# Patient Record
Sex: Male | Born: 1985 | Race: Black or African American | Hispanic: No | Marital: Single | Smoking: Former smoker
Health system: Southern US, Community
[De-identification: ages and names within clinical notes are randomized; demographics above are authoritative.]

## PROBLEM LIST (undated history)

## (undated) DIAGNOSIS — IMO0001 Reserved for inherently not codable concepts without codable children: Secondary | ICD-10-CM

## (undated) DIAGNOSIS — Z789 Other specified health status: Secondary | ICD-10-CM

## (undated) DIAGNOSIS — M5430 Sciatica, unspecified side: Secondary | ICD-10-CM

## (undated) DIAGNOSIS — F17200 Nicotine dependence, unspecified, uncomplicated: Secondary | ICD-10-CM

## (undated) DIAGNOSIS — F1090 Alcohol use, unspecified, uncomplicated: Secondary | ICD-10-CM

## (undated) HISTORY — PX: INGUINAL HERNIA REPAIR: SUR1180

## (undated) MED FILL — Dexamethasone Sodium Phosphate Inj 100 MG/10ML: INTRAMUSCULAR | Qty: 2 | Status: AC

---

## 2011-10-12 ENCOUNTER — Ambulatory Visit: Payer: Self-pay | Admitting: Radiology

## 2011-10-12 LAB — BASIC METABOLIC PANEL
Anion Gap: 8 (ref 7–16)
BUN: 6 mg/dL — ABNORMAL LOW (ref 7–18)
Co2: 28 mmol/L (ref 21–32)
Creatinine: 0.86 mg/dL (ref 0.60–1.30)
EGFR (African American): >60
EGFR (Non-African Amer.): >60
Glucose: 92 mg/dL (ref 65–99)
Osmolality: 277 (ref 275–301)
Potassium: 3.1 mmol/L — ABNORMAL LOW (ref 3.5–5.1)
Sodium: 140 mmol/L (ref 136–145)

## 2011-10-12 LAB — CBC WITH DIFFERENTIAL/PLATELET
Basophil %: 0.7 %
Eosinophil #: 0.2 10*3/uL (ref 0.0–0.7)
Eosinophil %: 3.4 %
HCT: 42.3 % (ref 40.0–52.0)
HGB: 14 g/dL (ref 13.0–18.0)
Lymphocyte %: 38.4 %
MCHC: 33.2 g/dL (ref 32.0–36.0)
MCV: 76 fL — ABNORMAL LOW (ref 80–100)
Monocyte %: 10.8 %
Neutrophil #: 2.9 10*3/uL (ref 1.4–6.5)
Neutrophil %: 46.7 %
WBC: 6.1 10*3/uL (ref 3.8–10.6)

## 2011-10-13 ENCOUNTER — Ambulatory Visit: Payer: Self-pay | Admitting: Surgery

## 2014-04-19 ENCOUNTER — Ambulatory Visit
Admit: 2014-04-19 | Disposition: A | Discharge status: Home / Self Care | Payer: Self-pay | Attending: Family Medicine | Admitting: Family Medicine

## 2014-04-24 NOTE — Op Note (Signed)
PATIENT NAME:  Tanner [name], Tanner [name] MR#:  [id number] DATE OF BIRTH:  April 07, 1985  DATE OF PROCEDURE:  10/13/2011  PREOPERATIVE DIAGNOSIS: Symptomatic right inguinal hernia.   POSTOPERATIVE DIAGNOSIS: Symptomatic right inguinal hernia.   PROCEDURE: Laparoscopic preperitoneal repair of a right inguinal hernia.   SURGEON: Phoebe Perch, MD  ASSISTANT: Marlyce Huge, MD   ANESTHESIA: General with endotracheal tube.   INDICATIONS: This is a patient with a large right inguinal hernia. Preoperatively we discussed rationale for surgery, the options of observation, risks of bleeding, infection, recurrence, ischemic orchitis, chronic pain syndrome, neuroma, and seroma, as well as hematoma. This was all reviewed for he and his mother in the preop holding area. They understood and agreed to proceed.   FINDINGS: Large long-standing indirect sac extending down into the scrotum, strong floor.   DESCRIPTION OF PROCEDURE: The patient was induced to general anesthesia, given IV antibiotics. He was prepped and draped in a sterile fashion. Marcaine was infiltrated in skin and subcutaneous tissues around the periumbilical area. An incision was made and dissection down to the right rectus fascia was performed. It was incised and the muscle retracted laterally. The [place] surgical dissecting balloon was placed followed by the [place] surgical structural balloon and then the preperitoneal space was insufflated under direct vision. Two midline 5 mm ports were placed. Shamar Engelmann's ligament was delineated. The lateral extent of dissection was determined. The cord was skeletonized of a very large and long-standing indirect sac, which was retracted cephalad and then into the preperitoneal space was placed a laterally scissored Atrium mesh held in place with the [place] surgical tacker avoiding the area of the nerve. The preperitoneal space was then desufflated under direct vision. All ports were removed. Fascial edges were approximated  with 0 Vicryl and 4-0 subcuticular Monocryl was used at all skin edges. Steri-Strips, Mastisol, and sterile dressings were placed.   The patient tolerated the procedure well. There were no complications. He was taken to the recovery room in stable condition to be discharged in the care of his family with followup in 10 days. ____________________________ Jerrol Banana Burt Knack, MD rec:slb D: 10/13/2011 12:51:57 ET T: 10/13/2011 12:57:26 ET JOB#: [id number]  cc: Jerrol Banana. Burt Knack, MD, <Dictator> Florene Glen MD ELECTRONICALLY SIGNED 10/13/2011 16:12

## 2014-04-24 NOTE — H&P (Signed)
PATIENT NAME:  Tanner [name], Tanner [name] MR#:  [id number] DATE OF BIRTH:  1985/03/30  DATE OF ADMISSION:  10/13/2011  CHIEF COMPLAINT: Right inguinal hernia.   HISTORY OF PRESENT ILLNESS: This is a patient with a history of bulge and some pain on the right side. No left-sided symptoms. No nausea, vomiting, fevers, or chills to suggest a history of incarceration. His pain and bulge has been present for approximately one year. He is here for elective laparoscopic preperitoneal repair of a right inguinal hernia.   PAST MEDICAL HISTORY: None.   PAST SURGICAL HISTORY: None.   MEDICATIONS: None.   ALLERGIES: None.   SOCIAL HISTORY: Patient drinks alcohol periodically, smokes less than a pack of cigarettes per day and is currently unemployed.   REVIEW OF SYSTEMS: Has been performed and documented in the chart; negative with the exception of that mentioned in the history of present illness.   PHYSICAL EXAMINATION:  GENERAL: Healthy muscular male patient.   HEENT: No scleral icterus.   NECK: No palpable neck nodes.   CHEST: Clear to auscultation.   CARDIAC: Regular rate and rhythm.   ABDOMEN: Soft, nontender.   EXTREMITIES: Without edema.   NEUROLOGIC: Grossly intact.   INTEGUMENT: No jaundice.   GENITOURINARY: Right inguinal hernia which is reducible and nontender.   LABORATORY, DIAGNOSTIC, AND RADIOLOGICAL DATA: Potassium 3.1 which has been replaced by oral 30 mEq. Hemoglobin and hematocrit is 14 and 42.   ASSESSMENT AND PLAN: This is a patient with a symptomatic right inguinal hernia requiring repair. Preoperatively we discussed rationale for surgery, the options of observation, the risks of bleeding, infection, recurrence, ischemic orchitis, chronic pain, syndrome and neuroma. This was all been reviewed for him and will be reviewed again in the preop holding area. He understands and agrees to proceed [place].  ____________________________ Jerrol Banana. Burt Knack,  MD rec:cms D: 10/12/2011 00:37:04 ET T: 10/13/2011 07:06:24 ET JOB#: [id number]  cc: Jerrol Banana. Burt Knack, MD, <Dictator>  Florene Glen MD ELECTRONICALLY SIGNED 10/13/2011 16:13

## 2016-01-07 ENCOUNTER — Emergency Department
Admission: EM | Admit: 2016-01-07 | Discharge: 2016-01-07 | Disposition: A | Discharge status: Home / Self Care | Payer: Self-pay | Attending: Emergency Medicine | Admitting: Emergency Medicine

## 2016-01-07 ENCOUNTER — Encounter: Payer: Self-pay | Admitting: *Deleted

## 2016-01-07 DIAGNOSIS — J069 Acute upper respiratory infection, unspecified: Secondary | ICD-10-CM | POA: Insufficient documentation

## 2016-01-07 DIAGNOSIS — B9789 Other viral agents as the cause of diseases classified elsewhere: Secondary | ICD-10-CM

## 2016-01-07 LAB — POCT RAPID STREP A: Streptococcus, Group A Screen (Direct): NEGATIVE

## 2016-01-07 MED ORDER — PSEUDOEPH-BROMPHEN-DM 30-2-10 MG/5ML PO SYRP: 10.0000 mL | ORAL_SOLUTION | Freq: Four times a day (QID) | ORAL | 0 refills | Status: DC | PRN

## 2016-01-07 MED ORDER — FLUTICASONE PROPIONATE 50 MCG/ACT NA SUSP: 1.0000 | Freq: Two times a day (BID) | NASAL | 0 refills | Status: DC

## 2016-01-07 MED ORDER — CETIRIZINE HCL 10 MG PO TABS: 10.0000 mg | ORAL_TABLET | Freq: Every day | ORAL | 0 refills | Status: DC

## 2016-01-07 NOTE — ED Provider Notes (Signed)
[facility] Emergency Department Provider Note  ____________________________________________  Time seen: Approximately 7:15 PM  I have reviewed the triage vital signs and the nursing notes.   HISTORY  Chief Complaint Cough and Sore Throat    HPI Tanner [name] is a 31 y.o. male who presents emergency department complaining of nasal congestion, sore throat, cough, diarrhea 7 days. Patient reports that symptoms began insidiously with some nasal congestion and diarrhea. Patient reports after 2 days diarrhea completely resolved. Patient states that the nasal congestion has also had sore throat and coughing. Patient denies any frank fevers. He denies any emesis or nausea. Patient denies any headache, visual changes, neck stiffness, chest pain, shortness of breath. Patient is tried 2-3 doses of over-the-counter cold and cough medication with moderate relief. No other symptoms or complaints. No other medications prior to arrival.   History reviewed. No pertinent past medical history.  There are no active problems to display for this patient.   History reviewed. No pertinent surgical history.  Prior to Admission medications   Medication Sig Start Date End Date Taking? Authorizing Provider  brompheniramine-pseudoephedrine-DM 30-2-10 MG/5ML syrup Take 10 mLs by mouth 4 (four) times daily as needed. 01/07/16   Charline Bills Cuthriell, PA-C  cetirizine (ZYRTEC) 10 MG tablet Take 1 tablet (10 mg total) by mouth daily. 01/07/16   Charline Bills Cuthriell, PA-C  fluticasone (FLONASE) 50 MCG/ACT nasal spray Place 1 spray into both nostrils 2 (two) times daily. 01/07/16   Charline Bills Cuthriell, PA-C    Allergies Patient has no known allergies.  No family history on file.  Social History Social History  Substance Use Topics  . Smoking status: Never Smoker  . Smokeless tobacco: Never Used  . Alcohol use No     Review of Systems  Constitutional: No  fever/chills Eyes: No visual changes. No discharge ENT: Positive for nasal congestion and sore throat  Cardiovascular: no chest pain. Respiratory: Positive cough. No SOB. Gastrointestinal: No abdominal pain.  No nausea, no vomiting.  Positive for diarrhea that has resolved.  No constipation. Musculoskeletal: Negative for musculoskeletal pain. Skin: Negative for rash, abrasions, lacerations, ecchymosis. Neurological: Negative for headaches, focal weakness or numbness. 10-point ROS otherwise negative.  ____________________________________________   PHYSICAL EXAM:  VITAL SIGNS: ED Triage Vitals  Enc Vitals Group     BP 01/07/16 1832 (!) 142/82     Pulse Rate 01/07/16 1832 (!) 101     Resp --      Temp 01/07/16 1832 98.6 F (37 C)     Temp Source 01/07/16 1832 Oral     SpO2 01/07/16 1832 100 %     Weight 01/07/16 1834 270 lb (122.5 kg)     Height 01/07/16 1834 6' (1.829 m)     Head Circumference --      Peak Flow --      Pain Score 01/07/16 1834 5     Pain Loc --      Pain Edu? --      Excl. in [place]? --      Constitutional: Alert and oriented. Well appearing and in no acute distress. Eyes: Conjunctivae are normal. PERRL. EOMI. Head: Atraumatic. ENT:      Ears: EACs and TMs unremarkable bilaterally      Nose: Moderate clear congestion/rhinnorhea.      Mouth/Throat: Mucous membranes are moist. Oropharynx is mildly erythematous but nonedematous. Tonsils are mildly edematous but nonerythematous and no exudates present. Uvula is midline Neck: No stridor. Neck is supple  with full range of motion Hematological/Lymphatic/Immunilogical: No cervical lymphadenopathy. Cardiovascular: Normal rate, regular rhythm. Normal S1 and S2.  Good peripheral circulation. Respiratory: Normal respiratory effort without tachypnea or retractions. Lungs CTAB. Good air entry to the bases with no decreased or absent breath sounds. Gastrointestinal: Bowel sounds 4 quadrants. Soft and nontender to  palpation. No guarding or rigidity. No palpable masses. No distention.  Musculoskeletal: Full range of motion to all extremities. No gross deformities appreciated. Neurologic:  Normal speech and language. No gross focal neurologic deficits are appreciated.  Skin:  Skin is warm, dry and intact. No rash noted. Psychiatric: Mood and affect are normal. Speech and behavior are normal. Patient exhibits appropriate insight and judgement.   ____________________________________________   LABS (all labs ordered are listed, but only abnormal results are displayed)  Labs Reviewed  POCT RAPID STREP A   ____________________________________________  EKG   ____________________________________________  RADIOLOGY   No results found.  ____________________________________________    PROCEDURES  Procedure(s) performed:    Procedures    Medications - No data to display   ____________________________________________   INITIAL IMPRESSION / ASSESSMENT AND PLAN / ED COURSE  Pertinent labs & imaging results that were available during my care of the patient were reviewed by me and considered in my medical decision making (see chart for details).  Review of the [place] CSRS was performed in accordance of the [place] prior to dispensing any controlled drugs.  Clinical Course     Patient's diagnosis is consistent with Viral upper respiratory infection with cough. This time, patient's symptoms are consistent with viral infection and no imaging or labs or edema necessary.. Patient will be discharged home with prescriptions for symptom control medications to include Flonase, Zyrtec, cough medication. Patient is to follow up with primary care as needed or otherwise directed. Patient is given ED precautions to return to the ED for any worsening or new symptoms.     ____________________________________________  FINAL CLINICAL IMPRESSION(S) / ED DIAGNOSES  Final diagnoses:  Viral URI with cough       NEW MEDICATIONS STARTED DURING THIS VISIT:  Discharge Medication List as of 01/07/2016  7:15 PM    START taking these medications   Details  brompheniramine-pseudoephedrine-DM 30-2-10 MG/5ML syrup Take 10 mLs by mouth 4 (four) times daily as needed., Starting Tue 01/07/2016, Print    cetirizine (ZYRTEC) 10 MG tablet Take 1 tablet (10 mg total) by mouth daily., Starting Tue 01/07/2016, Print    fluticasone (FLONASE) 50 MCG/ACT nasal spray Place 1 spray into both nostrils 2 (two) times daily., Starting Tue 01/07/2016, Print            This chart was dictated using voice recognition software/Dragon. Despite best efforts to proofread, errors can occur which can change the meaning. Any change was purely unintentional.    Darletta Moll, PA-C 01/07/16 2038    Nena Polio, MD 01/07/16 [phone]

## 2016-01-07 NOTE — ED Notes (Signed)
Presents with sinus pressure and sore throat  States he developed some diarrhea with body aches about 5 days ago  Denies any diarrhea at present  But now having increased sinus pressure with sore throat  Afebrile on arrival

## 2016-01-07 NOTE — ED Notes (Signed)
POCT Rapid Strep A sent for analysis.

## 2016-01-07 NOTE — ED Triage Notes (Signed)
Pt has a cough, sore throat and sinus pressure.  Pt alert.

## 2016-03-30 ENCOUNTER — Encounter: Payer: Self-pay | Admitting: *Deleted

## 2016-03-30 ENCOUNTER — Emergency Department
Admission: EM | Admit: 2016-03-30 | Discharge: 2016-03-30 | Disposition: A | Discharge status: Home / Self Care | Payer: Self-pay | Attending: Emergency Medicine | Admitting: Emergency Medicine

## 2016-03-30 DIAGNOSIS — J069 Acute upper respiratory infection, unspecified: Secondary | ICD-10-CM | POA: Insufficient documentation

## 2016-03-30 DIAGNOSIS — B9789 Other viral agents as the cause of diseases classified elsewhere: Secondary | ICD-10-CM

## 2016-03-30 MED ORDER — PSEUDOEPH-BROMPHEN-DM 30-2-10 MG/5ML PO SYRP: 5.0000 mL | ORAL_SOLUTION | Freq: Four times a day (QID) | ORAL | 0 refills | Status: DC | PRN

## 2016-03-30 MED ORDER — IBUPROFEN 600 MG PO TABS: 600.0000 mg | ORAL_TABLET | Freq: Three times a day (TID) | ORAL | 0 refills | Status: DC | PRN

## 2016-03-30 NOTE — ED Notes (Signed)
See triage note  States he developed a headache with slight cough last pm  Describes headaches as throbbing headache  Did have relief with Aleve last pm  Low grade fever on arrival

## 2016-03-30 NOTE — ED Provider Notes (Signed)
[facility] Emergency Department Provider Note   ____________________________________________   First MD Initiated Contact with Patient 03/30/16 [phone]     (approximate)  I have reviewed the triage vital signs and the nursing notes.   HISTORY  Chief Complaint Headache and Cough    HPI Tanner [name] is a 31 y.o. male patient complain of a frontal headache, body aches, nasal congestion, and cough which started yesterday. Patient stated to over-the-counter Aleve with some mild relief of the body aches. Patient states cough is nonproductive.Patient rates his pain as 8/10. Patient described his pain as "achy". No other palliative measures for his complaint.   History reviewed. No pertinent past medical history.  There are no active problems to display for this patient.   History reviewed. No pertinent surgical history.  Prior to Admission medications   Medication Sig Start Date End Date Taking? Authorizing Provider  brompheniramine-pseudoephedrine-DM 30-2-10 MG/5ML syrup Take 10 mLs by mouth 4 (four) times daily as needed. 01/07/16   Charline Bills Cuthriell, PA-C  brompheniramine-pseudoephedrine-DM 30-2-10 MG/5ML syrup Take 5 mLs by mouth 4 (four) times daily as needed. 03/30/16   Sable Feil, PA-C  cetirizine (ZYRTEC) 10 MG tablet Take 1 tablet (10 mg total) by mouth daily. 01/07/16   Charline Bills Cuthriell, PA-C  fluticasone (FLONASE) 50 MCG/ACT nasal spray Place 1 spray into both nostrils 2 (two) times daily. 01/07/16   Charline Bills Cuthriell, PA-C  ibuprofen (ADVIL,MOTRIN) 600 MG tablet Take 1 tablet (600 mg total) by mouth every 8 (eight) hours as needed. 03/30/16   Sable Feil, PA-C    Allergies Patient has no known allergies.  History reviewed. No pertinent family history.  Social History Social History  Substance Use Topics  . Smoking status: Never Smoker  . Smokeless tobacco: Never Used  . Alcohol use No    Review of  Systems Constitutional: Fever and body aches Eyes: No visual changes. ENT: No sore throat. Nasal congestion Cardiovascular: Denies chest pain. Respiratory: Denies shortness of breath. Nonproductive cough Gastrointestinal: No abdominal pain.  No nausea, no vomiting.  No diarrhea.  No constipation. Genitourinary: Negative for dysuria. Musculoskeletal: Negative for back pain. Skin: Negative for rash. Neurological: Negative for headaches, focal weakness or numbness.    ____________________________________________   PHYSICAL EXAM:  VITAL SIGNS: ED Triage Vitals  Enc Vitals Group     BP 03/30/16 0857 138/74     Pulse Rate 03/30/16 0857 (!) 104     Resp 03/30/16 0857 18     Temp 03/30/16 0857 (!) 100.5 F (38.1 C)     Temp Source 03/30/16 0857 Oral     SpO2 03/30/16 0857 95 %     Weight 03/30/16 0853 270 lb (122.5 kg)     Height 03/30/16 0853 6' (1.829 m)     Head Circumference --      Peak Flow --      Pain Score 03/30/16 0853 8     Pain Loc --      Pain Edu? --      Excl. in [place]? --     Constitutional: Alert and oriented. Well appearing and in no acute distress. Temperature 100.5 Eyes: Conjunctivae are normal. PERRL. EOMI. Head: Atraumatic. Nose:Edematous nasal turbinates clear rhinorrhea.  Mouth/Throat: Mucous membranes are moist.  Oropharynx non-erythematous. Postnasal drainage Neck: No stridor.  No cervical spine tenderness to palpation. Hematological/Lymphatic/Immunilogical: No cervical lymphadenopathy. Cardiovascular: Normal rate, regular rhythm. Grossly normal heart sounds.  Good peripheral circulation. Respiratory: Normal respiratory  effort.  No retractions. Lungs CTAB. Nonproductive cough Gastrointestinal: Soft and nontender. No distention. No abdominal bruits. No CVA tenderness. Musculoskeletal: No lower extremity tenderness nor edema.  No joint effusions. Neurologic:  Normal speech and language. No gross focal neurologic deficits are appreciated. No gait  instability. Skin:  Skin is warm, dry and intact. No rash noted. Psychiatric: Mood and affect are normal. Speech and behavior are normal.  ____________________________________________   LABS (all labs ordered are listed, but only abnormal results are displayed)  Labs Reviewed - No data to display ____________________________________________  EKG   ____________________________________________  RADIOLOGY   ____________________________________________   PROCEDURES  Procedure(s) performed: None  Procedures  Critical Care performed: No  ____________________________________________   INITIAL IMPRESSION / ASSESSMENT AND PLAN / ED COURSE  Pertinent labs & imaging results that were available during my care of the patient were reviewed by me and considered in my medical decision making (see chart for details).  Viral illness. Patient given discharge care instructions. Patient given prescriptions for but felt DM and ibuprofen. Patient given a work no. Patient [facility].      ____________________________________________   FINAL CLINICAL IMPRESSION(S) / ED DIAGNOSES  Final diagnoses:  Viral URI with cough      NEW MEDICATIONS STARTED DURING THIS VISIT:  New Prescriptions   BROMPHENIRAMINE-PSEUDOEPHEDRINE-DM 30-2-10 MG/5ML SYRUP    Take 5 mLs by mouth 4 (four) times daily as needed.   IBUPROFEN (ADVIL,MOTRIN) 600 MG TABLET    Take 1 tablet (600 mg total) by mouth every 8 (eight) hours as needed.     Note:  This document was prepared using Dragon voice recognition software and may include unintentional dictation errors.    Sable Feil, PA-C 03/30/16 0920    Orbie Pyo, MD 03/30/16 [phone]

## 2016-03-30 NOTE — ED Triage Notes (Signed)
States last night he developed flu like symptoms, headache and cough, states he took aleve OTC, awake and alert

## 2016-10-05 ENCOUNTER — Emergency Department: Payer: Self-pay

## 2016-10-05 ENCOUNTER — Emergency Department
Admission: EM | Admit: 2016-10-05 | Discharge: 2016-10-05 | Disposition: A | Discharge status: Home / Self Care | Payer: Self-pay | Attending: Emergency Medicine | Admitting: Emergency Medicine

## 2016-10-05 ENCOUNTER — Encounter: Payer: Self-pay | Admitting: Emergency Medicine

## 2016-10-05 DIAGNOSIS — Z79899 Other long term (current) drug therapy: Secondary | ICD-10-CM | POA: Insufficient documentation

## 2016-10-05 DIAGNOSIS — W01198A Fall on same level from slipping, tripping and stumbling with subsequent striking against other object, initial encounter: Secondary | ICD-10-CM | POA: Insufficient documentation

## 2016-10-05 DIAGNOSIS — Y929 Unspecified place or not applicable: Secondary | ICD-10-CM | POA: Insufficient documentation

## 2016-10-05 DIAGNOSIS — S63501A Unspecified sprain of right wrist, initial encounter: Secondary | ICD-10-CM | POA: Insufficient documentation

## 2016-10-05 DIAGNOSIS — Y999 Unspecified external cause status: Secondary | ICD-10-CM | POA: Insufficient documentation

## 2016-10-05 DIAGNOSIS — Y9389 Activity, other specified: Secondary | ICD-10-CM | POA: Insufficient documentation

## 2016-10-05 MED ORDER — IBUPROFEN 800 MG PO TABS
800.0000 mg | ORAL_TABLET | Freq: Once | ORAL | Status: AC
  Administered 2016-10-05: 800 mg via ORAL
  Filled 2016-10-05: qty 1

## 2016-10-05 MED ORDER — IBUPROFEN 800 MG PO TABS: 800.0000 mg | ORAL_TABLET | Freq: Three times a day (TID) | ORAL | 0 refills | Status: DC | PRN

## 2016-10-05 NOTE — Discharge Instructions (Signed)
Please take ibuprofen as prescribed. Wear Velcro wrist brace. Rest ice and elevate the wrist. Follow-up with orthopedics if no improvement in one week.

## 2016-10-05 NOTE — ED Triage Notes (Signed)
Pt reports fell today and braced self with right arm. Pt reports right arm pain. Pt presents with swelling to right hand, fingertips cool to touch, cap refill brisk, radial pulse palpated, sensation intact. Pt ambulatory to triage. No apparent distress.

## 2016-10-05 NOTE — ED Provider Notes (Signed)
[facility] Provider Note   CSN: [id number] Arrival date & time: 10/05/16  [place]     History   Chief Complaint Chief Complaint  Patient presents with  . Wrist Injury    HPI Tanner [name] is a 31 y.o. male presents to the emergency department for evaluation of right wrist injury. Just prior to arrival he fell on his outstretched right hand. Patient has wrist pain along the distal radial ulnar joint and radiocarpal joint. Pain is moderate. His 90 medications for pain. No numbness or tingling. Has some pain along the forearm, midshaft. Denies any other injury to his body. Follows mechanical.  HPI  History reviewed. No pertinent past medical history.  There are no active problems to display for this patient.   No past surgical history on file.     Home Medications    Prior to Admission medications   Medication Sig Start Date End Date Taking? Authorizing Provider  brompheniramine-pseudoephedrine-DM 30-2-10 MG/5ML syrup Take 10 mLs by mouth 4 (four) times daily as needed. 01/07/16   Cuthriell, Charline Bills, PA-C  brompheniramine-pseudoephedrine-DM 30-2-10 MG/5ML syrup Take 5 mLs by mouth 4 (four) times daily as needed. 03/30/16   Sable Feil, PA-C  cetirizine (ZYRTEC) 10 MG tablet Take 1 tablet (10 mg total) by mouth daily. 01/07/16   Cuthriell, Charline Bills, PA-C  fluticasone (FLONASE) 50 MCG/ACT nasal spray Place 1 spray into both nostrils 2 (two) times daily. 01/07/16   Cuthriell, Charline Bills, PA-C  ibuprofen (ADVIL,MOTRIN) 800 MG tablet Take 1 tablet (800 mg total) by mouth every 8 (eight) hours as needed. 10/05/16   Duanne Guess, PA-C    Family History No family history on file.  Social History Social History  Substance Use Topics  . Smoking status: Never Smoker  . Smokeless tobacco: Never Used  . Alcohol use No     Allergies   Patient has no known allergies.   Review of Systems Review of Systems  Constitutional: Negative for fever.    Respiratory: Negative for shortness of breath.   Cardiovascular: Negative for chest pain.  Gastrointestinal: Negative for abdominal pain.  Genitourinary: Negative for difficulty urinating, dysuria and urgency.  Musculoskeletal: Positive for arthralgias. Negative for back pain and myalgias.  Skin: Negative for rash.  Neurological: Negative for dizziness and headaches.     Physical Exam Updated Vital Signs BP (!) 143/99 (BP Location: Left Arm)   Pulse 91   Temp 98.9 F (37.2 C) (Oral)   Resp 18   Ht 6' (1.829 m)   Wt 92.5 kg (204 lb)   SpO2 100%   BMI 27.67 kg/m   Physical Exam  Constitutional: He is oriented to person, place, and time. He appears well-developed and well-nourished.  HENT:  Head: Normocephalic and atraumatic.  Eyes: Conjunctivae are normal.  Neck: Normal range of motion.  Cardiovascular: Normal rate.   Pulmonary/Chest: Effort normal. No respiratory distress.  Musculoskeletal: Normal range of motion.  Examination of the right upper shoulder shows mild swelling along the distal radial ulnar joint and metacarpophalangeal joint. He is tender to palpation along the distal radius, distal as well as the midshaft of the forearm. No skin break down noted. Normal sensation throughout the digits.  Neurological: He is alert and oriented to person, place, and time.  Skin: Skin is warm. No rash noted.  Psychiatric: He has a normal mood and affect. His behavior is normal. Thought content normal.     ED Treatments / Results  Labs (all labs  ordered are listed, but only abnormal results are displayed) Labs Reviewed - No data to display  EKG  EKG Interpretation None       Radiology Dg Forearm Right  Result Date: 10/05/2016 CLINICAL DATA:  Status post fall EXAM: RIGHT FOREARM - 2 VIEW COMPARISON:  None. FINDINGS: There is no evidence of fracture or other focal bone lesions. Soft tissues are unremarkable. IMPRESSION: No acute osseous injury of the right forearm.  Electronically Signed   By: Kathreen Devoid   On: 10/05/2016 19:49   Dg Wrist Complete Right  Result Date: 10/05/2016 CLINICAL DATA:  Fall onto outstretched arm today. Right wrist pain. Initial encounter. EXAM: RIGHT WRIST - COMPLETE 3+ VIEW COMPARISON:  None. FINDINGS: There is no evidence of fracture or dislocation. There is no evidence of arthropathy or other focal bone abnormality. Soft tissues are unremarkable. IMPRESSION: Negative. Electronically Signed   By: Earle Gell M.D.   On: 10/05/2016 19:40    Procedures Procedures (including critical care time) SPLINT APPLICATION Date/Time: 7:58 PM Authorized by: Feliberto Gottron Consent: Verbal consent obtained. Risks and benefits: risks, benefits and alternatives were discussed Consent given by: patient Splint applied by: Physician assistant Location details: Right wrist  Splint type: Velcro wrist  Supplies used: Prefabricated Velcro wrist  Post-procedure: The splinted body part was neurovascularly unchanged following the procedure. Patient tolerance: Patient tolerated the procedure well with no immediate complications.     Medications Ordered in ED Medications  ibuprofen (ADVIL,MOTRIN) tablet 800 mg (800 mg Oral Given 10/05/16 1922)     Initial Impression / Assessment and Plan / ED Course  I have reviewed the triage vital signs and the nursing notes.  Pertinent labs & imaging results that were available during my care of the patient were reviewed by me and considered in my medical decision making (see chart for details).     31 year old male with right wrist sprain. Is placed into a Velcro wrist brace. X-ray show no evidence of acute bony abnormality. He is given prescription for ibuprofen.    Final Clinical Impressions(s) / ED Diagnoses   Final diagnoses:  Wrist sprain, right, initial encounter    New Prescriptions New Prescriptions   IBUPROFEN (ADVIL,MOTRIN) 800 MG TABLET    Take 1 tablet (800 mg total) by  mouth every 8 (eight) hours as needed.     Duanne Guess, PA-C 10/05/16 1959    Schuyler Amor, MD 10/05/16 2209

## 2018-12-04 ENCOUNTER — Other Ambulatory Visit: Payer: Self-pay

## 2018-12-04 ENCOUNTER — Encounter: Payer: Self-pay | Admitting: Intensive Care

## 2018-12-04 ENCOUNTER — Emergency Department
Admission: EM | Admit: 2018-12-04 | Discharge: 2018-12-04 | Disposition: A | Discharge status: Home / Self Care | Payer: Self-pay | Attending: Emergency Medicine | Admitting: Emergency Medicine

## 2018-12-04 DIAGNOSIS — R6883 Chills (without fever): Secondary | ICD-10-CM

## 2018-12-04 DIAGNOSIS — Z79899 Other long term (current) drug therapy: Secondary | ICD-10-CM | POA: Insufficient documentation

## 2018-12-04 DIAGNOSIS — B349 Viral infection, unspecified: Secondary | ICD-10-CM | POA: Insufficient documentation

## 2018-12-04 DIAGNOSIS — F17290 Nicotine dependence, other tobacco product, uncomplicated: Secondary | ICD-10-CM | POA: Insufficient documentation

## 2018-12-04 LAB — URINALYSIS, COMPLETE (UACMP) WITH MICROSCOPIC
Bacteria, UA: NONE SEEN
Bilirubin Urine: NEGATIVE
Glucose, UA: NEGATIVE mg/dL
Hgb urine dipstick: NEGATIVE
Ketones, ur: NEGATIVE mg/dL
Leukocytes,Ua: NEGATIVE
Nitrite: NEGATIVE
Protein, ur: NEGATIVE mg/dL
Specific Gravity, Urine: 1.002 — ABNORMAL LOW (ref 1.005–1.030)
Squamous Epithelial / LPF: NONE SEEN (ref 0–5)
pH: 6 (ref 5.0–8.0)

## 2018-12-04 LAB — COMPREHENSIVE METABOLIC PANEL
ALT: 21 U/L (ref 0–44)
AST: 24 U/L (ref 15–41)
Albumin: 4.1 g/dL (ref 3.5–5.0)
Alkaline Phosphatase: 66 U/L (ref 38–126)
Anion gap: 13 (ref 5–15)
BUN: 5 mg/dL — ABNORMAL LOW (ref 6–20)
CO2: 24 mmol/L (ref 22–32)
Calcium: 9.4 mg/dL (ref 8.9–10.3)
Chloride: 99 mmol/L (ref 98–111)
Creatinine, Ser: 0.9 mg/dL (ref 0.61–1.24)
GFR calc Af Amer: >60 mL/min (ref >60)
GFR calc non Af Amer: >60 mL/min (ref >60)
Glucose, Bld: 114 mg/dL — ABNORMAL HIGH (ref 70–99)
Potassium: 3.2 mmol/L — ABNORMAL LOW (ref 3.5–5.1)
Sodium: 136 mmol/L (ref 135–145)
Total Bilirubin: 1.4 mg/dL — ABNORMAL HIGH (ref 0.3–1.2)
Total Protein: 8.4 g/dL — ABNORMAL HIGH (ref 6.5–8.1)

## 2018-12-04 LAB — CBC
HCT: 43.8 % (ref 39.0–52.0)
Hemoglobin: 14.8 g/dL (ref 13.0–17.0)
MCH: 26.3 pg (ref 26.0–34.0)
MCHC: 33.8 g/dL (ref 30.0–36.0)
MCV: 77.8 fL — ABNORMAL LOW (ref 80.0–100.0)
Platelets: 323 10*3/uL (ref 150–400)
RBC: 5.63 MIL/uL (ref 4.22–5.81)
RDW: 14.8 % (ref 11.5–15.5)
WBC: 8.5 10*3/uL (ref 4.0–10.5)
nRBC: 0 % (ref 0.0–0.2)

## 2018-12-04 LAB — GLUCOSE, CAPILLARY: Glucose-Capillary: 83 mg/dL (ref 70–99)

## 2018-12-04 LAB — LIPASE, BLOOD: Lipase: 30 U/L (ref 11–51)

## 2018-12-04 NOTE — ED Provider Notes (Signed)
[facility] Emergency Department Provider Note   ____________________________________________    I have reviewed the triage vital signs and the nursing notes.   HISTORY  Chief Complaint Polydipsia, Numbness, Chills, and Diarrhea     HPI Tanner [name] is a 33 y.o. male who reports that when he woke up this morning felt chills and a tingling across the bridge of his nose.  He also had one episode of loose stool and has felt thirsty more than usual today.  He does not know that if he has had a fever.  No known exposure to novel coronavirus.  Has not take anything for this.  Now he is feeling significantly better.  No nausea or vomiting.  No sick contacts, no recent travel  History reviewed. No pertinent past medical history.  There are no active problems to display for this patient.   History reviewed. No pertinent surgical history.  Prior to Admission medications   Medication Sig Start Date End Date Taking? Authorizing Provider  brompheniramine-pseudoephedrine-DM 30-2-10 MG/5ML syrup Take 10 mLs by mouth 4 (four) times daily as needed. 01/07/16   Cuthriell, Charline Bills, PA-C  brompheniramine-pseudoephedrine-DM 30-2-10 MG/5ML syrup Take 5 mLs by mouth 4 (four) times daily as needed. 03/30/16   Sable Feil, PA-C  cetirizine (ZYRTEC) 10 MG tablet Take 1 tablet (10 mg total) by mouth daily. 01/07/16   Cuthriell, Charline Bills, PA-C  fluticasone (FLONASE) 50 MCG/ACT nasal spray Place 1 spray into both nostrils 2 (two) times daily. 01/07/16   Cuthriell, Charline Bills, PA-C  ibuprofen (ADVIL,MOTRIN) 800 MG tablet Take 1 tablet (800 mg total) by mouth every 8 (eight) hours as needed. 10/05/16   Duanne Guess, PA-C     Allergies Patient has no known allergies.  History reviewed. No pertinent family history.  Social History Social History   Tobacco Use  . Smoking status: Current Every Day Smoker    Types: E-cigarettes  . Smokeless tobacco: Never  Used  Substance Use Topics  . Alcohol use: Yes    Alcohol/week: 24.0 standard drinks    Types: 24 Cans of beer per week  . Drug use: Never    Review of Systems  Constitutional: Positive chills Eyes: No visual changes.  ENT: Mild sore throat Cardiovascular: Denies chest pain. Respiratory: Denies shortness of breath. Gastrointestinal: No abdominal pain. Genitourinary: Negative for dysuria. Musculoskeletal: No myalgias Skin: Negative for rash. Neurological: Negative for headaches or weakness   ____________________________________________   PHYSICAL EXAM:  VITAL SIGNS: ED Triage Vitals  Enc Vitals Group     BP 12/04/18 1721 (!) 164/105     Pulse Rate 12/04/18 1721 77     Resp 12/04/18 1721 16     Temp 12/04/18 1721 97.8 F (36.6 C)     Temp Source 12/04/18 1721 Oral     SpO2 12/04/18 1721 100 %     Weight 12/04/18 1722 108.9 kg (240 lb)     Height 12/04/18 1722 1.829 m (6')     Head Circumference --      Peak Flow --      Pain Score 12/04/18 1722 1     Pain Loc --      Pain Edu? --      Excl. in [place]? --     Constitutional: Alert and oriented Eyes: Conjunctivae are normal.  Head: Atraumatic. Nose: No congestion/rhinnorhea.  Cardiovascular: Normal rate, regular rhythm. Grossly normal heart sounds.  Good peripheral circulation. Respiratory: Normal respiratory effort.  No retractions. Lungs CTAB. Gastrointestinal: Soft and nontender. No distention.    Musculoskeletal:   Warm and well perfused Neurologic:  Normal speech and language. No gross focal neurologic deficits are appreciated.  Skin:  Skin is warm, dry and intact. No rash noted. Psychiatric: Mood and affect are normal. Speech and behavior are normal.  ____________________________________________   LABS (all labs ordered are listed, but only abnormal results are displayed)  Labs Reviewed  COMPREHENSIVE METABOLIC PANEL - Abnormal; Notable for the following components:      Result Value   Potassium 3.2  (*)    Glucose, Bld 114 (*)    BUN 5 (*)    Total Protein 8.4 (*)    Total Bilirubin 1.4 (*)    All other components within normal limits  CBC - Abnormal; Notable for the following components:   MCV 77.8 (*)    All other components within normal limits  URINALYSIS, COMPLETE (UACMP) WITH MICROSCOPIC - Abnormal; Notable for the following components:   Color, Urine STRAW (*)    APPearance CLEAR (*)    Specific Gravity, Urine 1.002 (*)    All other components within normal limits  GLUCOSE, CAPILLARY  LIPASE, BLOOD   ____________________________________________  EKG  None ____________________________________________  RADIOLOGY  None ____________________________________________   PROCEDURES  Procedure(s) performed: No  Procedures   Critical Care performed: No ____________________________________________   INITIAL IMPRESSION / ASSESSMENT AND PLAN / ED COURSE  Pertinent labs & imaging results that were available during my care of the patient were reviewed by me and considered in my medical decision making (see chart for details).  Patient presents with varied symptoms, suspicious for possible viral illness.  No loss of taste or smell, no cough or shortness of breath.  Lab work is overall reassuring, exam is normal.  Recommend supportive care at this time, outpatient follow-up as needed    ____________________________________________   FINAL CLINICAL IMPRESSION(S) / ED DIAGNOSES  Final diagnoses:  Chills  Viral illness        Note:  This document was prepared using Dragon voice recognition software and may include unintentional dictation errors.   Lavonia Drafts, MD 12/04/18 [phone]

## 2018-12-04 NOTE — ED Triage Notes (Addendum)
Patient reports thirst, abd tenderness, two episodes of diarrhea, facial numbness, cold toes and fingers, and chills that started today. Blood sugar normal in triage. NAD noted. No weakness.

## 2020-01-11 ENCOUNTER — Encounter: Payer: Self-pay | Admitting: *Deleted

## 2020-01-11 ENCOUNTER — Other Ambulatory Visit: Payer: Self-pay

## 2020-01-11 ENCOUNTER — Emergency Department
Admission: EM | Admit: 2020-01-11 | Discharge: 2020-01-11 | Disposition: A | Discharge status: Home / Self Care | Payer: Commercial Managed Care - PPO | Attending: Emergency Medicine | Admitting: Emergency Medicine

## 2020-01-11 ENCOUNTER — Emergency Department: Payer: Commercial Managed Care - PPO

## 2020-01-11 DIAGNOSIS — F17290 Nicotine dependence, other tobacco product, uncomplicated: Secondary | ICD-10-CM | POA: Insufficient documentation

## 2020-01-11 DIAGNOSIS — R0789 Other chest pain: Secondary | ICD-10-CM | POA: Insufficient documentation

## 2020-01-11 LAB — CBC
HCT: 42.5 % (ref 39.0–52.0)
Hemoglobin: 13.9 g/dL (ref 13.0–17.0)
MCH: 26.3 pg (ref 26.0–34.0)
MCHC: 32.7 g/dL (ref 30.0–36.0)
MCV: 80.3 fL (ref 80.0–100.0)
Platelets: 294 10*3/uL (ref 150–400)
RBC: 5.29 MIL/uL (ref 4.22–5.81)
RDW: 13.9 % (ref 11.5–15.5)
WBC: 7.1 10*3/uL (ref 4.0–10.5)
nRBC: 0 % (ref 0.0–0.2)

## 2020-01-11 LAB — BASIC METABOLIC PANEL
Anion gap: 11 (ref 5–15)
BUN: 13 mg/dL (ref 6–20)
CO2: 25 mmol/L (ref 22–32)
Calcium: 9.5 mg/dL (ref 8.9–10.3)
Chloride: 99 mmol/L (ref 98–111)
Creatinine, Ser: 0.91 mg/dL (ref 0.61–1.24)
GFR, Estimated: >60 mL/min (ref >60)
Glucose, Bld: 109 mg/dL — ABNORMAL HIGH (ref 70–99)
Potassium: 3.7 mmol/L (ref 3.5–5.1)
Sodium: 135 mmol/L (ref 135–145)

## 2020-01-11 LAB — TROPONIN I (HIGH SENSITIVITY): Troponin I (High Sensitivity): 4 ng/L (ref <18)

## 2020-01-11 NOTE — ED Provider Notes (Signed)
[facility] Emergency Department Provider Note   ____________________________________________   Event Date/Time   First MD Initiated Contact with Patient 01/11/20 0206     (approximate)  I have reviewed the triage vital signs and the nursing notes.   HISTORY  Chief Complaint Dizziness and Hypertension    HPI Tanner [name] is a 35 y.o. male with history of tobacco use who presents to the emergency department with an episode of lightheadedness and central chest pain that he describes as a soreness that lasted for about 30 to 60 minutes while at work today.  No radiation of pain.  No aggravating or alleviating factors.  Symptoms have now resolved.  States this is happened to him at work before.  They checked his blood pressure and it was in the 170/100s.  He did have a similar episode 6 days ago and his girlfriend told him it was a panic attack.  That time he had left-sided chest discomfort.  States he could feel his heart beating.  He felt short of breath.  Symptoms resolved on their own.  He did not see a doctor at that time.   No family h/o CAD.  Denies history of hypertension, diabetes, hyperlipidemia.  Does not have a primary care doctor for follow-up.  No history of PE, DVT, exogenous estrogen use, recent fractures, surgery, trauma, hospitalization, prolonged travel or other immobilization. No lower extremity swelling or pain. No calf tenderness.  No fever, body aches.  Did have cough and sore throat 2 days ago.  Unvaccinated for COVID and flu.    History reviewed. No pertinent past medical history.  There are no problems to display for this patient.   History reviewed. No pertinent surgical history.  Prior to Admission medications   Medication Sig Start Date End Date Taking? Authorizing Provider  brompheniramine-pseudoephedrine-DM 30-2-10 MG/5ML syrup Take 10 mLs by mouth 4 (four) times daily as needed. 01/07/16   Cuthriell, Delorise Royals,  PA-C  brompheniramine-pseudoephedrine-DM 30-2-10 MG/5ML syrup Take 5 mLs by mouth 4 (four) times daily as needed. 03/30/16   Joni Reining, PA-C  cetirizine (ZYRTEC) 10 MG tablet Take 1 tablet (10 mg total) by mouth daily. 01/07/16   Cuthriell, Delorise Royals, PA-C  fluticasone (FLONASE) 50 MCG/ACT nasal spray Place 1 spray into both nostrils 2 (two) times daily. 01/07/16   Cuthriell, Delorise Royals, PA-C  ibuprofen (ADVIL,MOTRIN) 800 MG tablet Take 1 tablet (800 mg total) by mouth every 8 (eight) hours as needed. 10/05/16   Evon Slack, PA-C    Allergies Patient has no known allergies.  History reviewed. No pertinent family history.  Social History Social History   Tobacco Use  . Smoking status: Current Every Day Smoker    Types: E-cigarettes  . Smokeless tobacco: Never Used  Vaping Use  . Vaping Use: Every day  Substance Use Topics  . Alcohol use: Yes    Alcohol/week: 24.0 standard drinks    Types: 24 Cans of beer per week  . Drug use: Never    Review of Systems  Constitutional: No fever/chills Eyes: No visual changes. ENT: No sore throat. Cardiovascular: + chest pain. Respiratory: Denies shortness of breath. Gastrointestinal: No abdominal pain.  No nausea, no vomiting.  No diarrhea.  No constipation. Genitourinary: Negative for dysuria. Musculoskeletal: Negative for back pain. Skin: Negative for rash. Neurological: Negative for headaches, focal weakness or numbness.   ____________________________________________   PHYSICAL EXAM:  VITAL SIGNS: ED Triage Vitals  Enc Vitals Group  BP 01/11/20 0114 (!) 136/95     Pulse Rate 01/11/20 0114 (!) 101     Resp 01/11/20 0114 16     Temp 01/11/20 0114 98.3 F (36.8 C)     Temp Source 01/11/20 0114 Oral     SpO2 01/11/20 0114 99 %     Weight 01/11/20 0115 240 lb 1.3 oz (108.9 kg)     Height 01/11/20 0115 6' (1.829 m)     Head Circumference --      Peak Flow --      Pain Score 01/11/20 0114 4     Pain Loc --      Pain  Edu? --      Excl. in [place]? --     Constitutional: Alert and oriented. Well appearing and in no acute distress. Eyes: Conjunctivae are normal. PERRL. EOMI. Head: Atraumatic. Nose: No congestion/rhinnorhea. Mouth/Throat: Mucous membranes are moist.  Oropharynx non-erythematous. Neck: No stridor.   Cardiovascular: Normal rate, regular rhythm. Grossly normal heart sounds.  Good peripheral circulation. Respiratory: Normal respiratory effort.  No retractions. Lungs CTAB. Gastrointestinal: Soft and nontender. No distention. No abdominal bruits. No CVA tenderness. Musculoskeletal: No lower extremity tenderness nor edema.  No joint effusions.  No calf tenderness or calf swelling. Neurologic:  Normal speech and language. No gross focal neurologic deficits are appreciated. No gait instability. Skin:  Skin is warm, dry and intact. No rash noted. Psychiatric: Mood and affect are normal. Speech and behavior are normal.  ____________________________________________   LABS (all labs ordered are listed, but only abnormal results are displayed)  Labs Reviewed  BASIC METABOLIC PANEL - Abnormal; Notable for the following components:      Result Value   Glucose, Bld 109 (*)    All other components within normal limits  CBC  TROPONIN I (HIGH SENSITIVITY)   ____________________________________________  EKG   EKG Interpretation  Date/Time:  Thursday January 11 2020 01:13:52 EST Ventricular Rate:  98 PR Interval:  154 QRS Duration: 92 QT Interval:  326 QTC Calculation: 416 R Axis:   38 Text Interpretation: Normal sinus rhythm Normal ECG No old tracing to compare Confirmed by Rochele Raring [phone]) on 01/11/2020 2:14:55 AM       ____________________________________________  RADIOLOGY  ED MD interpretation: Chest x-ray shows no acute abnormality.  Official radiology report(s): DG Chest 2 View  Result Date: 01/11/2020 CLINICAL DATA:  Dizziness, hypertension, central chest pain for 2 days,  tobacco abuse EXAM: CHEST - 2 VIEW COMPARISON:  None. FINDINGS: Frontal and lateral views of the chest demonstrate an unremarkable cardiac silhouette. No acute airspace disease, effusion, or pneumothorax. Diffuse interstitial prominence consistent with history of tobacco abuse. No acute bony abnormalities. IMPRESSION: 1. No acute intrathoracic process. Electronically Signed   By: Sharlet Salina M.D.   On: 01/11/2020 01:43    ____________________________________________   PROCEDURES  Procedure(s) performed: None  Procedures  Critical Care performed: No  ____________________________________________   INITIAL IMPRESSION / ASSESSMENT AND PLAN / ED COURSE   Patient here with very atypical chest pain.  Was hypertensive at work [employer].  I suspect he may have had a panic attack.  He has had 2 normal high-sensitivity troponins and no risk factors for ACS other than tobacco use.  His EKG shows no ischemic change, arrhythmia or interval abnormality.  He has no risk factors for PE.  Does report sore throat and cough 2 days ago but this has resolved and is afebrile.  Low suspicion at this time that he has  influenza or COVID-19.  He reports feeling back to normal.  Doubt dissection.  I feel patient is safe to be discharged home.  Recommended follow-up with PCP as an outpatient if symptoms continue.  At this time, I do not feel there is any life-threatening condition present. I have reviewed, interpreted and discussed all results (EKG, imaging, lab, urine as appropriate) and exam findings with patient/family. I have reviewed nursing notes and appropriate previous records.  I feel the patient is safe to be discharged home without further emergent workup and can continue workup as an outpatient as needed. Discussed usual and customary return precautions. Patient/family verbalize understanding and are comfortable with this plan.  Outpatient follow-up has been provided as needed. All questions have been  answered.      ____________________________________________   FINAL CLINICAL IMPRESSION(S) / ED DIAGNOSES  Final diagnoses:  Atypical chest pain     ED Discharge Orders    None       Note:  This document was prepared using Dragon voice recognition software and may include unintentional dictation errors.    Jovin Fester, Layla Maw, DO 01/11/20 [phone]

## 2020-01-11 NOTE — ED Triage Notes (Addendum)
Pt to ED from work. Pt reportedly felt dizziness at work, that has subsided but his BP was 176/105 right after moving and then 146/98 after resting for a short period. No known hx of HTN. SOB also reported at this time.  Pt also reporting centralized chest pain for the past 2 days with a panic attach on Friday.

## 2020-01-11 NOTE — Discharge Instructions (Addendum)
To find a Primary Care Provider (PCP):  Call [phone] or [phone] to access "[facility] Find a Doctor Service."  2.  You may also go on the [facility] website at [email or web address]  3.  [facility] [phone]

## 2020-01-25 ENCOUNTER — Other Ambulatory Visit: Payer: Self-pay

## 2020-01-25 ENCOUNTER — Other Ambulatory Visit: Payer: Commercial Managed Care - PPO

## 2020-01-25 DIAGNOSIS — Z20822 Contact with and (suspected) exposure to covid-19: Secondary | ICD-10-CM

## 2020-01-27 LAB — NOVEL CORONAVIRUS, NAA: SARS-CoV-2, NAA: NOT DETECTED

## 2020-01-27 LAB — SARS-COV-2, NAA 2 DAY TAT

## 2021-03-26 ENCOUNTER — Other Ambulatory Visit: Payer: Self-pay

## 2021-03-26 ENCOUNTER — Encounter: Payer: Self-pay | Admitting: Emergency Medicine

## 2021-03-26 ENCOUNTER — Emergency Department
Admission: EM | Admit: 2021-03-26 | Discharge: 2021-03-26 | Disposition: A | Discharge status: Home / Self Care | Payer: Commercial Managed Care - PPO | Attending: Emergency Medicine | Admitting: Emergency Medicine

## 2021-03-26 ENCOUNTER — Emergency Department: Payer: Commercial Managed Care - PPO

## 2021-03-26 DIAGNOSIS — M545 Low back pain, unspecified: Secondary | ICD-10-CM | POA: Diagnosis present

## 2021-03-26 DIAGNOSIS — M5442 Lumbago with sciatica, left side: Secondary | ICD-10-CM | POA: Diagnosis not present

## 2021-03-26 DIAGNOSIS — M5126 Other intervertebral disc displacement, lumbar region: Secondary | ICD-10-CM | POA: Diagnosis not present

## 2021-03-26 MED ORDER — KETOROLAC TROMETHAMINE 60 MG/2ML IM SOLN
30.0000 mg | Freq: Once | INTRAMUSCULAR | Status: AC
  Administered 2021-03-26: 30 mg via INTRAMUSCULAR
  Filled 2021-03-26: qty 2

## 2021-03-26 MED ORDER — DIAZEPAM 2 MG PO TABS
2.0000 mg | ORAL_TABLET | Freq: Once | ORAL | Status: AC
  Administered 2021-03-26: 2 mg via ORAL
  Filled 2021-03-26: qty 1

## 2021-03-26 MED ORDER — PREDNISONE 20 MG PO TABS
30.0000 mg | ORAL_TABLET | Freq: Once | ORAL | Status: AC
  Administered 2021-03-26: 30 mg via ORAL
  Filled 2021-03-26: qty 1

## 2021-03-26 MED ORDER — HYDROCODONE-ACETAMINOPHEN 5-325 MG PO TABS
1.0000 | ORAL_TABLET | Freq: Once | ORAL | Status: AC
  Administered 2021-03-26: 1 via ORAL
  Filled 2021-03-26: qty 1

## 2021-03-26 MED ORDER — DIAZEPAM 2 MG PO TABS: 2.0000 mg | ORAL_TABLET | Freq: Three times a day (TID) | ORAL | 0 refills | Status: DC | PRN

## 2021-03-26 MED ORDER — METHYLPREDNISOLONE 4 MG PO TBPK: ORAL_TABLET | ORAL | 0 refills | Status: DC

## 2021-03-26 MED ORDER — MELOXICAM 15 MG PO TABS: 15.0000 mg | ORAL_TABLET | Freq: Every day | ORAL | 0 refills | Status: DC

## 2021-03-26 MED ORDER — HYDROCODONE-ACETAMINOPHEN 5-325 MG PO TABS: 1.0000 | ORAL_TABLET | Freq: Four times a day (QID) | ORAL | 0 refills | Status: DC | PRN

## 2021-03-26 NOTE — ED Provider Notes (Signed)
? ?[facility] ?Provider Note ? ? ? Event Date/Time  ? First MD Initiated Contact with Patient 03/26/21 0150   ?  (approximate) ? ? ?History  ? ?Back Pain ? ? ?HPI ? ?Tanner [name] is a 36 y.o. male who presents to the ED from work at the [employer] with a chief complaint of lower back pain.  Patient reports a 1 week history of left lower back pain radiating into his left leg.  Denies associated numbness or tingling.  For over 6 months he has felt like his left lower leg has been weaker than his right.  Denies leaning, falling, or foot drag.  Denies slurred speech, altered mentation, facial droop, upper extremity weakness/numbness/tingling.  Denies fall/trauma/injury.  Stands on his feet for 12 hours at work with heavy lifting and repetitive motions of bending.  Denies fever, chills, neck pain, chest pain, shortness of breath, abdominal pain, nausea, vomiting or dysuria.  Denies bowel or bladder incontinence. ?  ? ? ?Past Medical History  ?History reviewed. No pertinent past medical history. ? ? ?Active Problem List  ?There are no problems to display for this patient. ? ? ? ?Past Surgical History  ?History reviewed. No pertinent surgical history. ? ? ?Home Medications  ? ?Prior to Admission medications   ?Medication Sig Start Date End Date Taking? Authorizing Provider  ?diazepam (VALIUM) 2 MG tablet Take 1 tablet (2 mg total) by mouth every 8 (eight) hours as needed for muscle spasms. 03/26/21  Yes Paulette Blanch, MD  ?HYDROcodone-acetaminophen (NORCO) 5-325 MG tablet Take 1 tablet by mouth every 6 (six) hours as needed for moderate pain. 03/26/21  Yes Paulette Blanch, MD  ?meloxicam (MOBIC) 15 MG tablet Take 1 tablet (15 mg total) by mouth daily. 03/26/21  Yes Paulette Blanch, MD  ?methylPREDNISolone (MEDROL DOSEPAK) 4 MG TBPK tablet Take as directed 03/26/21  Yes Paulette Blanch, MD  ?brompheniramine-pseudoephedrine-DM 30-2-10 MG/5ML syrup Take 10 mLs by mouth 4 (four) times daily as needed.  01/07/16   Cuthriell, Charline Bills, PA-C  ?brompheniramine-pseudoephedrine-DM 30-2-10 MG/5ML syrup Take 5 mLs by mouth 4 (four) times daily as needed. 03/30/16   Sable Feil, PA-C  ?cetirizine (ZYRTEC) 10 MG tablet Take 1 tablet (10 mg total) by mouth daily. 01/07/16   Cuthriell, Charline Bills, PA-C  ?fluticasone (FLONASE) 50 MCG/ACT nasal spray Place 1 spray into both nostrils 2 (two) times daily. 01/07/16   Cuthriell, Charline Bills, PA-C  ?ibuprofen (ADVIL,MOTRIN) 800 MG tablet Take 1 tablet (800 mg total) by mouth every 8 (eight) hours as needed. 10/05/16   Duanne Guess, PA-C  ? ? ? ?Allergies  ?Patient has no known allergies. ? ? ?Family History  ?No family history on file. ? ? ?Physical Exam  ?Triage Vital Signs: ?ED Triage Vitals  ?Enc Vitals Group  ?   BP 03/26/21 0126 128/84  ?   Pulse Rate 03/26/21 0126 93  ?   Resp 03/26/21 0126 18  ?   Temp 03/26/21 0126 98.7 ?F (37.1 ?C)  ?   Temp Source 03/26/21 0126 Oral  ?   SpO2 03/26/21 0126 100 %  ?   Weight 03/26/21 0125 240 lb (108.9 kg)  ?   Height 03/26/21 0125 '6\' 1"'$  (1.854 m)  ?   Head Circumference --   ?   Peak Flow --   ?   Pain Score 03/26/21 0125 7  ?   Pain Loc --   ?   Pain  Edu? --   ?   Excl. in [place]? --   ? ? ?Updated Vital Signs: ?BP 128/84   Pulse 93   Temp 98.7 ?F (37.1 ?C) (Oral)   Resp 18   Ht '6\' 1"'$  (1.854 m)   Wt 108.9 kg   SpO2 100%   BMI 31.66 kg/m?  ? ? ?General: Awake, no distress.  ?CV:  RRR good peripheral perfusion.  ?Resp:  Normal effort.  CTA B. ?Abd:  Nontender.  No distention.  ?Other:  No spinal tenderness to palpation.  Negative straight leg raise.  5/5 motor strength and sensation all extremities. ? ? ?ED Results / Procedures / Treatments  ?Labs ?(all labs ordered are listed, but only abnormal results are displayed) ?Labs Reviewed - No data to display ? ? ?EKG ? ?None ? ? ?RADIOLOGY ?I have independently visualized and reviewed patient's lumbar MRI as well as noted the radiology interpretation: ? ?MRI lumbar spine: Small disc  extrusion at L5-S1; cauda equina appears normal ? ?Official radiology report(s): ?MR LUMBAR SPINE WO CONTRAST ? ?Result Date: 03/26/2021 ?CLINICAL DATA:  Low back pain radiating to left leg EXAM: MRI LUMBAR SPINE WITHOUT CONTRAST TECHNIQUE: Multiplanar, multisequence MR imaging of the lumbar spine was performed. No intravenous contrast was administered. COMPARISON:  None. FINDINGS: Segmentation:  Standard. Alignment:  Physiologic. Vertebrae:  No fracture, evidence of discitis, or bone lesion. Conus medullaris and cauda equina: Conus extends to the L1 level. Conus and cauda equina appear normal. Paraspinal and other soft tissues: Negative Disc levels: At L5-S1, there is a small left subarticular disc extrusion with minimal inferior migration. This contacts the left S1 nerve root in the lateral recess. There is no central spinal canal stenosis. No neural foraminal stenosis. IMPRESSION: Small left subarticular disc extrusion at L5-S1 contacting the left S1 nerve root in the lateral recess. Electronically Signed   By: Ulyses Jarred M.D.   On: 03/26/2021 02:56   ? ? ?PROCEDURES: ? ?Critical Care performed: No ? ?Procedures ? ? ?MEDICATIONS ORDERED IN ED: ?Medications  ?ketorolac (TORADOL) injection 30 mg (30 mg Intramuscular Given 03/26/21 0304)  ?HYDROcodone-acetaminophen (NORCO/VICODIN) 5-325 MG per tablet 1 tablet (1 tablet Oral Given 03/26/21 0304)  ?diazepam (VALIUM) tablet 2 mg (2 mg Oral Given 03/26/21 0304)  ?predniSONE (DELTASONE) tablet 30 mg (30 mg Oral Given 03/26/21 0304)  ? ? ? ?IMPRESSION / MDM / ASSESSMENT AND PLAN / ED COURSE  ?I reviewed the triage vital signs and the nursing notes. ?             ?               ?36 year old male presenting with low back pain radiating to his left lower leg.  Symptoms suggestive of lumbar radiculopathy.  How ever, what is concerning is the several months history of subjectively feeling like his left lower leg is weaker than the right.  We discussed obtaining MRI lumbar spine  tonight.  Initially patient declined but subsequently change his mind.  Will obtain MRI, administer IM ketorolac, Norco, Valium and start Prednisone taper.  Will reassess. ? ? ?Clinical Course as of 03/26/21 0439  ?Wed Mar 26, 2021  ?0300 Updated patient on [id number]:[id number] left subarticular disc extrusion at L5-S1 contacting the left ?S1 nerve root in the lateral recess.  Will discharge home with Medrol Dosepak and as needed pain/muscle relaxer medications.  Patient will follow-up with neurosurgery as needed.  Strict return precautions given.  Patient verbalizes understanding and agrees with plan of  care. ? ? [JS]  ?  ?Clinical Course User Index ?[JS] Paulette Blanch, MD  ? ? ? ?FINAL CLINICAL IMPRESSION(S) / ED DIAGNOSES  ? ?Final diagnoses:  ?Acute left-sided low back pain with left-sided sciatica  ?Lumbar herniated disc  ? ? ? ?Rx / DC Orders  ? ?ED Discharge Orders   ? ?      Ordered  ?  meloxicam (MOBIC) 15 MG tablet  Daily       ? 03/26/21 0302  ?  HYDROcodone-acetaminophen (NORCO) 5-325 MG tablet  Every 6 hours PRN       ? 03/26/21 0302  ?  diazepam (VALIUM) 2 MG tablet  Every 8 hours PRN       ? 03/26/21 0302  ?  methylPREDNISolone (MEDROL DOSEPAK) 4 MG TBPK tablet       ? 03/26/21 0302  ? ?  ?  ? ?  ? ? ? ?Note:  This document was prepared using Dragon voice recognition software and may include unintentional dictation errors. ?  ?Paulette Blanch, MD ?03/26/21 [phone] ? ?

## 2021-03-26 NOTE — ED Triage Notes (Signed)
Patient ambulatory to triage with steady gait, without difficulty or distress noted; pt reports last wk having left lower back pain that radiates down back side of left leg; denies any known injury; pt reports pain increases with any activity ?

## 2021-03-26 NOTE — Discharge Instructions (Addendum)
1.  Take steroid taper as prescribed (Medrol Dosepak). ?2.  You may take medicines as needed for pain and muscle spasms (Mobic/Norco/Valium). ?3.  Apply moist heat to affected area several times daily. ?4.  Return to the ER for worsening symptoms, extremity weakness, loss of bowel or bladder control, or other concerns. ?

## 2021-08-11 ENCOUNTER — Other Ambulatory Visit: Payer: Self-pay

## 2021-08-11 ENCOUNTER — Emergency Department
Admission: EM | Admit: 2021-08-11 | Discharge: 2021-08-11 | Disposition: A | Discharge status: Home / Self Care | Payer: Commercial Managed Care - PPO | Source: Home / Self Care | Attending: Emergency Medicine | Admitting: Emergency Medicine

## 2021-08-11 DIAGNOSIS — Z20822 Contact with and (suspected) exposure to covid-19: Secondary | ICD-10-CM | POA: Insufficient documentation

## 2021-08-11 DIAGNOSIS — B349 Viral infection, unspecified: Secondary | ICD-10-CM | POA: Insufficient documentation

## 2021-08-11 DIAGNOSIS — C9000 Multiple myeloma not having achieved remission: Secondary | ICD-10-CM | POA: Diagnosis not present

## 2021-08-11 DIAGNOSIS — M5432 Sciatica, left side: Secondary | ICD-10-CM

## 2021-08-11 LAB — URINALYSIS, ROUTINE W REFLEX MICROSCOPIC
Bacteria, UA: NONE SEEN
Bilirubin Urine: NEGATIVE
Glucose, UA: NEGATIVE mg/dL
Ketones, ur: NEGATIVE mg/dL
Nitrite: NEGATIVE
Protein, ur: 100 mg/dL — AB
Specific Gravity, Urine: 1.023 (ref 1.005–1.030)
pH: 5 (ref 5.0–8.0)

## 2021-08-11 LAB — RESP PANEL BY RT-PCR (FLU A&B, COVID) ARPGX2
Influenza A by PCR: NEGATIVE
Influenza B by PCR: NEGATIVE
SARS Coronavirus 2 by RT PCR: NEGATIVE

## 2021-08-11 LAB — CBG MONITORING, ED: Glucose-Capillary: 78 mg/dL (ref 70–99)

## 2021-08-11 MED ORDER — PREDNISONE 50 MG PO TABS: 50.0000 mg | ORAL_TABLET | Freq: Every day | ORAL | 0 refills | Status: DC

## 2021-08-11 MED ORDER — PREDNISONE 20 MG PO TABS
40.0000 mg | ORAL_TABLET | Freq: Once | ORAL | Status: AC
  Administered 2021-08-11: 40 mg via ORAL
  Filled 2021-08-11: qty 2

## 2021-08-11 MED ORDER — IBUPROFEN 800 MG PO TABS
800.0000 mg | ORAL_TABLET | ORAL | Status: AC
  Administered 2021-08-11: 800 mg via ORAL
  Filled 2021-08-11: qty 1

## 2021-08-11 NOTE — ED Notes (Signed)
See triage note  Presents with pain to lower back which is moving into left leg  Denies any recent injury  ambulates with slight limp d/t pain  Also has been fatigued,runny nose and slight cough

## 2021-08-11 NOTE — ED Triage Notes (Signed)
Pt states that he has pain from his L buttock down his leg- pt states that he has a hx of sciatica issues and has been seen here for it before- pt states he tried taking a hydrocodone that he was prescribed last time- pt also has complains of cough, chills, and sore throat

## 2021-08-11 NOTE — ED Provider Triage Note (Signed)
Emergency Medicine Provider Triage Evaluation Note  Tanner [name] , a 36 y.o. male  was evaluated in triage.  Pt complains of left lower back pain that radiates into the left leg.  History of sciatica.  He is also had URI symptoms over the past few days.  No known fever.  He has taken hydrocodone that helped but continues to have symptoms..  Physical Exam  There were no vitals taken for this visit. Gen:   Awake, no distress   Resp:  Normal effort  MSK:   Moves extremities without difficulty  Other:    Medical Decision Making  Medically screening exam initiated at 2:32 PM.  Appropriate orders placed.  Keijuan Schellhase was informed that the remainder of the evaluation will be completed by another provider, this initial triage assessment does not replace that evaluation, and the importance of remaining in the ED until their evaluation is complete.    Victorino Dike, FNP 08/11/21 1533

## 2021-08-11 NOTE — ED Provider Notes (Signed)
[facility] Provider Note    Event Date/Time   First MD Initiated Contact with Patient 08/11/21 1504     (approximate)   History   Leg Pain   HPI  Tanner [name] is a 36 y.o. male with a previous history of left-sided sciatica  Friday began having lower buttock pain that shoots towards the back of his left calf.  No numbness or weakness in the leg.  No change in bowel or bladder habits.  Reports he had the same symptoms before has been treated for "sciatica".  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 him to do more heavy lifting it seems to be exacerbating this problem    Also since Friday though he has noticed that he seems to be having a little bit of a runny nose, feeling slightly more fatigued than typical, had a very slight cough but no shortness of breath, and also increased thirst and feels like he is urinating more frequently than typical but no pain or burning   Occasionally smokes, no IV drug use  Physical Exam   Triage Vital Signs: ED Triage Vitals  Enc Vitals Group     BP 08/11/21 1433 138/85     Pulse Rate 08/11/21 1433 (!) 102     Resp 08/11/21 1433 20     Temp 08/11/21 1433 99.1 F (37.3 C)     Temp Source 08/11/21 1433 Oral     SpO2 08/11/21 1433 98 %     Weight 08/11/21 1432 240 lb (108.9 kg)     Height 08/11/21 1432 6' (1.829 m)     Head Circumference --      Peak Flow --      Pain Score 08/11/21 1432 4     Pain Loc --      Pain Edu? --      Excl. in [place]? --     Most recent vital signs: Vitals:   08/11/21 1433  BP: 138/85  Pulse: (!) 102  Resp: 20  Temp: 99.1 F (37.3 C)  SpO2: 98%     General: Awake, no distress.  Very pleasant.  Well oriented.  Ambulates without difficulty.  Very slight coryza. CV:  Good peripheral perfusion.  Normal heart  tone. Resp:  Normal effort.  Clear bilaterally Abd:  No distention.  Other:  Advises some discomfort with standing, outlines the area from the left mid buttock runs along the back of his leg down to the level of about the back of his knee.  There is no loss sensation is strong pulses in the left lower extremity dorsalis pedis and posterior tibial.  Reflexes in the left lower extremity.  He ambulates with normal strength and gait.   ED Results / Procedures / Treatments   Labs (all labs ordered are listed, but only abnormal results are displayed) Labs Reviewed  URINALYSIS, ROUTINE W REFLEX MICROSCOPIC - Abnormal; Notable for the following components:      Result Value   Color, Urine AMBER (*)    APPearance CLOUDY (*)    Hgb urine dipstick MODERATE (*)    Protein, ur 100 (*)    Leukocytes,Ua TRACE (*)    All other components within normal limits  RESP PANEL BY RT-PCR (FLU A&B, COVID) [id number]  URINE  CULTURE  CBG MONITORING, ED     EKG     RADIOLOGY  No noted indication for acute imaging  No back pain red flags   PROCEDURES:  Critical Care performed: No  Procedures   MEDICATIONS ORDERED IN ED: Medications  ibuprofen (ADVIL) tablet 800 mg (800 mg Oral Given 08/11/21 1559)  predniSONE (DELTASONE) tablet 40 mg (40 mg Oral Given 08/11/21 1607)     IMPRESSION / MDM / ASSESSMENT AND PLAN / ED COURSE  I reviewed the triage vital signs and the nursing notes.                              Differential diagnosis includes, but is not limited to, likely sciatica versus radicular pain.  No neurologic deficits.  Clinical history and exam seem consistent with sciatica.  Discussed with patient reports last time he took medication he thinks he took a steroid and after taking it for a few days his sciatica went away completely.  Will prescribe prednisone.  However he also reports general fatigue slight increased urination and increased thirst but also slight runny nose and a mild cough.   Clear lung sounds normal oxygen saturation no clinical signs or symptoms of be suggestive of acute bacterial infection or pneumonia by clinical exam.  Since Friday that his symptoms started, at this point I do not think that with his limited risk factors we would initiate any specific antiviral therapies.  Patient's presentation is most consistent with acute illness / injury with system symptoms.  ----------------------------------------- 5:36 PM on 08/11/2021 ----------------------------------------- Patient resting comfortably no distress.  Discussed with him, and reviewed urinalysis which shows some evidence of inflammatory findings but no clear evidence of bacteria.  Will send for culture.  He reports just increased urinary frequency, and at this point I do not believe I would treat for urinary tract infection unless he had a positive culture on the urine which has been sent.  Patient aware of this.  Return precautions and treatment recommendations and follow-up discussed with the patient who is agreeable with the plan.    FINAL CLINICAL IMPRESSION(S) / ED DIAGNOSES   Final diagnoses:  Left sciatic nerve pain  Viral syndrome     Rx / DC Orders   ED Discharge Orders          Ordered    predniSONE (DELTASONE) 50 MG tablet  Daily with breakfast        08/11/21 1735             Note:  This document was prepared using Dragon voice recognition software and may include unintentional dictation errors.   Delman Kitten, MD 08/11/21 1736

## 2021-08-12 LAB — URINE CULTURE: Culture: <10000 — AB

## 2021-08-14 ENCOUNTER — Other Ambulatory Visit: Payer: Self-pay

## 2021-08-14 ENCOUNTER — Inpatient Hospital Stay: Payer: Commercial Managed Care - PPO

## 2021-08-14 ENCOUNTER — Emergency Department: Payer: Commercial Managed Care - PPO

## 2021-08-14 ENCOUNTER — Encounter: Payer: Self-pay | Admitting: Emergency Medicine

## 2021-08-14 ENCOUNTER — Inpatient Hospital Stay
Admission: EM | Admit: 2021-08-14 | Discharge: 2021-08-22 | DRG: 841 | Disposition: A | Discharge status: Home / Self Care | Payer: Commercial Managed Care - PPO | Attending: Internal Medicine | Admitting: Internal Medicine

## 2021-08-14 DIAGNOSIS — F17200 Nicotine dependence, unspecified, uncomplicated: Secondary | ICD-10-CM | POA: Diagnosis present

## 2021-08-14 DIAGNOSIS — E790 Hyperuricemia without signs of inflammatory arthritis and tophaceous disease: Secondary | ICD-10-CM | POA: Diagnosis present

## 2021-08-14 DIAGNOSIS — Z20822 Contact with and (suspected) exposure to covid-19: Secondary | ICD-10-CM | POA: Diagnosis present

## 2021-08-14 DIAGNOSIS — R899 Unspecified abnormal finding in specimens from other organs, systems and tissues: Secondary | ICD-10-CM

## 2021-08-14 DIAGNOSIS — Z79899 Other long term (current) drug therapy: Secondary | ICD-10-CM

## 2021-08-14 DIAGNOSIS — R7401 Elevation of levels of liver transaminase levels: Secondary | ICD-10-CM | POA: Diagnosis present

## 2021-08-14 DIAGNOSIS — M5432 Sciatica, left side: Secondary | ICD-10-CM | POA: Diagnosis present

## 2021-08-14 DIAGNOSIS — E861 Hypovolemia: Secondary | ICD-10-CM | POA: Diagnosis present

## 2021-08-14 DIAGNOSIS — R5383 Other fatigue: Secondary | ICD-10-CM | POA: Diagnosis not present

## 2021-08-14 DIAGNOSIS — F1010 Alcohol abuse, uncomplicated: Secondary | ICD-10-CM | POA: Diagnosis present

## 2021-08-14 DIAGNOSIS — M5430 Sciatica, unspecified side: Secondary | ICD-10-CM | POA: Diagnosis not present

## 2021-08-14 DIAGNOSIS — C9000 Multiple myeloma not having achieved remission: Secondary | ICD-10-CM | POA: Diagnosis present

## 2021-08-14 DIAGNOSIS — F419 Anxiety disorder, unspecified: Secondary | ICD-10-CM | POA: Diagnosis present

## 2021-08-14 DIAGNOSIS — Z6832 Body mass index (BMI) 32.0-32.9, adult: Secondary | ICD-10-CM

## 2021-08-14 DIAGNOSIS — M899 Disorder of bone, unspecified: Secondary | ICD-10-CM

## 2021-08-14 DIAGNOSIS — E878 Other disorders of electrolyte and fluid balance, not elsewhere classified: Secondary | ICD-10-CM | POA: Diagnosis not present

## 2021-08-14 DIAGNOSIS — Z5111 Encounter for antineoplastic chemotherapy: Secondary | ICD-10-CM

## 2021-08-14 DIAGNOSIS — Z789 Other specified health status: Secondary | ICD-10-CM | POA: Diagnosis present

## 2021-08-14 DIAGNOSIS — Z811 Family history of alcohol abuse and dependence: Secondary | ICD-10-CM

## 2021-08-14 DIAGNOSIS — F17290 Nicotine dependence, other tobacco product, uncomplicated: Secondary | ICD-10-CM | POA: Diagnosis present

## 2021-08-14 DIAGNOSIS — E876 Hypokalemia: Secondary | ICD-10-CM | POA: Diagnosis present

## 2021-08-14 DIAGNOSIS — N179 Acute kidney failure, unspecified: Secondary | ICD-10-CM | POA: Diagnosis present

## 2021-08-14 DIAGNOSIS — R7989 Other specified abnormal findings of blood chemistry: Secondary | ICD-10-CM | POA: Diagnosis present

## 2021-08-14 DIAGNOSIS — K5900 Constipation, unspecified: Secondary | ICD-10-CM | POA: Diagnosis present

## 2021-08-14 DIAGNOSIS — E669 Obesity, unspecified: Secondary | ICD-10-CM | POA: Diagnosis present

## 2021-08-14 DIAGNOSIS — E860 Dehydration: Secondary | ICD-10-CM | POA: Diagnosis present

## 2021-08-14 DIAGNOSIS — IMO0001 Reserved for inherently not codable concepts without codable children: Secondary | ICD-10-CM | POA: Diagnosis present

## 2021-08-14 DIAGNOSIS — D509 Iron deficiency anemia, unspecified: Secondary | ICD-10-CM | POA: Diagnosis present

## 2021-08-14 DIAGNOSIS — E871 Hypo-osmolality and hyponatremia: Secondary | ICD-10-CM | POA: Diagnosis present

## 2021-08-14 DIAGNOSIS — E538 Deficiency of other specified B group vitamins: Secondary | ICD-10-CM | POA: Diagnosis present

## 2021-08-14 DIAGNOSIS — C9001 Multiple myeloma in remission: Secondary | ICD-10-CM | POA: Diagnosis present

## 2021-08-14 DIAGNOSIS — F1090 Alcohol use, unspecified, uncomplicated: Secondary | ICD-10-CM | POA: Diagnosis present

## 2021-08-14 HISTORY — DX: Reserved for inherently not codable concepts without codable children: IMO0001

## 2021-08-14 HISTORY — DX: Sciatica, unspecified side: M54.30

## 2021-08-14 HISTORY — DX: Alcohol use, unspecified, uncomplicated: F10.90

## 2021-08-14 HISTORY — DX: Nicotine dependence, unspecified, uncomplicated: F17.200

## 2021-08-14 HISTORY — DX: Other specified health status: Z78.9

## 2021-08-14 LAB — URINALYSIS, ROUTINE W REFLEX MICROSCOPIC
Bilirubin Urine: NEGATIVE
Glucose, UA: NEGATIVE mg/dL
Ketones, ur: NEGATIVE mg/dL
Leukocytes,Ua: NEGATIVE
Nitrite: NEGATIVE
Protein, ur: NEGATIVE mg/dL
Specific Gravity, Urine: 1.006 (ref 1.005–1.030)
Squamous Epithelial / HPF: NONE SEEN (ref 0–5)
pH: 5 (ref 5.0–8.0)

## 2021-08-14 LAB — CBC WITH DIFFERENTIAL/PLATELET
Abs Immature Granulocytes: 0.13 10*3/uL — ABNORMAL HIGH (ref 0.00–0.07)
Basophils Absolute: 0 10*3/uL (ref 0.0–0.1)
Basophils Relative: 0 %
Eosinophils Absolute: 0.1 10*3/uL (ref 0.0–0.5)
Eosinophils Relative: 1 %
HCT: 33 % — ABNORMAL LOW (ref 39.0–52.0)
Hemoglobin: 11.2 g/dL — ABNORMAL LOW (ref 13.0–17.0)
Immature Granulocytes: 1 %
Lymphocytes Relative: 17 %
Lymphs Abs: 1.7 10*3/uL (ref 0.7–4.0)
MCH: 26.8 pg (ref 26.0–34.0)
MCHC: 33.9 g/dL (ref 30.0–36.0)
MCV: 78.9 fL — ABNORMAL LOW (ref 80.0–100.0)
Monocytes Absolute: 0.9 10*3/uL (ref 0.1–1.0)
Monocytes Relative: 9 %
Neutro Abs: 7.3 10*3/uL (ref 1.7–7.7)
Neutrophils Relative %: 72 %
Platelets: 206 10*3/uL (ref 150–400)
RBC: 4.18 MIL/uL — ABNORMAL LOW (ref 4.22–5.81)
RDW: 14.6 % (ref 11.5–15.5)
WBC: 10.2 10*3/uL (ref 4.0–10.5)
nRBC: 0 % (ref 0.0–0.2)

## 2021-08-14 LAB — BASIC METABOLIC PANEL
Anion gap: 7 (ref 5–15)
Anion gap: 3 — ABNORMAL LOW (ref 5–15)
BUN: 69 mg/dL — ABNORMAL HIGH (ref 6–20)
BUN: 67 mg/dL — ABNORMAL HIGH (ref 6–20)
CO2: 22 mmol/L (ref 22–32)
CO2: 24 mmol/L (ref 22–32)
Calcium: 13.3 mg/dL (ref 8.9–10.3)
Calcium: 11.8 mg/dL — ABNORMAL HIGH (ref 8.9–10.3)
Chloride: 97 mmol/L — ABNORMAL LOW (ref 98–111)
Chloride: 100 mmol/L (ref 98–111)
Creatinine, Ser: 6.41 mg/dL — ABNORMAL HIGH (ref 0.61–1.24)
Creatinine, Ser: 5.31 mg/dL — ABNORMAL HIGH (ref 0.61–1.24)
GFR, Estimated: 11 mL/min — ABNORMAL LOW (ref >60)
GFR, Estimated: 14 mL/min — ABNORMAL LOW (ref >60)
Glucose, Bld: 71 mg/dL (ref 70–99)
Glucose, Bld: 98 mg/dL (ref 70–99)
Potassium: 3.4 mmol/L — ABNORMAL LOW (ref 3.5–5.1)
Potassium: 3.3 mmol/L — ABNORMAL LOW (ref 3.5–5.1)
Sodium: 126 mmol/L — ABNORMAL LOW (ref 135–145)
Sodium: 127 mmol/L — ABNORMAL LOW (ref 135–145)

## 2021-08-14 LAB — COMPREHENSIVE METABOLIC PANEL
ALT: 57 U/L — ABNORMAL HIGH (ref 0–44)
AST: 45 U/L — ABNORMAL HIGH (ref 15–41)
Albumin: 2.8 g/dL — ABNORMAL LOW (ref 3.5–5.0)
Alkaline Phosphatase: 52 U/L (ref 38–126)
Anion gap: 9 (ref 5–15)
BUN: 73 mg/dL — ABNORMAL HIGH (ref 6–20)
CO2: 23 mmol/L (ref 22–32)
Calcium: 14.3 mg/dL (ref 8.9–10.3)
Chloride: 95 mmol/L — ABNORMAL LOW (ref 98–111)
Creatinine, Ser: 6.6 mg/dL — ABNORMAL HIGH (ref 0.61–1.24)
GFR, Estimated: 11 mL/min — ABNORMAL LOW (ref >60)
Glucose, Bld: 77 mg/dL (ref 70–99)
Potassium: 3.4 mmol/L — ABNORMAL LOW (ref 3.5–5.1)
Sodium: 127 mmol/L — ABNORMAL LOW (ref 135–145)
Total Bilirubin: 1.3 mg/dL — ABNORMAL HIGH (ref 0.3–1.2)
Total Protein: <3 g/dL — ABNORMAL LOW (ref 6.5–8.1)

## 2021-08-14 LAB — PHOSPHORUS: Phosphorus: >30 mg/dL — ABNORMAL HIGH (ref 2.5–4.6)

## 2021-08-14 LAB — URINE DRUG SCREEN, QUALITATIVE (ARMC ONLY)
Amphetamines, Ur Screen: NOT DETECTED
Barbiturates, Ur Screen: NOT DETECTED
Benzodiazepine, Ur Scrn: NOT DETECTED
Cannabinoid 50 Ng, Ur ~~LOC~~: NOT DETECTED
Cocaine Metabolite,Ur ~~LOC~~: NOT DETECTED
MDMA (Ecstasy)Ur Screen: NOT DETECTED
Methadone Scn, Ur: NOT DETECTED
Opiate, Ur Screen: NOT DETECTED
Phencyclidine (PCP) Ur S: NOT DETECTED
Tricyclic, Ur Screen: NOT DETECTED

## 2021-08-14 LAB — RETICULOCYTES
Immature Retic Fract: 9.5 % (ref 2.3–15.9)
RBC.: 4.16 MIL/uL — ABNORMAL LOW (ref 4.22–5.81)
Retic Count, Absolute: 38.3 10*3/uL (ref 19.0–186.0)
Retic Ct Pct: 0.9 % (ref 0.4–3.1)

## 2021-08-14 LAB — FERRITIN: Ferritin: 922 ng/mL — ABNORMAL HIGH (ref 24–336)

## 2021-08-14 LAB — PROTIME-INR
INR: 1.1 (ref 0.8–1.2)
Prothrombin Time: 14.3 seconds (ref 11.4–15.2)

## 2021-08-14 LAB — VITAMIN B12: Vitamin B-12: 146 pg/mL — ABNORMAL LOW (ref 180–914)

## 2021-08-14 LAB — SODIUM, URINE, RANDOM: Sodium, Ur: 77 mmol/L

## 2021-08-14 LAB — MAGNESIUM: Magnesium: 1.6 mg/dL — ABNORMAL LOW (ref 1.7–2.4)

## 2021-08-14 LAB — FOLATE: Folate: 9.1 ng/mL (ref >5.9)

## 2021-08-14 LAB — HEPATITIS PANEL, ACUTE
HCV Ab: NONREACTIVE
Hep A IgM: NONREACTIVE
Hep B C IgM: NONREACTIVE
Hepatitis B Surface Ag: NONREACTIVE

## 2021-08-14 LAB — IRON AND TIBC
Iron: 121 ug/dL (ref 45–182)
Saturation Ratios: 39 % (ref 17.9–39.5)
TIBC: 311 ug/dL (ref 250–450)
UIBC: 190 ug/dL

## 2021-08-14 LAB — HIV ANTIBODY (ROUTINE TESTING W REFLEX): HIV Screen 4th Generation wRfx: NONREACTIVE

## 2021-08-14 LAB — OSMOLALITY: Osmolality: 312 mOsm/kg — ABNORMAL HIGH (ref 275–295)

## 2021-08-14 LAB — OSMOLALITY, URINE: Osmolality, Ur: 280 mOsm/kg — ABNORMAL LOW (ref 300–900)

## 2021-08-14 LAB — AMMONIA: Ammonia: 34 umol/L (ref 9–35)

## 2021-08-14 LAB — CK: Total CK: 301 U/L (ref 49–397)

## 2021-08-14 MED ORDER — ADULT MULTIVITAMIN W/MINERALS CH
1.0000 | ORAL_TABLET | Freq: Every day | ORAL | Status: DC
  Administered 2021-08-14 – 2021-08-22 (×9): 1 via ORAL
  Filled 2021-08-14 (×9): qty 1

## 2021-08-14 MED ORDER — SODIUM CHLORIDE 0.9 % IV BOLUS
1000.0000 mL | Freq: Once | INTRAVENOUS | Status: AC
  Administered 2021-08-14: 1000 mL via INTRAVENOUS

## 2021-08-14 MED ORDER — POLYETHYLENE GLYCOL 3350 17 G PO PACK
17.0000 g | PACK | Freq: Every day | ORAL | Status: DC | PRN
Start: 2021-08-14 — End: 2021-08-22

## 2021-08-14 MED ORDER — HEPARIN SODIUM (PORCINE) 5000 UNIT/ML IJ SOLN
5000.0000 [IU] | Freq: Three times a day (TID) | INTRAMUSCULAR | Status: DC
  Administered 2021-08-14 – 2021-08-20 (×18): 5000 [IU] via SUBCUTANEOUS
  Filled 2021-08-14 (×18): qty 1

## 2021-08-14 MED ORDER — MAGNESIUM SULFATE 2 GM/50ML IV SOLN
2.0000 g | Freq: Once | INTRAVENOUS | Status: AC
  Administered 2021-08-14: 2 g via INTRAVENOUS
  Filled 2021-08-14: qty 50

## 2021-08-14 MED ORDER — LORAZEPAM 2 MG/ML IJ SOLN: 1.0000 mg | INTRAMUSCULAR | Status: AC | PRN

## 2021-08-14 MED ORDER — NICOTINE 21 MG/24HR TD PT24
21.0000 mg | MEDICATED_PATCH | Freq: Every day | TRANSDERMAL | Status: DC
  Filled 2021-08-14 (×4): qty 1

## 2021-08-14 MED ORDER — FOLIC ACID 1 MG PO TABS
1.0000 mg | ORAL_TABLET | Freq: Every day | ORAL | Status: DC
  Administered 2021-08-14 – 2021-08-22 (×9): 1 mg via ORAL
  Filled 2021-08-14 (×9): qty 1

## 2021-08-14 MED ORDER — OXYCODONE HCL 5 MG PO TABS: 5.0000 mg | ORAL_TABLET | Freq: Four times a day (QID) | ORAL | Status: DC | PRN

## 2021-08-14 MED ORDER — THIAMINE HCL 100 MG/ML IJ SOLN
100.0000 mg | Freq: Every day | INTRAMUSCULAR | Status: DC
  Filled 2021-08-14: qty 2

## 2021-08-14 MED ORDER — SODIUM CHLORIDE 0.9 % IV SOLN: INTRAVENOUS | Status: DC

## 2021-08-14 MED ORDER — SENNOSIDES-DOCUSATE SODIUM 8.6-50 MG PO TABS
1.0000 | ORAL_TABLET | Freq: Two times a day (BID) | ORAL | Status: DC
  Administered 2021-08-14 – 2021-08-18 (×7): 1 via ORAL
  Filled 2021-08-14 (×13): qty 1

## 2021-08-14 MED ORDER — IOHEXOL 9 MG/ML PO SOLN
500.0000 mL | ORAL | Status: AC
  Administered 2021-08-14 (×2): 500 mL via ORAL
  Filled 2021-08-14 (×2): qty 500

## 2021-08-14 MED ORDER — THIAMINE HCL 100 MG PO TABS
100.0000 mg | ORAL_TABLET | Freq: Every day | ORAL | Status: DC
  Administered 2021-08-14 – 2021-08-22 (×9): 100 mg via ORAL
  Filled 2021-08-14 (×9): qty 1

## 2021-08-14 MED ORDER — CALCITONIN (SALMON) 200 UNIT/ML IJ SOLN
4.0000 [IU]/kg | Freq: Once | INTRAMUSCULAR | Status: AC
  Administered 2021-08-14: 310 [IU] via INTRAMUSCULAR
  Filled 2021-08-14: qty 1.55

## 2021-08-14 MED ORDER — LORAZEPAM 2 MG/ML IJ SOLN: 0.0000 mg | Freq: Four times a day (QID) | INTRAMUSCULAR | Status: AC

## 2021-08-14 MED ORDER — LORAZEPAM 2 MG/ML IJ SOLN: 0.0000 mg | Freq: Two times a day (BID) | INTRAMUSCULAR | Status: AC

## 2021-08-14 MED ORDER — LORAZEPAM 1 MG PO TABS: 1.0000 mg | ORAL_TABLET | ORAL | Status: AC | PRN

## 2021-08-14 MED ORDER — ONDANSETRON HCL 4 MG/2ML IJ SOLN: 4.0000 mg | Freq: Three times a day (TID) | INTRAMUSCULAR | Status: DC | PRN

## 2021-08-14 NOTE — H&P (Addendum)
History and Physical    Tanner [name] XOV:[id number] DOB: September 02, 1985 DOA: 08/14/2021  Referring MD/NP/PA:   PCP: Pcp, No   Patient coming from:  The patient is coming from home.  At baseline, pt is independent for most of ADL.        Chief Complaint: lethargy and weakness  HPI: Tanner [name] is a 36 y.o. male with medical history significant of anxiety, smoking, alcohol use, sciatica, who presents with weakness and lethargy.   Pt states he has hx of left sciatica with pain in left buttock and left leg.  Patient was seen here on 8/7 and was started on prednisone 50 mg daily.  Patient states that in the past several days, he feels more sleepy, lethargic with generalized weakness.  When I saw patient in ED, he is alert, oriented x 3.  Patient is not confused currently.  Patient has dry cough, mild shortness breath, no chest pain, fever or chills.  Patient is constipated, denies nausea, vomiting, diarrhea or abdominal pain.  He reports increased urinary frequency, no dysuria or burning on urination.     Data reviewed independently and ED Course: pt was found to have AKI with creatinine 6.60, BUN 73, GFR 11 (baseline creatinine 0.910 01/11/2020), and multiple electrolytes disturbance with hypercalcemia with calcium 14.3, hyponatremia with sodium 127, hypokalemia with potassium 3.4, magnesium 1.6, phosphorus> 30, abnormal liver function (ALP 52, AST 45, ALT 57, total bilirubin 1.3).  Temperature normal, blood pressure 125/73, heart rate 86, RR 16, oxygen saturation 100% on room air.  Chest x-ray negative.  US-renal showed no hydronephrosis and possible mild increase in renal echogenicity could reflect medical renal disease. Patient is admitted to telemetry bed as inpatient.  Dr. Holley Raring of renal is consulted.  EKG: Not done in ED, will get one.     Review of Systems: Patient has severe electrolyte disturbance  General: no fevers, chills, no body weight gain, has  fatigue HEENT: no blurry vision, hearing changes or sore throat Respiratory: no dyspnea, coughing, wheezing CV: no chest pain, no palpitations GI: no nausea, vomiting, abdominal pain, diarrhea, constipation GU: no dysuria, burning on urination, has increased urinary frequency, no hematuria  Ext: no leg edema Neuro: no unilateral weakness, numbness, or tingling, no vision change or hearing loss. Has lethargy. Skin: no rash, no skin tear. MSK: No muscle spasm, no deformity, no limitation of range of movement in spin Heme: No easy bruising.  Travel history: No recent long distant travel.   Allergy: No Known Allergies  Past Medical History:  Diagnosis Date   Alcohol use    Sciatica    Smoking     Past Surgical History:  Procedure Laterality Date   INGUINAL HERNIA REPAIR      Social History:  reports that he has been smoking e-cigarettes. He has never used smokeless tobacco. He reports current alcohol use of about 24.0 standard drinks of alcohol per week. He reports that he does not use drugs.  Family History:  Family History  Problem Relation Age of Onset   Breast cancer Mother    Alcoholism Father      Prior to Admission medications   Medication Sig Start Date End Date Taking? Authorizing Provider  cetirizine (ZYRTEC) 10 MG tablet Take 1 tablet (10 mg total) by mouth daily. 01/07/16   Cuthriell, Charline Bills, PA-C  diazepam (VALIUM) 2 MG tablet Take 1 tablet (2 mg total) by mouth every 8 (eight) hours as needed for muscle spasms. 03/26/21  Paulette Blanch, MD  fluticasone [facility]) 50 MCG/ACT nasal spray Place 1 spray into both nostrils 2 (two) times daily. 01/07/16   Cuthriell, Charline Bills, PA-C  ibuprofen (ADVIL,MOTRIN) 800 MG tablet Take 1 tablet (800 mg total) by mouth every 8 (eight) hours as needed. 10/05/16   Duanne Guess, PA-C  meloxicam (MOBIC) 15 MG tablet Take 1 tablet (15 mg total) by mouth daily. 03/26/21   Paulette Blanch, MD  predniSONE (DELTASONE) 50 MG tablet Take 1  tablet (50 mg total) by mouth daily with breakfast. 08/11/21   Delman Kitten, MD    Physical Exam: Vitals:   08/14/21 0858 08/14/21 0900 08/14/21 1236 08/14/21 1600  BP:  125/73    Pulse:  86    Resp:  16    Temp:  97.9 F (36.6 C) 98.1 F (36.7 C) (P) 97.8 F (36.6 C)  TempSrc:  Oral Oral (P) Oral  SpO2:  100% 100% (P) 100%  Weight: 108 kg     Height: 6' (1.829 m)      General: Not in acute distress.  Dry mucous membranes HEENT:       Eyes: PERRL, EOMI, no scleral icterus.       ENT: No discharge from the ears and nose, no pharynx injection, no tonsillar enlargement.        Neck: No JVD, no bruit, no mass felt. Heme: No neck lymph node enlargement. Cardiac: S1/S2, RRR, No murmurs, No gallops or rubs. Respiratory: No rales, wheezing, rhonchi or rubs. GI: Soft, nondistended, nontender, no rebound pain, no organomegaly, BS present. GU: No hematuria Ext: No pitting leg edema bilaterally. 1+DP/PT pulse bilaterally. Musculoskeletal: No joint deformities, No joint redness or warmth, no limitation of ROM in spin. Skin: No rashes.  Neuro: Alert, oriented X3, cranial nerves II-XII grossly intact, moves all extremities normally.   Psych: Patient is not psychotic, no suicidal or hemocidal ideation.  Labs on Admission: I have personally reviewed following labs and imaging studies  CBC: Recent Labs  Lab 08/14/21 0941  WBC 10.2  NEUTROABS 7.3  HGB 11.2*  HCT 33.0*  MCV 78.9*  PLT 828   Basic Metabolic Panel: Recent Labs  Lab 08/14/21 0941 08/14/21 1247  NA 127* 126*  K 3.4* 3.4*  CL 95* 97*  CO2 23 22  GLUCOSE 77 71  BUN 73* 69*  CREATININE 6.60* 6.41*  CALCIUM 14.3* 13.3*  MG 1.6*  --   PHOS >30.0*  --    GFR: Estimated Creatinine Clearance: 20.6 mL/min (A) (by C-G formula based on SCr of 6.41 mg/dL (H)). Liver Function Tests: Recent Labs  Lab 08/14/21 0941  AST 45*  ALT 57*  ALKPHOS 52  BILITOT 1.3*  PROT <3.0*  ALBUMIN 2.8*   No results for input(s):  "LIPASE", "AMYLASE" in the last 168 hours. Recent Labs  Lab 08/14/21 1247  AMMONIA 34   Coagulation Profile: Recent Labs  Lab 08/14/21 1247  INR 1.1   Cardiac Enzymes: Recent Labs  Lab 08/14/21 0941  CKTOTAL 301   BNP (last 3 results) No results for input(s): "PROBNP" in the last 8760 hours. HbA1C: No results for input(s): "HGBA1C" in the last 72 hours. CBG: Recent Labs  Lab 08/11/21 1558  GLUCAP 78   Lipid Profile: No results for input(s): "CHOL", "HDL", "LDLCALC", "TRIG", "CHOLHDL", "LDLDIRECT" in the last 72 hours. Thyroid Function Tests: No results for input(s): "TSH", "T4TOTAL", "FREET4", "T3FREE", "THYROIDAB" in the last 72 hours. Anemia Panel: Recent Labs    08/14/21  0941 08/14/21 1247  VITAMINB12  --  146*  FOLATE  --  9.1  FERRITIN  --  922*  TIBC  --  311  IRON  --  121  RETICCTPCT 0.9  --    Urine analysis:    Component Value Date/Time   COLORURINE AMBER (A) 08/11/2021 1600   APPEARANCEUR CLOUDY (A) 08/11/2021 1600   LABSPEC 1.023 08/11/2021 1600   PHURINE 5.0 08/11/2021 1600   GLUCOSEU NEGATIVE 08/11/2021 1600   HGBUR MODERATE (A) 08/11/2021 1600   BILIRUBINUR NEGATIVE 08/11/2021 1600   KETONESUR NEGATIVE 08/11/2021 1600   PROTEINUR 100 (A) 08/11/2021 1600   NITRITE NEGATIVE 08/11/2021 1600   LEUKOCYTESUR TRACE (A) 08/11/2021 1600   Sepsis Labs: _0 (procalcitonin:4,lacticidven:4) ) Recent Results (from the past 240 hour(s))  Resp Panel by RT-PCR (Flu A&B, Covid) Anterior Nasal Swab     Status: None   Collection Time: 08/11/21  2:39 PM   Specimen: Anterior Nasal Swab  Result Value Ref Range Status   SARS Coronavirus 2 by RT PCR NEGATIVE NEGATIVE Final    Comment: (NOTE) SARS-CoV-2 target nucleic acids are NOT DETECTED.  The SARS-CoV-2 RNA is generally detectable in upper respiratory specimens during the acute phase of infection. The lowest concentration of SARS-CoV-2 viral copies this assay can detect is 138 copies/mL. A  negative result does not preclude SARS-Cov-2 infection and should not be used as the sole basis for treatment or other patient management decisions. A negative result may occur with  improper specimen collection/handling, submission of specimen other than nasopharyngeal swab, presence of viral mutation(s) within the areas targeted by this assay, and inadequate number of viral copies(<138 copies/mL). A negative result must be combined with clinical observations, patient history, and epidemiological information. The expected result is Negative.  Fact Sheet for Patients:  EntrepreneurPulse.com.au  Fact Sheet for Healthcare Providers:  IncredibleEmployment.be  This test is no t yet approved or cleared by the [place] FDA and  has been authorized for detection and/or diagnosis of SARS-CoV-2 by FDA under an Emergency Use Authorization (EUA). This EUA will remain  in effect (meaning this test can be used) for the duration of the COVID-19 declaration under Section 564(b)(1) of the Act, 21 U.S.C.section 360bbb-3(b)(1), unless the authorization is terminated  or revoked sooner.       Influenza A by PCR NEGATIVE NEGATIVE Final   Influenza B by PCR NEGATIVE NEGATIVE Final    Comment: (NOTE) The Xpert Xpress SARS-CoV-2/FLU/RSV plus assay is intended as an aid in the diagnosis of influenza from Nasopharyngeal swab specimens and should not be used as a sole basis for treatment. Nasal washings and aspirates are unacceptable for Xpert Xpress SARS-CoV-2/FLU/RSV testing.  Fact Sheet for Patients: EntrepreneurPulse.com.au  Fact Sheet for Healthcare Providers: IncredibleEmployment.be  This test is not yet approved or cleared by the [place] FDA and has been authorized for detection and/or diagnosis of SARS-CoV-2 by FDA under an Emergency Use Authorization (EUA). This EUA will remain in effect (meaning this test can  be used) for the duration of the COVID-19 declaration under Section 564(b)(1) of the Act, 21 U.S.C. section 360bbb-3(b)(1), unless the authorization is terminated or revoked.  Performed at [facility], [address] [place], [place] [address]   Urine Culture     Status: Abnormal   Collection Time: 08/11/21  4:17 PM   Specimen: Urine, Random  Result Value Ref Range Status   Specimen Description   Final    URINE, RANDOM Performed at [facility],  [place], [place] [address]    Special Requests   Final    NONE Performed at [facility], [address] [place], [place] [address]    Culture (A)  Final    <10,000 COLONIES/mL INSIGNIFICANT GROWTH Performed at [facility] [address] [place], [place] [address]    Report Status 08/12/2021 FINAL  Final     Radiological Exams on Admission: US RENAL  Result Date: 08/14/2021 CLINICAL DATA:  Acute kidney injury EXAM: RENAL / URINARY TRACT ULTRASOUND COMPLETE COMPARISON:  None Available. FINDINGS: Right Kidney: Renal measurements: 12.4 x 6.2 x 7.2 cm = volume: 292 mL. Echogenicity may be mildly increased. No mass or hydronephrosis visualized. Left Kidney: Renal measurements: 12.2 x 6.6 x 5.8 cm = volume: 244 mL. Echogenicity may be mildly increased. No mass or hydronephrosis visualized. Bladder: Appears normal for degree of bladder distention. Other: None. IMPRESSION: No hydronephrosis. Possible mild increase in renal echogenicity could reflect medical renal disease. Electronically Signed   By: Macy Mis M.D.   On: 08/14/2021 12:17   DG Chest 2 View  Result Date: 08/14/2021 CLINICAL DATA:  Short of breath EXAM: CHEST - 2 VIEW COMPARISON:  01/11/2020 FINDINGS: The heart size and mediastinal contours are within normal limits. Both lungs are clear. The visualized skeletal structures are unremarkable. IMPRESSION: No active cardiopulmonary disease. Electronically Signed   By: Franchot Gallo M.D.   On: 08/14/2021 10:38      Assessment/Plan Principal Problem:   AKI (acute kidney injury) ([place]) Active Problems:   Electrolyte disturbance   Hypercalcemia   Hypokalemia   Hyponatremia   Hypomagnesemia   Hyperphosphatemia   Microcytic anemia   Smoking   Alcohol use   Abnormal LFTs   Sciatica   Assessment and Plan: * AKI (acute kidney injury) ([place]) Likely due to dehydration and continuation of NSAID (Advil and Mobic).  Other differential diagnosis include multiple myeloma and occult malignancy.  Renal ultrasound is negative for hydronephrosis.  Consulted Dr. Holley Raring of nephrology  -Admitted to telemetry bed as inpatient -IV fluid: 2 L normal saline, followed by 75 cc/h -Hold Advil and Mobic -check SPEP and UPEP -f/u UA -will get CT-chest/abd/pelvis w/o contrast   Electrolyte disturbance Patient has multiple electrolytes disturbance.  Etiology is not clear. -See below  Hypercalcemia Calcium 14.3 -- check intact PTH, PTH related protein, 1,25-hydroxy vitamin D - Start the patient on aggressive IV hydration - give 310 units of calcitonin - BMP every 6 hours  Hypokalemia K 3.4 -will not replete potassium due to severe AKI  Hyponatremia Sodium 127.  Likely due to alcohol abuse - Will check urine sodium, urine osmolality, serum osmolality. - IVF: 2L NS in ED, will continue with IV normal saline at 75 mL/h - f/u by BMP q6h - avoid over correction too fast due to risk of central pontine myelinolysis   Hypomagnesemia Mg 1.6 -Give 2 g of magnesium sulfate  Hyperphosphatemia Phosphorus> 30.  Etiology is not clear -Follow-up Dr. Elwyn Lade recommendation  Microcytic anemia Hemoglobin 11.2 (13.9 01/11/2020) -Anemia panel  Smoking - Did counseling about importance of quitting smoking -Nicotine patch  Alcohol use -Did counseling about importance of quitting alcohol use -CIWA protocol  Abnormal LFTs Patient has mild abnormal liver function, likely due  to alcohol abuse -Check hepatitis panel and HIV antibody -Avoid using Tylenol  Sciatica - As needed oxycodone -Hold prednisone          DVT ppx: SQ Heparin  Code Status: Full code  Family Communication: not done, no family member is at bed side.      Disposition Plan:  Anticipate discharge back to previous environment  Consults called:  Dr. Holley Raring of renal is consulted.  Admission status and Level of care: Telemetry Medical:  as inpt    Severity of Illness:  The appropriate patient status for this patient is INPATIENT. Inpatient status is judged to be reasonable and necessary in order to provide the required intensity of service to ensure the patient's safety. The patient's presenting symptoms, physical exam findings, and initial radiographic and laboratory data in the context of their chronic comorbidities is felt to place them at high risk for further clinical deterioration. Furthermore, it is not anticipated that the patient will be medically stable for discharge from the hospital within 2 midnights of admission.   * I certify that at the point of admission it is my clinical judgment that the patient will require inpatient hospital care spanning beyond 2 midnights from the point of admission due to high intensity of service, high risk for further deterioration and high frequency of surveillance required.*       Date of Service 08/14/2021    Ivor Costa [facility]   If 7PM-7AM, please contact night-coverage [email or web address] 08/14/2021, 4:22 PM

## 2021-08-14 NOTE — Assessment & Plan Note (Addendum)
Patient has mild abnormal liver function, likely due to alcohol abuse.  After several days, normalized.  Hepatitis panel and HIV negative.

## 2021-08-14 NOTE — Assessment & Plan Note (Signed)
-  Did counseling about importance of quitting alcohol use -CIWA protocol

## 2021-08-14 NOTE — Assessment & Plan Note (Addendum)
Multifactorial including significant dehydration caused by hypercalcemia/use of NSAIDs as well as multiple myeloma in itself.  Renal ultrasound is negative for hydronephrosis.  Nephrology following. With aggressive fluid resuscitation, creatinine continued to improve and by day of discharge down to 1.74 with GFR of 52.  Patient will have repeat labs including basic metabolic panel and renal function checked by oncology on 8/21.  Nephrology plans to follow-up with patient in the next few weeks.

## 2021-08-14 NOTE — Assessment & Plan Note (Addendum)
Replaced as needed.  Currently stable.

## 2021-08-14 NOTE — Assessment & Plan Note (Signed)
-   As needed oxycodone -Hold prednisone

## 2021-08-14 NOTE — ED Provider Notes (Signed)
[facility] Provider Note    Event Date/Time   First MD Initiated Contact with Patient 08/14/21 [phone]     (approximate)   History   work note   HPI  Tanner [name] is a 36 y.o. male   to the ED for a work note.  Patient states that he was seen in the ED on 08/11/2021 at which time he was seen for left sciatic radiculopathy lower extremity and was placed on prednisone 50 mg daily for the next 4 days.  Patient states that since starting this medication he has been drowsy, short of breath, and so sleepy he has been unable to go to work.  He denies any fever, chills, nausea or vomiting.  He denies any known exposure to COVID.  No one else in the family is sick at this time.  Patient reports that he generally drinks 2 40 ounce beers every other day but discontinued drinking to see if this would help with his left sciatica.  He suspects he has some difficulties with "alcoholism".  Patient denies use of recreational drugs but does smoke cigarettes.      Physical Exam   Triage Vital Signs: ED Triage Vitals  Enc Vitals Group     BP 08/14/21 0900 125/73     Pulse Rate 08/14/21 0900 86     Resp 08/14/21 0900 16     Temp 08/14/21 0900 97.9 F (36.6 C)     Temp Source 08/14/21 0900 Oral     SpO2 08/14/21 0900 100 %     Weight 08/14/21 0858 238 lb 1.6 oz (108 kg)     Height 08/14/21 0858 6' (1.829 m)     Head Circumference --      Peak Flow --      Pain Score 08/14/21 0858 0     Pain Loc --      Pain Edu? --      Excl. in [place]? --     Most recent vital signs: Vitals:   08/14/21 0900  BP: 125/73  Pulse: 86  Resp: 16  Temp: 97.9 F (36.6 C)  SpO2: 100%     General: Awake, no distress.  CV:  Good peripheral perfusion.  Resp:  Normal effort.  Lungs are clear bilaterally. Abd:  No distention. Abdomen soft, nontender, bowel sounds normoactive at present. Other:     ED Results / Procedures / Treatments   Labs (all labs ordered are listed, but  only abnormal results are displayed) Labs Reviewed  CBC WITH DIFFERENTIAL/PLATELET - Abnormal; Notable for the following components:      Result Value   RBC 4.18 (*)    Hemoglobin 11.2 (*)    HCT 33.0 (*)    MCV 78.9 (*)    Abs Immature Granulocytes 0.13 (*)    All other components within normal limits  COMPREHENSIVE METABOLIC PANEL - Abnormal; Notable for the following components:   Sodium 127 (*)    Potassium 3.4 (*)    Chloride 95 (*)    BUN 73 (*)    Creatinine, Ser 6.60 (*)    Calcium 14.3 (*)    Total Protein <3.0 (*)    Albumin 2.8 (*)    AST 45 (*)    ALT 57 (*)    Total Bilirubin 1.3 (*)    GFR, Estimated 11 (*)    All other components within normal limits  RETICULOCYTES - Abnormal; Notable for the following components:   RBC. 4.16 (*)  All other components within normal limits  CK  URINALYSIS, ROUTINE W REFLEX MICROSCOPIC  AMMONIA  URINE DRUG SCREEN, QUALITATIVE ([facility] ONLY)  PHOSPHORUS  MAGNESIUM  OSMOLALITY  OSMOLALITY, URINE  SODIUM, URINE, RANDOM  HEPATITIS PANEL, ACUTE  PARATHYROID HORMONE, INTACT (NO CA)  CALCITRIOL (1,25 DI-OH VIT D)  VITAMIN B12  FOLATE  IRON AND TIBC  FERRITIN  HIV ANTIBODY (ROUTINE TESTING W REFLEX)  PROTIME-INR  BASIC METABOLIC PANEL  BASIC METABOLIC PANEL  PTH-RELATED PEPTIDE     EKG  Pending   RADIOLOGY  Chest x-ray images were reviewed and interpreted by myself with no infiltrate or acute changes.  Radiology report is negative for acute cardiopulmonary process.   PROCEDURES:  Critical Care performed:   Procedures   MEDICATIONS ORDERED IN ED: Medications  0.9 %  sodium chloride infusion (has no administration in time range)  ondansetron (ZOFRAN) injection 4 mg (has no administration in time range)  oxyCODONE (Oxy IR/ROXICODONE) immediate release tablet 5 mg (has no administration in time range)  LORazepam (ATIVAN) tablet 1-4 mg (has no administration in time range)    Or  LORazepam (ATIVAN) injection  1-4 mg (has no administration in time range)  thiamine (VITAMIN B1) tablet 100 mg (has no administration in time range)    Or  thiamine (VITAMIN B1) injection 100 mg (has no administration in time range)  folic acid (FOLVITE) tablet 1 mg (has no administration in time range)  multivitamin with minerals tablet 1 tablet (has no administration in time range)  LORazepam (ATIVAN) injection 0-4 mg (has no administration in time range)    Followed by  LORazepam (ATIVAN) injection 0-4 mg (has no administration in time range)  nicotine (NICODERM CQ - dosed in mg/24 hours) patch 21 mg (has no administration in time range)  heparin injection 5,000 Units (has no administration in time range)  sodium chloride 0.9 % bolus 1,000 mL (0 mLs Intravenous Stopped 08/14/21 1145)  calcitonin (MIACALCIN) injection 310 Units (310 Units Intramuscular Given 08/14/21 1144)     IMPRESSION / MDM / ASSESSMENT AND PLAN / ED COURSE  I reviewed the triage vital signs and the nursing notes.   Differential diagnosis includes, but is not limited to, hypercalcemia, abnormal liver function, elevated BUN and creatinine, acute kidney injury, acute renal failure, hyponatremia.  36 year old male presents to the ED for a extension of his work note but mentioned that he is extremely fatigued after starting on the prednisone and is unable to go to work.  He also reports that he has had some shortness of breath, fatigue, mild constipation, drowsiness and also states he felt like he might pass out at this morning.  Basic labs showed elevation of his liver functions, renal functions and hypercalcemia.  All records from this hospital does not show a trend for abnormal labs.  Patient does not have a PCP and comes to the ED for all his medical care.  He denies any medical issues other than use of alcohol.  Patient is being admitted for further evaluation to determine cause of his abnormal studies.     Patient's presentation is most consistent  with acute presentation with potential threat to life or bodily function.  FINAL CLINICAL IMPRESSION(S) / ED DIAGNOSES   Final diagnoses:  Hypercalcemia  Fatigue, unspecified type  Abnormal laboratory test result     Rx / DC Orders   ED Discharge Orders     None        Note:  This document was prepared  using Systems analyst and may include unintentional dictation errors.   Johnn Hai, PA-C 08/14/21 1157    Nena Polio, MD 08/14/21 [phone]

## 2021-08-14 NOTE — Assessment & Plan Note (Addendum)
Replacing for Mg 1.5 today. Monitor Mg, replace as needed. 

## 2021-08-14 NOTE — Assessment & Plan Note (Addendum)
Secondary to increased bone turnover from multiple myeloma.  Has received calcitonin and aggressive IV hydration.  By day of discharge, creatinine down to 1.74.

## 2021-08-14 NOTE — Assessment & Plan Note (Signed)
Hemoglobin 11.2 (13.9 01/11/2020) -Anemia panel

## 2021-08-14 NOTE — ED Notes (Signed)
See triage note  states he was seen and placed on steroids couple of days ago  Has been feeling tired and "foggy" Denies any other sxs'

## 2021-08-14 NOTE — ED Triage Notes (Signed)
States he was seen and placed on prednisone last week  states the meds are making him sleepy  not able to work

## 2021-08-14 NOTE — Assessment & Plan Note (Addendum)
Patient counseled.  Offered nicotine patch upon discharge and he declined saying that he has not smoked since he has been in the hospital and has no desire to going forward

## 2021-08-14 NOTE — Assessment & Plan Note (Addendum)
Hypovolemic hyponatremia.  Improved and by day of discharge up to 134

## 2021-08-14 NOTE — Assessment & Plan Note (Addendum)
Patient has multiple electrolytes disturbance.  Etiology is not clear. -See below

## 2021-08-14 NOTE — Assessment & Plan Note (Addendum)
Phosphorus> 30.

## 2021-08-15 ENCOUNTER — Inpatient Hospital Stay: Payer: Commercial Managed Care - PPO

## 2021-08-15 DIAGNOSIS — N179 Acute kidney failure, unspecified: Secondary | ICD-10-CM | POA: Diagnosis not present

## 2021-08-15 DIAGNOSIS — M899 Disorder of bone, unspecified: Secondary | ICD-10-CM | POA: Insufficient documentation

## 2021-08-15 DIAGNOSIS — R5383 Other fatigue: Secondary | ICD-10-CM

## 2021-08-15 DIAGNOSIS — D509 Iron deficiency anemia, unspecified: Secondary | ICD-10-CM | POA: Diagnosis not present

## 2021-08-15 DIAGNOSIS — E538 Deficiency of other specified B group vitamins: Secondary | ICD-10-CM | POA: Diagnosis not present

## 2021-08-15 LAB — CBC WITH DIFFERENTIAL/PLATELET
Abs Immature Granulocytes: 0.17 10*3/uL — ABNORMAL HIGH (ref 0.00–0.07)
Basophils Absolute: 0 10*3/uL (ref 0.0–0.1)
Basophils Relative: 0 %
Eosinophils Absolute: 0.4 10*3/uL (ref 0.0–0.5)
Eosinophils Relative: 5 %
HCT: 29.6 % — ABNORMAL LOW (ref 39.0–52.0)
Hemoglobin: 10.1 g/dL — ABNORMAL LOW (ref 13.0–17.0)
Immature Granulocytes: 2 %
Lymphocytes Relative: 30 %
Lymphs Abs: 2.3 10*3/uL (ref 0.7–4.0)
MCH: 26.7 pg (ref 26.0–34.0)
MCHC: 34.1 g/dL (ref 30.0–36.0)
MCV: 78.3 fL — ABNORMAL LOW (ref 80.0–100.0)
Monocytes Absolute: 0.6 10*3/uL (ref 0.1–1.0)
Monocytes Relative: 8 %
Neutro Abs: 4.2 10*3/uL (ref 1.7–7.7)
Neutrophils Relative %: 55 %
Platelets: 199 10*3/uL (ref 150–400)
RBC: 3.78 MIL/uL — ABNORMAL LOW (ref 4.22–5.81)
RDW: 14.4 % (ref 11.5–15.5)
WBC: 7.8 10*3/uL (ref 4.0–10.5)
nRBC: 0 % (ref 0.0–0.2)

## 2021-08-15 LAB — URIC ACID: Uric Acid, Serum: 11.9 mg/dL — ABNORMAL HIGH (ref 3.7–8.6)

## 2021-08-15 LAB — BASIC METABOLIC PANEL
Anion gap: 4 — ABNORMAL LOW (ref 5–15)
Anion gap: 3 — ABNORMAL LOW (ref 5–15)
Anion gap: 4 — ABNORMAL LOW (ref 5–15)
Anion gap: 2 — ABNORMAL LOW (ref 5–15)
BUN: 68 mg/dL — ABNORMAL HIGH (ref 6–20)
BUN: 61 mg/dL — ABNORMAL HIGH (ref 6–20)
BUN: 58 mg/dL — ABNORMAL HIGH (ref 6–20)
BUN: 56 mg/dL — ABNORMAL HIGH (ref 6–20)
CO2: 23 mmol/L (ref 22–32)
CO2: 22 mmol/L (ref 22–32)
CO2: 21 mmol/L — ABNORMAL LOW (ref 22–32)
CO2: 23 mmol/L (ref 22–32)
Calcium: 11.1 mg/dL — ABNORMAL HIGH (ref 8.9–10.3)
Calcium: 11 mg/dL — ABNORMAL HIGH (ref 8.9–10.3)
Calcium: 11.1 mg/dL — ABNORMAL HIGH (ref 8.9–10.3)
Calcium: 11.2 mg/dL — ABNORMAL HIGH (ref 8.9–10.3)
Chloride: 101 mmol/L (ref 98–111)
Chloride: 103 mmol/L (ref 98–111)
Chloride: 104 mmol/L (ref 98–111)
Chloride: 102 mmol/L (ref 98–111)
Creatinine, Ser: 5.12 mg/dL — ABNORMAL HIGH (ref 0.61–1.24)
Creatinine, Ser: 4.48 mg/dL — ABNORMAL HIGH (ref 0.61–1.24)
Creatinine, Ser: 4.21 mg/dL — ABNORMAL HIGH (ref 0.61–1.24)
Creatinine, Ser: 3.79 mg/dL — ABNORMAL HIGH (ref 0.61–1.24)
GFR, Estimated: 14 mL/min — ABNORMAL LOW (ref >60)
GFR, Estimated: 17 mL/min — ABNORMAL LOW (ref >60)
GFR, Estimated: 18 mL/min — ABNORMAL LOW (ref >60)
GFR, Estimated: 20 mL/min — ABNORMAL LOW (ref >60)
Glucose, Bld: 78 mg/dL (ref 70–99)
Glucose, Bld: 105 mg/dL — ABNORMAL HIGH (ref 70–99)
Glucose, Bld: 104 mg/dL — ABNORMAL HIGH (ref 70–99)
Glucose, Bld: 113 mg/dL — ABNORMAL HIGH (ref 70–99)
Potassium: 3.3 mmol/L — ABNORMAL LOW (ref 3.5–5.1)
Potassium: 3.4 mmol/L — ABNORMAL LOW (ref 3.5–5.1)
Potassium: 3.5 mmol/L (ref 3.5–5.1)
Potassium: 3.5 mmol/L (ref 3.5–5.1)
Sodium: 128 mmol/L — ABNORMAL LOW (ref 135–145)
Sodium: 128 mmol/L — ABNORMAL LOW (ref 135–145)
Sodium: 129 mmol/L — ABNORMAL LOW (ref 135–145)
Sodium: 127 mmol/L — ABNORMAL LOW (ref 135–145)

## 2021-08-15 LAB — TECHNOLOGIST SMEAR REVIEW: Plt Morphology: NORMAL

## 2021-08-15 LAB — PHOSPHORUS: Phosphorus: 3.9 mg/dL (ref 2.5–4.6)

## 2021-08-15 LAB — MAGNESIUM: Magnesium: 1.8 mg/dL (ref 1.7–2.4)

## 2021-08-15 LAB — CBC
HCT: 30.3 % — ABNORMAL LOW (ref 39.0–52.0)
Hemoglobin: 10.5 g/dL — ABNORMAL LOW (ref 13.0–17.0)
MCH: 27.1 pg (ref 26.0–34.0)
MCHC: 34.7 g/dL (ref 30.0–36.0)
MCV: 78.1 fL — ABNORMAL LOW (ref 80.0–100.0)
Platelets: 194 10*3/uL (ref 150–400)
RBC: 3.88 MIL/uL — ABNORMAL LOW (ref 4.22–5.81)
RDW: 14.4 % (ref 11.5–15.5)
WBC: 8.5 10*3/uL (ref 4.0–10.5)
nRBC: 0 % (ref 0.0–0.2)

## 2021-08-15 LAB — CALCITRIOL (1,25 DI-OH VIT D): Vit D, 1,25-Dihydroxy: <5 pg/mL — ABNORMAL LOW (ref 24.8–81.5)

## 2021-08-15 LAB — LACTATE DEHYDROGENASE: LDH: 85 U/L — ABNORMAL LOW (ref 98–192)

## 2021-08-15 MED ORDER — OXYCODONE-ACETAMINOPHEN 5-325 MG PO TABS: 1.0000 | ORAL_TABLET | Freq: Four times a day (QID) | ORAL | Status: DC | PRN

## 2021-08-15 MED ORDER — MIDAZOLAM HCL 2 MG/2ML IJ SOLN
INTRAMUSCULAR | Status: DC
Start: 2021-08-15 — End: 2021-08-15
  Filled 2021-08-15: qty 4

## 2021-08-15 MED ORDER — ACETAMINOPHEN 325 MG PO TABS
650.0000 mg | ORAL_TABLET | Freq: Four times a day (QID) | ORAL | Status: DC | PRN
  Administered 2021-08-15 – 2021-08-22 (×7): 650 mg via ORAL
  Filled 2021-08-15 (×7): qty 2

## 2021-08-15 MED ORDER — MIDAZOLAM HCL 2 MG/2ML IJ SOLN
INTRAMUSCULAR | Status: AC | PRN
  Administered 2021-08-15: 1 mg via INTRAVENOUS

## 2021-08-15 MED ORDER — HEPARIN SOD (PORK) LOCK FLUSH 100 UNIT/ML IV SOLN
INTRAVENOUS | Status: AC
  Filled 2021-08-15: qty 5

## 2021-08-15 MED ORDER — FENTANYL CITRATE (PF) 100 MCG/2ML IJ SOLN
INTRAMUSCULAR | Status: AC
  Filled 2021-08-15: qty 2

## 2021-08-15 MED ORDER — VITAMIN B-12 1000 MCG PO TABS
1000.0000 ug | ORAL_TABLET | Freq: Every day | ORAL | Status: DC
  Administered 2021-08-15 – 2021-08-22 (×8): 1000 ug via ORAL
  Filled 2021-08-15 (×8): qty 1

## 2021-08-15 MED ORDER — FENTANYL CITRATE (PF) 100 MCG/2ML IJ SOLN
INTRAMUSCULAR | Status: AC | PRN
  Administered 2021-08-15 (×2): 50 ug via INTRAVENOUS

## 2021-08-15 MED ORDER — MORPHINE SULFATE (PF) 2 MG/ML IV SOLN: 1.0000 mg | INTRAVENOUS | Status: DC | PRN

## 2021-08-15 NOTE — Consult Note (Signed)
[facility]  CONSULT NOTE    Date: 08/15/2021                  Tanner [name] Name:  Tanner [name]  MRN: [id number]  DOB: [birth year]  Age / Sex: 36 y.o., male         PCP: Pcp, No                 Service Requesting Consult: [place]                 Reason for Consult: Acute kidney injury and severe hypercalcemia             History of Present Illness: Tanner [name] is a 36 y.o.  male with past medical history of alcohol and tobacco use, anxiety, and sciatica, who was admitted to [facility] on 08/14/2021 for Hypercalcemia [E83.52] Electrolyte disturbance [E87.8] Abnormal laboratory test result [R89.9] AKI (acute kidney injury) ([place]) [N17.9] Fatigue, unspecified type [R53.83]  Tanner [name] is seen and evaluated at bedside.  Mother and father at bedside, present during the visit.  Tanner [name] states he presented to the emergency department with complaints of leg pain described as shooting down his left leg.  Tanner [name]'s father states that he failed while walking to the car and maintained an unsteady gait.  Tanner [name] states he was recently in the emergency department with with these complaints and received steroids and NSAIDs for management.  Reports only filling the steroid prescription.  Tanner [name] states while taking steroids he became very drowsy and sweaty.  Currently works at [employer] and was sent home due to the symptoms.  States he has maintained an appropriate appetite.  Denies nausea and vomiting.  Denies shortness of breath, remains on room air.  No lower extremity edema.  Denies recent use of antacids.  Labs on ED arrival include sodium 127, potassium 3.4, BUN 73, creatinine 6.60 with GFR 11, calcium 14.3, phosphorus greater than 30, magnesium 1.6, and albumin 2.8.  Hemoglobin currently 11.2.  Chest x-ray negative.  Renal ultrasound negative for obstruction.  CT abdomen pelvis shows numerous abnormalities and bony structures suspicious for metastasis or  myeloma.   Medications: Outpatient medications: Medications Prior to Admission  Medication Sig Dispense Refill Last Dose   predniSONE (DELTASONE) 50 MG tablet Take 1 tablet (50 mg total) by mouth daily with breakfast. 4 tablet 0 08/13/2021   cetirizine (ZYRTEC) 10 MG tablet Take 1 tablet (10 mg total) by mouth daily. (Tanner [name] not taking: Reported on 08/14/2021) 30 tablet 0 Not Taking   diazepam (VALIUM) 2 MG tablet Take 1 tablet (2 mg total) by mouth every 8 (eight) hours as needed for muscle spasms. (Tanner [name] not taking: Reported on 08/14/2021) 15 tablet 0 Not Taking   fluticasone (FLONASE) 50 MCG/ACT nasal spray Place 1 spray into both nostrils 2 (two) times daily. (Tanner [name] not taking: Reported on 08/14/2021) 16 g 0 Not Taking   ibuprofen (ADVIL,MOTRIN) 800 MG tablet Take 1 tablet (800 mg total) by mouth every 8 (eight) hours as needed. (Tanner [name] not taking: Reported on 08/14/2021) 30 tablet 0 Not Taking   meloxicam (MOBIC) 15 MG tablet Take 1 tablet (15 mg total) by mouth daily. (Tanner [name] not taking: Reported on 08/14/2021) 7 tablet 0 Not Taking    Current medications: Current Facility-Administered Medications  Medication Dose Route Frequency Provider Last Rate Last Admin   0.9 %  sodium chloride infusion   Intravenous Continuous Ivor Costa, MD 75 mL/hr at 08/15/21 0520 New Bag at 08/15/21  5993   acetaminophen (TYLENOL) tablet 650 mg  650 mg Oral Q6H PRN Wyvonnia Dusky, MD   650 mg at 57/01/77 9390   folic acid (FOLVITE) tablet 1 mg  1 mg Oral Daily Ivor Costa, MD   1 mg at 08/15/21 3009   heparin injection 5,000 Units  5,000 Units Subcutaneous Q8H Ivor Costa, MD   5,000 Units at 08/15/21 1309   LORazepam (ATIVAN) injection 0-4 mg  0-4 mg Intravenous Q6H Ivor Costa, MD       Followed by   Derrill Memo ON 08/16/2021] LORazepam (ATIVAN) injection 0-4 mg  0-4 mg Intravenous Q12H Ivor Costa, MD       LORazepam (ATIVAN) tablet 1-4 mg  1-4 mg Oral Q1H PRN Ivor Costa, MD       Or   LORazepam (ATIVAN)  injection 1-4 mg  1-4 mg Intravenous Q1H PRN Ivor Costa, MD       morphine (PF) 2 MG/ML injection 1 mg  1 mg Intravenous Q4H PRN Wyvonnia Dusky, MD       multivitamin with minerals tablet 1 tablet  1 tablet Oral Daily Ivor Costa, MD   1 tablet at 08/15/21 2330   nicotine (NICODERM CQ - dosed in mg/24 hours) patch 21 mg  21 mg Transdermal Daily Ivor Costa, MD       ondansetron [facility]) injection 4 mg  4 mg Intravenous Q8H PRN Ivor Costa, MD       oxyCODONE (Oxy IR/ROXICODONE) immediate release tablet 5 mg  5 mg Oral Q6H PRN Ivor Costa, MD       oxyCODONE-acetaminophen (PERCOCET/ROXICET) 5-325 MG per tablet 1 tablet  1 tablet Oral Q6H PRN Wyvonnia Dusky, MD       polyethylene glycol (MIRALAX / GLYCOLAX) packet 17 g  17 g Oral Daily PRN Ivor Costa, MD       senna-docusate (Senokot-S) tablet 1 tablet  1 tablet Oral BID Ivor Costa, MD   1 tablet at 08/14/21 2144   thiamine (VITAMIN B1) tablet 100 mg  100 mg Oral Daily Ivor Costa, MD   100 mg at 08/15/21 0762   Or   thiamine (VITAMIN B1) injection 100 mg  100 mg Intravenous Daily Ivor Costa, MD          Allergies: No Known Allergies    Past Medical History: Past Medical History:  Diagnosis Date   Alcohol use    Sciatica    Smoking      Past Surgical History: Past Surgical History:  Procedure Laterality Date   INGUINAL HERNIA REPAIR       Family History: Family History  Problem Relation Age of Onset   Breast cancer Mother    Alcoholism Father      Social History: Social History   Socioeconomic History   Marital status: Single    Spouse name: Not on file   Number of children: Not on file   Years of education: Not on file   Highest education level: Not on file  Occupational History   Not on file  Tobacco Use   Smoking status: Every Day    Types: E-cigarettes   Smokeless tobacco: Never  Vaping Use   Vaping Use: Every day  Substance and Sexual Activity   Alcohol use: Yes    Alcohol/week: 24.0 standard  drinks of alcohol    Types: 24 Cans of beer per week   Drug use: Never   Sexual activity: Not on file  Other Topics Concern  Not on file  Social History Narrative   Not on file   Social Determinants of Health   Financial Resource Strain: Not on file  Food Insecurity: Not on file  Transportation Needs: Not on file  Physical Activity: Not on file  Stress: Not on file  Social Connections: Not on file  Intimate Partner Violence: Not on file     Review of Systems: Review of Systems  Constitutional:  Positive for malaise/fatigue. Negative for chills and fever.  HENT:  Negative for congestion, sore throat and tinnitus.   Eyes:  Negative for blurred vision and redness.  Respiratory:  Negative for cough, shortness of breath and wheezing.   Cardiovascular:  Negative for chest pain, palpitations, claudication and leg swelling.  Gastrointestinal:  Negative for abdominal pain, blood in stool, diarrhea, nausea and vomiting.  Genitourinary:  Negative for flank pain, frequency and hematuria.  Musculoskeletal:  Negative for back pain, falls and myalgias.  Skin:  Negative for rash.  Neurological:  Negative for dizziness, weakness and headaches.  Endo/Heme/Allergies:  Does not bruise/bleed easily.  Psychiatric/Behavioral:  Negative for depression. The Tanner [name] is not nervous/anxious and does not have insomnia.     Vital Signs: Blood pressure 122/76, pulse 83, temperature 97.9 F (36.6 C), resp. rate 18, height 6' (1.829 m), weight 108 kg, SpO2 98 %.  Weight trends: Filed Weights   08/14/21 0858  Weight: 108 kg    Physical Exam: General: NAD, sitting up in bed  Head: Normocephalic, atraumatic. Moist oral mucosal membranes  Eyes: Anicteric  Neck: Supple  Lungs:  Clear to auscultation, normal effort, room air  Heart: Regular rate and rhythm  Abdomen:  Soft, nontender, nondistended  Extremities: No peripheral edema.  Neurologic: Nonfocal, moving all four extremities  Skin: No  lesions  Access: None     Lab results: Basic Metabolic Panel: Recent Labs  Lab 08/14/21 0941 08/14/21 1247 08/15/21 0219 08/15/21 0847 08/15/21 1404  NA 127*   < > 128* 128* 129*  K 3.4*   < > 3.3* 3.4* 3.5  CL 95*   < > 101 103 104  CO2 23   < > 23 22 21*  GLUCOSE 77   < > 78 105* 104*  BUN 73*   < > 68* 61* 58*  CREATININE 6.60*   < > 5.12* 4.48* 4.21*  CALCIUM 14.3*   < > 11.1* 11.0* 11.1*  MG 1.6*  --  1.8  --   --   PHOS >30.0*  --  3.9  --   --    < > = values in this interval not displayed.    Liver Function Tests: Recent Labs  Lab 08/14/21 0941  AST 45*  ALT 57*  ALKPHOS 52  BILITOT 1.3*  PROT <3.0*  ALBUMIN 2.8*   No results for input(s): "LIPASE", "AMYLASE" in the last 168 hours. Recent Labs  Lab 08/14/21 1247  AMMONIA 34    CBC: Recent Labs  Lab 08/14/21 0941 08/15/21 0219  WBC 10.2 8.5  NEUTROABS 7.3  --   HGB 11.2* 10.5*  HCT 33.0* 30.3*  MCV 78.9* 78.1*  PLT 206 194    Cardiac Enzymes: Recent Labs  Lab 08/14/21 0941  CKTOTAL 301    BNP: Invalid input(s): "POCBNP"  CBG: Recent Labs  Lab 08/11/21 1558  [place] 78    Microbiology: Results for orders placed or performed during the hospital encounter of 08/11/21  Resp Panel by RT-PCR (Flu A&B, Covid) Anterior Nasal Swab  Status: None   Collection Time: 08/11/21  2:39 PM   Specimen: Anterior Nasal Swab  Result Value Ref Range Status   SARS Coronavirus 2 by RT PCR NEGATIVE NEGATIVE Final    Comment: (NOTE) SARS-CoV-2 target nucleic acids are NOT DETECTED.  The SARS-CoV-2 RNA is generally detectable in upper respiratory specimens during the acute phase of infection. The lowest concentration of SARS-CoV-2 viral copies this assay can detect is 138 copies/mL. A negative result does not preclude SARS-Cov-2 infection and should not be used as the sole basis for treatment or other Tanner [name] management decisions. A negative result may occur with  improper specimen  collection/handling, submission of specimen other than nasopharyngeal swab, presence of viral mutation(s) within the areas targeted by this assay, and inadequate number of viral copies(<138 copies/mL). A negative result must be combined with clinical observations, Tanner [name] history, and epidemiological information. The expected result is Negative.  Fact Sheet for Patients:  EntrepreneurPulse.com.au  Fact Sheet for Healthcare Providers:  IncredibleEmployment.be  This test is no t yet approved or cleared by the [place] FDA and  has been authorized for detection and/or diagnosis of SARS-CoV-2 by FDA under an Emergency Use Authorization (EUA). This EUA will remain  in effect (meaning this test can be used) for the duration of the COVID-19 declaration under Section 564(b)(1) of the Act, 21 U.S.C.section 360bbb-3(b)(1), unless the authorization is terminated  or revoked sooner.       Influenza A by PCR NEGATIVE NEGATIVE Final   Influenza B by PCR NEGATIVE NEGATIVE Final    Comment: (NOTE) The Xpert Xpress SARS-CoV-2/FLU/RSV plus assay is intended as an aid in the diagnosis of influenza from Nasopharyngeal swab specimens and should not be used as a sole basis for treatment. Nasal washings and aspirates are unacceptable for Xpert Xpress SARS-CoV-2/FLU/RSV testing.  Fact Sheet for Patients: EntrepreneurPulse.com.au  Fact Sheet for Healthcare Providers: IncredibleEmployment.be  This test is not yet approved or cleared by the [place] FDA and has been authorized for detection and/or diagnosis of SARS-CoV-2 by FDA under an Emergency Use Authorization (EUA). This EUA will remain in effect (meaning this test can be used) for the duration of the COVID-19 declaration under Section 564(b)(1) of the Act, 21 U.S.C. section 360bbb-3(b)(1), unless the authorization is terminated or revoked.  Performed at [facility], [address], [place], [place] [address]   Urine Culture     Status: Abnormal   Collection Time: 08/11/21  4:17 PM   Specimen: Urine, Random  Result Value Ref Range Status   Specimen Description   Final    URINE, RANDOM Performed at [facility], [address] [place], [place] [address]    Special Requests   Final    NONE Performed at [facility], [address] [place], [place] [address]    Culture (A)  Final    <10,000 COLONIES/mL INSIGNIFICANT GROWTH Performed at [facility], [place] [address], [place],  [address]    Report Status 08/12/2021 FINAL  Final    Coagulation Studies: Recent Labs    08/14/21 1247  LABPROT 14.3  INR 1.1    Urinalysis: Recent Labs    08/14/21 1620  COLORURINE STRAW*  LABSPEC 1.006  PHURINE 5.0  GLUCOSEU NEGATIVE  HGBUR MODERATE*  BILIRUBINUR NEGATIVE  KETONESUR NEGATIVE  PROTEINUR NEGATIVE  NITRITE NEGATIVE  LEUKOCYTESUR NEGATIVE      Imaging: CT BONE MARROW BIOPSY & ASPIRATION  Result Date: 08/15/2021 INDICATION: Concern for multiple myeloma EXAM:  CT BONE MARROW BIOPSY AND ASPIRATION MEDICATIONS: None. ANESTHESIA/SEDATION: Moderate (conscious) sedation was employed during this procedure. A total of Versed 1 mg and Fentanyl 100 mcg was administered intravenously. Moderate Sedation Time: 15 minutes. The Tanner [name]'s level of consciousness and vital signs were monitored continuously by radiology nursing throughout the procedure under my direct supervision. FLUOROSCOPY TIME:  N/a COMPLICATIONS: None immediate. PROCEDURE: Informed written consent was obtained from the Tanner [name] after a thorough discussion of the procedural risks, benefits and alternatives. All questions were addressed. Maximal Sterile Barrier Technique was utilized including caps, mask, sterile gowns, sterile gloves, sterile drape, hand hygiene and skin antiseptic. A timeout was performed prior to the initiation of the procedure.  The Tanner [name] was placed prone on the CT exam table. Limited CT of the pelvis was performed for planning purposes. Skin entry site was marked, and the overlying skin was prepped and draped in the standard sterile fashion. Local analgesia was obtained with 1% lidocaine. Using CT guidance, an 11 gauge needle was advanced just deep to the cortex of the right posterior ilium. Subsequently, bone marrow aspiration and core biopsy were performed. Specimens were submitted to lab/pathology for handling. Hemostasis was achieved with manual pressure, and a clean dressing was placed. The Tanner [name] tolerated the procedure well without immediate complication. IMPRESSION: Successful CT-guided bone marrow aspiration core biopsy of the right posterior ilium. Electronically Signed   By: Albin Felling M.D.   On: 08/15/2021 13:36   CT CHEST ABDOMEN PELVIS WO CONTRAST  Result Date: 08/14/2021 CLINICAL DATA:  Weakness, lethargy.  Evaluate for occult malignancy. EXAM: CT CHEST, ABDOMEN AND PELVIS WITHOUT CONTRAST TECHNIQUE: Multidetector CT imaging of the chest, abdomen and pelvis was performed following the standard protocol without IV contrast. RADIATION DOSE REDUCTION: This exam was performed according to the departmental dose-optimization program which includes automated exposure control, adjustment of the mA and/or kV according to Tanner [name] size and/or use of iterative reconstruction technique. COMPARISON:  None Available. FINDINGS: CT CHEST FINDINGS Cardiovascular: Heart is normal size. Aorta is normal caliber. Mediastinum/Nodes: No mediastinal, hilar, or axillary adenopathy. Trachea and esophagus are unremarkable. Thyroid unremarkable. Lungs/Pleura: Lungs are clear. No focal airspace opacities or suspicious nodules. No effusions. Musculoskeletal: Chest wall soft tissues are unremarkable. Mild appearance with areas of lucency throughout the thoracic spine, sternum and ribs. This can be seen with metastases or myeloma. CT ABDOMEN  PELVIS FINDINGS Hepatobiliary: No focal hepatic abnormality. Gallbladder unremarkable. Pancreas: No focal abnormality or ductal dilatation. Spleen: No focal abnormality.  Normal size. Adrenals/Urinary Tract: No adrenal abnormality. No focal renal abnormality. No stones or hydronephrosis. Urinary bladder is unremarkable. Stomach/Bowel: Sigmoid diverticulosis. No active diverticulitis. Stomach and small bowel decompressed, unremarkable. Normal appendix. Vascular/Lymphatic: No evidence of aneurysm or adenopathy. Reproductive: No visible focal abnormality. Other: No free fluid or free air. Musculoskeletal: Lucencies noted throughout the lumbar spine and bony pelvis, best seen in the left iliac bone. There is cortical destruction seen in the left iliac bone. This could be seen with metastasis or myeloma. IMPRESSION: Numerous lucencies throughout the bony structures, most pronounced throughout the spine and in the left iliac bone. This could reflect metastases or myeloma. Otherwise no acute findings in the chest, abdomen or pelvis. Sigmoid diverticulosis. Electronically Signed   By: Rolm Baptise M.D.   On: 08/14/2021 18:24   US RENAL  Result Date: 08/14/2021 CLINICAL DATA:  Acute kidney injury EXAM: RENAL / URINARY TRACT ULTRASOUND COMPLETE COMPARISON:  None Available. FINDINGS: Right Kidney: Renal measurements: 12.4 x 6.2 x 7.2 cm =  volume: 292 mL. Echogenicity may be mildly increased. No mass or hydronephrosis visualized. Left Kidney: Renal measurements: 12.2 x 6.6 x 5.8 cm = volume: 244 mL. Echogenicity may be mildly increased. No mass or hydronephrosis visualized. Bladder: Appears normal for degree of bladder distention. Other: None. IMPRESSION: No hydronephrosis. Possible mild increase in renal echogenicity could reflect medical renal disease. Electronically Signed   By: Macy Mis M.D.   On: 08/14/2021 12:17   DG Chest 2 View  Result Date: 08/14/2021 CLINICAL DATA:  Short of breath EXAM: CHEST - 2 VIEW  COMPARISON:  01/11/2020 FINDINGS: The heart size and mediastinal contours are within normal limits. Both lungs are clear. The visualized skeletal structures are unremarkable. IMPRESSION: No active cardiopulmonary disease. Electronically Signed   By: Franchot Gallo M.D.   On: 08/14/2021 10:38     Assessment & Plan: Mr. Alioune Hodgkin is a 36 y.o.  male with past medical history of alcohol and tobacco use, anxiety, and sciatica, who was admitted to [facility] on 08/14/2021 for Hypercalcemia [E83.52] Electrolyte disturbance [E87.8] Abnormal laboratory test result [R89.9] AKI (acute kidney injury) ([place]) [N17.9] Fatigue, unspecified type [R53.83]   Acute kidney injury likely due to severe illness, suspected multiple myeloma.  CT abdomen pelvis suspicious for metastasis or multiple myeloma.  Renal ultrasound negative for hydronephrosis.  No IV contrast exposure.  Denies recent NSAID use.  We will consult medical oncology for further evaluation.  We will order ANA, ANCA, and SPEP UPEP.  Creatinine 6.6 on admission.  Creatinine improving with IV fluids.  Tanner [name] encouraged to maintain oral intake.  Continue to avoid nephrotoxic agents and therapies if possible.  No acute need for dialysis at this time.  2.  Hypercalcemia believed due to abnormal CT scan suspicious for multiple myeloma or metastasis.  Other differential diagnosis includes sarcoidosis and overactive parathyroid.  Calcium on admission 14.3.  Improving with calcitonin and IV fluids .  PTH ordered.  Consulted medical oncology.  Requested bone marrow biopsy, completed today.  3.  Hyponatremia/hypokalemia.Sodium 127 and potassium 3.4 on admission.  Sodium slowly correcting with IV fluids.  Potassium corrected with supplementation.  LOS: 1   8/11/20232:52 PM

## 2021-08-15 NOTE — Consult Note (Addendum)
Hematology/Oncology Consult note [phone] [phone]      Patient Care Team: Pcp, No as PCP - General   Name of the patient: Tanner [name]  [id number]  02-Apr-1985   Date of visit: 08/15/21 REASON FOR COSULTATION:  AKI, hypercalcemia, bone lesions History of presenting illness-  36 y.o. male with PMH listed at below who presents to ER for evaluation of drowsiness, shortness of breath. He presented to emergency room on 08/11/2021 for left lower buttock pain shooting down to his lower extremity.  Patient was prescribed prednisone 50 mg.  He returned to ER on 08/14/2021 feeling that after taking steroids, he feels even worse, not able to work He has had chronic lower back pain.  He presented emergency room the March 2023 for lower back pain evaluation.  MRI lumbar spine without contrast showed small left subarticular disc extrusion at L5-S1 contacting the left S1 nerve root in the lateral recess.  Blood work done at the emergency room showed acute kidney failure, creatinine 6, BUN 73, electrolyte disturbance.  Hypercalcemia of 14.3 phosphorus of 30.,  Hypokalemia 3.4.  Hyponatremia 127.  Patient reports decreased appetite, occasionally nausea.  No vomiting, diarrhea, fever or chills. He does not know if he has any unintentional weight loss or not.  No Known Allergies  Patient Active Problem List   Diagnosis Date Noted   Electrolyte disturbance 08/14/2021   Hypercalcemia 08/14/2021   Hypokalemia 08/14/2021   Hyponatremia 08/14/2021   Microcytic anemia 08/14/2021   Smoking 08/14/2021   Alcohol use 08/14/2021   Sciatica 08/14/2021   Abnormal LFTs 08/14/2021   AKI (acute kidney injury) ([place]) 08/14/2021   Hypomagnesemia 08/14/2021   Hyperphosphatemia 08/14/2021     Past Medical History:  Diagnosis Date   Alcohol use    Sciatica    Smoking      Past Surgical History:  Procedure Laterality Date   INGUINAL HERNIA REPAIR      Social History    Socioeconomic History   Marital status: Single    Spouse name: Not on file   Number of children: Not on file   Years of education: Not on file   Highest education level: Not on file  Occupational History   Not on file  Tobacco Use   Smoking status: Every Day    Types: E-cigarettes   Smokeless tobacco: Never  Vaping Use   Vaping Use: Every day  Substance and Sexual Activity   Alcohol use: Yes    Alcohol/week: 24.0 standard drinks of alcohol    Types: 24 Cans of beer per week   Drug use: Never   Sexual activity: Not on file  Other Topics Concern   Not on file  Social History Narrative   Not on file   Social Determinants of Health   Financial Resource Strain: Not on file  Food Insecurity: Not on file  Transportation Needs: Not on file  Physical Activity: Not on file  Stress: Not on file  Social Connections: Not on file  Intimate Partner Violence: Not on file     Family History  Problem Relation Age of Onset   Breast cancer Mother    Alcoholism Father      Current Facility-Administered Medications:    0.9 %  sodium chloride infusion, , Intravenous, Continuous, Ivor Costa, MD, Last Rate: 75 mL/hr at 08/15/21 1501, Infusion Verify at 08/15/21 1501   acetaminophen (TYLENOL) tablet 650 mg, 650 mg, Oral, Q6H PRN, Wyvonnia Dusky, MD, 650 mg at 08/15/21 1320  folic acid (FOLVITE) tablet 1 mg, 1 mg, Oral, Daily, Ivor Costa, MD, 1 mg at 08/15/21 4665   heparin injection 5,000 Units, 5,000 Units, Subcutaneous, Q8H, Ivor Costa, MD, 5,000 Units at 08/15/21 1309   LORazepam (ATIVAN) injection 0-4 mg, 0-4 mg, Intravenous, Q6H **FOLLOWED BY** [START ON 08/16/2021] LORazepam (ATIVAN) injection 0-4 mg, 0-4 mg, Intravenous, Q12H, Ivor Costa, MD   LORazepam (ATIVAN) tablet 1-4 mg, 1-4 mg, Oral, Q1H PRN **OR** LORazepam (ATIVAN) injection 1-4 mg, 1-4 mg, Intravenous, Q1H PRN, Ivor Costa, MD   morphine (PF) 2 MG/ML injection 1 mg, 1 mg, Intravenous, Q4H PRN, Wyvonnia Dusky,  MD   multivitamin with minerals tablet 1 tablet, 1 tablet, Oral, Daily, Ivor Costa, MD, 1 tablet at 08/15/21 9935   nicotine (NICODERM CQ - dosed in mg/24 hours) patch 21 mg, 21 mg, Transdermal, Daily, Ivor Costa, MD   ondansetron (ZOFRAN) injection 4 mg, 4 mg, Intravenous, Q8H PRN, Ivor Costa, MD   oxyCODONE (Oxy IR/ROXICODONE) immediate release tablet 5 mg, 5 mg, Oral, Q6H PRN, Ivor Costa, MD   oxyCODONE-acetaminophen (PERCOCET/ROXICET) 5-325 MG per tablet 1 tablet, 1 tablet, Oral, Q6H PRN, Wyvonnia Dusky, MD   polyethylene glycol (MIRALAX / GLYCOLAX) packet 17 g, 17 g, Oral, Daily PRN, Ivor Costa, MD   senna-docusate (Senokot-S) tablet 1 tablet, 1 tablet, Oral, BID, Ivor Costa, MD, 1 tablet at 08/14/21 2144   thiamine (VITAMIN B1) tablet 100 mg, 100 mg, Oral, Daily, 100 mg at 08/15/21 0811 **OR** thiamine (VITAMIN B1) injection 100 mg, 100 mg, Intravenous, Daily, Ivor Costa, MD  Review of Systems  Constitutional:  Positive for appetite change and fatigue. Negative for chills, fever and unexpected weight change.  HENT:   Negative for hearing loss and voice change.   Eyes:  Negative for eye problems and icterus.  Respiratory:  Negative for chest tightness, cough and shortness of breath.   Cardiovascular:  Negative for chest pain and leg swelling.  Gastrointestinal:  Positive for nausea. Negative for abdominal distention and abdominal pain.  Endocrine: Negative for hot flashes.  Genitourinary:  Negative for difficulty urinating, dysuria and frequency.   Musculoskeletal:  Positive for back pain. Negative for arthralgias.  Skin:  Negative for itching and rash.  Neurological:  Negative for light-headedness and numbness.  Hematological:  Negative for adenopathy. Does not bruise/bleed easily.  Psychiatric/Behavioral:  Negative for confusion.     Physical exam:  Vitals:   08/15/21 1100 08/15/21 1105 08/15/21 1115 08/15/21 1139  BP: 137/74 138/81 120/73 122/76  Pulse: 92 85 81 83  Resp:  18 20  18   Temp:    97.9 F (36.6 C)  TempSrc:      SpO2: 100% 100% 94% 98%  Weight:      Height:       Physical Exam Constitutional:      General: He is not in acute distress.    Appearance: He is not diaphoretic.  HENT:     Head: Normocephalic and atraumatic.     Nose: Nose normal.     Mouth/Throat:     Pharynx: No oropharyngeal exudate.  Eyes:     General: No scleral icterus.    Pupils: Pupils are equal, round, and reactive to light.  Cardiovascular:     Rate and Rhythm: Normal rate and regular rhythm.     Heart sounds: No murmur heard. Pulmonary:     Effort: Pulmonary effort is normal. No respiratory distress.     Breath sounds: No rales.  Chest:  Chest wall: No tenderness.  Abdominal:     General: There is no distension.     Palpations: Abdomen is soft.     Tenderness: There is no abdominal tenderness.  Musculoskeletal:        General: Normal range of motion.     Cervical back: Normal range of motion and neck supple.  Skin:    General: Skin is warm and dry.     Findings: No erythema.  Neurological:     Mental Status: He is alert and oriented to person, place, and time.     Cranial Nerves: No cranial nerve deficit.     Motor: No abnormal muscle tone.     Coordination: Coordination normal.  Psychiatric:        Mood and Affect: Affect normal.      Labs    Latest Ref Rng & Units 08/15/2021    2:19 AM 08/14/2021    9:41 AM 01/11/2020    1:26 AM  CBC  WBC 4.0 - 10.5 K/uL 8.5  10.2  7.1   Hemoglobin 13.0 - 17.0 g/dL 10.5  11.2  13.9   Hematocrit 39.0 - 52.0 % 30.3  33.0  42.5   Platelets 150 - 400 K/uL 194  206  294       Latest Ref Rng & Units 08/15/2021    2:04 PM 08/15/2021    8:47 AM 08/15/2021    2:19 AM  CMP  Glucose 70 - 99 mg/dL 104  105  78   BUN 6 - 20 mg/dL 58  61  68   Creatinine 0.61 - 1.24 mg/dL 4.21  4.48  5.12   Sodium 135 - 145 mmol/L 129  128  128   Potassium 3.5 - 5.1 mmol/L 3.5  3.4  3.3   Chloride 98 - 111 mmol/L 104  103  101    CO2 22 - 32 mmol/L 21  22  23    Calcium 8.9 - 10.3 mg/dL 11.1  11.0  11.1       RADIOGRAPHIC STUDIES: I have personally reviewed the radiological images as listed and agreed with the findings in the report. CT BONE MARROW BIOPSY & ASPIRATION  Result Date: 08/15/2021 INDICATION: Concern for multiple myeloma EXAM: CT BONE MARROW BIOPSY AND ASPIRATION MEDICATIONS: None. ANESTHESIA/SEDATION: Moderate (conscious) sedation was employed during this procedure. A total of Versed 1 mg and Fentanyl 100 mcg was administered intravenously. Moderate Sedation Time: 15 minutes. The patient's level of consciousness and vital signs were monitored continuously by radiology nursing throughout the procedure under my direct supervision. FLUOROSCOPY TIME:  N/a COMPLICATIONS: None immediate. PROCEDURE: Informed written consent was obtained from the patient after a thorough discussion of the procedural risks, benefits and alternatives. All questions were addressed. Maximal Sterile Barrier Technique was utilized including caps, mask, sterile gowns, sterile gloves, sterile drape, hand hygiene and skin antiseptic. A timeout was performed prior to the initiation of the procedure. The patient was placed prone on the CT exam table. Limited CT of the pelvis was performed for planning purposes. Skin entry site was marked, and the overlying skin was prepped and draped in the standard sterile fashion. Local analgesia was obtained with 1% lidocaine. Using CT guidance, an 11 gauge needle was advanced just deep to the cortex of the right posterior ilium. Subsequently, bone marrow aspiration and core biopsy were performed. Specimens were submitted to lab/pathology for handling. Hemostasis was achieved with manual pressure, and a clean dressing was placed. The patient tolerated the procedure  well without immediate complication. IMPRESSION: Successful CT-guided bone marrow aspiration core biopsy of the right posterior ilium. Electronically  Signed   By: Albin Felling M.D.   On: 08/15/2021 13:36   CT CHEST ABDOMEN PELVIS WO CONTRAST  Result Date: 08/14/2021 CLINICAL DATA:  Weakness, lethargy.  Evaluate for occult malignancy. EXAM: CT CHEST, ABDOMEN AND PELVIS WITHOUT CONTRAST TECHNIQUE: Multidetector CT imaging of the chest, abdomen and pelvis was performed following the standard protocol without IV contrast. RADIATION DOSE REDUCTION: This exam was performed according to the departmental dose-optimization program which includes automated exposure control, adjustment of the mA and/or kV according to patient size and/or use of iterative reconstruction technique. COMPARISON:  None Available. FINDINGS: CT CHEST FINDINGS Cardiovascular: Heart is normal size. Aorta is normal caliber. Mediastinum/Nodes: No mediastinal, hilar, or axillary adenopathy. Trachea and esophagus are unremarkable. Thyroid unremarkable. Lungs/Pleura: Lungs are clear. No focal airspace opacities or suspicious nodules. No effusions. Musculoskeletal: Chest wall soft tissues are unremarkable. Mild appearance with areas of lucency throughout the thoracic spine, sternum and ribs. This can be seen with metastases or myeloma. CT ABDOMEN PELVIS FINDINGS Hepatobiliary: No focal hepatic abnormality. Gallbladder unremarkable. Pancreas: No focal abnormality or ductal dilatation. Spleen: No focal abnormality.  Normal size. Adrenals/Urinary Tract: No adrenal abnormality. No focal renal abnormality. No stones or hydronephrosis. Urinary bladder is unremarkable. Stomach/Bowel: Sigmoid diverticulosis. No active diverticulitis. Stomach and small bowel decompressed, unremarkable. Normal appendix. Vascular/Lymphatic: No evidence of aneurysm or adenopathy. Reproductive: No visible focal abnormality. Other: No free fluid or free air. Musculoskeletal: Lucencies noted throughout the lumbar spine and bony pelvis, best seen in the left iliac bone. There is cortical destruction seen in the left iliac bone.  This could be seen with metastasis or myeloma. IMPRESSION: Numerous lucencies throughout the bony structures, most pronounced throughout the spine and in the left iliac bone. This could reflect metastases or myeloma. Otherwise no acute findings in the chest, abdomen or pelvis. Sigmoid diverticulosis. Electronically Signed   By: Rolm Baptise M.D.   On: 08/14/2021 18:24   US RENAL  Result Date: 08/14/2021 CLINICAL DATA:  Acute kidney injury EXAM: RENAL / URINARY TRACT ULTRASOUND COMPLETE COMPARISON:  None Available. FINDINGS: Right Kidney: Renal measurements: 12.4 x 6.2 x 7.2 cm = volume: 292 mL. Echogenicity may be mildly increased. No mass or hydronephrosis visualized. Left Kidney: Renal measurements: 12.2 x 6.6 x 5.8 cm = volume: 244 mL. Echogenicity may be mildly increased. No mass or hydronephrosis visualized. Bladder: Appears normal for degree of bladder distention. Other: None. IMPRESSION: No hydronephrosis. Possible mild increase in renal echogenicity could reflect medical renal disease. Electronically Signed   By: Macy Mis M.D.   On: 08/14/2021 12:17   DG Chest 2 View  Result Date: 08/14/2021 CLINICAL DATA:  Short of breath EXAM: CHEST - 2 VIEW COMPARISON:  01/11/2020 FINDINGS: The heart size and mediastinal contours are within normal limits. Both lungs are clear. The visualized skeletal structures are unremarkable. IMPRESSION: No active cardiopulmonary disease. Electronically Signed   By: Franchot Gallo M.D.   On: 08/14/2021 10:38     Assessment and plan-   #Anemia, acute kidney failure, hypercalcemia and multiple bone lesions on CT scan. Concerning for underlying bone marrow disorders.  Check multiple myeloma panel, LDH, light chain ratio, UPEP Pathology smear, flow cytometry, uric acid.  I will also check PSA.  Status post bone marrow biopsy.  Awaiting pathology results. No suspicious lymphadenopathy on current CT. Nephrology has also sent PTH, PTHrP ANA, ANCA profile, ACE.  IV fluid  hydration, avoid nephrotoxins, monitor creatinine level. Spoke with pathologist Dr. Ronnald Ramp.  CBC obtained this morning was sent to [facility].  We will repeat CBC to add on smear for further evaluation. 03/26/2021, MRI lumbar spine without contrast images reviewed.Marland Kitchen  Spoke with radiology, current lucent lesions were noted detected on previous MRI.    Hypercalcemia, patient received calcitonin, calcium level has improved, corrected calcium 12.1. PTH, PTHrP pending.  Recommendn Pamidronate if calcium progressively increases.  B12 deficiency, recommend patient to start on B12 supplement 1000 mcg daily.  Thank you for allowing me to participate in the care of this patient.   [name], MD, PhD Hematology Oncology 08/15/2021

## 2021-08-15 NOTE — Progress Notes (Signed)
PROGRESS NOTE    Tanner [name]  DHW:[id number] DOB: 03/31/1985 DOA: 08/14/2021 PCP: Pcp, No  Assessment & Plan:   Principal Problem:   AKI (acute kidney injury) ([place]) Active Problems:   Electrolyte disturbance   Hypercalcemia   Hypokalemia   Hyponatremia   Hypomagnesemia   Hyperphosphatemia   Microcytic anemia   Smoking   Alcohol use   Abnormal LFTs   Sciatica  Assessment and Plan: AKI: possibly secondary to MM. Renal US showed possible mild increase in renal echogenicity could reflect medical renal disease. Continue on IVFs. No NSAIDs. CT abd/pelvis shows numerous lucencies throughout bony structures could reflect metastasis or myeloma. UPEP ordered. S/p bone marrow biopsy 08/15/21. Onco consulted, Dr. Tasia Catchings.    Hypercalcemia: trending down slightly. S/p calcitonin x1. Continue on IVFs. Nephro consulted    Hypokalemia: will hold off repleating as Cr is elevated.    Hyponatremia: trending up w/ IVFs. Will continue to monitor. Nephro consulted    Hypomagnesemia: WNL today    Hyperphosphatemia: resolved   Microcytic anemia: etiology unclear, possibly secondary to MM. H&H are trending down. Iron is WNL    Smoking: received smoking cessation counseling. Nicotine patch to prevent w/drawl  Alcohol use: received alcohol cessation counseling. Continue on CIWA protocol    Transaminitis: likely due to alcohol abuse. Hepatitis panel is neg. HIV is neg    Sciatica: oxycodone prn. Holding prednisone       DVT prophylaxis: SCDs Code Status: full  Family Communication: discussed pt's care w/ pt's parents at bedside and answered their questions Disposition Plan:  likely d/c back home  Level of care: Telemetry Medical  Status is: Inpatient Remains inpatient appropriate because: severity of illness   Consultants:  Nephro Heme/onco   Procedures:  s/p bone marrow biopsy   Antimicrobials:   Subjective: Pt c/o back pain   Objective: Vitals:   08/14/21  1600 08/14/21 1641 08/14/21 2125 08/15/21 0544  BP:  (!) 148/83 125/78 121/79  Pulse:  88 83 75  Resp:  _0 Temp: (P) 97.8 F (36.6 C) 97.8 F (36.6 C) 97.6 F (36.4 C) 98.1 F (36.7 C)  TempSrc: (P) Oral Oral    SpO2: (P) 100% 100% 100% 99%  Weight:      Height:        Intake/Output Summary (Last 24 hours) at 08/15/2021 0807 Last data filed at 08/15/2021 0600 Gross per 24 hour  Intake 2102.16 ml  Output --  Net 2102.16 ml   Filed Weights   08/14/21 0858  Weight: 108 kg    Examination:  General exam: Appears calm and comfortable  Respiratory system: Clear to auscultation. Respiratory effort normal. Cardiovascular system: S1 & S2+. No rubs, gallops or clicks.  Gastrointestinal system: Abdomen is nondistended, soft and nontender. Normal bowel sounds heard. Central nervous system: Alert and oriented. Moves all extremities Extremities: Symmetric 5 x 5 power Psychiatry: Judgement and insight appear normal. Anxious mood and affect    Data Reviewed: I have personally reviewed following labs and imaging studies  CBC: Recent Labs  Lab 08/14/21 0941 08/15/21 0219  WBC 10.2 8.5  NEUTROABS 7.3  --   HGB 11.2* 10.5*  HCT 33.0* 30.3*  MCV 78.9* 78.1*  PLT 206 021   Basic Metabolic Panel: Recent Labs  Lab 08/14/21 0941 08/14/21 1247 08/14/21 2046 08/15/21 0219  NA 127* 126* 127* 128*  K 3.4* 3.4* 3.3* 3.3*  CL 95* 97* 100 101  CO2 _1 GLUCOSE 77  71 98 78  BUN 73* 69* 67* 68*  CREATININE 6.60* 6.41* 5.31* 5.12*  CALCIUM 14.3* 13.3* 11.8* 11.1*  MG 1.6*  --   --  1.8  PHOS >30.0*  --   --  3.9   GFR: Estimated Creatinine Clearance: 25.8 mL/min (A) (by C-G formula based on SCr of 5.12 mg/dL (H)). Liver Function Tests: Recent Labs  Lab 08/14/21 0941  AST 45*  ALT 57*  ALKPHOS 52  BILITOT 1.3*  PROT <3.0*  ALBUMIN 2.8*   No results for input(s): "LIPASE", "AMYLASE" in the last 168 hours. Recent Labs  Lab 08/14/21 1247  AMMONIA 34    Coagulation Profile: Recent Labs  Lab 08/14/21 1247  INR 1.1   Cardiac Enzymes: Recent Labs  Lab 08/14/21 0941  CKTOTAL 301   BNP (last 3 results) No results for input(s): "PROBNP" in the last 8760 hours. HbA1C: No results for input(s): "HGBA1C" in the last 72 hours. CBG: Recent Labs  Lab 08/11/21 1558  GLUCAP 78   Lipid Profile: No results for input(s): "CHOL", "HDL", "LDLCALC", "TRIG", "CHOLHDL", "LDLDIRECT" in the last 72 hours. Thyroid Function Tests: No results for input(s): "TSH", "T4TOTAL", "FREET4", "T3FREE", "THYROIDAB" in the last 72 hours. Anemia Panel: Recent Labs    08/14/21 0941 08/14/21 1247  VITAMINB12  --  146*  FOLATE  --  9.1  FERRITIN  --  922*  TIBC  --  311  IRON  --  121  RETICCTPCT 0.9  --    Sepsis Labs: No results for input(s): "PROCALCITON", "LATICACIDVEN" in the last 168 hours.  Recent Results (from the past 240 hour(s))  Resp Panel by RT-PCR (Flu A&B, Covid) Anterior Nasal Swab     Status: None   Collection Time: 08/11/21  2:39 PM   Specimen: Anterior Nasal Swab  Result Value Ref Range Status   SARS Coronavirus 2 by RT PCR NEGATIVE NEGATIVE Final    Comment: (NOTE) SARS-CoV-2 target nucleic acids are NOT DETECTED.  The SARS-CoV-2 RNA is generally detectable in upper respiratory specimens during the acute phase of infection. The lowest concentration of SARS-CoV-2 viral copies this assay can detect is 138 copies/mL. A negative result does not preclude SARS-Cov-2 infection and should not be used as the sole basis for treatment or other patient management decisions. A negative result may occur with  improper specimen collection/handling, submission of specimen other than nasopharyngeal swab, presence of viral mutation(s) within the areas targeted by this assay, and inadequate number of viral copies(<138 copies/mL). A negative result must be combined with clinical observations, patient history, and epidemiological information.  The expected result is Negative.  Fact Sheet for Patients:  EntrepreneurPulse.com.au  Fact Sheet for Healthcare Providers:  IncredibleEmployment.be  This test is no t yet approved or cleared by the [place] FDA and  has been authorized for detection and/or diagnosis of SARS-CoV-2 by FDA under an Emergency Use Authorization (EUA). This EUA will remain  in effect (meaning this test can be used) for the duration of the COVID-19 declaration under Section 564(b)(1) of the Act, 21 U.S.C.section 360bbb-3(b)(1), unless the authorization is terminated  or revoked sooner.       Influenza A by PCR NEGATIVE NEGATIVE Final   Influenza B by PCR NEGATIVE NEGATIVE Final    Comment: (NOTE) The Xpert Xpress SARS-CoV-2/FLU/RSV plus assay is intended as an aid in the diagnosis of influenza from Nasopharyngeal swab specimens and should not be used as a sole basis for treatment. Nasal washings and aspirates are unacceptable  for Xpert Xpress SARS-CoV-2/FLU/RSV testing.  Fact Sheet for Patients: EntrepreneurPulse.com.au  Fact Sheet for Healthcare Providers: IncredibleEmployment.be  This test is not yet approved or cleared by the [place] FDA and has been authorized for detection and/or diagnosis of SARS-CoV-2 by FDA under an Emergency Use Authorization (EUA). This EUA will remain in effect (meaning this test can be used) for the duration of the COVID-19 declaration under Section 564(b)(1) of the Act, 21 U.S.C. section 360bbb-3(b)(1), unless the authorization is terminated or revoked.  Performed at [facility], [address] [place], [place] [address]   Urine Culture     Status: Abnormal   Collection Time: 08/11/21  4:17 PM   Specimen: Urine, Random  Result Value Ref Range Status   Specimen Description   Final    URINE, RANDOM Performed at [facility], [address], [place], [place]  [address]    Special Requests   Final    NONE Performed at [facility], [address] [place], [place] [address]    Culture (A)  Final    <10,000 COLONIES/mL INSIGNIFICANT GROWTH Performed at [name], [place] [address] [place], [place] [address]    Report Status 08/12/2021 FINAL  Final         Radiology Studies: CT CHEST ABDOMEN PELVIS WO CONTRAST  Result Date: 08/14/2021 CLINICAL DATA:  Weakness, lethargy.  Evaluate for occult malignancy. EXAM: CT CHEST, ABDOMEN AND PELVIS WITHOUT CONTRAST TECHNIQUE: Multidetector CT imaging of the chest, abdomen and pelvis was performed following the standard protocol without IV contrast. RADIATION DOSE REDUCTION: This exam was performed according to the departmental dose-optimization program which includes automated exposure control, adjustment of the mA and/or kV according to patient size and/or use of iterative reconstruction technique. COMPARISON:  None Available. FINDINGS: CT CHEST FINDINGS Cardiovascular: Heart is normal size. Aorta is normal caliber. Mediastinum/Nodes: No mediastinal, hilar, or axillary adenopathy. Trachea and esophagus are unremarkable. Thyroid unremarkable. Lungs/Pleura: Lungs are clear. No focal airspace opacities or suspicious nodules. No effusions. Musculoskeletal: Chest wall soft tissues are unremarkable. Mild appearance with areas of lucency throughout the thoracic spine, sternum and ribs. This can be seen with metastases or myeloma. CT ABDOMEN PELVIS FINDINGS Hepatobiliary: No focal hepatic abnormality. Gallbladder unremarkable. Pancreas: No focal abnormality or ductal dilatation. Spleen: No focal abnormality.  Normal size. Adrenals/Urinary Tract: No adrenal abnormality. No focal renal abnormality. No stones or hydronephrosis. Urinary bladder is unremarkable. Stomach/Bowel: Sigmoid diverticulosis. No active diverticulitis. Stomach and small bowel decompressed, unremarkable. Normal appendix. Vascular/Lymphatic:  No evidence of aneurysm or adenopathy. Reproductive: No visible focal abnormality. Other: No free fluid or free air. Musculoskeletal: Lucencies noted throughout the lumbar spine and bony pelvis, best seen in the left iliac bone. There is cortical destruction seen in the left iliac bone. This could be seen with metastasis or myeloma. IMPRESSION: Numerous lucencies throughout the bony structures, most pronounced throughout the spine and in the left iliac bone. This could reflect metastases or myeloma. Otherwise no acute findings in the chest, abdomen or pelvis. Sigmoid diverticulosis. Electronically Signed   By: Rolm Baptise M.D.   On: 08/14/2021 18:24   US RENAL  Result Date: 08/14/2021 CLINICAL DATA:  Acute kidney injury EXAM: RENAL / URINARY TRACT ULTRASOUND COMPLETE COMPARISON:  None Available. FINDINGS: Right Kidney: Renal measurements: 12.4 x 6.2 x 7.2 cm = volume: 292 mL. Echogenicity may be mildly increased. No mass or hydronephrosis visualized. Left Kidney: Renal measurements: 12.2 x 6.6 x 5.8 cm = volume: 244  mL. Echogenicity may be mildly increased. No mass or hydronephrosis visualized. Bladder: Appears normal for degree of bladder distention. Other: None. IMPRESSION: No hydronephrosis. Possible mild increase in renal echogenicity could reflect medical renal disease. Electronically Signed   By: Macy Mis M.D.   On: 08/14/2021 12:17   DG Chest 2 View  Result Date: 08/14/2021 CLINICAL DATA:  Short of breath EXAM: CHEST - 2 VIEW COMPARISON:  01/11/2020 FINDINGS: The heart size and mediastinal contours are within normal limits. Both lungs are clear. The visualized skeletal structures are unremarkable. IMPRESSION: No active cardiopulmonary disease. Electronically Signed   By: Franchot Gallo M.D.   On: 08/14/2021 10:38        Scheduled Meds:  folic acid  1 mg Oral Daily   heparin  5,000 Units Subcutaneous Q8H   LORazepam  0-4 mg Intravenous Q6H   Followed by   Derrill Memo ON 08/16/2021]  LORazepam  0-4 mg Intravenous Q12H   multivitamin with minerals  1 tablet Oral Daily   nicotine  21 mg Transdermal Daily   senna-docusate  1 tablet Oral BID   thiamine  100 mg Oral Daily   Or   thiamine  100 mg Intravenous Daily   Continuous Infusions:  sodium chloride 75 mL/hr at 08/15/21 0520     LOS: 1 day    Time spent: 35 mins     Wyvonnia Dusky, MD [facility] Pager [phone]  If 7PM-7AM, please contact night-coverage [email or web address] 08/15/2021, 8:07 AM

## 2021-08-15 NOTE — Procedures (Signed)
Interventional Radiology Procedure Note  Date of Procedure: 08/15/2021  Procedure: CT BMBx  Findings:  1. CT BMBx right posterior ilium    Complications: No immediate complications noted.   Estimated Blood Loss: minimal  Follow-up and Recommendations: 1. Bedrest 1hour    Albin Felling, MD  Vascular & Interventional Radiology  08/15/2021 11:08 AM

## 2021-08-15 NOTE — H&P (Signed)
Interventional Radiology - Inpatient Consult H&P    Referring Provider (current admission): Wyvonnia Dusky, MD  Reason for Consult: BMBx    History of Present Illness  Tanner [name] is a 36 y.o. male with concern for possible multiple myeloma seen in consultation for BMBx.     Additional Past Medical History Past Medical History:  Diagnosis Date   Alcohol use    Sciatica    Smoking     Surgical History  Past Surgical History:  Procedure Laterality Date   INGUINAL HERNIA REPAIR       Medications  I have reviewed the current medication list. Refer to chart for details. Current Outpatient Medications  Medication Instructions   cetirizine (ZYRTEC) 10 mg, Oral, Daily   diazepam (VALIUM) 2 mg, Oral, Every 8 hours PRN   fluticasone (FLONASE) 50 MCG/ACT nasal spray 1 spray, Each Nare, 2 times daily   ibuprofen (ADVIL) 800 mg, Oral, Every 8 hours PRN   meloxicam (MOBIC) 15 mg, Oral, Daily   predniSONE (DELTASONE) 50 mg, Oral, Daily with breakfast     I have reviewed prior sedation medication usage: Yes    Allergies No Known Allergies Does patient have contrast allergy: No     Physical Exam Current Vitals Temp: 97.7 F (36.5 C) (Temp Source: Oral)  Pulse Rate: 85  Resp: 18  BP: 113/69  SpO2: 100 %  Height: 6' (182.9 cm)  Weight: 108 kg  Body mass index is 32.29 kg/m.  General: Alert and answers questions appropriately. No apparent distress. HEENT: Normocephalic, atraumatic. Conjunctivae normal without scleral icterus. Mallampati score: I (soft palate, uvula, fauces, and tonsillar pillars visible) Cardiac: Regular rate. No dependent edema. Pulmonary: Normal work of breathing. On room air. Abdominal: Soft without distension. Extremities: Normally-formed, well perfused.    Pertinent Lab Results Labs: CBC: WBC/Hgb/Hct/Plts:  8.5/10.5/30.3/194 (08/11 0219)  BMP: BUN/Cr/glu/ALT/AST/amyl/lip:  61/4.48/--/--/--/--/-- (08/11 0847) Coagulation:  PT/INR/PTT:  --/1.1/-- (08/10 1247)  CBC Trends: Recent Labs    08/14/21 0941 08/15/21 0219  WBC 10.2 8.5  HGB 11.2* 10.5*  HCT 33.0* 30.3*  PLT 206 194     Creatinine Trend: Recent Labs    08/14/21 2046 08/15/21 0219 08/15/21 0847  CREATININE 5.31* 5.12* 4.48*     Relevant and/or Recent Imaging: [place] is a 36 y.o. male seen in consultation for BMBx.  Appropriate for procedure.    Procedure Checklist:  Consent obtained: Risks of the procedure as well as the alternatives and risks of each were explained to the patient and/or caregiver.  Consent for the procedure was obtained and is signed in the bedside chart Consent obtained from: The patient Patient is appropriate candidate for sedation Yes ASA Classification: ASA 2 - Patient with mild systemic disease with no functional limitations NPO status: yes   Code status:   Code Status: Full Code Pre-procedural prep necessary: none      I spent a total of 20 Minutes   in face-to-face in clinical consultation, greater than 50% of which was spent on medical decision-making and counseling/coordinating care for BMBx.     Albin Felling, MD  Vascular and Interventional Radiology 08/15/2021 10:34 AM

## 2021-08-16 DIAGNOSIS — D509 Iron deficiency anemia, unspecified: Secondary | ICD-10-CM | POA: Diagnosis not present

## 2021-08-16 DIAGNOSIS — N179 Acute kidney failure, unspecified: Secondary | ICD-10-CM | POA: Diagnosis not present

## 2021-08-16 LAB — BASIC METABOLIC PANEL
Anion gap: 3 — ABNORMAL LOW (ref 5–15)
Anion gap: 3 — ABNORMAL LOW (ref 5–15)
BUN: 53 mg/dL — ABNORMAL HIGH (ref 6–20)
BUN: 46 mg/dL — ABNORMAL HIGH (ref 6–20)
CO2: 22 mmol/L (ref 22–32)
CO2: 22 mmol/L (ref 22–32)
Calcium: 11.4 mg/dL — ABNORMAL HIGH (ref 8.9–10.3)
Calcium: 12.2 mg/dL — ABNORMAL HIGH (ref 8.9–10.3)
Chloride: 105 mmol/L (ref 98–111)
Chloride: 104 mmol/L (ref 98–111)
Creatinine, Ser: 3.58 mg/dL — ABNORMAL HIGH (ref 0.61–1.24)
Creatinine, Ser: 3.27 mg/dL — ABNORMAL HIGH (ref 0.61–1.24)
GFR, Estimated: 22 mL/min — ABNORMAL LOW (ref >60)
GFR, Estimated: 24 mL/min — ABNORMAL LOW (ref >60)
Glucose, Bld: 87 mg/dL (ref 70–99)
Glucose, Bld: 105 mg/dL — ABNORMAL HIGH (ref 70–99)
Potassium: 3.7 mmol/L (ref 3.5–5.1)
Potassium: 3.9 mmol/L (ref 3.5–5.1)
Sodium: 130 mmol/L — ABNORMAL LOW (ref 135–145)
Sodium: 129 mmol/L — ABNORMAL LOW (ref 135–145)

## 2021-08-16 LAB — CBC
HCT: 30.1 % — ABNORMAL LOW (ref 39.0–52.0)
Hemoglobin: 10 g/dL — ABNORMAL LOW (ref 13.0–17.0)
MCH: 26.4 pg (ref 26.0–34.0)
MCHC: 33.2 g/dL (ref 30.0–36.0)
MCV: 79.4 fL — ABNORMAL LOW (ref 80.0–100.0)
Platelets: 198 10*3/uL (ref 150–400)
RBC: 3.79 MIL/uL — ABNORMAL LOW (ref 4.22–5.81)
RDW: 14.4 % (ref 11.5–15.5)
WBC: 7.3 10*3/uL (ref 4.0–10.5)
nRBC: 0 % (ref 0.0–0.2)

## 2021-08-16 LAB — HEPATIC FUNCTION PANEL
ALT: 42 U/L (ref 0–44)
AST: 34 U/L (ref 15–41)
Albumin: 2.2 g/dL — ABNORMAL LOW (ref 3.5–5.0)
Alkaline Phosphatase: 46 U/L (ref 38–126)
Bilirubin, Direct: <0.1 mg/dL (ref 0.0–0.2)
Total Bilirubin: 0.8 mg/dL (ref 0.3–1.2)
Total Protein: 11.2 g/dL — ABNORMAL HIGH (ref 6.5–8.1)

## 2021-08-16 LAB — ANGIOTENSIN CONVERTING ENZYME: Angiotensin-Converting Enzyme: 12 U/L — ABNORMAL LOW (ref 14–82)

## 2021-08-16 LAB — PSA: Prostatic Specific Antigen: 0.22 ng/mL (ref 0.00–4.00)

## 2021-08-16 LAB — GLOMERULAR BASEMENT MEMBRANE ANTIBODIES: GBM Ab: <0.2 units (ref 0.0–0.9)

## 2021-08-16 LAB — ANA W/REFLEX IF POSITIVE: Anti Nuclear Antibody (ANA): NEGATIVE

## 2021-08-16 LAB — PARATHYROID HORMONE, INTACT (NO CA): PTH: 8 pg/mL — ABNORMAL LOW (ref 15–65)

## 2021-08-16 MED ORDER — SODIUM CHLORIDE 0.9 % IV SOLN
30.0000 mg | Freq: Once | INTRAVENOUS | Status: AC
  Administered 2021-08-16: 30 mg via INTRAVENOUS
  Filled 2021-08-16: qty 10

## 2021-08-16 NOTE — Progress Notes (Signed)
[facility]  PROGRESS NOTE   Subjective:   Patient seen at bedside.  Denies any chest pain, shortness of breath or orthopnea. He ate better this morning. Family is in attendance.  Objective:  Vital signs: Blood pressure 117/67, pulse 82, temperature 98.3 F (36.8 C), resp. rate 18, height 6' (1.829 m), weight 108 kg, SpO2 100 %.  Intake/Output Summary (Last 24 hours) at 08/16/2021 1121 Last data filed at 08/16/2021 0930 Gross per 24 hour  Intake 2627.58 ml  Output --  Net 2627.58 ml   Filed Weights   08/14/21 0858  Weight: 108 kg     Physical Exam: General:  No acute distress  Head:  Normocephalic, atraumatic. Moist oral mucosal membranes  Eyes:  Anicteric  Neck:  Supple  Lungs:   Clear to auscultation, normal effort  Heart:  S1S2 no rubs  Abdomen:   Soft, nontender, bowel sounds present  Extremities:  peripheral edema.  Neurologic:  Awake, alert, following commands  Skin:  No lesions  Access:     Basic Metabolic Panel: Recent Labs  Lab 08/14/21 0941 08/14/21 1247 08/15/21 0219 08/15/21 0847 08/15/21 1404 08/15/21 2023 08/16/21 0216 08/16/21 0854  NA 127*   < > 128* 128* 129* 127* 130* 129*  K 3.4*   < > 3.3* 3.4* 3.5 3.5 3.7 3.9  CL 95*   < > 101 103 104 102 105 104  CO2 23   < > 23 22 21* 23 22 22   GLUCOSE 77   < > 78 105* 104* 113* 87 105*  BUN 73*   < > 68* 61* 58* 56* 53* 46*  CREATININE 6.60*   < > 5.12* 4.48* 4.21* 3.79* 3.58* 3.27*  CALCIUM 14.3*   < > 11.1* 11.0* 11.1* 11.2* 11.4* 12.2*  MG 1.6*  --  1.8  --   --   --   --   --   PHOS >30.0*  --  3.9  --   --   --   --   --    < > = values in this interval not displayed.    CBC: Recent Labs  Lab 08/14/21 0941 08/15/21 0219 08/15/21 1552 08/16/21 0216  WBC 10.2 8.5 7.8 7.3  NEUTROABS 7.3  --  4.2  --   HGB 11.2* 10.5* 10.1* 10.0*  HCT 33.0* 30.3* 29.6* 30.1*  MCV 78.9* 78.1* 78.3* 79.4*  PLT 206 194 199 198     Urinalysis: Recent Labs    08/14/21 1620  COLORURINE  STRAW*  LABSPEC 1.006  PHURINE 5.0  GLUCOSEU NEGATIVE  HGBUR MODERATE*  BILIRUBINUR NEGATIVE  KETONESUR NEGATIVE  PROTEINUR NEGATIVE  NITRITE NEGATIVE  LEUKOCYTESUR NEGATIVE      Imaging: CT BONE MARROW BIOPSY & ASPIRATION  Result Date: 08/15/2021 INDICATION: Concern for multiple myeloma EXAM: CT BONE MARROW BIOPSY AND ASPIRATION MEDICATIONS: None. ANESTHESIA/SEDATION: Moderate (conscious) sedation was employed during this procedure. A total of Versed 1 mg and Fentanyl 100 mcg was administered intravenously. Moderate Sedation Time: 15 minutes. The patient's level of consciousness and vital signs were monitored continuously by radiology nursing throughout the procedure under my direct supervision. FLUOROSCOPY TIME:  N/a COMPLICATIONS: None immediate. PROCEDURE: Informed written consent was obtained from the patient after a thorough discussion of the procedural risks, benefits and alternatives. All questions were addressed. Maximal Sterile Barrier Technique was utilized including caps, mask, sterile gowns, sterile gloves, sterile drape, hand hygiene and skin antiseptic. A timeout was performed prior to the initiation of the procedure.  The patient was placed prone on the CT exam table. Limited CT of the pelvis was performed for planning purposes. Skin entry site was marked, and the overlying skin was prepped and draped in the standard sterile fashion. Local analgesia was obtained with 1% lidocaine. Using CT guidance, an 11 gauge needle was advanced just deep to the cortex of the right posterior ilium. Subsequently, bone marrow aspiration and core biopsy were performed. Specimens were submitted to lab/pathology for handling. Hemostasis was achieved with manual pressure, and a clean dressing was placed. The patient tolerated the procedure well without immediate complication. IMPRESSION: Successful CT-guided bone marrow aspiration core biopsy of the right posterior ilium. Electronically Signed   By: Albin Felling M.D.   On: 08/15/2021 13:36   CT CHEST ABDOMEN PELVIS WO CONTRAST  Result Date: 08/14/2021 CLINICAL DATA:  Weakness, lethargy.  Evaluate for occult malignancy. EXAM: CT CHEST, ABDOMEN AND PELVIS WITHOUT CONTRAST TECHNIQUE: Multidetector CT imaging of the chest, abdomen and pelvis was performed following the standard protocol without IV contrast. RADIATION DOSE REDUCTION: This exam was performed according to the departmental dose-optimization program which includes automated exposure control, adjustment of the mA and/or kV according to patient size and/or use of iterative reconstruction technique. COMPARISON:  None Available. FINDINGS: CT CHEST FINDINGS Cardiovascular: Heart is normal size. Aorta is normal caliber. Mediastinum/Nodes: No mediastinal, hilar, or axillary adenopathy. Trachea and esophagus are unremarkable. Thyroid unremarkable. Lungs/Pleura: Lungs are clear. No focal airspace opacities or suspicious nodules. No effusions. Musculoskeletal: Chest wall soft tissues are unremarkable. Mild appearance with areas of lucency throughout the thoracic spine, sternum and ribs. This can be seen with metastases or myeloma. CT ABDOMEN PELVIS FINDINGS Hepatobiliary: No focal hepatic abnormality. Gallbladder unremarkable. Pancreas: No focal abnormality or ductal dilatation. Spleen: No focal abnormality.  Normal size. Adrenals/Urinary Tract: No adrenal abnormality. No focal renal abnormality. No stones or hydronephrosis. Urinary bladder is unremarkable. Stomach/Bowel: Sigmoid diverticulosis. No active diverticulitis. Stomach and small bowel decompressed, unremarkable. Normal appendix. Vascular/Lymphatic: No evidence of aneurysm or adenopathy. Reproductive: No visible focal abnormality. Other: No free fluid or free air. Musculoskeletal: Lucencies noted throughout the lumbar spine and bony pelvis, best seen in the left iliac bone. There is cortical destruction seen in the left iliac bone. This could be seen with  metastasis or myeloma. IMPRESSION: Numerous lucencies throughout the bony structures, most pronounced throughout the spine and in the left iliac bone. This could reflect metastases or myeloma. Otherwise no acute findings in the chest, abdomen or pelvis. Sigmoid diverticulosis. Electronically Signed   By: Rolm Baptise M.D.   On: 08/14/2021 18:24   US RENAL  Result Date: 08/14/2021 CLINICAL DATA:  Acute kidney injury EXAM: RENAL / URINARY TRACT ULTRASOUND COMPLETE COMPARISON:  None Available. FINDINGS: Right Kidney: Renal measurements: 12.4 x 6.2 x 7.2 cm = volume: 292 mL. Echogenicity may be mildly increased. No mass or hydronephrosis visualized. Left Kidney: Renal measurements: 12.2 x 6.6 x 5.8 cm = volume: 244 mL. Echogenicity may be mildly increased. No mass or hydronephrosis visualized. Bladder: Appears normal for degree of bladder distention. Other: None. IMPRESSION: No hydronephrosis. Possible mild increase in renal echogenicity could reflect medical renal disease. Electronically Signed   By: Macy Mis M.D.   On: 08/14/2021 12:17     Medications:    sodium chloride 75 mL/hr at 08/16/21 0930   pamidronate      vitamin B-12  1,000 mcg Oral Daily   folic acid  1 mg Oral Daily   heparin  5,000 Units Subcutaneous Q8H   LORazepam  0-4 mg Intravenous Q6H   Followed by   LORazepam  0-4 mg Intravenous Q12H   multivitamin with minerals  1 tablet Oral Daily   nicotine  21 mg Transdermal Daily   senna-docusate  1 tablet Oral BID   thiamine  100 mg Oral Daily   Or   thiamine  100 mg Intravenous Daily    Assessment/ Plan:     Principal Problem:   AKI (acute kidney injury) ([place]) Active Problems:   Electrolyte disturbance   Hypercalcemia   Hypokalemia   Hyponatremia   Microcytic anemia   Smoking   Alcohol use   Sciatica   Abnormal LFTs   Hypomagnesemia   Hyperphosphatemia   Fatigue   B12 deficiency   Bone lesion  Mr. Tanner [name] is a 36 y.o.  male with past  medical history of alcohol and tobacco use, anxiety, and sciatica, who was admitted to [facility] on 08/14/2021 for Hypercalcemia [E83.52] Electrolyte disturbance [E87.8] Abnormal laboratory test result [R89.9] AKI (acute kidney injury) ([place]) [N17.9] Fatigue, unspecified type [R53.83]  #1: Acute kidney injury: Patient with acute kidney injury most likely secondary to metastatic myeloma.  Serologies are still pending.  Hematology oncology note appreciated.  We will continue IV fluid resuscitation with isotonic saline.  Monitor renal function closely.  #2: Hypercalcemia: Hypercalcemia most likely secondary to myeloma.  Patient received calcitonin.  We can give a dose of pamidronate 30 mg over 4 hours today.  #3: Hyponatremia: We will continue with isotonic saline.  Sodium has improved significantly.  Spoke to patient's mother at bedside in detail.  We will continue to follow.   LOS: 2 Lyla Son, MD [facility] 8/12/202311:21 AM

## 2021-08-16 NOTE — Progress Notes (Signed)
PROGRESS NOTE    Tanner [name]  BWG:[id number] DOB: [birth year] DOA: 08/14/2021 PCP: Pcp, No  Assessment & Plan:   Principal Problem:   AKI (acute kidney injury) ([place]) Active Problems:   Electrolyte disturbance   Hypercalcemia   Hypokalemia   Hyponatremia   Hypomagnesemia   Hyperphosphatemia   Microcytic anemia   Smoking   Alcohol use   Abnormal LFTs   Sciatica   Fatigue   B12 deficiency   Bone lesion  Assessment and Plan: AKI: possibly secondary to multiple myeloma. Continue on IVFs.  No NSAIDs. CT abd/pelvis shows numerous lucencies throughout bony structures could reflect metastasis or myeloma. UPEP prending. S/p bone marrow biopsy 08/15/21 and waiting on results. Onco following and recs apprec.    Hypercalcemia: likely secondary to possibly MM. Labile. S/p calcitonin x 1. Continue on IVFs. Will give pamidronate x 1 today. Nephro following and recs apprec.   Hypokalemia: WNL today    Hyponatremia: labile. Continue on IVFs    Hypomagnesemia: WNL today    Hyperphosphatemia: resolved   Microcytic anemia: etiology unclear, possibly secondary to MM. H&H are stable. Iron is WNL    Smoking: received smoking cessation counseling. Nicotine patch to prevent w/drawl   Alcohol use: received alcohol cessation counseling. Continue on CIWA protocol    Transaminitis: resolved    Sciatica: oxycodone prn. Holding prednisone       DVT prophylaxis: SCDs Code Status: full  Family Communication: discussed pt's care w/ pt's mother at bedside and answered her questions Disposition Plan:  likely d/c back home  Level of care: Med-Surg  Status is: Inpatient Remains inpatient appropriate because: severity of illness   Consultants:  Nephro Heme/onco   Procedures:  s/p bone marrow biopsy   Antimicrobials:   Subjective: Pt c/o fatigue   Objective: Vitals:   08/15/21 1139 08/15/21 1542 08/15/21 2132 08/16/21 0537  BP: 122/76 106/60 119/64 124/79  Pulse:  83 81 84 76  Resp: 18 16 18 16   Temp: 97.9 F (36.6 C) 97.9 F (36.6 C) 98.2 F (36.8 C) 98.1 F (36.7 C)  TempSrc:      SpO2: 98% 100% 100% 100%  Weight:      Height:        Intake/Output Summary (Last 24 hours) at 08/16/2021 0735 Last data filed at 08/15/2021 1900 Gross per 24 hour  Intake 1258.66 ml  Output --  Net 1258.66 ml   Filed Weights   08/14/21 0858  Weight: 108 kg    Examination:  General exam: Appears calm & comfortable   Respiratory system: clear breath sounds b/l  Cardiovascular system: S1/S2+. No rubs or clicks   Gastrointestinal system: Abd is soft, NT, obese & normal bowel sounds  Central nervous system: Alert and oriented. Moves all extremities Psychiatry: judgement and insight appears normal. Appropriate mood and affect     Data Reviewed: I have personally reviewed following labs and imaging studies  CBC: Recent Labs  Lab 08/14/21 0941 08/15/21 0219 08/15/21 1552 08/16/21 0216  WBC 10.2 8.5 7.8 7.3  NEUTROABS 7.3  --  4.2  --   HGB 11.2* 10.5* 10.1* 10.0*  HCT 33.0* 30.3* 29.6* 30.1*  MCV 78.9* 78.1* 78.3* 79.4*  PLT 206 194 199 177   Basic Metabolic Panel: Recent Labs  Lab 08/14/21 0941 08/14/21 1247 08/15/21 0219 08/15/21 0847 08/15/21 1404 08/15/21 2023 08/16/21 0216  NA 127*   < > 128* 128* 129* 127* 130*  K 3.4*   < > 3.3* 3.4* 3.5 3.5  3.7  CL 95*   < > 101 103 104 102 105  CO2 23   < > 23 22 21* 23 22  GLUCOSE 77   < > 78 105* 104* 113* 87  BUN 73*   < > 68* 61* 58* 56* 53*  CREATININE 6.60*   < > 5.12* 4.48* 4.21* 3.79* 3.58*  CALCIUM 14.3*   < > 11.1* 11.0* 11.1* 11.2* 11.4*  MG 1.6*  --  1.8  --   --   --   --   PHOS >30.0*  --  3.9  --   --   --   --    < > = values in this interval not displayed.   GFR: Estimated Creatinine Clearance: 36.9 mL/min (A) (by C-G formula based on SCr of 3.58 mg/dL (H)). Liver Function Tests: Recent Labs  Lab 08/14/21 0941 08/16/21 0216  AST 45* 34  ALT 57* 42  ALKPHOS 52 46   BILITOT 1.3* 0.8  PROT <3.0* 11.2*  ALBUMIN 2.8* 2.2*   No results for input(s): "LIPASE", "AMYLASE" in the last 168 hours. Recent Labs  Lab 08/14/21 1247  AMMONIA 34   Coagulation Profile: Recent Labs  Lab 08/14/21 1247  INR 1.1   Cardiac Enzymes: Recent Labs  Lab 08/14/21 0941  CKTOTAL 301   BNP (last 3 results) No results for input(s): "PROBNP" in the last 8760 hours. HbA1C: No results for input(s): "HGBA1C" in the last 72 hours. CBG: Recent Labs  Lab 08/11/21 1558  GLUCAP 78   Lipid Profile: No results for input(s): "CHOL", "HDL", "LDLCALC", "TRIG", "CHOLHDL", "LDLDIRECT" in the last 72 hours. Thyroid Function Tests: No results for input(s): "TSH", "T4TOTAL", "FREET4", "T3FREE", "THYROIDAB" in the last 72 hours. Anemia Panel: Recent Labs    08/14/21 0941 08/14/21 1247  VITAMINB12  --  146*  FOLATE  --  9.1  FERRITIN  --  922*  TIBC  --  311  IRON  --  121  RETICCTPCT 0.9  --    Sepsis Labs: No results for input(s): "PROCALCITON", "LATICACIDVEN" in the last 168 hours.  Recent Results (from the past 240 hour(s))  Resp Panel by RT-PCR (Flu A&B, Covid) Anterior Nasal Swab     Status: None   Collection Time: 08/11/21  2:39 PM   Specimen: Anterior Nasal Swab  Result Value Ref Range Status   SARS Coronavirus 2 by RT PCR NEGATIVE NEGATIVE Final    Comment: (NOTE) SARS-CoV-2 target nucleic acids are NOT DETECTED.  The SARS-CoV-2 RNA is generally detectable in upper respiratory specimens during the acute phase of infection. The lowest concentration of SARS-CoV-2 viral copies this assay can detect is 138 copies/mL. A negative result does not preclude SARS-Cov-2 infection and should not be used as the sole basis for treatment or other patient management decisions. A negative result may occur with  improper specimen collection/handling, submission of specimen other than nasopharyngeal swab, presence of viral mutation(s) within the areas targeted by this  assay, and inadequate number of viral copies(<138 copies/mL). A negative result must be combined with clinical observations, patient history, and epidemiological information. The expected result is Negative.  Fact Sheet for Patients:  EntrepreneurPulse.com.au  Fact Sheet for Healthcare Providers:  IncredibleEmployment.be  This test is no t yet approved or cleared by the [place] FDA and  has been authorized for detection and/or diagnosis of SARS-CoV-2 by FDA under an Emergency Use Authorization (EUA). This EUA will remain  in effect (meaning this test can be used)  for the duration of the COVID-19 declaration under Section 564(b)(1) of the Act, 21 U.S.C.section 360bbb-3(b)(1), unless the authorization is terminated  or revoked sooner.       Influenza A by PCR NEGATIVE NEGATIVE Final   Influenza B by PCR NEGATIVE NEGATIVE Final    Comment: (NOTE) The Xpert Xpress SARS-CoV-2/FLU/RSV plus assay is intended as an aid in the diagnosis of influenza from Nasopharyngeal swab specimens and should not be used as a sole basis for treatment. Nasal washings and aspirates are unacceptable for Xpert Xpress SARS-CoV-2/FLU/RSV testing.  Fact Sheet for Patients: EntrepreneurPulse.com.au  Fact Sheet for Healthcare Providers: IncredibleEmployment.be  This test is not yet approved or cleared by the [place] FDA and has been authorized for detection and/or diagnosis of SARS-CoV-2 by FDA under an Emergency Use Authorization (EUA). This EUA will remain in effect (meaning this test can be used) for the duration of the COVID-19 declaration under Section 564(b)(1) of the Act, 21 U.S.C. section 360bbb-3(b)(1), unless the authorization is terminated or revoked.  Performed at [facility], [address] [place], [place] [address]   Urine Culture     Status: Abnormal   Collection Time: 08/11/21  4:17 PM    Specimen: Urine, Random  Result Value Ref Range Status   Specimen Description   Final    URINE, RANDOM Performed at [facility], [address] [place], [place] [address]    Special Requests   Final    NONE Performed at [facility], [address] [place], [place] [address]    Culture (A)  Final    <10,000 COLONIES/mL INSIGNIFICANT GROWTH Performed at [facility], [place] [address] [place], [place] [address]    Report Status 08/12/2021 FINAL  Final         Radiology Studies: CT BONE MARROW BIOPSY & ASPIRATION  Result Date: 08/15/2021 INDICATION: Concern for multiple myeloma EXAM: CT BONE MARROW BIOPSY AND ASPIRATION MEDICATIONS: None. ANESTHESIA/SEDATION: Moderate (conscious) sedation was employed during this procedure. A total of Versed 1 mg and Fentanyl 100 mcg was administered intravenously. Moderate Sedation Time: 15 minutes. The patient's level of consciousness and vital signs were monitored continuously by radiology nursing throughout the procedure under my direct supervision. FLUOROSCOPY TIME:  N/a COMPLICATIONS: None immediate. PROCEDURE: Informed written consent was obtained from the patient after a thorough discussion of the procedural risks, benefits and alternatives. All questions were addressed. Maximal Sterile Barrier Technique was utilized including caps, mask, sterile gowns, sterile gloves, sterile drape, hand hygiene and skin antiseptic. A timeout was performed prior to the initiation of the procedure. The patient was placed prone on the CT exam table. Limited CT of the pelvis was performed for planning purposes. Skin entry site was marked, and the overlying skin was prepped and draped in the standard sterile fashion. Local analgesia was obtained with 1% lidocaine. Using CT guidance, an 11 gauge needle was advanced just deep to the cortex of the right posterior ilium. Subsequently, bone marrow aspiration and core biopsy were performed. Specimens  were submitted to lab/pathology for handling. Hemostasis was achieved with manual pressure, and a clean dressing was placed. The patient tolerated the procedure well without immediate complication. IMPRESSION: Successful CT-guided bone marrow aspiration core biopsy of the right posterior ilium. Electronically Signed   By: Albin Felling M.D.   On: 08/15/2021 13:36   CT CHEST ABDOMEN PELVIS WO CONTRAST  Result Date: 08/14/2021 CLINICAL DATA:  Weakness, lethargy.  Evaluate for occult malignancy. EXAM: CT CHEST,  ABDOMEN AND PELVIS WITHOUT CONTRAST TECHNIQUE: Multidetector CT imaging of the chest, abdomen and pelvis was performed following the standard protocol without IV contrast. RADIATION DOSE REDUCTION: This exam was performed according to the departmental dose-optimization program which includes automated exposure control, adjustment of the mA and/or kV according to patient size and/or use of iterative reconstruction technique. COMPARISON:  None Available. FINDINGS: CT CHEST FINDINGS Cardiovascular: Heart is normal size. Aorta is normal caliber. Mediastinum/Nodes: No mediastinal, hilar, or axillary adenopathy. Trachea and esophagus are unremarkable. Thyroid unremarkable. Lungs/Pleura: Lungs are clear. No focal airspace opacities or suspicious nodules. No effusions. Musculoskeletal: Chest wall soft tissues are unremarkable. Mild appearance with areas of lucency throughout the thoracic spine, sternum and ribs. This can be seen with metastases or myeloma. CT ABDOMEN PELVIS FINDINGS Hepatobiliary: No focal hepatic abnormality. Gallbladder unremarkable. Pancreas: No focal abnormality or ductal dilatation. Spleen: No focal abnormality.  Normal size. Adrenals/Urinary Tract: No adrenal abnormality. No focal renal abnormality. No stones or hydronephrosis. Urinary bladder is unremarkable. Stomach/Bowel: Sigmoid diverticulosis. No active diverticulitis. Stomach and small bowel decompressed, unremarkable. Normal appendix.  Vascular/Lymphatic: No evidence of aneurysm or adenopathy. Reproductive: No visible focal abnormality. Other: No free fluid or free air. Musculoskeletal: Lucencies noted throughout the lumbar spine and bony pelvis, best seen in the left iliac bone. There is cortical destruction seen in the left iliac bone. This could be seen with metastasis or myeloma. IMPRESSION: Numerous lucencies throughout the bony structures, most pronounced throughout the spine and in the left iliac bone. This could reflect metastases or myeloma. Otherwise no acute findings in the chest, abdomen or pelvis. Sigmoid diverticulosis. Electronically Signed   By: Rolm Baptise M.D.   On: 08/14/2021 18:24   US RENAL  Result Date: 08/14/2021 CLINICAL DATA:  Acute kidney injury EXAM: RENAL / URINARY TRACT ULTRASOUND COMPLETE COMPARISON:  None Available. FINDINGS: Right Kidney: Renal measurements: 12.4 x 6.2 x 7.2 cm = volume: 292 mL. Echogenicity may be mildly increased. No mass or hydronephrosis visualized. Left Kidney: Renal measurements: 12.2 x 6.6 x 5.8 cm = volume: 244 mL. Echogenicity may be mildly increased. No mass or hydronephrosis visualized. Bladder: Appears normal for degree of bladder distention. Other: None. IMPRESSION: No hydronephrosis. Possible mild increase in renal echogenicity could reflect medical renal disease. Electronically Signed   By: Macy Mis M.D.   On: 08/14/2021 12:17   DG Chest 2 View  Result Date: 08/14/2021 CLINICAL DATA:  Short of breath EXAM: CHEST - 2 VIEW COMPARISON:  01/11/2020 FINDINGS: The heart size and mediastinal contours are within normal limits. Both lungs are clear. The visualized skeletal structures are unremarkable. IMPRESSION: No active cardiopulmonary disease. Electronically Signed   By: Franchot Gallo M.D.   On: 08/14/2021 10:38        Scheduled Meds:  vitamin B-12  1,000 mcg Oral Daily   folic acid  1 mg Oral Daily   heparin  5,000 Units Subcutaneous Q8H   LORazepam  0-4 mg  Intravenous Q6H   Followed by   LORazepam  0-4 mg Intravenous Q12H   multivitamin with minerals  1 tablet Oral Daily   nicotine  21 mg Transdermal Daily   senna-docusate  1 tablet Oral BID   thiamine  100 mg Oral Daily   Or   thiamine  100 mg Intravenous Daily   Continuous Infusions:  sodium chloride 75 mL/hr at 08/16/21 0451     LOS: 2 days    Time spent: 35 mins     Wyvonnia Dusky,  MD [facility] Pager [phone]  If 7PM-7AM, please contact night-coverage [email or web address] 08/16/2021, 7:35 AM

## 2021-08-17 DIAGNOSIS — E790 Hyperuricemia without signs of inflammatory arthritis and tophaceous disease: Secondary | ICD-10-CM | POA: Insufficient documentation

## 2021-08-17 DIAGNOSIS — M899 Disorder of bone, unspecified: Secondary | ICD-10-CM | POA: Diagnosis not present

## 2021-08-17 DIAGNOSIS — N179 Acute kidney failure, unspecified: Secondary | ICD-10-CM | POA: Diagnosis not present

## 2021-08-17 LAB — BASIC METABOLIC PANEL
Anion gap: 0 — ABNORMAL LOW (ref 5–15)
BUN: 37 mg/dL — ABNORMAL HIGH (ref 6–20)
CO2: 26 mmol/L (ref 22–32)
Calcium: 11.9 mg/dL — ABNORMAL HIGH (ref 8.9–10.3)
Chloride: 108 mmol/L (ref 98–111)
Creatinine, Ser: 3.03 mg/dL — ABNORMAL HIGH (ref 0.61–1.24)
GFR, Estimated: 27 mL/min — ABNORMAL LOW (ref >60)
Glucose, Bld: 101 mg/dL — ABNORMAL HIGH (ref 70–99)
Potassium: 3.8 mmol/L (ref 3.5–5.1)
Sodium: 134 mmol/L — ABNORMAL LOW (ref 135–145)

## 2021-08-17 LAB — CBC
HCT: 27.9 % — ABNORMAL LOW (ref 39.0–52.0)
Hemoglobin: 9.4 g/dL — ABNORMAL LOW (ref 13.0–17.0)
MCH: 26.8 pg (ref 26.0–34.0)
MCHC: 33.7 g/dL (ref 30.0–36.0)
MCV: 79.5 fL — ABNORMAL LOW (ref 80.0–100.0)
Platelets: 207 10*3/uL (ref 150–400)
RBC: 3.51 MIL/uL — ABNORMAL LOW (ref 4.22–5.81)
RDW: 14.5 % (ref 11.5–15.5)
WBC: 9.5 10*3/uL (ref 4.0–10.5)
nRBC: 0 % (ref 0.0–0.2)

## 2021-08-17 LAB — PHOSPHORUS: Phosphorus: 3.6 mg/dL (ref 2.5–4.6)

## 2021-08-17 LAB — MAGNESIUM: Magnesium: 1.4 mg/dL — ABNORMAL LOW (ref 1.7–2.4)

## 2021-08-17 LAB — PARATHYROID HORMONE, INTACT (NO CA): PTH: 13 pg/mL — ABNORMAL LOW (ref 15–65)

## 2021-08-17 MED ORDER — ALLOPURINOL 100 MG PO TABS
50.0000 mg | ORAL_TABLET | Freq: Every day | ORAL | Status: DC
  Administered 2021-08-17 – 2021-08-22 (×6): 50 mg via ORAL
  Filled 2021-08-17 (×6): qty 1

## 2021-08-17 MED ORDER — MAGNESIUM SULFATE 2 GM/50ML IV SOLN
2.0000 g | Freq: Once | INTRAVENOUS | Status: AC
Start: 2021-08-17 — End: 2021-08-17
  Administered 2021-08-17: 2 g via INTRAVENOUS
  Filled 2021-08-17: qty 50

## 2021-08-17 NOTE — Progress Notes (Signed)
[facility]  PROGRESS NOTE   Subjective:   Patient seen at bedside.  Family in attendance.  Feels better than yesterday. Was given pamidronate last night.  Patient has been on IV fluids. Urine output has been excellent.  Objective:  Vital signs: Blood pressure 126/65, pulse 95, temperature 98.4 F (36.9 C), resp. rate 18, height 6' (1.829 m), weight 108 kg, SpO2 100 %.  Intake/Output Summary (Last 24 hours) at 08/17/2021 2003 Last data filed at 08/17/2021 1900 Gross per 24 hour  Intake 480 ml  Output --  Net 480 ml   Filed Weights   08/14/21 0858  Weight: 108 kg     Physical Exam: General:  No acute distress  Head:  Normocephalic, atraumatic. Moist oral mucosal membranes  Eyes:  Anicteric  Neck:  Supple  Lungs:   Clear to auscultation, normal effort  Heart:  S1S2 no rubs  Abdomen:   Soft, nontender, bowel sounds present  Extremities:  peripheral edema.  Neurologic:  Awake, alert, following commands  Skin:  No lesions  Access:     Basic Metabolic Panel: Recent Labs  Lab 08/14/21 0941 08/14/21 1247 08/15/21 0219 08/15/21 0847 08/15/21 1404 08/15/21 2023 08/16/21 0216 08/16/21 0854 08/17/21 0338  NA 127*   < > 128*   < > 129* 127* 130* 129* 134*  K 3.4*   < > 3.3*   < > 3.5 3.5 3.7 3.9 3.8  CL 95*   < > 101   < > 104 102 105 104 108  CO2 23   < > 23   < > 21* 23 22 22 26  GLUCOSE 77   < > 78   < > 104* 113* 87 105* 101*  BUN 73*   < > 68*   < > 58* 56* 53* 46* 37*  CREATININE 6.60*   < > 5.12*   < > 4.21* 3.79* 3.58* 3.27* 3.03*  CALCIUM 14.3*   < > 11.1*   < > 11.1* 11.2* 11.4* 12.2* 11.9*  MG 1.6*  --  1.8  --   --   --   --   --  1.4*  PHOS >30.0*  --  3.9  --   --   --   --   --  3.6   < > = values in this interval not displayed.    CBC: Recent Labs  Lab 08/14/21 0941 08/15/21 0219 08/15/21 1552 08/16/21 0216 08/17/21 0338  WBC 10.2 8.5 7.8 7.3 9.5  NEUTROABS 7.3  --  4.2  --   --   HGB 11.2* 10.5* 10.1* 10.0* 9.4*  HCT 33.0*  30.3* 29.6* 30.1* 27.9*  MCV 78.9* 78.1* 78.3* 79.4* 79.5*  PLT 206 194 199 198 207     Urinalysis: No results for input(s): "COLORURINE", "LABSPEC", "PHURINE", "GLUCOSEU", "HGBUR", "BILIRUBINUR", "KETONESUR", "PROTEINUR", "UROBILINOGEN", "NITRITE", "LEUKOCYTESUR" in the last 72 hours.  Invalid input(s): "APPERANCEUR"    Imaging: No results found.   Medications:    sodium chloride 75 mL/hr at 08/16/21 2251    allopurinol  50 mg Oral Daily   vitamin B-12  1,000 mcg Oral Daily   folic acid  1 mg Oral Daily   heparin  5,000 Units Subcutaneous Q8H   LORazepam  0-4 mg Intravenous Q12H   multivitamin with minerals  1 tablet Oral Daily   nicotine  21 mg Transdermal Daily   senna-docusate  1 tablet Oral BID   thiamine  100 mg Oral Daily   Or     thiamine  100 mg Intravenous Daily    Assessment/ Plan:     Principal Problem:   AKI (acute kidney injury) ([place]) Active Problems:   Electrolyte disturbance   Hypercalcemia   Hypokalemia   Hyponatremia   Microcytic anemia   Smoking   Alcohol use   Sciatica   Abnormal LFTs   Hypomagnesemia   Hyperphosphatemia   Fatigue   B12 deficiency   Bone lesion   Hyperuricemia  Mr. Tanner [name] is a 36 y.o.  male with past medical history of alcohol and tobacco use, anxiety, and sciatica, who was admitted to [facility] on 08/14/2021 for Hypercalcemia [E83.52] Electrolyte disturbance [E87.8] Abnormal laboratory test result [R89.9] AKI (acute kidney injury) ([place]) [N17.9] Fatigue, unspecified type [R53.83]   #1: Acute kidney injury: Patient with acute kidney injury most likely secondary to metastatic myeloma with a component of prerenal azotemia.  Serologies and a bone marrow biopsy report are still pending.  Hematology/oncology note appreciated.  We will continue IV fluid resuscitation with isotonic saline.  Monitor renal function closely.   #2: Hypercalcemia: Hypercalcemia most likely secondary to myeloma.  Patient received  calcitonin and also was given pamidronate 30 mg last night.   #3: Hyponatremia: We will continue with isotonic saline.  Sodium has improved significantly.  #4: Hyperuricemia: Patient was started on allopurinol at 50 mg daily.  His G6PD levels are still pending, if the levels are normal he will be given respiratory case.  #5: Hyper phosphatemia: Phosphate levels have now improved.  #6: Hypomagnesemia: Magnesium supplements ordered.   Spoke to patient's mother at bedside in detail.  We will continue to follow.   LOS: [place], MD [facility] 8/13/20238:03 PM

## 2021-08-17 NOTE — Progress Notes (Signed)
Hematology/Oncology Progress note [phone] [phone]     Patient Care Team: Pcp, No as PCP - General   Name of the patient: Tanner [name]  [id number]  [birth year]  Date of visit: 08/17/21   INTERVAL HISTORY-  Denies any new complaints.  Mother at the bedside.     No Known Allergies  Patient Active Problem List   Diagnosis Date Noted   Fatigue    B12 deficiency    Bone lesion    Electrolyte disturbance 08/14/2021   Hypercalcemia 08/14/2021   Hypokalemia 08/14/2021   Hyponatremia 08/14/2021   Microcytic anemia 08/14/2021   Smoking 08/14/2021   Alcohol use 08/14/2021   Sciatica 08/14/2021   Abnormal LFTs 08/14/2021   AKI (acute kidney injury) ([place]) 08/14/2021   Hypomagnesemia 08/14/2021   Hyperphosphatemia 08/14/2021     Past Medical History:  Diagnosis Date   Alcohol use    Sciatica    Smoking      Past Surgical History:  Procedure Laterality Date   INGUINAL HERNIA REPAIR      Social History   Socioeconomic History   Marital status: Single    Spouse name: Not on file   Number of children: Not on file   Years of education: Not on file   Highest education level: Not on file  Occupational History   Not on file  Tobacco Use   Smoking status: Every Day    Types: E-cigarettes   Smokeless tobacco: Never  Vaping Use   Vaping Use: Every day  Substance and Sexual Activity   Alcohol use: Yes    Alcohol/week: 24.0 standard drinks of alcohol    Types: 24 Cans of beer per week   Drug use: Never   Sexual activity: Not on file  Other Topics Concern   Not on file  Social History Narrative   Not on file   Social Determinants of Health   Financial Resource Strain: Not on file  Food Insecurity: Not on file  Transportation Needs: Not on file  Physical Activity: Not on file  Stress: Not on file  Social Connections: Not on file  Intimate Partner Violence: Not on file     Family History  Problem Relation Age of Onset    Breast cancer Mother    Alcoholism Father      Current Facility-Administered Medications:    0.9 %  sodium chloride infusion, , Intravenous, Continuous, Ivor Costa, MD, Last Rate: 75 mL/hr at 08/16/21 2251, New Bag at 08/16/21 2251   acetaminophen (TYLENOL) tablet 650 mg, 650 mg, Oral, Q6H PRN, Wyvonnia Dusky, MD, 650 mg at 08/16/21 2016   cyanocobalamin (VITAMIN B12) tablet 1,000 mcg, 1,000 mcg, Oral, Daily, [name], MD, 1,000 mcg at 78/46/96 2952   folic acid (FOLVITE) tablet 1 mg, 1 mg, Oral, Daily, Ivor Costa, MD, 1 mg at 08/17/21 0937   heparin injection 5,000 Units, 5,000 Units, Subcutaneous, Q8H, Ivor Costa, MD, 5,000 Units at 08/17/21 0553   [EXPIRED] LORazepam (ATIVAN) injection 0-4 mg, 0-4 mg, Intravenous, Q6H **FOLLOWED BY** LORazepam (ATIVAN) injection 0-4 mg, 0-4 mg, Intravenous, Q12H, Ivor Costa, MD   morphine (PF) 2 MG/ML injection 1 mg, 1 mg, Intravenous, Q4H PRN, Wyvonnia Dusky, MD   multivitamin with minerals tablet 1 tablet, 1 tablet, Oral, Daily, Ivor Costa, MD, 1 tablet at 08/17/21 8413   nicotine (NICODERM CQ - dosed in mg/24 hours) patch 21 mg, 21 mg, Transdermal, Daily, Ivor Costa, MD   ondansetron (ZOFRAN) injection 4 mg, 4  mg, Intravenous, Q8H PRN, Niu, Xilin, MD   oxyCODONE (Oxy IR/ROXICODONE) immediate release tablet 5 mg, 5 mg, Oral, Q6H PRN, Niu, Xilin, MD   oxyCODONE-acetaminophen (PERCOCET/ROXICET) 5-325 MG per tablet 1 tablet, 1 tablet, Oral, Q6H PRN, Williams, Jamiese M, MD   polyethylene glycol (MIRALAX / GLYCOLAX) packet 17 g, 17 g, Oral, Daily PRN, Niu, Xilin, MD   senna-docusate (Senokot-S) tablet 1 tablet, 1 tablet, Oral, BID, Niu, Xilin, MD, 1 tablet at 08/16/21 2003   thiamine (VITAMIN B1) tablet 100 mg, 100 mg, Oral, Daily, 100 mg at 08/17/21 0937 **OR** thiamine (VITAMIN B1) injection 100 mg, 100 mg, Intravenous, Daily, Niu, Xilin, MD   Physical exam:  Vitals:   08/16/21 1641 08/16/21 2141 08/17/21 0551 08/17/21 0746  BP: 122/62  122/84 117/71 132/80  Pulse: 94 91 88 91  Resp: 20 16 16 16  Temp: 99.3 F (37.4 C) 99.1 F (37.3 C) 98.2 F (36.8 C) 98.4 F (36.9 C)  TempSrc:    Oral  SpO2: 100% 100% 100% 100%  Weight:      Height:       Physical Exam Eyes:     General: No scleral icterus. Cardiovascular:     Rate and Rhythm: Normal rate.  Pulmonary:     Effort: Pulmonary effort is normal. No respiratory distress.  Abdominal:     General: There is no distension.  Musculoskeletal:     Cervical back: Neck supple.  Neurological:     Mental Status: He is alert and oriented to person, place, and time. Mental status is at baseline.  Psychiatric:        Mood and Affect: Mood normal.      Labs.    Latest Ref Rng & Units 08/17/2021    3:38 AM 08/16/2021    2:16 AM 08/15/2021    3:52 PM  CBC  WBC 4.0 - 10.5 K/uL 9.5  7.3  7.8   Hemoglobin 13.0 - 17.0 g/dL 9.4  10.0  10.1   Hematocrit 39.0 - 52.0 % 27.9  30.1  29.6   Platelets 150 - 400 K/uL 207  198  199       Latest Ref Rng & Units 08/17/2021    3:38 AM 08/16/2021    8:54 AM 08/16/2021    2:16 AM  CMP  Glucose 70 - 99 mg/dL 101  105  87   BUN 6 - 20 mg/dL 37  46  53   Creatinine 0.61 - 1.24 mg/dL 3.03  3.27  3.58   Sodium 135 - 145 mmol/L 134  129  130   Potassium 3.5 - 5.1 mmol/L 3.8  3.9  3.7   Chloride 98 - 111 mmol/L 108  104  105   CO2 22 - 32 mmol/L 26  22  22   Calcium 8.9 - 10.3 mg/dL 11.9  12.2  11.4   Total Protein 6.5 - 8.1 g/dL   11.2   Total Bilirubin 0.3 - 1.2 mg/dL   0.8   Alkaline Phos 38 - 126 U/L   46   AST 15 - 41 U/L   34   ALT 0 - 44 U/L   42       RADIOGRAPHIC STUDIES: I have personally reviewed the radiological images as listed and agreed with the findings in the report. CT BONE MARROW BIOPSY & ASPIRATION  Result Date: 08/15/2021 INDICATION: Concern for multiple myeloma EXAM: CT BONE MARROW BIOPSY AND ASPIRATION MEDICATIONS: None. ANESTHESIA/SEDATION: Moderate (conscious) sedation was employed during   this procedure. A  total of Versed 1 mg and Fentanyl 100 mcg was administered intravenously. Moderate Sedation Time: 15 minutes. The patient's level of consciousness and vital signs were monitored continuously by radiology nursing throughout the procedure under my direct supervision. FLUOROSCOPY TIME:  N/a COMPLICATIONS: None immediate. PROCEDURE: Informed written consent was obtained from the patient after a thorough discussion of the procedural risks, benefits and alternatives. All questions were addressed. Maximal Sterile Barrier Technique was utilized including caps, mask, sterile gowns, sterile gloves, sterile drape, hand hygiene and skin antiseptic. A timeout was performed prior to the initiation of the procedure. The patient was placed prone on the CT exam table. Limited CT of the pelvis was performed for planning purposes. Skin entry site was marked, and the overlying skin was prepped and draped in the standard sterile fashion. Local analgesia was obtained with 1% lidocaine. Using CT guidance, an 11 gauge needle was advanced just deep to the cortex of the right posterior ilium. Subsequently, bone marrow aspiration and core biopsy were performed. Specimens were submitted to lab/pathology for handling. Hemostasis was achieved with manual pressure, and a clean dressing was placed. The patient tolerated the procedure well without immediate complication. IMPRESSION: Successful CT-guided bone marrow aspiration core biopsy of the right posterior ilium. Electronically Signed   By: Yasser  El-Abd M.D.   On: 08/15/2021 13:36   CT CHEST ABDOMEN PELVIS WO CONTRAST  Result Date: 08/14/2021 CLINICAL DATA:  Weakness, lethargy.  Evaluate for occult malignancy. EXAM: CT CHEST, ABDOMEN AND PELVIS WITHOUT CONTRAST TECHNIQUE: Multidetector CT imaging of the chest, abdomen and pelvis was performed following the standard protocol without IV contrast. RADIATION DOSE REDUCTION: This exam was performed according to the departmental  dose-optimization program which includes automated exposure control, adjustment of the mA and/or kV according to patient size and/or use of iterative reconstruction technique. COMPARISON:  None Available. FINDINGS: CT CHEST FINDINGS Cardiovascular: Heart is normal size. Aorta is normal caliber. Mediastinum/Nodes: No mediastinal, hilar, or axillary adenopathy. Trachea and esophagus are unremarkable. Thyroid unremarkable. Lungs/Pleura: Lungs are clear. No focal airspace opacities or suspicious nodules. No effusions. Musculoskeletal: Chest wall soft tissues are unremarkable. Mild appearance with areas of lucency throughout the thoracic spine, sternum and ribs. This can be seen with metastases or myeloma. CT ABDOMEN PELVIS FINDINGS Hepatobiliary: No focal hepatic abnormality. Gallbladder unremarkable. Pancreas: No focal abnormality or ductal dilatation. Spleen: No focal abnormality.  Normal size. Adrenals/Urinary Tract: No adrenal abnormality. No focal renal abnormality. No stones or hydronephrosis. Urinary bladder is unremarkable. Stomach/Bowel: Sigmoid diverticulosis. No active diverticulitis. Stomach and small bowel decompressed, unremarkable. Normal appendix. Vascular/Lymphatic: No evidence of aneurysm or adenopathy. Reproductive: No visible focal abnormality. Other: No free fluid or free air. Musculoskeletal: Lucencies noted throughout the lumbar spine and bony pelvis, best seen in the left iliac bone. There is cortical destruction seen in the left iliac bone. This could be seen with metastasis or myeloma. IMPRESSION: Numerous lucencies throughout the bony structures, most pronounced throughout the spine and in the left iliac bone. This could reflect metastases or myeloma. Otherwise no acute findings in the chest, abdomen or pelvis. Sigmoid diverticulosis. Electronically Signed   By: Kevin  [name] M.D.   On: 08/14/2021 18:24   US RENAL  Result Date: 08/14/2021 CLINICAL DATA:  Acute kidney injury EXAM: RENAL /  URINARY TRACT ULTRASOUND COMPLETE COMPARISON:  None Available. FINDINGS: Right Kidney: Renal measurements: 12.4 x 6.2 x 7.2 cm = volume: 292 mL. Echogenicity may be mildly increased. No mass or hydronephrosis visualized. Left   Kidney: Renal measurements: 12.2 x 6.6 x 5.8 cm = volume: 244 mL. Echogenicity may be mildly increased. No mass or hydronephrosis visualized. Bladder: Appears normal for degree of bladder distention. Other: None. IMPRESSION: No hydronephrosis. Possible mild increase in renal echogenicity could reflect medical renal disease. Electronically Signed   By: Praneil  Patel M.D.   On: 08/14/2021 12:17   DG Chest 2 View  Result Date: 08/14/2021 CLINICAL DATA:  Short of breath EXAM: CHEST - 2 VIEW COMPARISON:  01/11/2020 FINDINGS: The heart size and mediastinal contours are within normal limits. Both lungs are clear. The visualized skeletal structures are unremarkable. IMPRESSION: No active cardiopulmonary disease. Electronically Signed   By: Charles  Clark M.D.   On: 08/14/2021 10:38    Assessment and plan-   # #Anemia, acute kidney failure, hypercalcemia and multiple bone lesions on CT scan.  No suspicious lymphadenopathy on CT. Concerning for underlying bone marrow disorders.  multiple myeloma panel, light chain ratio, UPEP, flow cytometry, PTH RP pending,,  LDH is not elevated, ANA negative, PSA is normal, PTH is decreased at 8. Uric acid level is elevated at 11.9.  I will start patient on allopurinol-renal dosing 50 mg daily.  Check G6PD level.  If normal, consider Rasburicase. Continue IV fluid hydration supportive care.  Awaiting for bone marrow biopsy results..  Thank you for allowing me to participate in the care of this patient.    , MD, PhD Hematology Oncology 08/17/2021  

## 2021-08-17 NOTE — Progress Notes (Signed)
PROGRESS NOTE    Tanner [name]  WUJ:[id number] DOB: [birth year] DOA: 08/14/2021 PCP: Pcp, No  Assessment & Plan:   Principal Problem:   AKI (acute kidney injury) ([place]) Active Problems:   Electrolyte disturbance   Hypercalcemia   Hypokalemia   Hyponatremia   Hypomagnesemia   Hyperphosphatemia   Microcytic anemia   Smoking   Alcohol use   Abnormal LFTs   Sciatica   Fatigue   B12 deficiency   Bone lesion  Assessment and Plan: AKI: possibly secondary to multiple myeloma. Continue on IVFs. No NSAIDs. CT abd/pelvis shows numerous lucencies throughout bony structures could reflect metastasis or myeloma.  UPEP pending. S/p bone marrow biopsy 08/15/21 and still waiting on results. Onco following and recs apprec   Hypercalcemia: likely secondary to possibly MM. Still elevated but trending down. S/p calcitonin x 1 and pamidronate x 1. Continue on IVFs. Nephro following and recs apprec    Hypokalemia: WNL today     Hyponatremia: almost WNL. Continue on IVFs    Hypomagnesemia: magnesium sulfate ordered    Hyperphosphatemia: resolved   Microcytic anemia: etiology unclear, possibly secondary to MM. H&H are trending down but no need for a transfusion currently. Iron is WNL    Smoking: received smoking cessation counseling. Nicotine patch to prevent w/drawl  Alcohol use: received alcohol cessation counseling. Continue on CIWA protocol    Transaminitis: resolved    Sciatica: oxycodone prn . Holding prednisone       DVT prophylaxis: SCDs Code Status: full  Family Communication:  Disposition Plan:  likely d/c back home  Level of care: Med-Surg  Status is: Inpatient Remains inpatient appropriate because: severity of illness   Consultants:  Nephro Heme/onco   Procedures:  s/p bone marrow biopsy   Antimicrobials:   Subjective: Pt is feeling pretty good today so far.   Objective: Vitals:   08/16/21 0826 08/16/21 1641 08/16/21 2141 08/17/21 0551  BP:  117/67 122/62 122/84 117/71  Pulse: 82 94 91 88  Resp: 18 20 16 16   Temp: 98.3 F (36.8 C) 99.3 F (37.4 C) 99.1 F (37.3 C) 98.2 F (36.8 C)  TempSrc:      SpO2: 100% 100% 100% 100%  Weight:      Height:        Intake/Output Summary (Last 24 hours) at 08/17/2021 0733 Last data filed at 08/16/2021 1501 Gross per 24 hour  Intake 1873.99 ml  Output --  Net 1873.99 ml   Filed Weights   08/14/21 0858  Weight: 108 kg    Examination:  General exam: Appears comfortable & calm  Respiratory system: clear breath sounds b/l. No rales, wheezes  Cardiovascular system: S1 & S2+. No rubs or clicks  Gastrointestinal system: abd is soft, NT, obese & normal bowel sounds  Central nervous system: alert and oriented. Moves all extremities  Psychiatry: judgement and insight appears normal. Appropriate mood and affect     Data Reviewed: I have personally reviewed following labs and imaging studies  CBC: Recent Labs  Lab 08/14/21 0941 08/15/21 0219 08/15/21 1552 08/16/21 0216 08/17/21 0338  WBC 10.2 8.5 7.8 7.3 9.5  NEUTROABS 7.3  --  4.2  --   --   HGB 11.2* 10.5* 10.1* 10.0* 9.4*  HCT 33.0* 30.3* 29.6* 30.1* 27.9*  MCV 78.9* 78.1* 78.3* 79.4* 79.5*  PLT 206 194 199 198 956   Basic Metabolic Panel: Recent Labs  Lab 08/14/21 0941 08/14/21 1247 08/15/21 0219 08/15/21 0847 08/15/21 1404 08/15/21 2023 08/16/21 0216 08/16/21  9179 08/17/21 0338  NA 127*   < > 128*   < > 129* 127* 130* 129* 134*  K 3.4*   < > 3.3*   < > 3.5 3.5 3.7 3.9 3.8  CL 95*   < > 101   < > 104 102 105 104 108  CO2 23   < > 23   < > 21* 23 22 22 26   GLUCOSE 77   < > 78   < > 104* 113* 87 105* 101*  BUN 73*   < > 68*   < > 58* 56* 53* 46* 37*  CREATININE 6.60*   < > 5.12*   < > 4.21* 3.79* 3.58* 3.27* 3.03*  CALCIUM 14.3*   < > 11.1*   < > 11.1* 11.2* 11.4* 12.2* 11.9*  MG 1.6*  --  1.8  --   --   --   --   --  1.4*  PHOS >30.0*  --  3.9  --   --   --   --   --  3.6   < > = values in this interval not  displayed.   GFR: Estimated Creatinine Clearance: 43.6 mL/min (A) (by C-G formula based on SCr of 3.03 mg/dL (H)). Liver Function Tests: Recent Labs  Lab 08/14/21 0941 08/16/21 0216  AST 45* 34  ALT 57* 42  ALKPHOS 52 46  BILITOT 1.3* 0.8  PROT <3.0* 11.2*  ALBUMIN 2.8* 2.2*   No results for input(s): "LIPASE", "AMYLASE" in the last 168 hours. Recent Labs  Lab 08/14/21 1247  AMMONIA 34   Coagulation Profile: Recent Labs  Lab 08/14/21 1247  INR 1.1   Cardiac Enzymes: Recent Labs  Lab 08/14/21 0941  CKTOTAL 301   BNP (last 3 results) No results for input(s): "PROBNP" in the last 8760 hours. HbA1C: No results for input(s): "HGBA1C" in the last 72 hours. CBG: Recent Labs  Lab 08/11/21 1558  GLUCAP 78   Lipid Profile: No results for input(s): "CHOL", "HDL", "LDLCALC", "TRIG", "CHOLHDL", "LDLDIRECT" in the last 72 hours. Thyroid Function Tests: No results for input(s): "TSH", "T4TOTAL", "FREET4", "T3FREE", "THYROIDAB" in the last 72 hours. Anemia Panel: Recent Labs    08/14/21 0941 08/14/21 1247  VITAMINB12  --  146*  FOLATE  --  9.1  FERRITIN  --  922*  TIBC  --  311  IRON  --  121  RETICCTPCT 0.9  --    Sepsis Labs: No results for input(s): "PROCALCITON", "LATICACIDVEN" in the last 168 hours.  Recent Results (from the past 240 hour(s))  Resp Panel by RT-PCR (Flu A&B, Covid) Anterior Nasal Swab     Status: None   Collection Time: 08/11/21  2:39 PM   Specimen: Anterior Nasal Swab  Result Value Ref Range Status   SARS Coronavirus 2 by RT PCR NEGATIVE NEGATIVE Final    Comment: (NOTE) SARS-CoV-2 target nucleic acids are NOT DETECTED.  The SARS-CoV-2 RNA is generally detectable in upper respiratory specimens during the acute phase of infection. The lowest concentration of SARS-CoV-2 viral copies this assay can detect is 138 copies/mL. A negative result does not preclude SARS-Cov-2 infection and should not be used as the sole basis for treatment  or other patient management decisions. A negative result may occur with  improper specimen collection/handling, submission of specimen other than nasopharyngeal swab, presence of viral mutation(s) within the areas targeted by this assay, and inadequate number of viral copies(<138 copies/mL). A negative result must be combined with  clinical observations, patient history, and epidemiological information. The expected result is Negative.  Fact Sheet for Patients:  EntrepreneurPulse.com.au  Fact Sheet for Healthcare Providers:  IncredibleEmployment.be  This test is no t yet approved or cleared by the [place] FDA and  has been authorized for detection and/or diagnosis of SARS-CoV-2 by FDA under an Emergency Use Authorization (EUA). This EUA will remain  in effect (meaning this test can be used) for the duration of the COVID-19 declaration under Section 564(b)(1) of the Act, 21 U.S.C.section 360bbb-3(b)(1), unless the authorization is terminated  or revoked sooner.       Influenza A by PCR NEGATIVE NEGATIVE Final   Influenza B by PCR NEGATIVE NEGATIVE Final    Comment: (NOTE) The Xpert Xpress SARS-CoV-2/FLU/RSV plus assay is intended as an aid in the diagnosis of influenza from Nasopharyngeal swab specimens and should not be used as a sole basis for treatment. Nasal washings and aspirates are unacceptable for Xpert Xpress SARS-CoV-2/FLU/RSV testing.  Fact Sheet for Patients: EntrepreneurPulse.com.au  Fact Sheet for Healthcare Providers: IncredibleEmployment.be  This test is not yet approved or cleared by the [place] FDA and has been authorized for detection and/or diagnosis of SARS-CoV-2 by FDA under an Emergency Use Authorization (EUA). This EUA will remain in effect (meaning this test can be used) for the duration of the COVID-19 declaration under Section 564(b)(1) of the Act, 21 U.S.C. section  360bbb-3(b)(1), unless the authorization is terminated or revoked.  Performed at [facility], [address], [place], [place] [address]   Urine Culture     Status: Abnormal   Collection Time: 08/11/21  4:17 PM   Specimen: Urine, Random  Result Value Ref Range Status   Specimen Description   Final    URINE, RANDOM Performed at [facility], [address], [place], [place] [address]    Special Requests   Final    NONE Performed at [facility], [address] [place], [place] [address]    Culture (A)  Final    <10,000 COLONIES/mL INSIGNIFICANT GROWTH Performed at [facility], [place] [address] [place], [place] [address]    Report Status 08/12/2021 FINAL  Final         Radiology Studies: CT BONE MARROW BIOPSY & ASPIRATION  Result Date: 08/15/2021 INDICATION: Concern for multiple myeloma EXAM: CT BONE MARROW BIOPSY AND ASPIRATION MEDICATIONS: None. ANESTHESIA/SEDATION: Moderate (conscious) sedation was employed during this procedure. A total of Versed 1 mg and Fentanyl 100 mcg was administered intravenously. Moderate Sedation Time: 15 minutes. The patient's level of consciousness and vital signs were monitored continuously by radiology nursing throughout the procedure under my direct supervision. FLUOROSCOPY TIME:  N/a COMPLICATIONS: None immediate. PROCEDURE: Informed written consent was obtained from the patient after a thorough discussion of the procedural risks, benefits and alternatives. All questions were addressed. Maximal Sterile Barrier Technique was utilized including caps, mask, sterile gowns, sterile gloves, sterile drape, hand hygiene and skin antiseptic. A timeout was performed prior to the initiation of the procedure. The patient was placed prone on the CT exam table. Limited CT of the pelvis was performed for planning purposes. Skin entry site was marked, and the overlying skin was prepped and draped in the standard sterile fashion.  Local analgesia was obtained with 1% lidocaine. Using CT guidance, an 11 gauge needle was advanced just deep to the cortex of the right posterior ilium. Subsequently, bone marrow aspiration and core biopsy were performed. Specimens were submitted to lab/pathology for  handling. Hemostasis was achieved with manual pressure, and a clean dressing was placed. The patient tolerated the procedure well without immediate complication. IMPRESSION: Successful CT-guided bone marrow aspiration core biopsy of the right posterior ilium. Electronically Signed   By: Albin Felling M.D.   On: 08/15/2021 13:36        Scheduled Meds:  vitamin B-12  1,000 mcg Oral Daily   folic acid  1 mg Oral Daily   heparin  5,000 Units Subcutaneous Q8H   LORazepam  0-4 mg Intravenous Q12H   multivitamin with minerals  1 tablet Oral Daily   nicotine  21 mg Transdermal Daily   senna-docusate  1 tablet Oral BID   thiamine  100 mg Oral Daily   Or   thiamine  100 mg Intravenous Daily   Continuous Infusions:  sodium chloride 75 mL/hr at 08/16/21 2251   magnesium sulfate bolus IVPB       LOS: 3 days    Time spent: 25 mins     Wyvonnia Dusky, MD [facility] Pager [phone]  If 7PM-7AM, please contact night-coverage [email or web address] 08/17/2021, 7:33 AM

## 2021-08-18 DIAGNOSIS — N179 Acute kidney failure, unspecified: Secondary | ICD-10-CM | POA: Diagnosis not present

## 2021-08-18 LAB — BASIC METABOLIC PANEL
Anion gap: 2 — ABNORMAL LOW (ref 5–15)
BUN: 30 mg/dL — ABNORMAL HIGH (ref 6–20)
CO2: 22 mmol/L (ref 22–32)
Calcium: 11.7 mg/dL — ABNORMAL HIGH (ref 8.9–10.3)
Chloride: 106 mmol/L (ref 98–111)
Creatinine, Ser: 3.21 mg/dL — ABNORMAL HIGH (ref 0.61–1.24)
GFR, Estimated: 25 mL/min — ABNORMAL LOW (ref >60)
Glucose, Bld: 88 mg/dL (ref 70–99)
Potassium: 4.2 mmol/L (ref 3.5–5.1)
Sodium: 130 mmol/L — ABNORMAL LOW (ref 135–145)

## 2021-08-18 LAB — MAGNESIUM: Magnesium: 1.4 mg/dL — ABNORMAL LOW (ref 1.7–2.4)

## 2021-08-18 LAB — CBC
HCT: 28.6 % — ABNORMAL LOW (ref 39.0–52.0)
Hemoglobin: 9.5 g/dL — ABNORMAL LOW (ref 13.0–17.0)
MCH: 26.6 pg (ref 26.0–34.0)
MCHC: 33.2 g/dL (ref 30.0–36.0)
MCV: 80.1 fL (ref 80.0–100.0)
Platelets: 214 10*3/uL (ref 150–400)
RBC: 3.57 MIL/uL — ABNORMAL LOW (ref 4.22–5.81)
RDW: 14.6 % (ref 11.5–15.5)
WBC: 10.2 10*3/uL (ref 4.0–10.5)
nRBC: 0 % (ref 0.0–0.2)

## 2021-08-18 LAB — KAPPA/LAMBDA LIGHT CHAINS
Kappa free light chain: 9 mg/L (ref 3.3–19.4)
Kappa, lambda light chain ratio: 0.01 — ABNORMAL LOW (ref 0.26–1.65)
Lambda free light chains: 1007 mg/L — ABNORMAL HIGH (ref 5.7–26.3)

## 2021-08-18 LAB — COMP PANEL: LEUKEMIA/LYMPHOMA

## 2021-08-18 LAB — URIC ACID: Uric Acid, Serum: 8.7 mg/dL — ABNORMAL HIGH (ref 3.7–8.6)

## 2021-08-18 LAB — ANCA PROFILE
Anti-MPO Antibodies: <0.2 units (ref 0.0–0.9)
Anti-PR3 Antibodies: <0.2 units (ref 0.0–0.9)
Atypical P-ANCA titer: <1:20 {titer}
C-ANCA: <1:20 {titer}
P-ANCA: <1:20 {titer}

## 2021-08-18 LAB — PHOSPHORUS: Phosphorus: 4.6 mg/dL (ref 2.5–4.6)

## 2021-08-18 MED ORDER — MAGNESIUM SULFATE 4 GM/100ML IV SOLN
4.0000 g | Freq: Once | INTRAVENOUS | Status: AC
  Administered 2021-08-18: 4 g via INTRAVENOUS
  Filled 2021-08-18: qty 100

## 2021-08-18 NOTE — Progress Notes (Signed)
PROGRESS NOTE    Tanner [name]  XUX:[id number] DOB: 04/30/1985 DOA: 08/14/2021 PCP: Pcp, No  Assessment & Plan:   Principal Problem:   AKI (acute kidney injury) ([place]) Active Problems:   Electrolyte disturbance   Hypercalcemia   Hypokalemia   Hyponatremia   Hypomagnesemia   Hyperphosphatemia   Microcytic anemia   Smoking   Alcohol use   Abnormal LFTs   Sciatica   Fatigue   B12 deficiency   Bone lesion   Hyperuricemia  Assessment and Plan: AKI: possibly secondary to multiple myeloma. Continue on IVFs. CT abd/pelvis shows numerous lucencies throughout bony structures could reflect metastasis or myeloma.  UPEP pending. S/p bone marrow biopsy 08/15/21 and still waiting on results. Onco & nephro following and recs apprec    Hypercalcemia: likely secondary to possible MM. Labile.  S/p calcitonin x 1 and pamidronate x 1. Continue on IVFs. Nephro following and recs apprec   Elevated uric acid: started on allopurinol as per onco. Waiting on G6PD level.    Hypokalemia: WNL today      Hyponatremia: labile. Continue on IVFs    Hypomagnesemia: mg sulfate ordered    Hyperphosphatemia: resolved   Microcytic anemia: etiology unclear, possibly secondary to MM. Iron is WNL. H&H are labile. No need for a transfusion currently    Smoking: received smoking cessation counseling. Nicotine patch to prevent w/drawl   Alcohol use: received alcohol cessation counseling. Continue on CIWA protocol     Transaminitis: resolved    Sciatica: oxycodone prn. Holding prednisone       DVT prophylaxis: SCDs Code Status: full  Family Communication:  Disposition Plan:  likely d/c back home  Level of care: Med-Surg  Status is: Inpatient Remains inpatient appropriate because: severity of illness   Consultants:  Nephro Heme/onco   Procedures:  s/p bone marrow biopsy   Antimicrobials:   Subjective: Pt denies any complaints today so far   Objective: Vitals:   08/17/21  0746 08/17/21 1729 08/17/21 2205 08/18/21 0531  BP: 132/80 126/65 126/63 116/84  Pulse: 91 95 95 97  Resp: 16 18 16 16   Temp: 98.4 F (36.9 C) 98.4 F (36.9 C) 98.6 F (37 C) 99.1 F (37.3 C)  TempSrc: Oral  Oral Oral  SpO2: 100% 100% 99% 100%  Weight:      Height:        Intake/Output Summary (Last 24 hours) at 08/18/2021 0733 Last data filed at 08/17/2021 1900 Gross per 24 hour  Intake 480 ml  Output --  Net 480 ml   Filed Weights   08/14/21 0858  Weight: 108 kg    Examination:  General exam: Appears comfortable  Respiratory system: clear breath sounds b/l. No rubs or clicks  Cardiovascular system: S1/S2+. No rubs or clicks  Gastrointestinal system: abd is soft, NT, obese & normal bowel sounds  Central nervous system: Alert and oriented. Moves all extremities  Psychiatry: judgement and insight appears normal. Appropriate mood and affect     Data Reviewed: I have personally reviewed following labs and imaging studies  CBC: Recent Labs  Lab 08/14/21 0941 08/15/21 0219 08/15/21 1552 08/16/21 0216 08/17/21 0338 08/18/21 0426  WBC 10.2 8.5 7.8 7.3 9.5 10.2  NEUTROABS 7.3  --  4.2  --   --   --   HGB 11.2* 10.5* 10.1* 10.0* 9.4* 9.5*  HCT 33.0* 30.3* 29.6* 30.1* 27.9* 28.6*  MCV 78.9* 78.1* 78.3* 79.4* 79.5* 80.1  PLT 206 194 199 198 207 214   Basic  Metabolic Panel: Recent Labs  Lab 08/14/21 0941 08/14/21 1247 08/15/21 0219 08/15/21 0847 08/15/21 2023 08/16/21 0216 08/16/21 0854 08/17/21 0338 08/18/21 0426  NA 127*   < > 128*   < > 127* 130* 129* 134* 130*  K 3.4*   < > 3.3*   < > 3.5 3.7 3.9 3.8 4.2  CL 95*   < > 101   < > 102 105 104 108 106  CO2 23   < > 23   < > 23 22 22 26 22   GLUCOSE 77   < > 78   < > 113* 87 105* 101* 88  BUN 73*   < > 68*   < > 56* 53* 46* 37* 30*  CREATININE 6.60*   < > 5.12*   < > 3.79* 3.58* 3.27* 3.03* 3.21*  CALCIUM 14.3*   < > 11.1*   < > 11.2* 11.4* 12.2* 11.9* 11.7*  MG 1.6*  --  1.8  --   --   --   --  1.4* 1.4*   PHOS >30.0*  --  3.9  --   --   --   --  3.6 4.6   < > = values in this interval not displayed.   GFR: Estimated Creatinine Clearance: 41.2 mL/min (A) (by C-G formula based on SCr of 3.21 mg/dL (H)). Liver Function Tests: Recent Labs  Lab 08/14/21 0941 08/16/21 0216  AST 45* 34  ALT 57* 42  ALKPHOS 52 46  BILITOT 1.3* 0.8  PROT <3.0* 11.2*  ALBUMIN 2.8* 2.2*   No results for input(s): "LIPASE", "AMYLASE" in the last 168 hours. Recent Labs  Lab 08/14/21 1247  AMMONIA 34   Coagulation Profile: Recent Labs  Lab 08/14/21 1247  INR 1.1   Cardiac Enzymes: Recent Labs  Lab 08/14/21 0941  CKTOTAL 301   BNP (last 3 results) No results for input(s): "PROBNP" in the last 8760 hours. HbA1C: No results for input(s): "HGBA1C" in the last 72 hours. CBG: Recent Labs  Lab 08/11/21 1558  GLUCAP 78   Lipid Profile: No results for input(s): "CHOL", "HDL", "LDLCALC", "TRIG", "CHOLHDL", "LDLDIRECT" in the last 72 hours. Thyroid Function Tests: No results for input(s): "TSH", "T4TOTAL", "FREET4", "T3FREE", "THYROIDAB" in the last 72 hours. Anemia Panel: No results for input(s): "VITAMINB12", "FOLATE", "FERRITIN", "TIBC", "IRON", "RETICCTPCT" in the last 72 hours.  Sepsis Labs: No results for input(s): "PROCALCITON", "LATICACIDVEN" in the last 168 hours.  Recent Results (from the past 240 hour(s))  Resp Panel by RT-PCR (Flu A&B, Covid) Anterior Nasal Swab     Status: None   Collection Time: 08/11/21  2:39 PM   Specimen: Anterior Nasal Swab  Result Value Ref Range Status   SARS Coronavirus 2 by RT PCR NEGATIVE NEGATIVE Final    Comment: (NOTE) SARS-CoV-2 target nucleic acids are NOT DETECTED.  The SARS-CoV-2 RNA is generally detectable in upper respiratory specimens during the acute phase of infection. The lowest concentration of SARS-CoV-2 viral copies this assay can detect is 138 copies/mL. A negative result does not preclude SARS-Cov-2 infection and should not be used  as the sole basis for treatment or other patient management decisions. A negative result may occur with  improper specimen collection/handling, submission of specimen other than nasopharyngeal swab, presence of viral mutation(s) within the areas targeted by this assay, and inadequate number of viral copies(<138 copies/mL). A negative result must be combined with clinical observations, patient history, and epidemiological information. The expected result is Negative.  Fact  Sheet for Patients:  EntrepreneurPulse.com.au  Fact Sheet for Healthcare Providers:  IncredibleEmployment.be  This test is no t yet approved or cleared by the [place] FDA and  has been authorized for detection and/or diagnosis of SARS-CoV-2 by FDA under an Emergency Use Authorization (EUA). This EUA will remain  in effect (meaning this test can be used) for the duration of the COVID-19 declaration under Section 564(b)(1) of the Act, 21 U.S.C.section 360bbb-3(b)(1), unless the authorization is terminated  or revoked sooner.       Influenza A by PCR NEGATIVE NEGATIVE Final   Influenza B by PCR NEGATIVE NEGATIVE Final    Comment: (NOTE) The Xpert Xpress SARS-CoV-2/FLU/RSV plus assay is intended as an aid in the diagnosis of influenza from Nasopharyngeal swab specimens and should not be used as a sole basis for treatment. Nasal washings and aspirates are unacceptable for Xpert Xpress SARS-CoV-2/FLU/RSV testing.  Fact Sheet for Patients: EntrepreneurPulse.com.au  Fact Sheet for Healthcare Providers: IncredibleEmployment.be  This test is not yet approved or cleared by the [place] FDA and has been authorized for detection and/or diagnosis of SARS-CoV-2 by FDA under an Emergency Use Authorization (EUA). This EUA will remain in effect (meaning this test can be used) for the duration of the COVID-19 declaration under Section 564(b)(1)  of the Act, 21 U.S.C. section 360bbb-3(b)(1), unless the authorization is terminated or revoked.  Performed at [facility], [address], [place], [place] [address]   Urine Culture     Status: Abnormal   Collection Time: 08/11/21  4:17 PM   Specimen: Urine, Random  Result Value Ref Range Status   Specimen Description   Final    URINE, RANDOM Performed at [facility], [address], [place], [place] [address]    Special Requests   Final    NONE Performed at [facility], [address] [place], [place] [address]    Culture (A)  Final    <10,000 COLONIES/mL INSIGNIFICANT GROWTH Performed at [facility], [place] [address] [place], [place] [address]    Report Status 08/12/2021 FINAL  Final         Radiology Studies: No results found.      Scheduled Meds:  allopurinol  50 mg Oral Daily   vitamin B-12  1,000 mcg Oral Daily   folic acid  1 mg Oral Daily   heparin  5,000 Units Subcutaneous Q8H   LORazepam  0-4 mg Intravenous Q12H   multivitamin with minerals  1 tablet Oral Daily   nicotine  21 mg Transdermal Daily   senna-docusate  1 tablet Oral BID   thiamine  100 mg Oral Daily   Or   thiamine  100 mg Intravenous Daily   Continuous Infusions:  sodium chloride 75 mL/hr at 08/17/21 2230   magnesium sulfate bolus IVPB       LOS: 4 days    Time spent: 25 mins     Wyvonnia Dusky, MD [facility] Pager [phone]  If 7PM-7AM, please contact night-coverage [email or web address] 08/18/2021, 7:33 AM

## 2021-08-18 NOTE — Progress Notes (Signed)
[facility]  PROGRESS NOTE   Subjective:   Patient seen resting in bed, alert and oriented Denies pain or discomfort Tolerating meals without nausea and vomiting Remains on room air No lower extremity edema  Creatinine 3.21 Calcium 11.7  Objective:  Vital signs: Blood pressure 128/76, pulse 87, temperature 98.3 F (36.8 C), temperature source Oral, resp. rate 20, height 6' (1.829 m), weight 108 kg, SpO2 100 %.  Intake/Output Summary (Last 24 hours) at 08/18/2021 1400 Last data filed at 08/17/2021 1900 Gross per 24 hour  Intake 480 ml  Output --  Net 480 ml    Filed Weights   08/14/21 0858  Weight: 108 kg     Physical Exam: General:  No acute distress  Head:  Normocephalic, atraumatic. Moist oral mucosal membranes  Eyes:  Anicteric  Lungs:   Clear to auscultation, normal effort  Heart:  S1S2 no rubs  Abdomen:   Soft, nontender, bowel sounds present  Extremities: Trace peripheral edema.  Neurologic:  Awake, alert, following commands  Skin:  No lesions  Access: None    Basic Metabolic Panel: Recent Labs  Lab 08/14/21 0941 08/14/21 1247 08/15/21 0219 08/15/21 0847 08/15/21 2023 08/16/21 0216 08/16/21 0854 08/17/21 0338 08/18/21 0426  NA 127*   < > 128*   < > 127* 130* 129* 134* 130*  K 3.4*   < > 3.3*   < > 3.5 3.7 3.9 3.8 4.2  CL 95*   < > 101   < > 102 105 104 108 106  CO2 23   < > 23   < > _0 GLUCOSE 77   < > 78   < > 113* 87 105* 101* 88  BUN 73*   < > 68*   < > 56* 53* 46* 37* 30*  CREATININE 6.60*   < > 5.12*   < > 3.79* 3.58* 3.27* 3.03* 3.21*  CALCIUM 14.3*   < > 11.1*   < > 11.2* 11.4* 12.2* 11.9* 11.7*  MG 1.6*  --  1.8  --   --   --   --  1.4* 1.4*  PHOS >30.0*  --  3.9  --   --   --   --  3.6 4.6   < > = values in this interval not displayed.     CBC: Recent Labs  Lab 08/14/21 0941 08/15/21 0219 08/15/21 1552 08/16/21 0216 08/17/21 0338 08/18/21 0426  WBC 10.2 8.5 7.8 7.3 9.5 10.2  NEUTROABS 7.3  --   4.2  --   --   --   HGB 11.2* 10.5* 10.1* 10.0* 9.4* 9.5*  HCT 33.0* 30.3* 29.6* 30.1* 27.9* 28.6*  MCV 78.9* 78.1* 78.3* 79.4* 79.5* 80.1  PLT 206 194 199 198 207 214      Urinalysis: No results for input(s): "COLORURINE", "LABSPEC", "PHURINE", "GLUCOSEU", "HGBUR", "BILIRUBINUR", "KETONESUR", "PROTEINUR", "UROBILINOGEN", "NITRITE", "LEUKOCYTESUR" in the last 72 hours.  Invalid input(s): "APPERANCEUR"    Imaging: No results found.   Medications:    sodium chloride 75 mL/hr at 08/17/21 2230    allopurinol  50 mg Oral Daily   vitamin B-12  1,000 mcg Oral Daily   folic acid  1 mg Oral Daily   heparin  5,000 Units Subcutaneous Q8H   multivitamin with minerals  1 tablet Oral Daily   nicotine  21 mg Transdermal Daily   senna-docusate  1 tablet Oral BID   thiamine  100 mg Oral Daily  Or   thiamine  100 mg Intravenous Daily    Assessment/ Plan:     Principal Problem:   AKI (acute kidney injury) ([employer]) Active Problems:   Electrolyte disturbance   Hypercalcemia   Hypokalemia   Hyponatremia   Microcytic anemia   Smoking   Alcohol use   Sciatica   Abnormal LFTs   Hypomagnesemia   Hyperphosphatemia   Fatigue   B12 deficiency   Bone lesion   Hyperuricemia  Mr. Warden Buffa is a 36 y.o.  male with past medical history of alcohol and tobacco use, anxiety, and sciatica, who was admitted to [facility] on 08/14/2021 for Hypercalcemia [E83.52] Electrolyte disturbance [E87.8] Abnormal laboratory test result [R89.9] AKI (acute kidney injury) ([place]) [N17.9] Fatigue, unspecified type [R53.83]   #1: Acute kidney injury: Patient with acute kidney injury most likely secondary to metastatic myeloma with a component of prerenal azotemia.  Serologies and a bone marrow biopsy report are still pending.  Creatinine continues to fluctuate.  Patient reports good urine output.  We will continue to monitor.   #2: Hypercalcemia: Hypercalcemia most likely secondary to myeloma.  Patient  was given pamidronate on Saturday.  This will slowly continue to decrease calcium levels.  Much improved since admission.   #3: Hyponatremia: Levels have improved also since admission.  Continue IV fluids.  #4: Hyperuricemia: Receiving allopurinol at 50 mg daily. G6PD pending  #5: Hyper phosphatemia: Phosphate within normal limits.  #6: Hypomagnesemia: Remains decreased, supplementation ordered by primary team.     LOS: [facility] 8/14/20232:00 PM

## 2021-08-19 DIAGNOSIS — C9000 Multiple myeloma not having achieved remission: Secondary | ICD-10-CM

## 2021-08-19 DIAGNOSIS — E790 Hyperuricemia without signs of inflammatory arthritis and tophaceous disease: Secondary | ICD-10-CM | POA: Diagnosis not present

## 2021-08-19 DIAGNOSIS — C9001 Multiple myeloma in remission: Secondary | ICD-10-CM | POA: Diagnosis present

## 2021-08-19 DIAGNOSIS — N179 Acute kidney failure, unspecified: Secondary | ICD-10-CM | POA: Diagnosis not present

## 2021-08-19 LAB — MULTIPLE MYELOMA PANEL, SERUM
Albumin SerPl Elph-Mcnc: 3.5 g/dL (ref 2.9–4.4)
Albumin/Glob SerPl: 0.5 — ABNORMAL LOW (ref 0.7–1.7)
Alpha 1: 0.3 g/dL (ref 0.0–0.4)
Alpha2 Glob SerPl Elph-Mcnc: 0.9 g/dL (ref 0.4–1.0)
B-Globulin SerPl Elph-Mcnc: 1.1 g/dL (ref 0.7–1.3)
Gamma Glob SerPl Elph-Mcnc: 5.7 g/dL — ABNORMAL HIGH (ref 0.4–1.8)
Globulin, Total: 8 g/dL — ABNORMAL HIGH (ref 2.2–3.9)
IgA: 64 mg/dL — ABNORMAL LOW (ref 90–386)
IgG (Immunoglobin G), Serum: 7695 mg/dL — ABNORMAL HIGH (ref 603–1613)
IgM (Immunoglobulin M), Srm: 21 mg/dL (ref 20–172)
M Protein SerPl Elph-Mcnc: 5.5 g/dL — ABNORMAL HIGH
Total Protein ELP: 11.5 g/dL — ABNORMAL HIGH (ref 6.0–8.5)

## 2021-08-19 LAB — BASIC METABOLIC PANEL
Anion gap: 3 — ABNORMAL LOW (ref 5–15)
BUN: 26 mg/dL — ABNORMAL HIGH (ref 6–20)
CO2: 21 mmol/L — ABNORMAL LOW (ref 22–32)
Calcium: 10.7 mg/dL — ABNORMAL HIGH (ref 8.9–10.3)
Chloride: 106 mmol/L (ref 98–111)
Creatinine, Ser: 2.91 mg/dL — ABNORMAL HIGH (ref 0.61–1.24)
GFR, Estimated: 28 mL/min — ABNORMAL LOW (ref >60)
Glucose, Bld: 87 mg/dL (ref 70–99)
Potassium: 4 mmol/L (ref 3.5–5.1)
Sodium: 130 mmol/L — ABNORMAL LOW (ref 135–145)

## 2021-08-19 LAB — CBC
HCT: 30.1 % — ABNORMAL LOW (ref 39.0–52.0)
Hemoglobin: 10 g/dL — ABNORMAL LOW (ref 13.0–17.0)
MCH: 26.6 pg (ref 26.0–34.0)
MCHC: 33.2 g/dL (ref 30.0–36.0)
MCV: 80.1 fL (ref 80.0–100.0)
Platelets: 216 10*3/uL (ref 150–400)
RBC: 3.76 MIL/uL — ABNORMAL LOW (ref 4.22–5.81)
RDW: 14.6 % (ref 11.5–15.5)
WBC: 11 10*3/uL — ABNORMAL HIGH (ref 4.0–10.5)
nRBC: 0 % (ref 0.0–0.2)

## 2021-08-19 LAB — PROTEIN ELECTROPHORESIS, SERUM
A/G Ratio: 0.4 — ABNORMAL LOW (ref 0.7–1.7)
Albumin ELP: 3.4 g/dL (ref 2.9–4.4)
Alpha-1-Globulin: 0.3 g/dL (ref 0.0–0.4)
Alpha-2-Globulin: 0.9 g/dL (ref 0.4–1.0)
Beta Globulin: 1.1 g/dL (ref 0.7–1.3)
Gamma Globulin: 5.5 g/dL — ABNORMAL HIGH (ref 0.4–1.8)
Globulin, Total: 7.8 g/dL — ABNORMAL HIGH (ref 2.2–3.9)
M-Spike, %: 5.3 g/dL — ABNORMAL HIGH
Total Protein ELP: 11.2 g/dL — ABNORMAL HIGH (ref 6.0–8.5)

## 2021-08-19 LAB — PHOSPHORUS: Phosphorus: 2.4 mg/dL — ABNORMAL LOW (ref 2.5–4.6)

## 2021-08-19 LAB — SURGICAL PATHOLOGY

## 2021-08-19 LAB — MAGNESIUM: Magnesium: 2.1 mg/dL (ref 1.7–2.4)

## 2021-08-19 MED ORDER — DEXAMETHASONE 4 MG PO TABS
40.0000 mg | ORAL_TABLET | Freq: Once | ORAL | Status: AC
  Administered 2021-08-19: 40 mg via ORAL
  Filled 2021-08-19: qty 10

## 2021-08-19 NOTE — Progress Notes (Signed)
Mobility Specialist - Progress Note   08/19/21 1300  Mobility  Activity Ambulated independently in hallway  Level of Assistance Standby assist, set-up cues, supervision of patient - no hands on  Assistive Device None  Distance Ambulated (ft) 160 ft  Activity Response Tolerated well  $Mobility charge 1 Mobility   Pt ambulated in hallway with supervision -- one self corrected LOB, but notes this is normal at baseline. Pt declines further ambulation and returns to room with needs in reach.  Merrily Brittle Mobility Specialist 08/19/21, 1:04 PM

## 2021-08-19 NOTE — Progress Notes (Signed)
[facility]  PROGRESS NOTE   Subjective:   Patient seen resting in bed, no family at bedside Alert and oriented Reports appetite has improved Denies pain or discomfort  Creatinine 2.97 Calcium 10.7  Objective:  Vital signs: Blood pressure 118/79, pulse 93, temperature 98.7 F (37.1 C), resp. rate 14, height 6' (1.829 m), weight 108 kg, SpO2 100 %.  Intake/Output Summary (Last 24 hours) at 08/19/2021 1334 Last data filed at 08/19/2021 0445 Gross per 24 hour  Intake 2654.21 ml  Output 475 ml  Net 2179.21 ml    Filed Weights   08/14/21 0858  Weight: 108 kg     Physical Exam: General:  No acute distress  Head:  Normocephalic, atraumatic. Moist oral mucosal membranes  Eyes:  Anicteric  Lungs:   Clear to auscultation, normal effort  Heart:  S1S2 no rubs  Abdomen:   Soft, nontender, bowel sounds present  Extremities: Trace peripheral edema.  Neurologic:  Awake, alert, following commands  Skin:  No lesions  Access: None    Basic Metabolic Panel: Recent Labs  Lab 08/14/21 0941 08/14/21 1247 08/15/21 0219 08/15/21 0847 08/16/21 0216 08/16/21 0854 08/17/21 0338 08/18/21 0426 08/19/21 0514  NA 127*   < > 128*   < > 130* 129* 134* 130* 130*  K 3.4*   < > 3.3*   < > 3.7 3.9 3.8 4.2 4.0  CL 95*   < > 101   < > 105 104 108 106 106  CO2 23   < > 23   < > 22 22 26 22  21*  GLUCOSE 77   < > 78   < > 87 105* 101* 88 87  BUN 73*   < > 68*   < > 53* 46* 37* 30* 26*  CREATININE 6.60*   < > 5.12*   < > 3.58* 3.27* 3.03* 3.21* 2.91*  CALCIUM 14.3*   < > 11.1*   < > 11.4* 12.2* 11.9* 11.7* 10.7*  MG 1.6*  --  1.8  --   --   --  1.4* 1.4* 2.1  PHOS >30.0*  --  3.9  --   --   --  3.6 4.6 2.4*   < > = values in this interval not displayed.     CBC: Recent Labs  Lab 08/14/21 0941 08/15/21 0219 08/15/21 1552 08/16/21 0216 08/17/21 0338 08/18/21 0426 08/19/21 0514  WBC 10.2   < > 7.8 7.3 9.5 10.2 11.0*  NEUTROABS 7.3  --  4.2  --   --   --   --   HGB  11.2*   < > 10.1* 10.0* 9.4* 9.5* 10.0*  HCT 33.0*   < > 29.6* 30.1* 27.9* 28.6* 30.1*  MCV 78.9*   < > 78.3* 79.4* 79.5* 80.1 80.1  PLT 206   < > 199 198 207 214 216   < > = values in this interval not displayed.      Urinalysis: No results for input(s): "COLORURINE", "LABSPEC", "PHURINE", "GLUCOSEU", "HGBUR", "BILIRUBINUR", "KETONESUR", "PROTEINUR", "UROBILINOGEN", "NITRITE", "LEUKOCYTESUR" in the last 72 hours.  Invalid input(s): "APPERANCEUR"    Imaging: No results found.   Medications:    sodium chloride 75 mL/hr at 08/19/21 0445    allopurinol  50 mg Oral Daily   vitamin B-12  1,000 mcg Oral Daily   folic acid  1 mg Oral Daily   heparin  5,000 Units Subcutaneous Q8H   multivitamin with minerals  1 tablet Oral Daily  nicotine  21 mg Transdermal Daily   senna-docusate  1 tablet Oral BID   thiamine  100 mg Oral Daily   Or   thiamine  100 mg Intravenous Daily    Assessment/ Plan:     Principal Problem:   AKI (acute kidney injury) ([place]) Active Problems:   Electrolyte disturbance   Hypercalcemia   Hypokalemia   Hyponatremia   Microcytic anemia   Smoking   Alcohol use   Sciatica   Abnormal LFTs   Hypomagnesemia   Hyperphosphatemia   Fatigue   B12 deficiency   Bone lesion   Hyperuricemia  Mr. Valeria Krisko is a 36 y.o.  male with past medical history of alcohol and tobacco use, anxiety, and sciatica, who was admitted to [facility] on 08/14/2021 for Hypercalcemia [E83.52] Electrolyte disturbance [E87.8] Abnormal laboratory test result [R89.9] AKI (acute kidney injury) ([place]) [N17.9] Fatigue, unspecified type [R53.83]   #1: Acute kidney injury: Patient with acute kidney injury most likely secondary to metastatic myeloma with a component of prerenal azotemia.  Serologies and a bone marrow biopsy report are still pending.  Creatinine continues to improve.  Patient continues to report adequate urination.  No acute indication for dialysis at this time.    #2: Hypercalcemia: Hypercalcemia most likely secondary to myeloma.  Patient was given pamidronate on Saturday.  Calcium levels continue to improve.  #3: Hyponatremia: Levels have improved also since admission.  Maintain IV fluids in addition to adequate oral intake.  #4: Hyperuricemia: Continue allopurinol at 50 mg daily. G6PD pending  #5: Hyper phosphatemia: resolved  #6: Hypomagnesemia: Resolved with supplementation     LOS: [facility] 8/15/20231:34 PM

## 2021-08-19 NOTE — Progress Notes (Signed)
PROGRESS NOTE   HPI was taken from Dr. Blaine Hamper: Tanner [name] is a 36 y.o. male with medical history significant of anxiety, smoking, alcohol use, sciatica, who presents with weakness and lethargy.    Pt states he has hx of left sciatica with pain in left buttock and left leg.  Patient was seen here on 8/7 and was started on prednisone 50 mg daily.  Patient states that in the past several days, he feels more sleepy, lethargic with generalized weakness.  When I saw patient in ED, he is alert, oriented x 3.  Patient is not confused currently.  Patient has dry cough, mild shortness breath, no chest pain, fever or chills.  Patient is constipated, denies nausea, vomiting, diarrhea or abdominal pain.  He reports increased urinary frequency, no dysuria or burning on urination.       Data reviewed independently and ED Course: pt was found to have AKI with creatinine 6.60, BUN 73, GFR 11 (baseline creatinine 0.910 01/11/2020), and multiple electrolytes disturbance with hypercalcemia with calcium 14.3, hyponatremia with sodium 127, hypokalemia with potassium 3.4, magnesium 1.6, phosphorus> 30, abnormal liver function (ALP 52, AST 45, ALT 57, total bilirubin 1.3).  Temperature normal, blood pressure 125/73, heart rate 86, RR 16, oxygen saturation 100% on room air.  Chest x-ray negative.  US-renal showed no hydronephrosis and possible mild increase in renal echogenicity could reflect medical renal disease. Patient is admitted to telemetry bed as inpatient.  Dr. Holley Raring of renal is consulted.  As per Dr. Jimmye Norman 8/11-8/15/23: Pt was fount to have hypercalcemia, AKI, anemia, numerous lucencies throughout bony structures that could reflect metastasis or myeloma. There is a strong suspicion of multiple myeloma but currently waiting bone marrow biopsy & SPEP results. Nephro & onco are following.      Tanner [name]  UMP:[id number] DOB: 11/06/1985 DOA: 08/14/2021 PCP: Pcp, No  Assessment & Plan:    Principal Problem:   AKI (acute kidney injury) ([place]) Active Problems:   Electrolyte disturbance   Hypercalcemia   Hypokalemia   Hyponatremia   Hypomagnesemia   Hyperphosphatemia   Microcytic anemia   Smoking   Alcohol use   Abnormal LFTs   Sciatica   Fatigue   B12 deficiency   Bone lesion   Hyperuricemia  Assessment and Plan: AKI: possibly secondary to multiple myeloma. Continue on IVFs. Cr is labile. CT abd/pelvis shows numerous lucencies throughout bony structures could reflect metastasis or myeloma.  SPEP pending. S/p bone marrow biopsy 08/15/21 and still waiting on results. Onco & nephro following and recs apprec    Hypercalcemia: likely secondary to possible MM. Labile.  S/p calcitonin x 1 and pamidronate x 1. Continue on IVFs. Nephro following and recs apprec   Elevated uric acid: started on allopurinol as per onco. G6PD level is pending     Hypokalemia: WNL today       Hyponatremia: labile. Will continue on IVFs   Hypomagnesemia: WNL today     Hyperphosphatemia: encourage po intake    Microcytic anemia: etiology unclear, possibly secondary to MM. Iron is WNL. H&H are labile    Smoking: received smoking cessation counseling. Nicotine patch to prevent w/drawl   Alcohol use: received alcohol cessation counseling. No signs/symptoms of w/drawl currently    Transaminitis: resolved    Sciatica: oxycodone prn. Holding prednisone       DVT prophylaxis: SCDs Code Status: full  Family Communication: discussed pt's care w/ pt's family at bedside and answered their questions Disposition Plan:  likely d/c  back home  Level of care: Med-Surg  Status is: Inpatient Remains inpatient appropriate because: severity of illness, waiting on SPEP & bone marrow biopsy results    Consultants:  Nephro Heme/onco   Procedures:  s/p bone marrow biopsy   Antimicrobials:   Subjective: Pt does not have any complaints   Objective: Vitals:   08/18/21 0531 08/18/21 0819  08/18/21 2130 08/19/21 0527  BP: 116/84 128/76 132/85 127/79  Pulse: 97 87 84 81  Resp: 16 20 18 18   Temp: 99.1 F (37.3 C) 98.3 F (36.8 C) 98.8 F (37.1 C) 98.1 F (36.7 C)  TempSrc: Oral Oral    SpO2: 100% 100% 98% 100%  Weight:      Height:        Intake/Output Summary (Last 24 hours) at 08/19/2021 0744 Last data filed at 08/19/2021 0445 Gross per 24 hour  Intake 2654.21 ml  Output 475 ml  Net 2179.21 ml   Filed Weights   08/14/21 0858  Weight: 108 kg    Examination:  General exam: Appears calm & comfortable  Respiratory system: clear breath sounds b/l  Cardiovascular system: S1 & S2+. No rubs or clicks  Gastrointestinal system: Abd is soft, NT, obese & normal bowel sounds  Central nervous system: alert and oriented. Moves all extremities  Psychiatry: judgement and insight appears normal. Appropriate mood and affect    Data Reviewed: I have personally reviewed following labs and imaging studies  CBC: Recent Labs  Lab 08/14/21 0941 08/15/21 0219 08/15/21 1552 08/16/21 0216 08/17/21 0338 08/18/21 0426 08/19/21 0514  WBC 10.2   < > 7.8 7.3 9.5 10.2 11.0*  NEUTROABS 7.3  --  4.2  --   --   --   --   HGB 11.2*   < > 10.1* 10.0* 9.4* 9.5* 10.0*  HCT 33.0*   < > 29.6* 30.1* 27.9* 28.6* 30.1*  MCV 78.9*   < > 78.3* 79.4* 79.5* 80.1 80.1  PLT 206   < > 199 198 207 214 216   < > = values in this interval not displayed.   Basic Metabolic Panel: Recent Labs  Lab 08/14/21 0941 08/14/21 1247 08/15/21 0219 08/15/21 0847 08/16/21 0216 08/16/21 0854 08/17/21 0338 08/18/21 0426 08/19/21 0514  NA 127*   < > 128*   < > 130* 129* 134* 130* 130*  K 3.4*   < > 3.3*   < > 3.7 3.9 3.8 4.2 4.0  CL 95*   < > 101   < > 105 104 108 106 106  CO2 23   < > 23   < > 22 22 26 22  21*  GLUCOSE 77   < > 78   < > 87 105* 101* 88 87  BUN 73*   < > 68*   < > 53* 46* 37* 30* 26*  CREATININE 6.60*   < > 5.12*   < > 3.58* 3.27* 3.03* 3.21* 2.91*  CALCIUM 14.3*   < > 11.1*   < >  11.4* 12.2* 11.9* 11.7* 10.7*  MG 1.6*  --  1.8  --   --   --  1.4* 1.4* 2.1  PHOS >30.0*  --  3.9  --   --   --  3.6 4.6 2.4*   < > = values in this interval not displayed.   GFR: Estimated Creatinine Clearance: 45.4 mL/min (A) (by C-G formula based on SCr of 2.91 mg/dL (H)). Liver Function Tests: Recent Labs  Lab 08/14/21 0941 08/16/21  0216  AST 45* 34  ALT 57* 42  ALKPHOS 52 46  BILITOT 1.3* 0.8  PROT <3.0* 11.2*  ALBUMIN 2.8* 2.2*   No results for input(s): "LIPASE", "AMYLASE" in the last 168 hours. Recent Labs  Lab 08/14/21 1247  AMMONIA 34   Coagulation Profile: Recent Labs  Lab 08/14/21 1247  INR 1.1   Cardiac Enzymes: Recent Labs  Lab 08/14/21 0941  CKTOTAL 301   BNP (last 3 results) No results for input(s): "PROBNP" in the last 8760 hours. HbA1C: No results for input(s): "HGBA1C" in the last 72 hours. CBG: No results for input(s): "GLUCAP" in the last 168 hours.  Lipid Profile: No results for input(s): "CHOL", "HDL", "LDLCALC", "TRIG", "CHOLHDL", "LDLDIRECT" in the last 72 hours. Thyroid Function Tests: No results for input(s): "TSH", "T4TOTAL", "FREET4", "T3FREE", "THYROIDAB" in the last 72 hours. Anemia Panel: No results for input(s): "VITAMINB12", "FOLATE", "FERRITIN", "TIBC", "IRON", "RETICCTPCT" in the last 72 hours.  Sepsis Labs: No results for input(s): "PROCALCITON", "LATICACIDVEN" in the last 168 hours.  Recent Results (from the past 240 hour(s))  Resp Panel by RT-PCR (Flu A&B, Covid) Anterior Nasal Swab     Status: None   Collection Time: 08/11/21  2:39 PM   Specimen: Anterior Nasal Swab  Result Value Ref Range Status   SARS Coronavirus 2 by RT PCR NEGATIVE NEGATIVE Final    Comment: (NOTE) SARS-CoV-2 target nucleic acids are NOT DETECTED.  The SARS-CoV-2 RNA is generally detectable in upper respiratory specimens during the acute phase of infection. The lowest concentration of SARS-CoV-2 viral copies this assay can detect is 138  copies/mL. A negative result does not preclude SARS-Cov-2 infection and should not be used as the sole basis for treatment or other patient management decisions. A negative result may occur with  improper specimen collection/handling, submission of specimen other than nasopharyngeal swab, presence of viral mutation(s) within the areas targeted by this assay, and inadequate number of viral copies(<138 copies/mL). A negative result must be combined with clinical observations, patient history, and epidemiological information. The expected result is Negative.  Fact Sheet for Patients:  EntrepreneurPulse.com.au  Fact Sheet for Healthcare Providers:  IncredibleEmployment.be  This test is no t yet approved or cleared by the [place] FDA and  has been authorized for detection and/or diagnosis of SARS-CoV-2 by FDA under an Emergency Use Authorization (EUA). This EUA will remain  in effect (meaning this test can be used) for the duration of the COVID-19 declaration under Section 564(b)(1) of the Act, 21 U.S.C.section 360bbb-3(b)(1), unless the authorization is terminated  or revoked sooner.       Influenza A by PCR NEGATIVE NEGATIVE Final   Influenza B by PCR NEGATIVE NEGATIVE Final    Comment: (NOTE) The Xpert Xpress SARS-CoV-2/FLU/RSV plus assay is intended as an aid in the diagnosis of influenza from Nasopharyngeal swab specimens and should not be used as a sole basis for treatment. Nasal washings and aspirates are unacceptable for Xpert Xpress SARS-CoV-2/FLU/RSV testing.  Fact Sheet for Patients: EntrepreneurPulse.com.au  Fact Sheet for Healthcare Providers: IncredibleEmployment.be  This test is not yet approved or cleared by the [place] FDA and has been authorized for detection and/or diagnosis of SARS-CoV-2 by FDA under an Emergency Use Authorization (EUA). This EUA will remain in effect (meaning  this test can be used) for the duration of the COVID-19 declaration under Section 564(b)(1) of the Act, 21 U.S.C. section 360bbb-3(b)(1), unless the authorization is terminated or revoked.  Performed at [facility], [phone]  [address], [place], [place] [address]   Urine Culture     Status: Abnormal   Collection Time: 08/11/21  4:17 PM   Specimen: Urine, Random  Result Value Ref Range Status   Specimen Description   Final    URINE, RANDOM Performed at [facility], [address], [place], [place] [address]    Special Requests   Final    NONE Performed at [facility], [address] [place], [place] [address]    Culture (A)  Final    <10,000 COLONIES/mL INSIGNIFICANT GROWTH Performed at [facility], [address] [place], [place] [address]    Report Status 08/12/2021 FINAL  Final         Radiology Studies: No results found.      Scheduled Meds:  allopurinol  50 mg Oral Daily   vitamin B-12  1,000 mcg Oral Daily   folic acid  1 mg Oral Daily   heparin  5,000 Units Subcutaneous Q8H   multivitamin with minerals  1 tablet Oral Daily   nicotine  21 mg Transdermal Daily   senna-docusate  1 tablet Oral BID   thiamine  100 mg Oral Daily   Or   thiamine  100 mg Intravenous Daily   Continuous Infusions:  sodium chloride 75 mL/hr at 08/19/21 0445     LOS: 5 days    Time spent: 25 mins     Wyvonnia Dusky, MD [facility] Pager [phone]  If 7PM-7AM, please contact night-coverage [email or web address] 08/19/2021, 7:44 AM

## 2021-08-19 NOTE — Progress Notes (Signed)
Hematology/Oncology Progress note [phone] [phone]     Patient Care Team: Pcp, No as PCP - General   Name of the patient: Tanner [name]  [id number]  11-27-1985  Date of visit: 08/19/21   INTERVAL HISTORY-   Multiple myeloma panel showed IgG level of 7695, M protein 5.5, immunofixation showed IgG monoclonal protein with lambda light chain specificity.  Free lambda chain 1007,kappa /lambda ratio 0,01.  UPEP is pending. Bone marrow biopsy results are pending.  Peripheral blood flow cytometry showed no significant immunophenotypic abnormality detected.  Monocytes show partial aberrant expression of CD56 a finding is not specific.  Mother was at the bedside.  No Known Allergies  Patient Active Problem List   Diagnosis Date Noted   Hyperuricemia    Fatigue    B12 deficiency    Bone lesion    Electrolyte disturbance 08/14/2021   Hypercalcemia 08/14/2021   Hypokalemia 08/14/2021   Hyponatremia 08/14/2021   Microcytic anemia 08/14/2021   Smoking 08/14/2021   Alcohol use 08/14/2021   Sciatica 08/14/2021   Abnormal LFTs 08/14/2021   AKI (acute kidney injury) ([place]) 08/14/2021   Hypomagnesemia 08/14/2021   Hyperphosphatemia 08/14/2021     Past Medical History:  Diagnosis Date   Alcohol use    Sciatica    Smoking      Past Surgical History:  Procedure Laterality Date   INGUINAL HERNIA REPAIR      Social History   Socioeconomic History   Marital status: Single    Spouse name: Not on file   Number of children: Not on file   Years of education: Not on file   Highest education level: Not on file  Occupational History   Not on file  Tobacco Use   Smoking status: Every Day    Types: E-cigarettes   Smokeless tobacco: Never  Vaping Use   Vaping Use: Every day  Substance and Sexual Activity   Alcohol use: Yes    Alcohol/week: 24.0 standard drinks of alcohol    Types: 24 Cans of beer per week   Drug use: Never   Sexual activity:  Not on file  Other Topics Concern   Not on file  Social History Narrative   Not on file   Social Determinants of Health   Financial Resource Strain: Not on file  Food Insecurity: Not on file  Transportation Needs: Not on file  Physical Activity: Not on file  Stress: Not on file  Social Connections: Not on file  Intimate Partner Violence: Not on file     Family History  Problem Relation Age of Onset   Breast cancer Mother    Alcoholism Father      Current Facility-Administered Medications:    0.9 %  sodium chloride infusion, , Intravenous, Continuous, Ivor Costa, MD, Last Rate: 75 mL/hr at 08/19/21 0445, Infusion Verify at 08/19/21 0445   acetaminophen (TYLENOL) tablet 650 mg, 650 mg, Oral, Q6H PRN, Wyvonnia Dusky, MD, 650 mg at 08/18/21 2004   allopurinol (ZYLOPRIM) tablet 50 mg, 50 mg, Oral, Daily, [name], MD, 50 mg at 08/19/21 0946   cyanocobalamin (VITAMIN B12) tablet 1,000 mcg, 1,000 mcg, Oral, Daily, [name], MD, 1,000 mcg at 92/42/68 3419   folic acid (FOLVITE) tablet 1 mg, 1 mg, Oral, Daily, Ivor Costa, MD, 1 mg at 08/19/21 0947   heparin injection 5,000 Units, 5,000 Units, Subcutaneous, Q8H, Ivor Costa, MD, 5,000 Units at 08/19/21 1428   morphine (PF) 2 MG/ML injection 1 mg, 1 mg,  Intravenous, Q4H PRN, Wyvonnia Dusky, MD   multivitamin with minerals tablet 1 tablet, 1 tablet, Oral, Daily, Ivor Costa, MD, 1 tablet at 08/19/21 0947   nicotine (NICODERM CQ - dosed in mg/24 hours) patch 21 mg, 21 mg, Transdermal, Daily, Ivor Costa, MD   ondansetron [facility]) injection 4 mg, 4 mg, Intravenous, Q8H PRN, Ivor Costa, MD   oxyCODONE (Oxy IR/ROXICODONE) immediate release tablet 5 mg, 5 mg, Oral, Q6H PRN, Ivor Costa, MD   oxyCODONE-acetaminophen (PERCOCET/ROXICET) 5-325 MG per tablet 1 tablet, 1 tablet, Oral, Q6H PRN, Wyvonnia Dusky, MD   polyethylene glycol (MIRALAX / GLYCOLAX) packet 17 g, 17 g, Oral, Daily PRN, Ivor Costa, MD   senna-docusate (Senokot-S) tablet  1 tablet, 1 tablet, Oral, BID, Ivor Costa, MD, 1 tablet at 08/18/21 2028   thiamine (VITAMIN B1) tablet 100 mg, 100 mg, Oral, Daily, 100 mg at 08/19/21 0947 **OR** thiamine (VITAMIN B1) injection 100 mg, 100 mg, Intravenous, Daily, Ivor Costa, MD   Physical exam:  Vitals:   08/18/21 2130 08/19/21 0527 08/19/21 0935 08/19/21 1646  BP: 132/85 127/79 118/79 128/71  Pulse: 84 81 93 87  Resp: 18 18 14 18   Temp: 98.8 F (37.1 C) 98.1 F (36.7 C) 98.7 F (37.1 C) 98.8 F (37.1 C)  TempSrc:      SpO2: 98% 100% 100% 100%  Weight:      Height:       Physical Exam Constitutional:      General: He is not in acute distress.    Appearance: He is not diaphoretic.  HENT:     Head: Normocephalic.     Mouth/Throat:     Pharynx: No oropharyngeal exudate.  Cardiovascular:     Rate and Rhythm: Normal rate.     Heart sounds: No murmur heard. Pulmonary:     Effort: Pulmonary effort is normal. No respiratory distress.  Abdominal:     Palpations: Abdomen is soft.  Musculoskeletal:        General: Normal range of motion.     Cervical back: Normal range of motion and neck supple.  Skin:    General: Skin is warm.     Findings: No erythema.  Neurological:     Mental Status: He is alert and oriented to person, place, and time.     Cranial Nerves: No cranial nerve deficit.     Motor: No abnormal muscle tone.  Psychiatric:        Mood and Affect: Affect normal.      Labs    Latest Ref Rng & Units 08/19/2021    5:14 AM 08/18/2021    4:26 AM 08/17/2021    3:38 AM  CBC  WBC 4.0 - 10.5 K/uL 11.0  10.2  9.5   Hemoglobin 13.0 - 17.0 g/dL 10.0  9.5  9.4   Hematocrit 39.0 - 52.0 % 30.1  28.6  27.9   Platelets 150 - 400 K/uL 216  214  207       Latest Ref Rng & Units 08/19/2021    5:14 AM 08/18/2021    4:26 AM 08/17/2021    3:38 AM  CMP  Glucose 70 - 99 mg/dL 87  88  101   BUN 6 - 20 mg/dL 26  30  37   Creatinine 0.61 - 1.24 mg/dL 2.91  3.21  3.03   Sodium 135 - 145 mmol/L 130  130  134    Potassium 3.5 - 5.1 mmol/L 4.0  4.2  3.8  Chloride 98 - 111 mmol/L 106  106  108   CO2 22 - 32 mmol/L 21  22  26    Calcium 8.9 - 10.3 mg/dL 10.7  11.7  11.9        RADIOGRAPHIC STUDIES: I have personally reviewed the radiological images as listed and agreed with the findings in the report. CT BONE MARROW BIOPSY & ASPIRATION  Result Date: 08/15/2021 INDICATION: Concern for multiple myeloma EXAM: CT BONE MARROW BIOPSY AND ASPIRATION MEDICATIONS: None. ANESTHESIA/SEDATION: Moderate (conscious) sedation was employed during this procedure. A total of Versed 1 mg and Fentanyl 100 mcg was administered intravenously. Moderate Sedation Time: 15 minutes. The patient's level of consciousness and vital signs were monitored continuously by radiology nursing throughout the procedure under my direct supervision. FLUOROSCOPY TIME:  N/a COMPLICATIONS: None immediate. PROCEDURE: Informed written consent was obtained from the patient after a thorough discussion of the procedural risks, benefits and alternatives. All questions were addressed. Maximal Sterile Barrier Technique was utilized including caps, mask, sterile gowns, sterile gloves, sterile drape, hand hygiene and skin antiseptic. A timeout was performed prior to the initiation of the procedure. The patient was placed prone on the CT exam table. Limited CT of the pelvis was performed for planning purposes. Skin entry site was marked, and the overlying skin was prepped and draped in the standard sterile fashion. Local analgesia was obtained with 1% lidocaine. Using CT guidance, an 11 gauge needle was advanced just deep to the cortex of the right posterior ilium. Subsequently, bone marrow aspiration and core biopsy were performed. Specimens were submitted to lab/pathology for handling. Hemostasis was achieved with manual pressure, and a clean dressing was placed. The patient tolerated the procedure well without immediate complication. IMPRESSION: Successful  CT-guided bone marrow aspiration core biopsy of the right posterior ilium. Electronically Signed   By: Albin Felling M.D.   On: 08/15/2021 13:36   CT CHEST ABDOMEN PELVIS WO CONTRAST  Result Date: 08/14/2021 CLINICAL DATA:  Weakness, lethargy.  Evaluate for occult malignancy. EXAM: CT CHEST, ABDOMEN AND PELVIS WITHOUT CONTRAST TECHNIQUE: Multidetector CT imaging of the chest, abdomen and pelvis was performed following the standard protocol without IV contrast. RADIATION DOSE REDUCTION: This exam was performed according to the departmental dose-optimization program which includes automated exposure control, adjustment of the mA and/or kV according to patient size and/or use of iterative reconstruction technique. COMPARISON:  None Available. FINDINGS: CT CHEST FINDINGS Cardiovascular: Heart is normal size. Aorta is normal caliber. Mediastinum/Nodes: No mediastinal, hilar, or axillary adenopathy. Trachea and esophagus are unremarkable. Thyroid unremarkable. Lungs/Pleura: Lungs are clear. No focal airspace opacities or suspicious nodules. No effusions. Musculoskeletal: Chest wall soft tissues are unremarkable. Mild appearance with areas of lucency throughout the thoracic spine, sternum and ribs. This can be seen with metastases or myeloma. CT ABDOMEN PELVIS FINDINGS Hepatobiliary: No focal hepatic abnormality. Gallbladder unremarkable. Pancreas: No focal abnormality or ductal dilatation. Spleen: No focal abnormality.  Normal size. Adrenals/Urinary Tract: No adrenal abnormality. No focal renal abnormality. No stones or hydronephrosis. Urinary bladder is unremarkable. Stomach/Bowel: Sigmoid diverticulosis. No active diverticulitis. Stomach and small bowel decompressed, unremarkable. Normal appendix. Vascular/Lymphatic: No evidence of aneurysm or adenopathy. Reproductive: No visible focal abnormality. Other: No free fluid or free air. Musculoskeletal: Lucencies noted throughout the lumbar spine and bony pelvis, best  seen in the left iliac bone. There is cortical destruction seen in the left iliac bone. This could be seen with metastasis or myeloma. IMPRESSION: Numerous lucencies throughout the bony structures, most pronounced throughout the  spine and in the left iliac bone. This could reflect metastases or myeloma. Otherwise no acute findings in the chest, abdomen or pelvis. Sigmoid diverticulosis. Electronically Signed   By: Rolm Baptise M.D.   On: 08/14/2021 18:24   US RENAL  Result Date: 08/14/2021 CLINICAL DATA:  Acute kidney injury EXAM: RENAL / URINARY TRACT ULTRASOUND COMPLETE COMPARISON:  None Available. FINDINGS: Right Kidney: Renal measurements: 12.4 x 6.2 x 7.2 cm = volume: 292 mL. Echogenicity may be mildly increased. No mass or hydronephrosis visualized. Left Kidney: Renal measurements: 12.2 x 6.6 x 5.8 cm = volume: 244 mL. Echogenicity may be mildly increased. No mass or hydronephrosis visualized. Bladder: Appears normal for degree of bladder distention. Other: None. IMPRESSION: No hydronephrosis. Possible mild increase in renal echogenicity could reflect medical renal disease. Electronically Signed   By: Macy Mis M.D.   On: 08/14/2021 12:17   DG Chest 2 View  Result Date: 08/14/2021 CLINICAL DATA:  Short of breath EXAM: CHEST - 2 VIEW COMPARISON:  01/11/2020 FINDINGS: The heart size and mediastinal contours are within normal limits. Both lungs are clear. The visualized skeletal structures are unremarkable. IMPRESSION: No active cardiopulmonary disease. Electronically Signed   By: Franchot Gallo M.D.   On: 08/14/2021 10:38    Assessment and plan-   #Acute kidney failure, hypercalcemia is likely due to multiple myeloma Multiple myeloma panel and light chain ratio results were reviewed with patient and mother. Bone marrow biopsy results are pending. Lengthy discussion with patient and her mother today regarding the clinical diagnosis of multiple myeloma,  nature of the disease and the potential  treatment plan.  Recommend patient to start on dexamethasone 40 mg x 1  We will check with pharmacy to see if we are able to give Velcade during this hospitalization. Continue IV fluid hydration.  Hypercalcemia level has improved to 10.7.  Creatinine level has improved to 2.91. Patient has ongoing dental issue.  I will hold off bisphosphonate.  Recommend patient to get outpatient dental clearance. Hyperuricemia uric acid level has also improved to 8.7.  I will hold off rasburicase.  Continue allopurinol hyperuricemia is likely secondary to acute kidney failure.  I do not think this is due to tumor lysis.  Avoid nephrotoxins. outpatient I will arrange patient to have PET scan done, he needs to follow-up with me for outpatient myeloma treatments.   Thank you for allowing me to participate in the care of this patient.   [name], MD, PhD Hematology Oncology 08/19/2021

## 2021-08-20 ENCOUNTER — Other Ambulatory Visit: Payer: Self-pay | Admitting: Oncology

## 2021-08-20 ENCOUNTER — Telehealth: Payer: Self-pay | Admitting: Pharmacist

## 2021-08-20 ENCOUNTER — Telehealth: Payer: Self-pay

## 2021-08-20 ENCOUNTER — Telehealth: Payer: Self-pay | Admitting: Pharmacy Technician

## 2021-08-20 ENCOUNTER — Other Ambulatory Visit (HOSPITAL_COMMUNITY): Payer: Self-pay

## 2021-08-20 ENCOUNTER — Encounter: Payer: Self-pay | Admitting: Oncology

## 2021-08-20 ENCOUNTER — Other Ambulatory Visit: Payer: Self-pay

## 2021-08-20 DIAGNOSIS — C9000 Multiple myeloma not having achieved remission: Secondary | ICD-10-CM

## 2021-08-20 DIAGNOSIS — E669 Obesity, unspecified: Secondary | ICD-10-CM | POA: Diagnosis not present

## 2021-08-20 DIAGNOSIS — E878 Other disorders of electrolyte and fluid balance, not elsewhere classified: Secondary | ICD-10-CM | POA: Diagnosis not present

## 2021-08-20 DIAGNOSIS — N179 Acute kidney failure, unspecified: Secondary | ICD-10-CM | POA: Diagnosis not present

## 2021-08-20 DIAGNOSIS — Z5111 Encounter for antineoplastic chemotherapy: Secondary | ICD-10-CM | POA: Diagnosis not present

## 2021-08-20 LAB — CBC
HCT: 29.3 % — ABNORMAL LOW (ref 39.0–52.0)
Hemoglobin: 9.8 g/dL — ABNORMAL LOW (ref 13.0–17.0)
MCH: 26.6 pg (ref 26.0–34.0)
MCHC: 33.4 g/dL (ref 30.0–36.0)
MCV: 79.4 fL — ABNORMAL LOW (ref 80.0–100.0)
Platelets: 237 10*3/uL (ref 150–400)
RBC: 3.69 MIL/uL — ABNORMAL LOW (ref 4.22–5.81)
RDW: 14.7 % (ref 11.5–15.5)
WBC: 12.8 10*3/uL — ABNORMAL HIGH (ref 4.0–10.5)
nRBC: 0 % (ref 0.0–0.2)

## 2021-08-20 LAB — BASIC METABOLIC PANEL
Anion gap: 0 — ABNORMAL LOW (ref 5–15)
BUN: 32 mg/dL — ABNORMAL HIGH (ref 6–20)
CO2: 20 mmol/L — ABNORMAL LOW (ref 22–32)
Calcium: 10 mg/dL (ref 8.9–10.3)
Chloride: 108 mmol/L (ref 98–111)
Creatinine, Ser: 2.6 mg/dL — ABNORMAL HIGH (ref 0.61–1.24)
GFR, Estimated: 32 mL/min — ABNORMAL LOW (ref >60)
Glucose, Bld: 164 mg/dL — ABNORMAL HIGH (ref 70–99)
Potassium: 5.1 mmol/L (ref 3.5–5.1)
Sodium: 128 mmol/L — ABNORMAL LOW (ref 135–145)

## 2021-08-20 LAB — GLUCOSE 6 PHOSPHATE DEHYDROGENASE
G6PDH: 9.2 U/g{Hb} (ref 3.8–14.2)
Hemoglobin: 10.9 g/dL — ABNORMAL LOW (ref 13.0–17.7)

## 2021-08-20 LAB — PHOSPHORUS: Phosphorus: 3.6 mg/dL (ref 2.5–4.6)

## 2021-08-20 LAB — MAGNESIUM: Magnesium: 1.8 mg/dL (ref 1.7–2.4)

## 2021-08-20 MED ORDER — SODIUM CHLORIDE 0.9 % IV SOLN: Freq: Once | INTRAVENOUS | Status: DC | PRN

## 2021-08-20 MED ORDER — BORTEZOMIB CHEMO SQ INJECTION 3.5 MG (2.5MG/ML)
1.3000 mg/m2 | Freq: Once | INTRAMUSCULAR | Status: AC
  Administered 2021-08-20: 3 mg via SUBCUTANEOUS
  Filled 2021-08-20: qty 1.2

## 2021-08-20 MED ORDER — ACETAMINOPHEN 325 MG PO TABS
650.0000 mg | ORAL_TABLET | Freq: Once | ORAL | Status: AC
  Administered 2021-08-20: 650 mg via ORAL
  Filled 2021-08-20: qty 2

## 2021-08-20 MED ORDER — FAMOTIDINE IN NACL 20-0.9 MG/50ML-% IV SOLN: 20.0000 mg | Freq: Once | INTRAVENOUS | Status: DC | PRN

## 2021-08-20 MED ORDER — EPINEPHRINE 0.3 MG/0.3ML IJ SOAJ: 0.3000 mg | Freq: Once | INTRAMUSCULAR | Status: DC | PRN

## 2021-08-20 MED ORDER — ALBUTEROL SULFATE HFA 108 (90 BASE) MCG/ACT IN AERS: 2.0000 | INHALATION_SPRAY | Freq: Once | RESPIRATORY_TRACT | Status: DC | PRN

## 2021-08-20 MED ORDER — SODIUM CHLORIDE 0.9 % IV SOLN
20.0000 mg | Freq: Once | INTRAVENOUS | Status: AC
  Administered 2021-08-20: 20 mg via INTRAVENOUS
  Filled 2021-08-20: qty 2

## 2021-08-20 MED ORDER — SODIUM CHLORIDE 0.9% FLUSH: 10.0000 mL | INTRAVENOUS | Status: DC | PRN

## 2021-08-20 MED ORDER — DIPHENHYDRAMINE HCL 25 MG PO CAPS
50.0000 mg | ORAL_CAPSULE | Freq: Once | ORAL | Status: AC
  Administered 2021-08-20: 50 mg via ORAL
  Filled 2021-08-20: qty 2

## 2021-08-20 MED ORDER — DIPHENHYDRAMINE HCL 50 MG/ML IJ SOLN: 50.0000 mg | Freq: Once | INTRAMUSCULAR | Status: DC | PRN

## 2021-08-20 MED ORDER — HEPARIN SOD (PORK) LOCK FLUSH 100 UNIT/ML IV SOLN: 250.0000 [IU] | Freq: Once | INTRAVENOUS | Status: DC | PRN

## 2021-08-20 MED ORDER — HEPARIN SOD (PORK) LOCK FLUSH 100 UNIT/ML IV SOLN: 500.0000 [IU] | Freq: Once | INTRAVENOUS | Status: DC | PRN

## 2021-08-20 MED ORDER — ALTEPLASE 2 MG IJ SOLR: 2.0000 mg | Freq: Once | INTRAMUSCULAR | Status: DC | PRN

## 2021-08-20 MED ORDER — SODIUM CHLORIDE 0.9 % IV SOLN: Freq: Once | INTRAVENOUS | Status: DC

## 2021-08-20 MED ORDER — METHYLPREDNISOLONE SODIUM SUCC 125 MG IJ SOLR: 125.0000 mg | Freq: Once | INTRAMUSCULAR | Status: DC | PRN

## 2021-08-20 MED ORDER — SODIUM CHLORIDE 0.9% FLUSH: 3.0000 mL | INTRAVENOUS | Status: DC | PRN

## 2021-08-20 NOTE — Assessment & Plan Note (Signed)
Meets criteria for BMI greater than 30 

## 2021-08-20 NOTE — Progress Notes (Addendum)
Hematology/Oncology Progress note [phone] [phone]) [phone]     Patient Care Team: Pcp, No as PCP - General   Name of the patient: Tanner [name]  [id number]  [birth year]  Date of visit: 08/20/21   INTERVAL HISTORY-   Multiple myeloma panel showed IgG level of 7695, M protein 5.5, immunofixation showed IgG monoclonal protein with lambda light chain specificity.  Free lambda chain 1007,kappa /lambda ratio 0,01.  UPEP is pending. Bone marrow biopsy results are pending.  Peripheral blood flow cytometry showed no significant immunophenotypic abnormality detected.  Monocytes show partial aberrant expression of CD56 a finding is not specific.  Bone marrow biopsy on 08/15/21 came back today, positive for plasma cell myeloma involving greater than 80% of the marrow.  Cytogenetics and molecular testings are pending.  Beta-2 microglobulin level is pending.  Mother is at the bedside.  Patient denies any new complaints.  He had dexamethasone 40 mg on 08/19/2021.  Appetite is good. No Known Allergies  Patient Active Problem List   Diagnosis Date Noted   Obesity (BMI 30-39.9) 08/20/2021   Multiple myeloma not having achieved remission (HCC)    Hyperuricemia    Fatigue    B12 deficiency    Bone lesion    Electrolyte disturbance 08/14/2021   Hypercalcemia 08/14/2021   Hypokalemia 08/14/2021   Hyponatremia 08/14/2021   Microcytic anemia 08/14/2021   Smoking 08/14/2021   Alcohol use 08/14/2021   Sciatica 08/14/2021   Abnormal LFTs 08/14/2021   AKI (acute kidney injury) ([place]) 08/14/2021   Hypomagnesemia 08/14/2021   Hyperphosphatemia 08/14/2021     Past Medical History:  Diagnosis Date   Alcohol use    Sciatica    Smoking      Past Surgical History:  Procedure Laterality Date   INGUINAL HERNIA REPAIR      Social History   Socioeconomic History   Marital status: Single    Spouse name: Not on file   Number of children: Not on file   Years of  education: Not on file   Highest education level: Not on file  Occupational History   Not on file  Tobacco Use   Smoking status: Every Day    Types: E-cigarettes   Smokeless tobacco: Never  Vaping Use   Vaping Use: Every day  Substance and Sexual Activity   Alcohol use: Yes    Alcohol/week: 24.0 standard drinks of alcohol    Types: 24 Cans of beer per week   Drug use: Never   Sexual activity: Not on file  Other Topics Concern   Not on file  Social History Narrative   Not on file   Social Determinants of Health   Financial Resource Strain: Not on file  Food Insecurity: Not on file  Transportation Needs: Not on file  Physical Activity: Not on file  Stress: Not on file  Social Connections: Not on file  Intimate Partner Violence: Not on file     Family History  Problem Relation Age of Onset   Breast cancer Mother    Alcoholism Father      Current Facility-Administered Medications:    0.9 %  sodium chloride infusion, , Intravenous, Continuous, Annita Brod, MD, Last Rate: 150 mL/hr at 08/20/21 1512, Infusion Verify at 08/20/21 1512   0.9 %  sodium chloride infusion, , Intravenous, Once, [name], MD   0.9 %  sodium chloride infusion, , Intravenous, Once PRN, [name], MD   acetaminophen (TYLENOL) tablet 650 mg, 650 mg, Oral, Q6H PRN,  Wyvonnia Dusky, MD, 650 mg at 08/19/21 2038   albuterol (VENTOLIN HFA) 108 (90 Base) MCG/ACT inhaler 2 puff, 2 puff, Inhalation, Once PRN, [name], MD   allopurinol (ZYLOPRIM) tablet 50 mg, 50 mg, Oral, Daily, [name], MD, 50 mg at 08/20/21 0839   alteplase (CATHFLO ACTIVASE) injection 2 mg, 2 mg, Intracatheter, Once PRN, [name], MD   cyanocobalamin (VITAMIN B12) tablet 1,000 mcg, 1,000 mcg, Oral, Daily, [name], MD, 1,000 mcg at 08/20/21 0840   diphenhydrAMINE (BENADRYL) injection 50 mg, 50 mg, Intravenous, Once PRN, [name], MD   EPINEPHrine (EPI-PEN) injection 0.3 mg, 0.3 mg, Intramuscular, Once PRN, [name], MD    famotidine (PEPCID) IVPB 20 mg premix, 20 mg, Intravenous, Once PRN, [name], MD   folic acid (FOLVITE) tablet 1 mg, 1 mg, Oral, Daily, Ivor Costa, MD, 1 mg at 08/20/21 0839   heparin lock flush 100 unit/mL, 500 Units, Intracatheter, Once PRN, [name], MD   heparin lock flush 100 unit/mL, 250 Units, Intracatheter, Once PRN, [name], MD   methylPREDNISolone sodium succinate (SOLU-MEDROL) 125 mg/2 mL injection 125 mg, 125 mg, Intravenous, Once PRN, [name], MD   morphine (PF) 2 MG/ML injection 1 mg, 1 mg, Intravenous, Q4H PRN, Wyvonnia Dusky, MD   multivitamin with minerals tablet 1 tablet, 1 tablet, Oral, Daily, Ivor Costa, MD, 1 tablet at 08/20/21 1002   nicotine (NICODERM CQ - dosed in mg/24 hours) patch 21 mg, 21 mg, Transdermal, Daily, Ivor Costa, MD   ondansetron (ZOFRAN) injection 4 mg, 4 mg, Intravenous, Q8H PRN, Ivor Costa, MD   oxyCODONE (Oxy IR/ROXICODONE) immediate release tablet 5 mg, 5 mg, Oral, Q6H PRN, Ivor Costa, MD   oxyCODONE-acetaminophen (PERCOCET/ROXICET) 5-325 MG per tablet 1 tablet, 1 tablet, Oral, Q6H PRN, Wyvonnia Dusky, MD   polyethylene glycol (MIRALAX / GLYCOLAX) packet 17 g, 17 g, Oral, Daily PRN, Ivor Costa, MD   senna-docusate (Senokot-S) tablet 1 tablet, 1 tablet, Oral, BID, Ivor Costa, MD, 1 tablet at 08/18/21 2028   sodium chloride flush (NS) 0.9 % injection 10 mL, 10 mL, Intracatheter, PRN, [name], MD   sodium chloride flush (NS) 0.9 % injection 3 mL, 3 mL, Intracatheter, PRN, [name], MD   thiamine (VITAMIN B1) tablet 100 mg, 100 mg, Oral, Daily, 100 mg at 08/20/21 0839 **OR** thiamine (VITAMIN B1) injection 100 mg, 100 mg, Intravenous, Daily, Ivor Costa, MD   Physical exam:  Vitals:   08/19/21 2010 08/20/21 0505 08/20/21 0757 08/20/21 1634  BP: 135/77 120/85 108/66 132/76  Pulse: 92 76 80 93  Resp: _0 (!) 25  Temp: 98.6 F (37 C) (!) 97.4 F (36.3 C) 98.1 F (36.7 C) 97.9 F (36.6 C)  TempSrc:   Oral   SpO2: 100% 100% 100% 100%   Weight:      Height:       Physical Exam Constitutional:      General: He is not in acute distress.    Appearance: He is not diaphoretic.  HENT:     Head: Normocephalic.     Mouth/Throat:     Pharynx: No oropharyngeal exudate.  Cardiovascular:     Rate and Rhythm: Normal rate.     Heart sounds: No murmur heard. Pulmonary:     Effort: Pulmonary effort is normal. No respiratory distress.  Abdominal:     Palpations: Abdomen is soft.  Musculoskeletal:        General: Normal range of motion.  Cervical back: Normal range of motion and neck supple.  Skin:    Findings: No erythema.  Neurological:     Mental Status: He is alert and oriented to person, place, and time.     Cranial Nerves: No cranial nerve deficit.     Motor: No abnormal muscle tone.  Psychiatric:        Mood and Affect: Affect normal.      Labs    Latest Ref Rng & Units 08/20/2021    4:51 AM 08/19/2021    5:14 AM 08/18/2021    4:26 AM  CBC  WBC 4.0 - 10.5 K/uL 12.8  11.0  10.2   Hemoglobin 13.0 - 17.0 g/dL 9.8  10.0  9.5   Hematocrit 39.0 - 52.0 % 29.3  30.1  28.6   Platelets 150 - 400 K/uL 237  216  214       Latest Ref Rng & Units 08/20/2021    4:51 AM 08/19/2021    5:14 AM 08/18/2021    4:26 AM  CMP  Glucose 70 - 99 mg/dL 164  87  88   BUN 6 - 20 mg/dL 32  26  30   Creatinine 0.61 - 1.24 mg/dL 2.60  2.91  3.21   Sodium 135 - 145 mmol/L 128  130  130   Potassium 3.5 - 5.1 mmol/L 5.1  4.0  4.2   Chloride 98 - 111 mmol/L 108  106  106   CO2 22 - 32 mmol/L _0 Calcium 8.9 - 10.3 mg/dL 10.0  10.7  11.7        RADIOGRAPHIC STUDIES: I have personally reviewed the radiological images as listed and agreed with the findings in the report. CT BONE MARROW BIOPSY & ASPIRATION  Result Date: 08/15/2021 INDICATION: Concern for multiple myeloma EXAM: CT BONE MARROW BIOPSY AND ASPIRATION MEDICATIONS: None. ANESTHESIA/SEDATION: Moderate (conscious) sedation was employed during this procedure. A total  of Versed 1 mg and Fentanyl 100 mcg was administered intravenously. Moderate Sedation Time: 15 minutes. The patient's level of consciousness and vital signs were monitored continuously by radiology nursing throughout the procedure under my direct supervision. FLUOROSCOPY TIME:  N/a COMPLICATIONS: None immediate. PROCEDURE: Informed written consent was obtained from the patient after a thorough discussion of the procedural risks, benefits and alternatives. All questions were addressed. Maximal Sterile Barrier Technique was utilized including caps, mask, sterile gowns, sterile gloves, sterile drape, hand hygiene and skin antiseptic. A timeout was performed prior to the initiation of the procedure. The patient was placed prone on the CT exam table. Limited CT of the pelvis was performed for planning purposes. Skin entry site was marked, and the overlying skin was prepped and draped in the standard sterile fashion. Local analgesia was obtained with 1% lidocaine. Using CT guidance, an 11 gauge needle was advanced just deep to the cortex of the right posterior ilium. Subsequently, bone marrow aspiration and core biopsy were performed. Specimens were submitted to lab/pathology for handling. Hemostasis was achieved with manual pressure, and a clean dressing was placed. The patient tolerated the procedure well without immediate complication. IMPRESSION: Successful CT-guided bone marrow aspiration core biopsy of the right posterior ilium. Electronically Signed   By: Albin Felling M.D.   On: 08/15/2021 13:36   CT CHEST ABDOMEN PELVIS WO CONTRAST  Result Date: 08/14/2021 CLINICAL DATA:  Weakness, lethargy.  Evaluate for occult malignancy. EXAM: CT CHEST, ABDOMEN AND PELVIS WITHOUT CONTRAST TECHNIQUE: Multidetector CT imaging of the  chest, abdomen and pelvis was performed following the standard protocol without IV contrast. RADIATION DOSE REDUCTION: This exam was performed according to the departmental dose-optimization  program which includes automated exposure control, adjustment of the mA and/or kV according to patient size and/or use of iterative reconstruction technique. COMPARISON:  None Available. FINDINGS: CT CHEST FINDINGS Cardiovascular: Heart is normal size. Aorta is normal caliber. Mediastinum/Nodes: No mediastinal, hilar, or axillary adenopathy. Trachea and esophagus are unremarkable. Thyroid unremarkable. Lungs/Pleura: Lungs are clear. No focal airspace opacities or suspicious nodules. No effusions. Musculoskeletal: Chest wall soft tissues are unremarkable. Mild appearance with areas of lucency throughout the thoracic spine, sternum and ribs. This can be seen with metastases or myeloma. CT ABDOMEN PELVIS FINDINGS Hepatobiliary: No focal hepatic abnormality. Gallbladder unremarkable. Pancreas: No focal abnormality or ductal dilatation. Spleen: No focal abnormality.  Normal size. Adrenals/Urinary Tract: No adrenal abnormality. No focal renal abnormality. No stones or hydronephrosis. Urinary bladder is unremarkable. Stomach/Bowel: Sigmoid diverticulosis. No active diverticulitis. Stomach and small bowel decompressed, unremarkable. Normal appendix. Vascular/Lymphatic: No evidence of aneurysm or adenopathy. Reproductive: No visible focal abnormality. Other: No free fluid or free air. Musculoskeletal: Lucencies noted throughout the lumbar spine and bony pelvis, best seen in the left iliac bone. There is cortical destruction seen in the left iliac bone. This could be seen with metastasis or myeloma. IMPRESSION: Numerous lucencies throughout the bony structures, most pronounced throughout the spine and in the left iliac bone. This could reflect metastases or myeloma. Otherwise no acute findings in the chest, abdomen or pelvis. Sigmoid diverticulosis. Electronically Signed   By: Rolm Baptise M.D.   On: 08/14/2021 18:24   US RENAL  Result Date: 08/14/2021 CLINICAL DATA:  Acute kidney injury EXAM: RENAL / URINARY TRACT  ULTRASOUND COMPLETE COMPARISON:  None Available. FINDINGS: Right Kidney: Renal measurements: 12.4 x 6.2 x 7.2 cm = volume: 292 mL. Echogenicity may be mildly increased. No mass or hydronephrosis visualized. Left Kidney: Renal measurements: 12.2 x 6.6 x 5.8 cm = volume: 244 mL. Echogenicity may be mildly increased. No mass or hydronephrosis visualized. Bladder: Appears normal for degree of bladder distention. Other: None. IMPRESSION: No hydronephrosis. Possible mild increase in renal echogenicity could reflect medical renal disease. Electronically Signed   By: Macy Mis M.D.   On: 08/14/2021 12:17   DG Chest 2 View  Result Date: 08/14/2021 CLINICAL DATA:  Short of breath EXAM: CHEST - 2 VIEW COMPARISON:  01/11/2020 FINDINGS: The heart size and mediastinal contours are within normal limits. Both lungs are clear. The visualized skeletal structures are unremarkable. IMPRESSION: No active cardiopulmonary disease. Electronically Signed   By: Franchot Gallo M.D.   On: 08/14/2021 10:38    Assessment and plan-   #IgG lambda multiple myeloma M protein 5.5, free lambda light chain 1007, kappa lambda light chain ratio 0.01 Bone marrow biopsy showed > 80% plasma cells. Diagnosis of multiple myeloma was discussed in details with the patient and his mother. I recommend inpatient Velcade 1.3 mg/m2 to be given during this hospitalization due to high disease burden.  He will also receive dexamethasone 20 mg today. Rationale and potential side effects were reviewed in details with patient and mother and they agree with the plan. Communicated with pharmacy and the patient will receive cycle 1 day 1 Velcade this afternoon. If patient is discharged this week, our office will contact patient for his outpatient appointment, tentatively planned on 08/25/2021.  Acute kidney failure, creatinine continues to improve.  Today creatinine 2.6. Avoid nephrotoxins  Continue  IV fluid hydration.  Managed by nephrology and  hospitalist.  Hypercalcemia, improved to 10 today Hyperuricemia, uric acid improved to 8.7.  Continue allopurinol 50 mg daily.   Bone lytic lesions, I plan to arrange outpatient PET scan whole body for further evaluation.  Advised patient to avoid heavy lifting.  He has ongoing dental issue, I will hold off any bisphosphonate at this point until he obtains dental clearance.  Dr. Cecille Aver and Beckey Rutter will be covering patient's case  08/21/21-08/24/2021. thank you for allowing me to participate in the care of this patient.   [name], MD, PhD Hematology Oncology 08/20/2021

## 2021-08-20 NOTE — Telephone Encounter (Signed)
Per Dr. Tasia Catchings secure chat:   Yu team, assuming he is discharged this week, please schedule patient get lab MD Daratumumab (subQ) Velcade Dex *NEW* on 8/22, and velcade only on 8/25. I am working on the integrating scheduling orders.  also please arrange him to get PET WHOLE BODY - multiple myeloma.   Pt will need chemo class prior to start date.   Per Dr. Tasia Catchings patient is aware of plan for PET and starting tx. Please contact pt with appts.

## 2021-08-20 NOTE — Progress Notes (Signed)
START ON PATHWAY REGIMEN - Multiple Myeloma and Other Plasma Cell Dyscrasias   DaraVRd (Daratumumab IV + Bortezomib SUBQ + Lenalidomide PO + Dexamethasone IV/PO) q21 Days (Induction Schema):   A cycle is every 21 days:     Lenalidomide      Dexamethasone      Bortezomib      Daratumumab    DaraVRd (Daratumumab IV + Bortezomib SUBQ + Lenalidomide PO + Dexamethasone IV/PO) q21 Days (Consolidation Schema):   A cycle is every 21 days:     Lenalidomide      Dexamethasone      Bortezomib      Daratumumab   **Always confirm dose/schedule in your pharmacy ordering system**  Patient Characteristics: Multiple Myeloma, Newly Diagnosed, Transplant Eligible, Unknown or Awaiting Test Results Disease Classification: Multiple Myeloma R-ISS Staging: II Therapeutic Status: Newly Diagnosed Is Patient Eligible for Transplant<= Transplant Eligible Risk Status: Awaiting Test Results Intent of Therapy: Non-Curative / Palliative Intent, Discussed with Patient 

## 2021-08-20 NOTE — Assessment & Plan Note (Addendum)
Pathology surgical pathology confirms plasma cell myeloma.  Patient started on Velcade per oncology with next treatment planned for 8/24 and labs on 8/21.  Patient started on allopurinol.  Oncology will arrange for outpatient PET scan.

## 2021-08-20 NOTE — Telephone Encounter (Addendum)
Oral Oncology Pharmacist Encounter  Received new prescription for Revlimid (lenalidomide) for the treatment of newly diagnosed lambda IgG multiple myeloma in conjunction with daratumumab, bortezomib, and dexamethasone, planned duration until disease control or unacceptable drug toxicity.  BMP from 08/20/21 assessed, SCr elevated likely due to multiple myeloma. With current CrCl ~ 50, patient would need to start at a reduced dose if renal function does not improve prior to initiation. Prescription dose and frequency assessed.   Current medication list in Epic reviewed, no DDIs with lenalidomide identified. Based on current medication list, should be reevaluated prior to lenalidomide initiation.    Evaluated chart and no patient barriers to medication adherence identified.   Prescription will be e-scribed to [facility] for benefits analysis and approval.  Oral Oncology Clinic will continue to follow for insurance authorization, copayment issues, initial counseling and start date.   Darl Pikes, PharmD, BCPS, BCOP, CPP Hematology/Oncology Clinical Pharmacist Practitioner [place]/DB/AP [facility] [phone]  08/20/2021 10:35 AM

## 2021-08-20 NOTE — Progress Notes (Signed)
[facility]  PROGRESS NOTE   Subjective:   Patient seen resting in bed, no family at bedside States he feels better since starting on steroids. Reports appetite has improved since steroids Denies pain or discomfort  Creatinine 2.60 Calcium 10.0  Objective:  Vital signs: Blood pressure 108/66, pulse 80, temperature 98.1 F (36.7 C), temperature source Oral, resp. rate 18, height 6' (1.829 m), weight 108 kg, SpO2 100 %.  Intake/Output Summary (Last 24 hours) at 08/20/2021 1306 Last data filed at 08/20/2021 1100 Gross per 24 hour  Intake 240 ml  Output --  Net 240 ml    Filed Weights   08/14/21 0858  Weight: 108 kg     Physical Exam: General:  No acute distress  Head:  Normocephalic, atraumatic. Moist oral mucosal membranes  Eyes:  Anicteric  Lungs:   Clear to auscultation, normal effort  Heart:  S1S2 no rubs  Abdomen:   Soft, nontender, bowel sounds present  Extremities: No peripheral edema.  Neurologic:  Awake, alert, following commands  Skin:  No lesions  Access: None    Basic Metabolic Panel: Recent Labs  Lab 08/15/21 0219 08/15/21 0847 08/16/21 0854 08/17/21 0338 08/18/21 0426 08/19/21 0514 08/20/21 0451  NA 128*   < > 129* 134* 130* 130* 128*  K 3.3*   < > 3.9 3.8 4.2 4.0 5.1  CL 101   < > 104 108 106 106 108  CO2 23   < > 22 26 22  21* 20*  GLUCOSE 78   < > 105* 101* 88 87 164*  BUN 68*   < > 46* 37* 30* 26* 32*  CREATININE 5.12*   < > 3.27* 3.03* 3.21* 2.91* 2.60*  CALCIUM 11.1*   < > 12.2* 11.9* 11.7* 10.7* 10.0  MG 1.8  --   --  1.4* 1.4* 2.1 1.8  PHOS 3.9  --   --  3.6 4.6 2.4* 3.6   < > = values in this interval not displayed.     CBC: Recent Labs  Lab 08/14/21 0941 08/15/21 0219 08/15/21 1552 08/16/21 0216 08/17/21 0338 08/18/21 0426 08/19/21 0514 08/20/21 0451  WBC 10.2   < > 7.8 7.3 9.5 10.2 11.0* 12.8*  NEUTROABS 7.3  --  4.2  --   --   --   --   --   HGB 11.2*   < > 10.1* 10.0* 9.4* 9.5* 10.0* 9.8*  HCT 33.0*    < > 29.6* 30.1* 27.9* 28.6* 30.1* 29.3*  MCV 78.9*   < > 78.3* 79.4* 79.5* 80.1 80.1 79.4*  PLT 206   < > 199 198 207 214 216 237   < > = values in this interval not displayed.      Urinalysis: No results for input(s): "COLORURINE", "LABSPEC", "PHURINE", "GLUCOSEU", "HGBUR", "BILIRUBINUR", "KETONESUR", "PROTEINUR", "UROBILINOGEN", "NITRITE", "LEUKOCYTESUR" in the last 72 hours.  Invalid input(s): "APPERANCEUR"    Imaging: No results found.   Medications:    sodium chloride 150 mL/hr at 08/20/21 1045    allopurinol  50 mg Oral Daily   vitamin B-12  1,000 mcg Oral Daily   folic acid  1 mg Oral Daily   multivitamin with minerals  1 tablet Oral Daily   nicotine  21 mg Transdermal Daily   senna-docusate  1 tablet Oral BID   thiamine  100 mg Oral Daily   Or   thiamine  100 mg Intravenous Daily    Assessment/ Plan:     Principal Problem:  AKI (acute kidney injury) ([place]) Active Problems:   Electrolyte disturbance   Hypercalcemia   Hypokalemia   Hyponatremia   Microcytic anemia   Smoking   Alcohol use   Sciatica   Abnormal LFTs   Hypomagnesemia   Hyperphosphatemia   Fatigue   B12 deficiency   Bone lesion   Hyperuricemia   Multiple myeloma not having achieved remission [facility])  Tanner [name] is a 36 y.o.  male with past medical history of alcohol and tobacco use, anxiety, and sciatica, who was admitted to [facility] on 08/14/2021 for Hypercalcemia [E83.52] Electrolyte disturbance [E87.8] Abnormal laboratory test result [R89.9] AKI (acute kidney injury) ([place]) [N17.9] Fatigue, unspecified type [R53.83]   #1: Acute kidney injury: Patient with acute kidney injury most likely secondary to metastatic myeloma with a component of prerenal azotemia.  Serologies and a bone marrow biopsy report are still pending.  Preliminary bone marrow results in favor of MM.  Awaiting finalized report today or tomorrow.  Renal function continues to improve.  Continue to avoid  nephrotoxic agents and therapies.   #2: Hypercalcemia: Hypercalcemia most likely secondary to myeloma.  Patient was given pamidronate on Saturday.  Calcium within normal range.  #3: Hyponatremia: Sodium levels continue to fluctuate however improved.  We will continue IV fluids at this time.  #4: Hyperuricemia: Continue allopurinol at 50 mg daily.  Oncology holding rasburicase for now.  #5: Hyper phosphatemia: resolved  #6: Hypomagnesemia: Resolved     LOS: [facility] 8/16/20231:06 PM

## 2021-08-20 NOTE — Progress Notes (Signed)
Per Dr [place] Pink ok to discontinue heparin injections, pt up in room ambulating independently

## 2021-08-20 NOTE — Progress Notes (Signed)
[facility] Progress Note  Patient: Tanner [name]    WHQ:[id number]  DOA: 08/14/2021    Date of Service: the patient was seen and examined on 08/20/2021  Brief hospital course: 36 year old male with past medical history of obesity, tobacco and alcohol use and sciatica presented to the emergency room on 8/11 with lethargy and weakness and found to have acute kidney injury with a creatinine of 6.6 and electrolyte disturbances including hypercalcemia, hyper phosphatemia and hypokalemia.  Work-up revealed numerous lucencies throughout bony structures concerning for multiple myeloma and patient underwent bone marrow biopsy with results currently pending.  Nephrology and oncology following.  Electrolyte abnormalities were corrected and renal function has been slowly improving.  Assessment and Plan: Assessment and Plan: * AKI (acute kidney injury) ([place]) Multifactorial including significant dehydration caused by hypercalcemia/use of NSAIDs as well as multiple myeloma in itself.  Renal ultrasound is negative for hydronephrosis.  Nephrology following.  Patient has been responding to IV fluids and creatinine has been slowly improving.  Currently at 2.6.  Have increased IV fluid rate.   Multiple myeloma not having achieved remission [facility]) Being followed by oncology.  Pathology surgical pathology confirms plasma cell myeloma.  Treatment plan has been crafted.  Electrolyte disturbance Patient has multiple electrolytes disturbance.  Etiology is not clear. -See below  Hypercalcemia Secondary to increased bone turnover from multiple myeloma.  Has received calcitonin and now on aggressive IV hydration.   Hypokalemia K 3.4 -will not replete potassium due to severe AKI  Hyponatremia Sodium currently 128.  This may be in part due to alcohol abuse.  Continue to monitor closely.    Hypomagnesemia Replaced as needed   Hyperphosphatemia Phosphorus> 30.    Microcytic anemia Hemoglobin 11.2  (13.9 01/11/2020) -Anemia panel  Smoking Patient counseled.  He is no longer interested in smoking.  #.  Alcohol use -Did counseling about importance of quitting alcohol use -CIWA protocol  Abnormal LFTs Patient has mild abnormal liver function, likely due to alcohol abuse.  After several days, normalized.  Hepatitis panel and HIV negative.  Sciatica - As needed oxycodone -Hold prednisone  Obesity (BMI 30-39.9) Meets criteria for BMI greater than 30       Body mass index is 32.29 kg/m.        Consultants: Oncology Nephrology  Procedures: Status post bone marrow biopsy  Antimicrobials: None  Code Status: Full code   Subjective: Patient with no complaints, does not feel anxious  Objective: Vital signs were reviewed and unremarkable. Vitals:   08/20/21 0505 08/20/21 0757  BP: 120/85 108/66  Pulse: 76 80  Resp: 18 18  Temp: (!) 97.4 F (36.3 C) 98.1 F (36.7 C)  SpO2: 100% 100%    Intake/Output Summary (Last 24 hours) at 08/20/2021 1430 Last data filed at 08/20/2021 1100 Gross per 24 hour  Intake 240 ml  Output --  Net 240 ml   Filed Weights   08/14/21 0858  Weight: 108 kg   Body mass index is 32.29 kg/m.  Exam:  General: Alert and oriented x3, no acute distress HEENT: Normocephalic and atraumatic, mucous membranes are moist Cardiovascular: Regular rate and rhythm, S1-S2 Respiratory: Clear to auscultation bilaterally Abdomen: Soft, nontender, nondistended, positive bowel sounds Musculoskeletal: No clubbing or cyanosis or edema Skin: Skin breaks, tears or lesions of the Psychiatry: Great, no evidence of psychoses Neurology: No focal deficits  Data Reviewed: Creatinine down to 2.6.  Sodium at 128.  Disposition:  Status is: Inpatient Remains inpatient appropriate because: Continued need for  IV fluids and improvement in renal function    Anticipated discharge date: 8/18  Family Communication: Left message for patient's mother DVT  Prophylaxis: Place and maintain sequential compression device Start: 08/15/21 1410    Author: Annita Brod ,MD 08/20/2021 2:30 PM  To reach On-call, see care teams to locate the attending and reach out via [email or web address]. Between 7PM-7AM, please contact night-coverage If you still have difficulty reaching the attending provider, please page the [facility] (Director on Call) for [facility] on amion for assistance.

## 2021-08-20 NOTE — Telephone Encounter (Signed)
Oral Oncology Patient Advocate Encounter   Received notification that prior authorization for Lenalidomide is required.   PA submitted on 08/20/2021 Key [id number]  Status is pending     Lady Deutscher, CPhT-Adv Pharmacy Patient Advocate Specialist [place] Patient Advocate Team Direct Number: [phone]  Fax: [phone]

## 2021-08-20 NOTE — Hospital Course (Addendum)
36 year old male with past medical history of obesity, tobacco and alcohol use and sciatica presented to the emergency room on 8/11 with lethargy and weakness and found to have acute kidney injury with a creatinine of 6.6 and electrolyte disturbances including hypercalcemia, hyper phosphatemia and hypokalemia.  Work-up revealed numerous lucencies throughout bony structures concerning for multiple myeloma.  Diagnosis was confirmed from bone marrow biopsy and patient seen by oncology and started on chemotherapy.  Electrolyte abnormalities were corrected and with aggressive fluid resuscitation, renal function continued to improve and by day of discharge down to 1.74 with GFR 52.

## 2021-08-20 NOTE — Telephone Encounter (Signed)
Oral Oncology Patient Advocate Encounter  Prior Authorization for Lenalidomide (Revlimid) has been approved.    PA# [id number] Effective dates: 08/20/2021 through 08/20/2022  Patient must fill at [facility].    Lady Deutscher, CPhT-Adv Pharmacy Patient Advocate Specialist [place] Patient Advocate Team Direct Number: [phone]  Fax: [phone]

## 2021-08-21 ENCOUNTER — Telehealth: Payer: Self-pay

## 2021-08-21 DIAGNOSIS — N179 Acute kidney failure, unspecified: Secondary | ICD-10-CM | POA: Diagnosis not present

## 2021-08-21 DIAGNOSIS — C9000 Multiple myeloma not having achieved remission: Secondary | ICD-10-CM | POA: Diagnosis not present

## 2021-08-21 DIAGNOSIS — E669 Obesity, unspecified: Secondary | ICD-10-CM | POA: Diagnosis not present

## 2021-08-21 LAB — UPEP/TP, 24-HR URINE
Albumin, U: 4.1 %
Alpha 1, Urine: 0.1 %
Alpha 2, Urine: 0.6 %
Beta, Urine: 0.7 %
Gamma Globulin, Urine: 94.6 %
M-Spike, mg/24 hr: 1255.3 mg/24 hr — ABNORMAL HIGH
M-spike, %: 82.9 % — ABNORMAL HIGH
Total Protein, Urine-Ur/day: 1514 mg/24 hr — ABNORMAL HIGH (ref 30–150)
Total Protein, Urine: 67.3 mg/dL
Total Volume: 2250

## 2021-08-21 LAB — BASIC METABOLIC PANEL
Anion gap: 1 — ABNORMAL LOW (ref 5–15)
BUN: 37 mg/dL — ABNORMAL HIGH (ref 6–20)
CO2: 19 mmol/L — ABNORMAL LOW (ref 22–32)
Calcium: 8.6 mg/dL — ABNORMAL LOW (ref 8.9–10.3)
Chloride: 108 mmol/L (ref 98–111)
Creatinine, Ser: 1.97 mg/dL — ABNORMAL HIGH (ref 0.61–1.24)
GFR, Estimated: 45 mL/min — ABNORMAL LOW (ref >60)
Glucose, Bld: 126 mg/dL — ABNORMAL HIGH (ref 70–99)
Potassium: 4 mmol/L (ref 3.5–5.1)
Sodium: 128 mmol/L — ABNORMAL LOW (ref 135–145)

## 2021-08-21 LAB — PTH-RELATED PEPTIDE: PTH-related peptide: <2 pmol/L

## 2021-08-21 LAB — URIC ACID: Uric Acid, Serum: 7.3 mg/dL (ref 3.7–8.6)

## 2021-08-21 LAB — BETA 2 MICROGLOBULIN, SERUM: Beta-2 Microglobulin: 3.6 mg/L — ABNORMAL HIGH (ref 0.6–2.4)

## 2021-08-21 MED ORDER — SODIUM CHLORIDE 0.9 % IV SOLN: INTRAVENOUS | Status: DC

## 2021-08-21 NOTE — Telephone Encounter (Signed)
Spoke to [place] at [facility] pathology who states that Congo Red staining was ordered on 8/14. Just waiting for pathologist to sign out and then results will be available.

## 2021-08-21 NOTE — Telephone Encounter (Signed)
Per Lamar Sprinkles, pt has been notified of appts.

## 2021-08-21 NOTE — Progress Notes (Signed)
[facility] Progress Note  Patient: Tanner [name]    ULA:[id number]  DOA: 08/14/2021    Date of Service: the patient was seen and examined on 08/21/2021  Brief hospital course: 36 year old male with past medical history of obesity, tobacco and alcohol use and sciatica presented to the emergency room on 8/11 with lethargy and weakness and found to have acute kidney injury with a creatinine of 6.6 and electrolyte disturbances including hypercalcemia, hyper phosphatemia and hypokalemia.  Work-up revealed numerous lucencies throughout bony structures concerning for multiple myeloma and patient underwent bone marrow biopsy with results currently pending.  Nephrology and oncology following.  Electrolyte abnormalities were corrected and renal function has been slowly improving.  Assessment and Plan: Assessment and Plan: * AKI (acute kidney injury) ([place]) Multifactorial including significant dehydration caused by hypercalcemia/use of NSAIDs as well as multiple myeloma in itself.  Renal ultrasound is negative for hydronephrosis.  Nephrology following.  Patient has been responding to IV fluids and creatinine has been slowly improving, even more so after fluid rate increased..  Currently at 1.97.  Anticipate discharge tomorrow as renal function much closer to normal.  Multiple myeloma not having achieved remission [facility])  Pathology surgical pathology confirms plasma cell myeloma.  Patient started on Velcade per oncology with next treatment planned for 8/24 and labs on 8/21.  Electrolyte disturbance Patient has multiple electrolytes disturbance.  Etiology is not clear. -See below  Hypercalcemia Secondary to increased bone turnover from multiple myeloma.  Has received calcitonin and now on aggressive IV hydration.   Hypokalemia Replaced as needed.  Currently stable.  Hyponatremia Sodium currently 128.  This may be in part due to alcohol abuse.  Continue to monitor closely.     Hypomagnesemia Replaced as needed   Hyperphosphatemia Phosphorus> 30.    Microcytic anemia Hemoglobin 11.2 (13.9 01/11/2020) -Anemia panel  Smoking Patient counseled.  He is no longer interested in smoking.  #.  Alcohol use -Did counseling about importance of quitting alcohol use -CIWA protocol  Abnormal LFTs Patient has mild abnormal liver function, likely due to alcohol abuse.  After several days, normalized.  Hepatitis panel and HIV negative.  Sciatica - As needed oxycodone -Hold prednisone  Obesity (BMI 30-39.9) Meets criteria for BMI greater than 30       Body mass index is 32.29 kg/m.        Consultants: Oncology Nephrology  Procedures: Status post bone marrow biopsy  Antimicrobials: None  Code Status: Full code   Subjective: Patient with no complaints, does not feel anxious  Objective: Vital signs were reviewed and unremarkable. Vitals:   08/21/21 0454 08/21/21 0751  BP: (!) 106/56 115/74  Pulse: 86 77  Resp: 19 18  Temp: (!) 97.5 F (36.4 C) 97.9 F (36.6 C)  SpO2: 100% 100%    Intake/Output Summary (Last 24 hours) at 08/21/2021 1516 Last data filed at 08/21/2021 0313 Gross per 24 hour  Intake 1788.56 ml  Output --  Net 2122.56 ml    Filed Weights   08/14/21 0858  Weight: 108 kg   Body mass index is 32.29 kg/m.  Exam:  General: Alert and oriented x3, no acute distress HEENT: Normocephalic and atraumatic, mucous membranes are moist Cardiovascular: Regular rate and rhythm, S1-S2 Respiratory: Clear to auscultation bilaterally Abdomen: Soft, nontender, nondistended, positive bowel sounds Musculoskeletal: No clubbing or cyanosis or edema Skin: Skin breaks, tears or lesions of the Psychiatry: Great, no evidence of psychoses Neurology: No focal deficits  Data Reviewed: Creatinine down to 1.97.  Sodium at 128.  Disposition:  Status is: Inpatient Remains inpatient appropriate because: Continued need for IV fluids and  improvement in renal function    Anticipated discharge date: 8/18  Family Communication: Father at the bedside DVT Prophylaxis: Place and maintain sequential compression device Start: 08/15/21 1410    Author: Annita Brod ,MD 08/21/2021 3:16 PM  To reach On-call, see care teams to locate the attending and reach out via [email or web address]. Between 7PM-7AM, please contact night-coverage If you still have difficulty reaching the attending provider, please page the [facility] (Director on Call) for [facility] on amion for assistance.

## 2021-08-21 NOTE — Progress Notes (Addendum)
[facility]  PROGRESS NOTE   Subjective:   Patient seen sitting at side of bed Appears in good spirits today Tolerating meals without nausea and vomiting Continues to feel well with steroid therapy  Creatinine 1.91 Calcium 8.6  Objective:  Vital signs: Blood pressure 115/74, pulse 77, temperature 97.9 F (36.6 C), temperature source Axillary, resp. rate 18, height 6' (1.829 m), weight 108 kg, SpO2 100 %.  Intake/Output Summary (Last 24 hours) at 08/21/2021 1023 Last data filed at 08/21/2021 0313 Gross per 24 hour  Intake 5077.89 ml  Output --  Net 5077.89 ml    Filed Weights   08/14/21 0858  Weight: 108 kg     Physical Exam: General:  No acute distress  Head:  Normocephalic, atraumatic. Moist oral mucosal membranes  Eyes:  Anicteric  Lungs:   Clear to auscultation, normal effort  Heart:  S1S2 no rubs  Abdomen:   Soft, nontender, bowel sounds present  Extremities: No peripheral edema.  Neurologic:  Awake, alert, following commands  Skin:  No lesions  Access: None    Basic Metabolic Panel: Recent Labs  Lab 08/15/21 0219 08/15/21 0847 08/17/21 0338 08/18/21 0426 08/19/21 0514 08/20/21 0451 08/21/21 0429  NA 128*   < > 134* 130* 130* 128* 128*  K 3.3*   < > 3.8 4.2 4.0 5.1 4.0  CL 101   < > 108 106 106 108 108  CO2 23   < > 26 22 21* 20* 19*  GLUCOSE 78   < > 101* 88 87 164* 126*  BUN 68*   < > 37* 30* 26* 32* 37*  CREATININE 5.12*   < > 3.03* 3.21* 2.91* 2.60* 1.97*  CALCIUM 11.1*   < > 11.9* 11.7* 10.7* 10.0 8.6*  MG 1.8  --  1.4* 1.4* 2.1 1.8  --   PHOS 3.9  --  3.6 4.6 2.4* 3.6  --    < > = values in this interval not displayed.     CBC: Recent Labs  Lab 08/15/21 1552 08/16/21 0216 08/17/21 0338 08/17/21 1412 08/18/21 0426 08/19/21 0514 08/20/21 0451  WBC 7.8 7.3 9.5  --  10.2 11.0* 12.8*  NEUTROABS 4.2  --   --   --   --   --   --   HGB 10.1* 10.0* 9.4* 10.9* 9.5* 10.0* 9.8*  HCT 29.6* 30.1* 27.9*  --  28.6* 30.1* 29.3*   MCV 78.3* 79.4* 79.5*  --  80.1 80.1 79.4*  PLT 199 198 207  --  214 216 237      Urinalysis: No results for input(s): "COLORURINE", "LABSPEC", "PHURINE", "GLUCOSEU", "HGBUR", "BILIRUBINUR", "KETONESUR", "PROTEINUR", "UROBILINOGEN", "NITRITE", "LEUKOCYTESUR" in the last 72 hours.  Invalid input(s): "APPERANCEUR"    Imaging: No results found.   Medications:    sodium chloride     sodium chloride     famotidine (PEPCID) IV (ONCOLOGY)      allopurinol  50 mg Oral Daily   vitamin B-12  1,000 mcg Oral Daily   folic acid  1 mg Oral Daily   multivitamin with minerals  1 tablet Oral Daily   nicotine  21 mg Transdermal Daily   senna-docusate  1 tablet Oral BID   thiamine  100 mg Oral Daily   Or   thiamine  100 mg Intravenous Daily    Assessment/ Plan:     Principal Problem:   AKI (acute kidney injury) ([place]) Active Problems:   Electrolyte disturbance   Hypercalcemia  Hypokalemia   Hyponatremia   Microcytic anemia   Smoking   Alcohol use   Sciatica   Abnormal LFTs   Hypomagnesemia   Hyperphosphatemia   Multiple myeloma not having achieved remission ([facility])   Obesity (BMI 30-39.9)   Encounter for antineoplastic chemotherapy  Mr. Zeric Baranowski is a 36 y.o.  male with past medical history of alcohol and tobacco use, anxiety, and sciatica, who was admitted to [facility] on 08/14/2021 for Hypercalcemia [E83.52] Electrolyte disturbance [E87.8] Abnormal laboratory test result [R89.9] AKI (acute kidney injury) ([place]) [N17.9] Fatigue, unspecified type [R53.83]   #1: Acute kidney injury: Patient with acute kidney injury most likely secondary to metastatic myeloma with a component of prerenal azotemia.   Renal function continues to improve with adequate urine output.  We will stop IV fluids due to adequate oral intake.  Bone marrow biopsy results indicate IgG lambda multiple myeloma with greater than 80% plasma cells.  Treatment with Velcade began yesterday.  We will  follow-up with oncology for further management.  Patient cleared to discharge from renal stance.  We will schedule outpatient follow-up in 1 to 2 weeks at our office to monitor renal function.   #2: Hypercalcemia: Hypercalcemia most likely secondary to myeloma.  Patient was given pamidronate on Saturday.  Calcium now 8.6.  Expected to self-correct.  #3: Hyponatremia: Sodium levels continue to fluctuate however improved.  Sodium 128.  Improved appetite and this should correct with treatments.  #4: Hyperuricemia: Continue allopurinol at 50 mg daily.   #5: Hyper phosphatemia: Corrected  #6: Hypomagnesemia: Resolved     LOS: [facility] 8/17/202310:23 AM

## 2021-08-21 NOTE — Telephone Encounter (Signed)
Per Dr. Tasia Catchings Staff message:   Here is the plan for Tanner [name].  No room on 8/22 for treatment.  Below is the adjustment of his appt.  08/25/21 lab MD Dara-velcade  08/28/21 velcade    I am quite booked on 8/22, but this patient is quite sick and I will given him priority to be seen by me next week.

## 2021-08-22 ENCOUNTER — Encounter: Payer: Self-pay | Admitting: Oncology

## 2021-08-22 ENCOUNTER — Other Ambulatory Visit: Payer: Self-pay

## 2021-08-22 ENCOUNTER — Encounter (HOSPITAL_COMMUNITY): Payer: Self-pay

## 2021-08-22 DIAGNOSIS — E876 Hypokalemia: Secondary | ICD-10-CM | POA: Diagnosis not present

## 2021-08-22 DIAGNOSIS — N179 Acute kidney failure, unspecified: Secondary | ICD-10-CM | POA: Diagnosis not present

## 2021-08-22 DIAGNOSIS — C9000 Multiple myeloma not having achieved remission: Secondary | ICD-10-CM | POA: Diagnosis not present

## 2021-08-22 LAB — BASIC METABOLIC PANEL
Anion gap: 5 (ref 5–15)
BUN: 28 mg/dL — ABNORMAL HIGH (ref 6–20)
CO2: 16 mmol/L — ABNORMAL LOW (ref 22–32)
Calcium: 8.2 mg/dL — ABNORMAL LOW (ref 8.9–10.3)
Chloride: 113 mmol/L — ABNORMAL HIGH (ref 98–111)
Creatinine, Ser: 1.74 mg/dL — ABNORMAL HIGH (ref 0.61–1.24)
GFR, Estimated: 52 mL/min — ABNORMAL LOW (ref >60)
Glucose, Bld: 76 mg/dL (ref 70–99)
Potassium: 3.7 mmol/L (ref 3.5–5.1)
Sodium: 134 mmol/L — ABNORMAL LOW (ref 135–145)

## 2021-08-22 MED ORDER — ALLOPURINOL 100 MG PO TABS: 50.0000 mg | ORAL_TABLET | Freq: Every day | ORAL | 2 refills | Status: DC

## 2021-08-22 MED ORDER — POTASSIUM CHLORIDE CRYS ER 20 MEQ PO TBCR
40.0000 meq | EXTENDED_RELEASE_TABLET | Freq: Once | ORAL | Status: AC
  Administered 2021-08-22: 40 meq via ORAL
  Filled 2021-08-22: qty 2

## 2021-08-22 NOTE — Discharge Summary (Signed)
Physician Discharge Summary   Patient: Tanner [name] MRN: [id number] DOB: 05-10-1985  Admit date:     08/14/2021  Discharge date: 08/22/21  Discharge Physician: Annita Brod   PCP: Pcp, No   Recommendations at discharge:   Patient will follow-up with oncology on 8/21 for lab work.  This will include a basic metabolic panel to ensure renal function continues to improve New medication: Allopurinol 50 mg p.o. daily Oncology will arrange for follow-up PET scan Patient will follow-up with nephrology in the next 1 to 2 weeks.  They will set up this appointment. Patient plans to follow-up with dentistry for dental clearance before bisphosphonates given  Discharge Diagnoses: Principal Problem:   AKI (acute kidney injury) ([place]) Active Problems:   Multiple myeloma not having achieved remission ([facility])   Microcytic anemia   Smoking   Alcohol use   Obesity (BMI 30-39.9)   Encounter for antineoplastic chemotherapy  Resolved Problems:   Hypercalcemia   Hypokalemia   Hyponatremia   Hypomagnesemia   Hyperphosphatemia   Abnormal LFTs   Sciatica  Hospital Course: 36 year old male with past medical history of obesity, tobacco and alcohol use and sciatica presented to the emergency room on 8/11 with lethargy and weakness and found to have acute kidney injury with a creatinine of 6.6 and electrolyte disturbances including hypercalcemia, hyper phosphatemia and hypokalemia.  Work-up revealed numerous lucencies throughout bony structures concerning for multiple myeloma.  Diagnosis was confirmed from bone marrow biopsy and patient seen by oncology and started on chemotherapy.  Electrolyte abnormalities were corrected and with aggressive fluid resuscitation, renal function continued to improve and by day of discharge down to 1.74 with GFR 52.  Assessment and Plan: * AKI (acute kidney injury) ([place])  Multifactorial including significant dehydration caused by hypercalcemia/use of NSAIDs  as well as multiple myeloma in itself.  Renal ultrasound is negative for hydronephrosis.  Nephrology following. With aggressive fluid resuscitation, creatinine continued to improve and by day of discharge down to 1.74 with GFR of 52.  Patient will have repeat labs including basic metabolic panel and renal function checked by oncology on 8/21.  Nephrology plans to follow-up with patient in the next few weeks.   Multiple myeloma not having achieved remission [facility])  Pathology surgical pathology confirms plasma cell myeloma.  Patient started on Velcade per oncology with next treatment planned for 8/24 and labs on 8/21.  Patient started on allopurinol.  Oncology will arrange for outpatient PET scan.  Hypercalcemia-resolved as of 08/22/2021 Secondary to increased bone turnover from multiple myeloma.  Has received calcitonin and aggressive IV hydration.  By day of discharge, creatinine down to 1.74.  Hypokalemia-resolved as of 08/22/2021 Replaced as needed.  Currently stable.  Hyponatremia-resolved as of 08/22/2021 Hypovolemic hyponatremia.  Improved and by day of discharge up to 134  Hypomagnesemia-resolved as of 08/22/2021 Replaced as needed   Hyperphosphatemia-resolved as of 08/22/2021 Phosphorus> 30.    Microcytic anemia Hemoglobin 11.2 (13.9 01/11/2020) -Anemia panel  Smoking Patient counseled.  Offered nicotine patch upon discharge and he declined saying that he has not smoked since he has been in the hospital and has no desire to going forward  Alcohol use -Did counseling about importance of quitting alcohol use -CIWA protocol  Abnormal LFTs-resolved as of 08/22/2021 Patient has mild abnormal liver function, likely due to alcohol abuse.  After several days, normalized.  Hepatitis panel and HIV negative.  Sciatica-resolved as of 08/22/2021 - As needed oxycodone -Hold prednisone  Obesity (BMI 30-39.9) Meets criteria for BMI  greater than 30          Consultants: Oncology Nephrology Interventional radiology   Procedures: Status post bone marrow biopsy  Disposition: Home Diet recommendation:  Discharge Diet Orders (From admission, onward)     Start     Ordered   08/22/21 0000  Diet general        08/22/21 1024           Regular diet DISCHARGE MEDICATION: Allergies as of 08/22/2021   No Known Allergies      Medication List     TAKE these medications    allopurinol 100 MG tablet Commonly known as: ZYLOPRIM Take 0.5 tablets (50 mg total) by mouth daily. Start taking on: August 23, 2021        Follow-up [facility] Follow up on 08/25/2021.   Why: Go for bloodwork               Discharge Exam: Filed Weights   08/14/21 0858  Weight: 108 kg   General: Alert and oriented x3, no acute distress Cardiovascular: Regular rate and rhythm, S1-S2 Lungs: Clear to auscultation bilaterally  Condition at discharge: good  The results of significant diagnostics from this hospitalization (including imaging, microbiology, ancillary and laboratory) are listed below for reference.   Imaging Studies: CT BONE MARROW BIOPSY & ASPIRATION  Result Date: 08/15/2021 INDICATION: Concern for multiple myeloma EXAM: CT BONE MARROW BIOPSY AND ASPIRATION MEDICATIONS: None. ANESTHESIA/SEDATION: Moderate (conscious) sedation was employed during this procedure. A total of Versed 1 mg and Fentanyl 100 mcg was administered intravenously. Moderate Sedation Time: 15 minutes. The patient's level of consciousness and vital signs were monitored continuously by radiology nursing throughout the procedure under my direct supervision. FLUOROSCOPY TIME:  N/a COMPLICATIONS: None immediate. PROCEDURE: Informed written consent was obtained from the patient after a thorough discussion of the procedural risks, benefits and alternatives. All questions were addressed. Maximal Sterile Barrier Technique  was utilized including caps, mask, sterile gowns, sterile gloves, sterile drape, hand hygiene and skin antiseptic. A timeout was performed prior to the initiation of the procedure. The patient was placed prone on the CT exam table. Limited CT of the pelvis was performed for planning purposes. Skin entry site was marked, and the overlying skin was prepped and draped in the standard sterile fashion. Local analgesia was obtained with 1% lidocaine. Using CT guidance, an 11 gauge needle was advanced just deep to the cortex of the right posterior ilium. Subsequently, bone marrow aspiration and core biopsy were performed. Specimens were submitted to lab/pathology for handling. Hemostasis was achieved with manual pressure, and a clean dressing was placed. The patient tolerated the procedure well without immediate complication. IMPRESSION: Successful CT-guided bone marrow aspiration core biopsy of the right posterior ilium. Electronically Signed   By: Albin Felling M.D.   On: 08/15/2021 13:36   CT CHEST ABDOMEN PELVIS WO CONTRAST  Result Date: 08/14/2021 CLINICAL DATA:  Weakness, lethargy.  Evaluate for occult malignancy. EXAM: CT CHEST, ABDOMEN AND PELVIS WITHOUT CONTRAST TECHNIQUE: Multidetector CT imaging of the chest, abdomen and pelvis was performed following the standard protocol without IV contrast. RADIATION DOSE REDUCTION: This exam was performed according to the departmental dose-optimization program which includes automated exposure control, adjustment of the mA and/or kV according to patient size and/or use of iterative reconstruction technique. COMPARISON:  None Available. FINDINGS: CT CHEST FINDINGS Cardiovascular: Heart is normal size. Aorta is normal caliber. Mediastinum/Nodes: No mediastinal, hilar, or axillary  adenopathy. Trachea and esophagus are unremarkable. Thyroid unremarkable. Lungs/Pleura: Lungs are clear. No focal airspace opacities or suspicious nodules. No effusions. Musculoskeletal: Chest  wall soft tissues are unremarkable. Mild appearance with areas of lucency throughout the thoracic spine, sternum and ribs. This can be seen with metastases or myeloma. CT ABDOMEN PELVIS FINDINGS Hepatobiliary: No focal hepatic abnormality. Gallbladder unremarkable. Pancreas: No focal abnormality or ductal dilatation. Spleen: No focal abnormality.  Normal size. Adrenals/Urinary Tract: No adrenal abnormality. No focal renal abnormality. No stones or hydronephrosis. Urinary bladder is unremarkable. Stomach/Bowel: Sigmoid diverticulosis. No active diverticulitis. Stomach and small bowel decompressed, unremarkable. Normal appendix. Vascular/Lymphatic: No evidence of aneurysm or adenopathy. Reproductive: No visible focal abnormality. Other: No free fluid or free air. Musculoskeletal: Lucencies noted throughout the lumbar spine and bony pelvis, best seen in the left iliac bone. There is cortical destruction seen in the left iliac bone. This could be seen with metastasis or myeloma. IMPRESSION: Numerous lucencies throughout the bony structures, most pronounced throughout the spine and in the left iliac bone. This could reflect metastases or myeloma. Otherwise no acute findings in the chest, abdomen or pelvis. Sigmoid diverticulosis. Electronically Signed   By: Rolm Baptise M.D.   On: 08/14/2021 18:24   US RENAL  Result Date: 08/14/2021 CLINICAL DATA:  Acute kidney injury EXAM: RENAL / URINARY TRACT ULTRASOUND COMPLETE COMPARISON:  None Available. FINDINGS: Right Kidney: Renal measurements: 12.4 x 6.2 x 7.2 cm = volume: 292 mL. Echogenicity may be mildly increased. No mass or hydronephrosis visualized. Left Kidney: Renal measurements: 12.2 x 6.6 x 5.8 cm = volume: 244 mL. Echogenicity may be mildly increased. No mass or hydronephrosis visualized. Bladder: Appears normal for degree of bladder distention. Other: None. IMPRESSION: No hydronephrosis. Possible mild increase in renal echogenicity could reflect medical renal  disease. Electronically Signed   By: Macy Mis M.D.   On: 08/14/2021 12:17   DG Chest 2 View  Result Date: 08/14/2021 CLINICAL DATA:  Short of breath EXAM: CHEST - 2 VIEW COMPARISON:  01/11/2020 FINDINGS: The heart size and mediastinal contours are within normal limits. Both lungs are clear. The visualized skeletal structures are unremarkable. IMPRESSION: No active cardiopulmonary disease. Electronically Signed   By: Franchot Gallo M.D.   On: 08/14/2021 10:38    Microbiology: Results for orders placed or performed during the hospital encounter of 08/11/21  Resp Panel by RT-PCR (Flu A&B, Covid) Anterior Nasal Swab     Status: None   Collection Time: 08/11/21  2:39 PM   Specimen: Anterior Nasal Swab  Result Value Ref Range Status   SARS Coronavirus 2 by RT PCR NEGATIVE NEGATIVE Final    Comment: (NOTE) SARS-CoV-2 target nucleic acids are NOT DETECTED.  The SARS-CoV-2 RNA is generally detectable in upper respiratory specimens during the acute phase of infection. The lowest concentration of SARS-CoV-2 viral copies this assay can detect is 138 copies/mL. A negative result does not preclude SARS-Cov-2 infection and should not be used as the sole basis for treatment or other patient management decisions. A negative result may occur with  improper specimen collection/handling, submission of specimen other than nasopharyngeal swab, presence of viral mutation(s) within the areas targeted by this assay, and inadequate number of viral copies(<138 copies/mL). A negative result must be combined with clinical observations, patient history, and epidemiological information. The expected result is Negative.  Fact Sheet for Patients:  EntrepreneurPulse.com.au  Fact Sheet for Healthcare Providers:  IncredibleEmployment.be  This test is no t yet approved or cleared by the [place]  States FDA and  has been authorized for detection and/or diagnosis of SARS-CoV-2  by FDA under an Emergency Use Authorization (EUA). This EUA will remain  in effect (meaning this test can be used) for the duration of the COVID-19 declaration under Section 564(b)(1) of the Act, 21 U.S.C.section 360bbb-3(b)(1), unless the authorization is terminated  or revoked sooner.       Influenza A by PCR NEGATIVE NEGATIVE Final   Influenza B by PCR NEGATIVE NEGATIVE Final    Comment: (NOTE) The Xpert Xpress SARS-CoV-2/FLU/RSV plus assay is intended as an aid in the diagnosis of influenza from Nasopharyngeal swab specimens and should not be used as a sole basis for treatment. Nasal washings and aspirates are unacceptable for Xpert Xpress SARS-CoV-2/FLU/RSV testing.  Fact Sheet for Patients: EntrepreneurPulse.com.au  Fact Sheet for Healthcare Providers: IncredibleEmployment.be  This test is not yet approved or cleared by the [place] FDA and has been authorized for detection and/or diagnosis of SARS-CoV-2 by FDA under an Emergency Use Authorization (EUA). This EUA will remain in effect (meaning this test can be used) for the duration of the COVID-19 declaration under Section 564(b)(1) of the Act, 21 U.S.C. section 360bbb-3(b)(1), unless the authorization is terminated or revoked.  Performed at [facility], [address] [place], [place] [address]   Urine Culture     Status: Abnormal   Collection Time: 08/11/21  4:17 PM   Specimen: Urine, Random  Result Value Ref Range Status   Specimen Description   Final    URINE, RANDOM Performed at [facility], [address], [place], [place] [address]    Special Requests   Final    NONE Performed at [facility], [address] [place], [place] [address]    Culture (A)  Final    <10,000 COLONIES/mL INSIGNIFICANT GROWTH Performed at [facility], [place] [address], [place], [place] [address]    Report Status 08/12/2021 FINAL  Final     Labs: CBC: Recent Labs  Lab 08/15/21 1552 08/16/21 0216 08/17/21 3419 08/17/21 1412 08/18/21 0426 08/19/21 0514 08/20/21 0451  WBC 7.8 7.3 9.5  --  10.2 11.0* 12.8*  NEUTROABS 4.2  --   --   --   --   --   --   HGB 10.1* 10.0* 9.4* 10.9* 9.5* 10.0* 9.8*  HCT 29.6* 30.1* 27.9*  --  28.6* 30.1* 29.3*  MCV 78.3* 79.4* 79.5*  --  80.1 80.1 79.4*  PLT 199 198 207  --  214 216 379   Basic Metabolic Panel: Recent Labs  Lab 08/17/21 0338 08/18/21 0426 08/19/21 0514 08/20/21 0451 08/21/21 0429 08/22/21 0630  NA 134* 130* 130* 128* 128* 134*  K 3.8 4.2 4.0 5.1 4.0 3.7  CL 108 106 106 108 108 113*  CO2 26 22 21* 20* 19* 16*  GLUCOSE 101* 88 87 164* 126* 76  BUN 37* 30* 26* 32* 37* 28*  CREATININE 3.03* 3.21* 2.91* 2.60* 1.97* 1.74*  CALCIUM 11.9* 11.7* 10.7* 10.0 8.6* 8.2*  MG 1.4* 1.4* 2.1 1.8  --   --   PHOS 3.6 4.6 2.4* 3.6  --   --    Liver Function Tests: Recent Labs  Lab 08/16/21 0216  AST 34  ALT 42  ALKPHOS 46  BILITOT 0.8  PROT 11.2*  ALBUMIN 2.2*   CBG: No results for input(s): "GLUCAP" in the last 168 hours.  Discharge time spent: less than 30 minutes.  Signed: Annita Brod, MD [facility] 08/22/2021

## 2021-08-22 NOTE — Progress Notes (Signed)
Pt being discharged home, discharge instructions reviewed with pt, states understanding, pt with no complaints 

## 2021-08-22 NOTE — Progress Notes (Signed)
[facility]  PROGRESS NOTE   Subjective:   Patient seen sitting at bedside  Completed breakfast tray on table Continues to feel well Denies pain No lower extremity edema States he feels less fatigue after steroids.   Creatinine 1.74 Calcium 8.2  Objective:  Vital signs: Blood pressure 117/75, pulse 92, temperature 98.7 F (37.1 C), resp. rate 18, height 6' (1.829 m), weight 108 kg, SpO2 100 %.  Intake/Output Summary (Last 24 hours) at 08/22/2021 1248 Last data filed at 08/21/2021 1552 Gross per 24 hour  Intake 545.8 ml  Output --  Net 545.8 ml    Filed Weights   08/14/21 0858  Weight: 108 kg     Physical Exam: General:  No acute distress  Head:  Normocephalic, atraumatic. Moist oral mucosal membranes  Eyes:  Anicteric  Lungs:   Clear to auscultation, normal effort  Heart:  S1S2 no rubs  Abdomen:   Soft, nontender, bowel sounds present  Extremities: No peripheral edema.  Neurologic:  Awake, alert, following commands  Skin:  No lesions  Access: None    Basic Metabolic Panel: Recent Labs  Lab 08/17/21 0338 08/18/21 0426 08/19/21 0514 08/20/21 0451 08/21/21 0429 08/22/21 0630  NA 134* 130* 130* 128* 128* 134*  K 3.8 4.2 4.0 5.1 4.0 3.7  CL 108 106 106 108 108 113*  CO2 26 22 21* 20* 19* 16*  GLUCOSE 101* 88 87 164* 126* 76  BUN 37* 30* 26* 32* 37* 28*  CREATININE 3.03* 3.21* 2.91* 2.60* 1.97* 1.74*  CALCIUM 11.9* 11.7* 10.7* 10.0 8.6* 8.2*  MG 1.4* 1.4* 2.1 1.8  --   --   PHOS 3.6 4.6 2.4* 3.6  --   --      CBC: Recent Labs  Lab 08/15/21 1552 08/16/21 0216 08/17/21 0338 08/17/21 1412 08/18/21 0426 08/19/21 0514 08/20/21 0451  WBC 7.8 7.3 9.5  --  10.2 11.0* 12.8*  NEUTROABS 4.2  --   --   --   --   --   --   HGB 10.1* 10.0* 9.4* 10.9* 9.5* 10.0* 9.8*  HCT 29.6* 30.1* 27.9*  --  28.6* 30.1* 29.3*  MCV 78.3* 79.4* 79.5*  --  80.1 80.1 79.4*  PLT 199 198 207  --  214 216 237      Urinalysis: No results for input(s):  "COLORURINE", "LABSPEC", "PHURINE", "GLUCOSEU", "HGBUR", "BILIRUBINUR", "KETONESUR", "PROTEINUR", "UROBILINOGEN", "NITRITE", "LEUKOCYTESUR" in the last 72 hours.  Invalid input(s): "APPERANCEUR"    Imaging: No results found.   Medications:    sodium chloride     sodium chloride     sodium chloride 150 mL/hr at 08/22/21 0853   famotidine (PEPCID) IV (ONCOLOGY)      allopurinol  50 mg Oral Daily   vitamin B-12  1,000 mcg Oral Daily   folic acid  1 mg Oral Daily   multivitamin with minerals  1 tablet Oral Daily   nicotine  21 mg Transdermal Daily   senna-docusate  1 tablet Oral BID   thiamine  100 mg Oral Daily   Or   thiamine  100 mg Intravenous Daily    Assessment/ Plan:     Principal Problem:   AKI (acute kidney injury) ([place]) Active Problems:   Electrolyte disturbance   Hypercalcemia   Hypokalemia   Hyponatremia   Microcytic anemia   Smoking   Alcohol use   Sciatica   Abnormal LFTs   Hypomagnesemia   Hyperphosphatemia   Multiple myeloma not having achieved remission ([place])  Obesity (BMI 30-39.9)   Encounter for antineoplastic chemotherapy  Mr. Tanner [name] is a 36 y.o.  male with past medical history of alcohol and tobacco use, anxiety, and sciatica, who was admitted to [facility] on 08/14/2021 for Hypercalcemia [E83.52] Electrolyte disturbance [E87.8] Abnormal laboratory test result [R89.9] AKI (acute kidney injury) ([place]) [N17.9] Fatigue, unspecified type [R53.83]   #1: Acute kidney injury: Patient with acute kidney injury most likely secondary to metastatic myeloma with a component of prerenal azotemia.   Renal function improving with adequate urine output.  Follow-up with oncology for further management.  Patient cleared to discharge from renal stance.  We will schedule outpatient follow-up in 1 to 2 weeks at our office to monitor renal function.   #2: Hypercalcemia: Hypercalcemia most likely secondary to myeloma.  Patient was given pamidronate on  Saturday.  Calcium 8.2.    #3: Hyponatremia: Sodium 134 today. This will correct and stabilize with MM treatments.   #4: Hyperuricemia: Continue allopurinol at 50 mg daily.   #5: Hyper phosphatemia: Corrected  #6: Hypomagnesemia: Resolved     LOS: [facility] 8/18/202312:48 PM

## 2021-08-22 NOTE — Progress Notes (Signed)
  Transition of Care [facility]) Screening Note   Patient Details  Name: Tanner [name] Date of Birth: Sep 20, 1985   Transition of Care [facility]) CM/SW Contact:    Alberteen Sam, LCSW Phone Number: 08/22/2021, 10:43 AM  Discharge no needs.   Transition of Care Department [facility]) has reviewed patient and no TOC needs have been identified at this time. We will continue to monitor patient advancement through interdisciplinary progression rounds. If new patient transition needs arise, please place a TOC consult.  [place], [place]

## 2021-08-23 ENCOUNTER — Other Ambulatory Visit: Payer: Self-pay

## 2021-08-25 ENCOUNTER — Encounter: Payer: Self-pay | Admitting: Oncology

## 2021-08-25 ENCOUNTER — Ambulatory Visit: Payer: Commercial Managed Care - PPO

## 2021-08-25 ENCOUNTER — Inpatient Hospital Stay: Payer: Commercial Managed Care - PPO | Attending: Oncology

## 2021-08-25 ENCOUNTER — Inpatient Hospital Stay (HOSPITAL_BASED_OUTPATIENT_CLINIC_OR_DEPARTMENT_OTHER): Payer: Commercial Managed Care - PPO | Admitting: Oncology

## 2021-08-25 ENCOUNTER — Inpatient Hospital Stay: Payer: Commercial Managed Care - PPO

## 2021-08-25 VITALS — BP 116/82 | HR 103 | Temp 99.4°F | Resp 18 | Wt 222.9 lb

## 2021-08-25 VITALS — BP 118/81 | HR 88

## 2021-08-25 DIAGNOSIS — Z7189 Other specified counseling: Secondary | ICD-10-CM | POA: Diagnosis not present

## 2021-08-25 DIAGNOSIS — C9000 Multiple myeloma not having achieved remission: Secondary | ICD-10-CM | POA: Insufficient documentation

## 2021-08-25 DIAGNOSIS — N189 Chronic kidney disease, unspecified: Secondary | ICD-10-CM | POA: Insufficient documentation

## 2021-08-25 DIAGNOSIS — Z5112 Encounter for antineoplastic immunotherapy: Secondary | ICD-10-CM | POA: Diagnosis present

## 2021-08-25 DIAGNOSIS — Z5111 Encounter for antineoplastic chemotherapy: Secondary | ICD-10-CM | POA: Diagnosis not present

## 2021-08-25 LAB — CBC WITH DIFFERENTIAL/PLATELET
Abs Immature Granulocytes: 0.68 10*3/uL — ABNORMAL HIGH (ref 0.00–0.07)
Basophils Absolute: 0.1 10*3/uL (ref 0.0–0.1)
Basophils Relative: 1 %
Eosinophils Absolute: 0.3 10*3/uL (ref 0.0–0.5)
Eosinophils Relative: 4 %
HCT: 26.8 % — ABNORMAL LOW (ref 39.0–52.0)
Hemoglobin: 9.1 g/dL — ABNORMAL LOW (ref 13.0–17.0)
Immature Granulocytes: 8 %
Lymphocytes Relative: 22 %
Lymphs Abs: 2 10*3/uL (ref 0.7–4.0)
MCH: 27.3 pg (ref 26.0–34.0)
MCHC: 34 g/dL (ref 30.0–36.0)
MCV: 80.5 fL (ref 80.0–100.0)
Monocytes Absolute: 0.7 10*3/uL (ref 0.1–1.0)
Monocytes Relative: 7 %
Neutro Abs: 5.4 10*3/uL (ref 1.7–7.7)
Neutrophils Relative %: 58 %
Platelets: 202 10*3/uL (ref 150–400)
RBC: 3.33 MIL/uL — ABNORMAL LOW (ref 4.22–5.81)
RDW: 15 % (ref 11.5–15.5)
Smear Review: NORMAL
WBC: 9.1 10*3/uL (ref 4.0–10.5)
nRBC: 0 % (ref 0.0–0.2)

## 2021-08-25 LAB — COMPREHENSIVE METABOLIC PANEL
ALT: 54 U/L — ABNORMAL HIGH (ref 0–44)
AST: 31 U/L (ref 15–41)
Albumin: 2.1 g/dL — ABNORMAL LOW (ref 3.5–5.0)
Alkaline Phosphatase: 80 U/L (ref 38–126)
Anion gap: 3 — ABNORMAL LOW (ref 5–15)
BUN: 17 mg/dL (ref 6–20)
CO2: 18 mmol/L — ABNORMAL LOW (ref 22–32)
Calcium: 7.1 mg/dL — ABNORMAL LOW (ref 8.9–10.3)
Chloride: 107 mmol/L (ref 98–111)
Creatinine, Ser: 1.64 mg/dL — ABNORMAL HIGH (ref 0.61–1.24)
GFR, Estimated: 56 mL/min — ABNORMAL LOW (ref >60)
Glucose, Bld: 149 mg/dL — ABNORMAL HIGH (ref 70–99)
Potassium: 3.4 mmol/L — ABNORMAL LOW (ref 3.5–5.1)
Sodium: 128 mmol/L — ABNORMAL LOW (ref 135–145)
Total Bilirubin: 0.6 mg/dL (ref 0.3–1.2)
Total Protein: >12 g/dL — ABNORMAL HIGH (ref 6.5–8.1)

## 2021-08-25 MED ORDER — DEXAMETHASONE 4 MG PO TABS: 20.0000 mg | ORAL_TABLET | ORAL | 0 refills | Status: DC

## 2021-08-25 MED ORDER — MONTELUKAST SODIUM 10 MG PO TABS
10.0000 mg | ORAL_TABLET | Freq: Once | ORAL | Status: AC
  Administered 2021-08-25: 10 mg via ORAL
  Filled 2021-08-25: qty 1

## 2021-08-25 MED ORDER — DARATUMUMAB-HYALURONIDASE-FIHJ 1800-30000 MG-UT/15ML ~~LOC~~ SOLN
1800.0000 mg | Freq: Once | SUBCUTANEOUS | Status: AC
  Administered 2021-08-25: 1800 mg via SUBCUTANEOUS
  Filled 2021-08-25: qty 15

## 2021-08-25 MED ORDER — SODIUM CHLORIDE 0.9 % IV SOLN
Freq: Once | INTRAVENOUS | Status: AC
  Filled 2021-08-25: qty 250

## 2021-08-25 MED ORDER — SODIUM CHLORIDE 0.9 % IV SOLN
20.0000 mg | Freq: Once | INTRAVENOUS | Status: AC
  Administered 2021-08-25: 20 mg via INTRAVENOUS
  Filled 2021-08-25: qty 2

## 2021-08-25 MED ORDER — BORTEZOMIB CHEMO SQ INJECTION 3.5 MG (2.5MG/ML)
1.3000 mg/m2 | Freq: Once | INTRAMUSCULAR | Status: AC
  Administered 2021-08-25: 3 mg via SUBCUTANEOUS
  Filled 2021-08-25: qty 1.2

## 2021-08-25 MED ORDER — DIPHENHYDRAMINE HCL 25 MG PO CAPS
50.0000 mg | ORAL_CAPSULE | Freq: Once | ORAL | Status: AC
  Administered 2021-08-25: 50 mg via ORAL
  Filled 2021-08-25: qty 2

## 2021-08-25 MED ORDER — ACETAMINOPHEN 325 MG PO TABS
650.0000 mg | ORAL_TABLET | Freq: Once | ORAL | Status: AC
  Administered 2021-08-25: 650 mg via ORAL
  Filled 2021-08-25: qty 2

## 2021-08-25 NOTE — Progress Notes (Signed)
Patient here for new treatment. Reports pain to legs, worse at night.

## 2021-08-25 NOTE — Patient Instructions (Signed)
[facility]  Discharge Instructions: Thank you for choosing [facility] to provide your oncology and hematology care.  If you have a lab appointment with the Cancer Center, please go directly to the Cancer Center and check in at the registration area.  Wear comfortable clothing and clothing appropriate for easy access to any Portacath or PICC line.   We strive to give you quality time with your provider. You may need to reschedule your appointment if you arrive late (15 or more minutes).  Arriving late affects you and other patients whose appointments are after yours.  Also, if you miss three or more appointments without notifying the office, you may be dismissed from the clinic at the provider's discretion.      For prescription refill requests, have your pharmacy contact our office and allow 72 hours for refills to be completed.       To help prevent nausea and vomiting after your treatment, we encourage you to take your nausea medication as directed.  BELOW ARE SYMPTOMS THAT SHOULD BE REPORTED IMMEDIATELY: *FEVER GREATER THAN 100.4 F (38 C) OR HIGHER *CHILLS OR SWEATING *NAUSEA AND VOMITING THAT IS NOT CONTROLLED WITH YOUR NAUSEA MEDICATION *UNUSUAL SHORTNESS OF BREATH *UNUSUAL BRUISING OR BLEEDING *URINARY PROBLEMS (pain or burning when urinating, or frequent urination) *BOWEL PROBLEMS (unusual diarrhea, constipation, pain near the anus) TENDERNESS IN MOUTH AND THROAT WITH OR WITHOUT PRESENCE OF ULCERS (sore throat, sores in mouth, or a toothache) UNUSUAL RASH, SWELLING OR PAIN  UNUSUAL VAGINAL DISCHARGE OR ITCHING   Items with * indicate a potential emergency and should be followed up as soon as possible or go to the Emergency Department if any problems should occur.  Please show the CHEMOTHERAPY ALERT CARD or IMMUNOTHERAPY ALERT CARD at check-in to the Emergency Department and triage nurse.  Should you have questions after your  visit or need to cancel or reschedule your appointment, please contact [facility]  [phone] and follow the prompts.  Office hours are 8:00 a.m. to 4:30 p.m. Monday - Friday. Please note that voicemails left after 4:00 p.m. may not be returned until the following business day.  We are closed weekends and major holidays. You have access to a nurse at all times for urgent questions. Please call the main number to the clinic [phone] and follow the prompts.  For any non-urgent questions, you may also contact your provider using MyChart. We now offer e-Visits for anyone 18 and older to request care online for non-urgent symptoms. For details visit [email or web address].   Also download the MyChart app! Go to the app store, search "MyChart", open the app, select [facility], and log in with your MyChart username and password.  Masks are optional in the cancer centers. If you would like for your care team to wear a mask while they are taking care of you, please let them know. For doctor visits, patients may have with them one support person who is at least 35 years old. At this time, visitors are not allowed in the infusion area.   

## 2021-08-25 NOTE — Progress Notes (Signed)
Hematology/Oncology Progress note [phone] [phone]        REFERRING PROVIDER: [name], MD   Patient Care Team: Pcp, No as PCP - General  ASSESSMENT & PLAN:   Cancer Staging  Multiple myeloma not having achieved remission ([place]) Staging form: Plasma Cell Myeloma and Plasma Cell Disorders, AJCC 8th Edition - Clinical stage from 08/20/2021: Beta-2-microglobulin (mg/L): 3.6, Albumin (g/dL): 2.2, ISS: Stage II, High-risk cytogenetics: Unknown, LDH: Normal - Signed by [name], MD on 08/25/2021   Goals of care, counseling/discussion Discussed with patient that condition is not curable and treatment is with palliative intent.   Multiple myeloma not having achieved remission ([place]) # IgG lamda multiple myeloma On chemotherapy plan with lenalidomide (25 mg daily on days 1-14), subcutaneous bortezomib (1.3 mg/m2 on days 1, 4, 8, and 11), and oral dexamethasone (20 mg on days 1, 2, 8, 9, 15, and 16). SubQ daratumumab on days 1, 8, and 15 of cycles 1 through 4 and day 1 of post-ASCT consolidation cycles (cycles 5 and 6).  Awaiting for Lenalidomide approval.  Labs are reviewed and discussed with patient. Proceed with Daratumumab and Velcade today.   # Chronic kidney disease, creatinine continues to improve.  Avoid nephrotoxins.  Encourage oral hydration. #Hypoalbuminemia, Refer to nutritionist #Hyperuricemia, continue allopurinol  Encounter for antineoplastic chemotherapy Chemotherapy plan as listed above.   Patient gets dexamethasone IV on Day 1, 8, 15. Recommend him to take dexamethasone 20 mg, day 2, 9,16.  Orders Placed This Encounter  Procedures   Ambulatory Referral to [facility] Nutrition    Referral Priority:   Routine    Referral Type:   Consultation    Referral Reason:   Specialty Services Required    Number of Visits Requested:   1   Follow up  8/24 Velcade 1 week lab MD Dara   All questions were answered. The patient knows to call the clinic with  any problems, questions or concerns. No barriers to learning was detected.  [name], MD 08/25/2021   CHIEF COMPLAINTS/PURPOSE OF CONSULTATION:  Multiple myeloma  HISTORY OF PRESENTING ILLNESS:  Tanner [name] 36 y.o. male presents  for follow up of Multiple myeloma I have reviewed his chart and materials related to his cancer extensively and collaborated history with the patient. Summary of oncologic history is as follows: Oncology History  Multiple myeloma not having achieved remission ([place])  08/14/2021 Imaging   CT chest abdomen pelvis without contrast Showed numerous lucencies throughout the bone structures, most pronounced throughout the spine and left left iliac bone.  Otherwise no acute findings in the chest abdomen or pelvis.  Sigmoid diverticulosis.   08/19/2021 Initial Diagnosis   Multiple myeloma not having achieved remission, stage II  -08/15/2021, bone marrow biopsy showed plasma cell myeloma involving greater than 80% of the marrow. Cytogenetics Myeloma  FISH   08/20/2021 Cancer Staging   Staging form: Plasma Cell Myeloma and Plasma Cell Disorders, AJCC 8th Edition - Clinical stage from 08/20/2021: Beta-2-microglobulin (mg/L): 3.6, Albumin (g/dL): 2.2, ISS: Stage II, High-risk cytogenetics: Unknown, LDH: Normal - Signed by [name], MD on 08/25/2021 Beta 2 microglobulin range (mg/L): 3.5 to 5.49 Albumin range (g/dL): Less than 3.5 Cytogenetics: Unknown   08/20/2021 -  Chemotherapy   MYELOMA NEWLY DIAGNOSED TRANSPLANT CANDIDATE DaraVRd (Daratumumab SUBQ) q21d x 6 Cycles (Induction/Consolidation)        INTERVAL HISTORY Tanner [name] is a 36 y.o. male who has above history reviewed by me today presents for follow up visit  for management of multiple myeloma Tolerated cycle 1 day 1 Velcade inpatient.  Denies any numbness tingling. No new complaints today.   MEDICAL HISTORY:  Past Medical History:  Diagnosis Date   Alcohol use    Sciatica     Smoking     SURGICAL HISTORY: Past Surgical History:  Procedure Laterality Date   INGUINAL HERNIA REPAIR      SOCIAL HISTORY: Social History   Socioeconomic History   Marital status: Single    Spouse name: Not on file   Number of children: Not on file   Years of education: Not on file   Highest education level: Not on file  Occupational History   Not on file  Tobacco Use   Smoking status: Every Day    Types: E-cigarettes   Smokeless tobacco: Never  Vaping Use   Vaping Use: Every day  Substance and Sexual Activity   Alcohol use: Yes    Alcohol/week: 24.0 standard drinks of alcohol    Types: 24 Cans of beer per week   Drug use: Never   Sexual activity: Not on file  Other Topics Concern   Not on file  Social History Narrative   Not on file   Social Determinants of Health   Financial Resource Strain: Not on file  Food Insecurity: Not on file  Transportation Needs: Not on file  Physical Activity: Not on file  Stress: Not on file  Social Connections: Not on file  Intimate Partner Violence: Not on file    FAMILY HISTORY: Family History  Problem Relation Age of Onset   Breast cancer Mother    Alcoholism Father     ALLERGIES:  has No Known Allergies.  MEDICATIONS:  Current Outpatient Medications  Medication Sig Dispense Refill   allopurinol (ZYLOPRIM) 100 MG tablet Take 0.5 tablets (50 mg total) by mouth daily. 30 tablet 2   No current facility-administered medications for this visit.    Review of Systems  Constitutional:  Negative for appetite change, chills, fatigue, fever and unexpected weight change.  HENT:   Negative for hearing loss and voice change.   Eyes:  Negative for eye problems and icterus.  Respiratory:  Negative for chest tightness, cough and shortness of breath.   Cardiovascular:  Negative for chest pain and leg swelling.  Gastrointestinal:  Negative for abdominal distention and abdominal pain.  Endocrine: Negative for hot flashes.   Genitourinary:  Negative for difficulty urinating, dysuria and frequency.   Musculoskeletal:  Negative for arthralgias.  Skin:  Negative for itching and rash.  Neurological:  Negative for light-headedness and numbness.  Hematological:  Negative for adenopathy. Does not bruise/bleed easily.  Psychiatric/Behavioral:  Negative for confusion.      PHYSICAL EXAMINATION: ECOG PERFORMANCE STATUS: 1 - Symptomatic but completely ambulatory  Vitals:   08/25/21 0845  BP: 116/82  Pulse: (!) 103  Resp: 18  Temp: 99.4 F (37.4 C)   Filed Weights   08/25/21 0845  Weight: 222 lb 14.4 oz (101.1 kg)    Physical Exam Constitutional:      General: He is not in acute distress.    Appearance: He is obese. He is not diaphoretic.  HENT:     Head: Normocephalic and atraumatic.     Nose: Nose normal.     Mouth/Throat:     Pharynx: No oropharyngeal exudate.  Eyes:     General: No scleral icterus.    Pupils: Pupils are equal, round, and reactive to light.  Cardiovascular:  Rate and Rhythm: Normal rate and regular rhythm.     Heart sounds: No murmur heard. Pulmonary:     Effort: Pulmonary effort is normal. No respiratory distress.     Breath sounds: No rales.  Chest:     Chest wall: No tenderness.  Abdominal:     General: There is no distension.     Palpations: Abdomen is soft.     Tenderness: There is no abdominal tenderness.  Musculoskeletal:        General: Normal range of motion.     Cervical back: Normal range of motion and neck supple.  Skin:    General: Skin is warm and dry.     Findings: No erythema.  Neurological:     Mental Status: He is alert and oriented to person, place, and time.     Cranial Nerves: No cranial nerve deficit.     Motor: No abnormal muscle tone.     Coordination: Coordination normal.  Psychiatric:        Mood and Affect: Affect normal.      LABORATORY DATA:  I have reviewed the data as listed    Latest Ref Rng & Units 08/25/2021    7:57 AM  08/20/2021    4:51 AM 08/19/2021    5:14 AM  CBC  WBC 4.0 - 10.5 K/uL 9.1  12.8  11.0   Hemoglobin 13.0 - 17.0 g/dL 9.1  9.8  10.0   Hematocrit 39.0 - 52.0 % 26.8  29.3  30.1   Platelets 150 - 400 K/uL 202  237  216       Latest Ref Rng & Units 08/25/2021    7:57 AM 08/22/2021    6:30 AM 08/21/2021    4:29 AM  CMP  Glucose 70 - 99 mg/dL 149  76  126   BUN 6 - 20 mg/dL 17  28  37   Creatinine 0.61 - 1.24 mg/dL 1.64  1.74  1.97   Sodium 135 - 145 mmol/L 128  134  128   Potassium 3.5 - 5.1 mmol/L 3.4  3.7  4.0   Chloride 98 - 111 mmol/L 107  113  108   CO2 22 - 32 mmol/L 18  16  19    Calcium 8.9 - 10.3 mg/dL 7.1  8.2  8.6   Total Protein 6.5 - 8.1 g/dL >12.0     Total Bilirubin 0.3 - 1.2 mg/dL 0.6     Alkaline Phos 38 - 126 U/L 80     AST 15 - 41 U/L 31     ALT 0 - 44 U/L 54          RADIOGRAPHIC STUDIES: I have personally reviewed the radiological images as listed and agreed with the findings in the report. CT BONE MARROW BIOPSY & ASPIRATION  Result Date: 08/15/2021 INDICATION: Concern for multiple myeloma EXAM: CT BONE MARROW BIOPSY AND ASPIRATION MEDICATIONS: None. ANESTHESIA/SEDATION: Moderate (conscious) sedation was employed during this procedure. A total of Versed 1 mg and Fentanyl 100 mcg was administered intravenously. Moderate Sedation Time: 15 minutes. The patient's level of consciousness and vital signs were monitored continuously by radiology nursing throughout the procedure under my direct supervision. FLUOROSCOPY TIME:  N/a COMPLICATIONS: None immediate. PROCEDURE: Informed written consent was obtained from the patient after a thorough discussion of the procedural risks, benefits and alternatives. All questions were addressed. Maximal Sterile Barrier Technique was utilized including caps, mask, sterile gowns, sterile gloves, sterile drape, hand hygiene and skin antiseptic. A  timeout was performed prior to the initiation of the procedure. The patient was placed prone on the  CT exam table. Limited CT of the pelvis was performed for planning purposes. Skin entry site was marked, and the overlying skin was prepped and draped in the standard sterile fashion. Local analgesia was obtained with 1% lidocaine. Using CT guidance, an 11 gauge needle was advanced just deep to the cortex of the right posterior ilium. Subsequently, bone marrow aspiration and core biopsy were performed. Specimens were submitted to lab/pathology for handling. Hemostasis was achieved with manual pressure, and a clean dressing was placed. The patient tolerated the procedure well without immediate complication. IMPRESSION: Successful CT-guided bone marrow aspiration core biopsy of the right posterior ilium. Electronically Signed   By: Albin Felling M.D.   On: 08/15/2021 13:36   CT CHEST ABDOMEN PELVIS WO CONTRAST  Result Date: 08/14/2021 CLINICAL DATA:  Weakness, lethargy.  Evaluate for occult malignancy. EXAM: CT CHEST, ABDOMEN AND PELVIS WITHOUT CONTRAST TECHNIQUE: Multidetector CT imaging of the chest, abdomen and pelvis was performed following the standard protocol without IV contrast. RADIATION DOSE REDUCTION: This exam was performed according to the departmental dose-optimization program which includes automated exposure control, adjustment of the mA and/or kV according to patient size and/or use of iterative reconstruction technique. COMPARISON:  None Available. FINDINGS: CT CHEST FINDINGS Cardiovascular: Heart is normal size. Aorta is normal caliber. Mediastinum/Nodes: No mediastinal, hilar, or axillary adenopathy. Trachea and esophagus are unremarkable. Thyroid unremarkable. Lungs/Pleura: Lungs are clear. No focal airspace opacities or suspicious nodules. No effusions. Musculoskeletal: Chest wall soft tissues are unremarkable. Mild appearance with areas of lucency throughout the thoracic spine, sternum and ribs. This can be seen with metastases or myeloma. CT ABDOMEN PELVIS FINDINGS Hepatobiliary: No focal  hepatic abnormality. Gallbladder unremarkable. Pancreas: No focal abnormality or ductal dilatation. Spleen: No focal abnormality.  Normal size. Adrenals/Urinary Tract: No adrenal abnormality. No focal renal abnormality. No stones or hydronephrosis. Urinary bladder is unremarkable. Stomach/Bowel: Sigmoid diverticulosis. No active diverticulitis. Stomach and small bowel decompressed, unremarkable. Normal appendix. Vascular/Lymphatic: No evidence of aneurysm or adenopathy. Reproductive: No visible focal abnormality. Other: No free fluid or free air. Musculoskeletal: Lucencies noted throughout the lumbar spine and bony pelvis, best seen in the left iliac bone. There is cortical destruction seen in the left iliac bone. This could be seen with metastasis or myeloma. IMPRESSION: Numerous lucencies throughout the bony structures, most pronounced throughout the spine and in the left iliac bone. This could reflect metastases or myeloma. Otherwise no acute findings in the chest, abdomen or pelvis. Sigmoid diverticulosis. Electronically Signed   By: Rolm Baptise M.D.   On: 08/14/2021 18:24   US RENAL  Result Date: 08/14/2021 CLINICAL DATA:  Acute kidney injury EXAM: RENAL / URINARY TRACT ULTRASOUND COMPLETE COMPARISON:  None Available. FINDINGS: Right Kidney: Renal measurements: 12.4 x 6.2 x 7.2 cm = volume: 292 mL. Echogenicity may be mildly increased. No mass or hydronephrosis visualized. Left Kidney: Renal measurements: 12.2 x 6.6 x 5.8 cm = volume: 244 mL. Echogenicity may be mildly increased. No mass or hydronephrosis visualized. Bladder: Appears normal for degree of bladder distention. Other: None. IMPRESSION: No hydronephrosis. Possible mild increase in renal echogenicity could reflect medical renal disease. Electronically Signed   By: Macy Mis M.D.   On: 08/14/2021 12:17   DG Chest 2 View  Result Date: 08/14/2021 CLINICAL DATA:  Short of breath EXAM: CHEST - 2 VIEW COMPARISON:  01/11/2020 FINDINGS: The  heart size and mediastinal contours are  within normal limits. Both lungs are clear. The visualized skeletal structures are unremarkable. IMPRESSION: No active cardiopulmonary disease. Electronically Signed   By: Franchot Gallo M.D.   On: 08/14/2021 10:38

## 2021-08-25 NOTE — Assessment & Plan Note (Signed)
Chemotherapy plan as listed above 

## 2021-08-25 NOTE — Progress Notes (Signed)
Nutrition Assessment   Reason for Assessment:  New patient with questions/concerns about nutrition  Acute add on   ASSESSMENT:  36 year old male with new diagnosis of multiple myeloma.  Past medical history of Etoh use, smoking, anxiety.  Recent hospital admission.  Received chemotherapy inpatient.  Chemo being given today.   RD working at [employer] today but able to connect via phone with mother.  Mother with patient in infusion at [facility].  Mother reports that his appetite has been fairly good but wants to cut out the junk in his diet.  Prior to hospitalization decreased intake.  He has stopped drinking sodas and drinking more water.  Has stopped drinking alcohol.     Medications: dexamethasone   Labs: Na 128, K 3.4, glucose 149, BUN 17, creatinine 1.64, Calcium 7.1, albumin 2.1, Mag 1.8, phosphorus 3.6   Anthropometrics:   Height: 72 inches Weight: 222 lb 14.4 oz 240 lb on 03/26/21 BMI: 30  7% weight loss in the last 5 months, concerning   Estimated Energy Needs  Kcals: 2500-3000 Protein: 125-150 g Fluid: 2500-3000 ml   NUTRITION DIAGNOSIS: Inadequate oral intake related to multiple myeloma as evidenced by 7% weight loss in the last 5 months and decreased intake   INTERVENTION:  Discussed importance of adding protein at every meal.  Provided examples of foods high in protein Encouraged oral nutrition supplements 350 calorie or higher. Will email recipes for smoothies.  Encouraged adding plant foods to diet as well.    Discussed foods high in calcium.   Contact information given to mother.     MONITORING, EVALUATION, GOAL: weight trends, intake   Next Visit: Wed, August 30th PHONE CALL.  Mother aware.  Nyemah Watton B. Zenia Resides, [place], [place] Registered Dietitian [phone]

## 2021-08-25 NOTE — Assessment & Plan Note (Addendum)
#  IgG lamda multiple myeloma On chemotherapy plan with lenalidomide (25 mg daily on days 1-14), subcutaneous bortezomib (1.3 mg/m2 on days 1, 4, 8, and 11), and oral dexamethasone (20 mg on days 1, 2, 8, 9, 15, and 16). SubQ daratumumab on days 1, 8, and 15 of cycles 1 through 4 and day 1 of post-ASCT consolidation cycles (cycles 5 and 6).  Awaiting for Lenalidomide approval.  Labs are reviewed and discussed with patient. Proceed with Daratumumab and Velcade today.   # Chronic kidney disease, creatinine continues to improve.  Avoid nephrotoxins.  Encourage oral hydration. #Hypoalbuminemia, Refer to nutritionist #Hyperuricemia, continue allopurinol 

## 2021-08-25 NOTE — Assessment & Plan Note (Signed)
Discussed with patient that condition is not curable and treatment is with palliative intent.

## 2021-08-26 ENCOUNTER — Inpatient Hospital Stay: Payer: Commercial Managed Care - PPO

## 2021-08-26 ENCOUNTER — Other Ambulatory Visit (HOSPITAL_COMMUNITY): Payer: Self-pay

## 2021-08-26 ENCOUNTER — Encounter (HOSPITAL_COMMUNITY): Payer: Self-pay

## 2021-08-26 LAB — KAPPA/LAMBDA LIGHT CHAINS
Kappa free light chain: 6.7 mg/L (ref 3.3–19.4)
Kappa, lambda light chain ratio: 0.01 — ABNORMAL LOW (ref 0.26–1.65)
Lambda free light chains: 1002 mg/L — ABNORMAL HIGH (ref 5.7–26.3)

## 2021-08-26 MED ORDER — LENALIDOMIDE 25 MG PO CAPS: 25.0000 mg | ORAL_CAPSULE | Freq: Every day | ORAL | 0 refills | Status: DC

## 2021-08-26 NOTE — Telephone Encounter (Signed)
Lenalidomide Rx sent to [facility]. Per MD, the plan is for him to start the lenalidomide with cycle 2 on 09/09/21.

## 2021-08-26 NOTE — Telephone Encounter (Signed)
Called patient to let him know his lenalidomide Rx was sent to [facility] pharmacy. Provided him with the phone number to [facility]. I will see the patient in clinic on 08/28/21 for education.

## 2021-08-27 ENCOUNTER — Ambulatory Visit: Payer: Commercial Managed Care - PPO

## 2021-08-27 ENCOUNTER — Telehealth: Payer: Self-pay | Admitting: *Deleted

## 2021-08-27 ENCOUNTER — Other Ambulatory Visit: Payer: Self-pay

## 2021-08-27 NOTE — Telephone Encounter (Signed)
Called back and got automated machine. No option to speak to specific person. If pt is requesting disability forms to be completed, please have them fax to [phone].

## 2021-08-27 NOTE — Telephone Encounter (Signed)
Received a cal from [place] with [employer] regarding this patient disability claim and she would like a return call to go over his disability claim [phone]

## 2021-08-28 ENCOUNTER — Other Ambulatory Visit (HOSPITAL_COMMUNITY): Payer: Self-pay

## 2021-08-28 ENCOUNTER — Other Ambulatory Visit: Payer: Self-pay

## 2021-08-28 ENCOUNTER — Inpatient Hospital Stay: Payer: Commercial Managed Care - PPO | Admitting: Pharmacist

## 2021-08-28 ENCOUNTER — Inpatient Hospital Stay (HOSPITAL_BASED_OUTPATIENT_CLINIC_OR_DEPARTMENT_OTHER): Payer: Commercial Managed Care - PPO | Admitting: Medical Oncology

## 2021-08-28 ENCOUNTER — Encounter: Payer: Self-pay | Admitting: Oncology

## 2021-08-28 ENCOUNTER — Telehealth: Payer: Self-pay | Admitting: Pharmacy Technician

## 2021-08-28 VITALS — BP 122/85 | HR 87 | Temp 99.5°F | Resp 18

## 2021-08-28 DIAGNOSIS — Z5112 Encounter for antineoplastic immunotherapy: Secondary | ICD-10-CM | POA: Diagnosis not present

## 2021-08-28 DIAGNOSIS — C9000 Multiple myeloma not having achieved remission: Secondary | ICD-10-CM

## 2021-08-28 MED ORDER — REVLIMID 25 MG PO CAPS: 25.0000 mg | ORAL_CAPSULE | Freq: Every day | ORAL | 0 refills | Status: DC

## 2021-08-28 MED ORDER — BORTEZOMIB CHEMO SQ INJECTION 3.5 MG (2.5MG/ML)
1.3000 mg/m2 | Freq: Once | INTRAMUSCULAR | Status: AC
  Administered 2021-08-28: 3 mg via SUBCUTANEOUS
  Filled 2021-08-28: qty 1.2

## 2021-08-28 MED ORDER — PROCHLORPERAZINE MALEATE 10 MG PO TABS
10.0000 mg | ORAL_TABLET | Freq: Once | ORAL | Status: AC
  Administered 2021-08-28: 10 mg via ORAL
  Filled 2021-08-28: qty 1

## 2021-08-28 MED ORDER — ACYCLOVIR 400 MG PO TABS: 400.0000 mg | ORAL_TABLET | Freq: Two times a day (BID) | ORAL | 5 refills | Status: DC

## 2021-08-28 MED ORDER — ONDANSETRON HCL 8 MG PO TABS: 8.0000 mg | ORAL_TABLET | Freq: Two times a day (BID) | ORAL | 2 refills | Status: DC | PRN

## 2021-08-28 MED ORDER — ASPIRIN 81 MG PO TBEC: 81.0000 mg | DELAYED_RELEASE_TABLET | Freq: Every day | ORAL | 11 refills | Status: DC

## 2021-08-28 MED ORDER — PROCHLORPERAZINE MALEATE 10 MG PO TABS: 10.0000 mg | ORAL_TABLET | Freq: Four times a day (QID) | ORAL | 2 refills | Status: DC | PRN

## 2021-08-28 NOTE — Progress Notes (Signed)
[facility]  [phone] [phone]  Patient Care Team: Pcp, No as PCP - General   Name of the patient: Tanner [name]  [id number]  07-Jul-1985   Date of visit: 08/28/21  HPI: Patient is a 36 y.o. male with newly diagnosed lambda IgG multiple myeloma. He will be following the GRIFFIN regimen. He received bortezomib and dexamethasone while in the hospital on 08/20/21, daratumumab was added on 08/25/21. He will add on Revlimid (lenalidomide) with cycle 2, starting on 09/09/21  Reason for Consult: Lenalidomide oral chemotherapy education.   PAST MEDICAL HISTORY: Past Medical History:  Diagnosis Date   Alcohol use    Sciatica    Smoking     HEMATOLOGY/ONCOLOGY HISTORY:  Oncology History  Multiple myeloma not having achieved remission ([place])  08/14/2021 Imaging   CT chest abdomen pelvis without contrast Showed numerous lucencies throughout the bone structures, most pronounced throughout the spine and left left iliac bone.  Otherwise no acute findings in the chest abdomen or pelvis.  Sigmoid diverticulosis.   08/19/2021 Initial Diagnosis   Multiple myeloma not having achieved remission, stage II  -08/15/2021, bone marrow biopsy showed plasma cell myeloma involving greater than 80% of the marrow. Cytogenetics Myeloma  FISH   08/20/2021 Cancer Staging   Staging form: Plasma Cell Myeloma and Plasma Cell Disorders, AJCC 8th Edition - Clinical stage from 08/20/2021: Beta-2-microglobulin (mg/L): 3.6, Albumin (g/dL): 2.2, ISS: Stage II, High-risk cytogenetics: Unknown, LDH: Normal - Signed by [name], MD on 08/25/2021 Beta 2 microglobulin range (mg/L): 3.5 to 5.49 Albumin range (g/dL): Less than 3.5 Cytogenetics: Unknown   08/20/2021 -  Chemotherapy   MYELOMA NEWLY DIAGNOSED TRANSPLANT CANDIDATE DaraVRd (Daratumumab SUBQ) q21d x 6 Cycles (Induction/Consolidation)        ALLERGIES:  has No Known Allergies.  MEDICATIONS:   Current Outpatient Medications  Medication Sig Dispense Refill   ondansetron (ZOFRAN) 8 MG tablet Take 1 tablet (8 mg total) by mouth 2 (two) times daily as needed for nausea or vomiting. 20 tablet 2   prochlorperazine (COMPAZINE) 10 MG tablet Take 1 tablet (10 mg total) by mouth every 6 (six) hours as needed for nausea or vomiting. 30 tablet 2   REVLIMID 25 MG capsule Take 1 capsule (25 mg total) by mouth daily. Take for 14 days, then off for 7 days. Repeat every 21 days. 14 capsule 0   acyclovir (ZOVIRAX) 400 MG tablet Take 1 tablet (400 mg total) by mouth 2 (two) times daily. 60 tablet 5   allopurinol (ZYLOPRIM) 100 MG tablet Take 0.5 tablets (50 mg total) by mouth daily. 30 tablet 2   aspirin EC 81 MG tablet Take 1 tablet (81 mg total) by mouth daily. Swallow whole. 90 tablet 11   dexamethasone (DECADRON) 4 MG tablet Take 5 tablets (20 mg total) by mouth See admin instructions. Take 60m once weekly on Tuesdays 60 tablet 0   No current facility-administered medications for this visit.    VITAL SIGNS: There were no vitals taken for this visit. There were no vitals filed for this visit.  Estimated body mass index is 30.23 kg/m as calculated from the following:   Height as of 08/14/21: 6' (1.829 m).   Weight as of 08/25/21: 101.1 kg (222 lb 14.4 oz).  LABS: CBC:    Component Value Date/Time   WBC 9.1 08/25/2021 0757   HGB 9.1 (L) 08/25/2021 0757   HGB 10.9 (L) 08/17/2021 1412   HCT 26.8 (L) 08/25/2021 0757  HCT 42.3 10/12/2011 1207   PLT 202 08/25/2021 0757   PLT 256 10/12/2011 1207   MCV 80.5 08/25/2021 0757   MCV 76 (L) 10/12/2011 1207   NEUTROABS 5.4 08/25/2021 0757   NEUTROABS 2.9 10/12/2011 1207   LYMPHSABS 2.0 08/25/2021 0757   LYMPHSABS 2.4 10/12/2011 1207   MONOABS 0.7 08/25/2021 0757   MONOABS 0.7 10/12/2011 1207   EOSABS 0.3 08/25/2021 0757   EOSABS 0.2 10/12/2011 1207   BASOSABS 0.1 08/25/2021 0757   BASOSABS 0.0 10/12/2011 1207   Comprehensive Metabolic  Panel:    Component Value Date/Time   NA 128 (L) 08/25/2021 0757   NA 140 10/12/2011 1207   K 3.4 (L) 08/25/2021 0757   K 3.6 10/13/2011 0953   CL 107 08/25/2021 0757   CL 104 10/12/2011 1207   CO2 18 (L) 08/25/2021 0757   CO2 28 10/12/2011 1207   BUN 17 08/25/2021 0757   BUN 6 (L) 10/12/2011 1207   CREATININE 1.64 (H) 08/25/2021 0757   CREATININE 0.86 10/12/2011 1207   GLUCOSE 149 (H) 08/25/2021 0757   GLUCOSE 92 10/12/2011 1207   CALCIUM 7.1 (L) 08/25/2021 0757   CALCIUM 8.7 10/12/2011 1207   AST 31 08/25/2021 0757   ALT 54 (H) 08/25/2021 0757   ALKPHOS 80 08/25/2021 0757   BILITOT 0.6 08/25/2021 0757   PROT >12.0 (H) 08/25/2021 0757   ALBUMIN 2.1 (L) 08/25/2021 0757     Present during today's visit: Patient seen in infusion, patient's sister was also present  Start plan: Patient to start lenalidomide on 09/09/21   Patient Education I spoke with patient for overview of new oral chemotherapy medication: lenalidomide   Administration: Counseled patient on administration, dosing, side effects, monitoring, drug-food interactions, safe handling, storage, and disposal. Patient will take: Lenalidomide: Take 1 capsule (25 mg total) by mouth daily. Take for 14 days, then off for 7 days. Repeat every 21 days. Aspirin: Take 1 tablet (81 mg total) by mouth daily. Acyclovir: Take 1 tablet (400 mg total) by mouth 2 (two) times daily. Dexamethasone: Take 5 tablets (20 mg total) by mouth weekly  Side Effects: Side effects include but not limited to: rash/itchy skin, N/V, fatigue, decreased wbc/hgb/plt, constipation or diarrhea.    Drug-drug Interactions (DDI): No current DDIs with lenalidomide  Adherence: After discussion with patient no patient barriers to medication adherence identified.  Reviewed with patient importance of keeping a medication schedule and plan for any missed doses.  Tanner [name] voiced understanding and appreciation. All questions answered. Medication handout,  calendar, and NCCN patient guidelines provided.  Provided patient with [facility] phone number. Patient knows to call the office with questions or concerns. Oral [facility] will continue to follow.  Patient expressed understanding and was in agreement with this plan. He also understands that He can call clinic at any time with any questions, concerns, or complaints.   Medication Access Issues: Lenalidomide is pending fill at [facility], will continue to follow-up  Follow-up plan: RTC in new week  Thank you for allowing me to participate in the care of this patient.   Time Total: 30 mins  Visit consisted of counseling and education on dealing with issues of symptom management in the setting of serious and potentially life-threatening illness.Greater than 50%  of this time was spent counseling and coordinating care related to the above assessment and plan.  Signed by: Darl Pikes, PharmD, BCPS, BCOP, CPP Hematology/Oncology Clinical Pharmacist Practitioner [place]/DB/AP [facility]  [phone]  08/28/2021 4:02 PM

## 2021-08-28 NOTE — Addendum Note (Signed)
Addended by: [name] on: 08/28/2021 01:50 PM   Modules accepted: Orders

## 2021-08-28 NOTE — Telephone Encounter (Signed)
Oral Oncology Patient Advocate Encounter   Began enrollment process for Revlimid copay card through [facility].   [facility] Access Support must contact patient to finalize process and provide billing information.  I have spoken with the patient and they have expressed understanding of the process.  Lady Deutscher, CPhT-Adv Pharmacy Patient Advocate Specialist [facility] Patient Advocate Team Direct Number: [phone]  Fax: [phone]

## 2021-08-28 NOTE — Patient Instructions (Signed)
[facility]  Discharge Instructions: Thank you for choosing [facility] to provide your oncology and hematology care.  If you have a lab appointment with the Cancer Center, please go directly to the Cancer Center and check in at the registration area.  Wear comfortable clothing and clothing appropriate for easy access to any Portacath or PICC line.   We strive to give you quality time with your provider. You may need to reschedule your appointment if you arrive late (15 or more minutes).  Arriving late affects you and other patients whose appointments are after yours.  Also, if you miss three or more appointments without notifying the office, you may be dismissed from the clinic at the provider's discretion.      For prescription refill requests, have your pharmacy contact our office and allow 72 hours for refills to be completed.    Today you received the following chemotherapy and/or immunotherapy agents Velcade      To help prevent nausea and vomiting after your treatment, we encourage you to take your nausea medication as directed.  BELOW ARE SYMPTOMS THAT SHOULD BE REPORTED IMMEDIATELY: *FEVER GREATER THAN 100.4 F (38 C) OR HIGHER *CHILLS OR SWEATING *NAUSEA AND VOMITING THAT IS NOT CONTROLLED WITH YOUR NAUSEA MEDICATION *UNUSUAL SHORTNESS OF BREATH *UNUSUAL BRUISING OR BLEEDING *URINARY PROBLEMS (pain or burning when urinating, or frequent urination) *BOWEL PROBLEMS (unusual diarrhea, constipation, pain near the anus) TENDERNESS IN MOUTH AND THROAT WITH OR WITHOUT PRESENCE OF ULCERS (sore throat, sores in mouth, or a toothache) UNUSUAL RASH, SWELLING OR PAIN  UNUSUAL VAGINAL DISCHARGE OR ITCHING   Items with * indicate a potential emergency and should be followed up as soon as possible or go to the Emergency Department if any problems should occur.  Please show the CHEMOTHERAPY ALERT CARD or IMMUNOTHERAPY ALERT CARD at check-in to the  Emergency Department and triage nurse.  Should you have questions after your visit or need to cancel or reschedule your appointment, please contact [facility]  [phone] and follow the prompts.  Office hours are 8:00 a.m. to 4:30 p.m. Monday - Friday. Please note that voicemails left after 4:00 p.m. may not be returned until the following business day.  We are closed weekends and major holidays. You have access to a nurse at all times for urgent questions. Please call the main number to the clinic [phone] and follow the prompts.  For any non-urgent questions, you may also contact your provider using MyChart. We now offer e-Visits for anyone 18 and older to request care online for non-urgent symptoms. For details visit [email or web address].   Also download the MyChart app! Go to the app store, search "MyChart", open the app, select [facility], and log in with your MyChart username and password.  Masks are optional in the cancer centers. If you would like for your care team to wear a mask while they are taking care of you, please let them know. For doctor visits, patients may have with them one support person who is at least 35 years old. At this time, visitors are not allowed in the infusion area.   

## 2021-08-29 ENCOUNTER — Other Ambulatory Visit: Payer: Commercial Managed Care - PPO

## 2021-08-29 MED FILL — Dexamethasone Sodium Phosphate Inj 100 MG/10ML: INTRAMUSCULAR | Qty: 2 | Status: AC

## 2021-09-01 ENCOUNTER — Inpatient Hospital Stay (HOSPITAL_BASED_OUTPATIENT_CLINIC_OR_DEPARTMENT_OTHER): Payer: Commercial Managed Care - PPO | Admitting: Oncology

## 2021-09-01 ENCOUNTER — Encounter: Payer: Self-pay | Admitting: Oncology

## 2021-09-01 ENCOUNTER — Inpatient Hospital Stay: Payer: Commercial Managed Care - PPO

## 2021-09-01 VITALS — BP 120/71 | HR 102 | Temp 99.5°F | Resp 18 | Wt 223.7 lb

## 2021-09-01 DIAGNOSIS — M545 Low back pain, unspecified: Secondary | ICD-10-CM | POA: Diagnosis not present

## 2021-09-01 DIAGNOSIS — C9000 Multiple myeloma not having achieved remission: Secondary | ICD-10-CM

## 2021-09-01 DIAGNOSIS — Z5112 Encounter for antineoplastic immunotherapy: Secondary | ICD-10-CM | POA: Diagnosis not present

## 2021-09-01 DIAGNOSIS — Z5111 Encounter for antineoplastic chemotherapy: Secondary | ICD-10-CM | POA: Diagnosis not present

## 2021-09-01 DIAGNOSIS — M549 Dorsalgia, unspecified: Secondary | ICD-10-CM | POA: Insufficient documentation

## 2021-09-01 LAB — CBC WITH DIFFERENTIAL/PLATELET
Abs Immature Granulocytes: 0.32 10*3/uL — ABNORMAL HIGH (ref 0.00–0.07)
Basophils Absolute: 0 10*3/uL (ref 0.0–0.1)
Basophils Relative: 0 %
Eosinophils Absolute: 0.2 10*3/uL (ref 0.0–0.5)
Eosinophils Relative: 4 %
HCT: 27.3 % — ABNORMAL LOW (ref 39.0–52.0)
Hemoglobin: 9.4 g/dL — ABNORMAL LOW (ref 13.0–17.0)
Immature Granulocytes: 6 %
Lymphocytes Relative: 16 %
Lymphs Abs: 0.8 10*3/uL (ref 0.7–4.0)
MCH: 27.2 pg (ref 26.0–34.0)
MCHC: 34.4 g/dL (ref 30.0–36.0)
MCV: 78.9 fL — ABNORMAL LOW (ref 80.0–100.0)
Monocytes Absolute: 0.5 10*3/uL (ref 0.1–1.0)
Monocytes Relative: 9 %
Neutro Abs: 3.5 10*3/uL (ref 1.7–7.7)
Neutrophils Relative %: 65 %
Platelets: 204 10*3/uL (ref 150–400)
RBC: 3.46 MIL/uL — ABNORMAL LOW (ref 4.22–5.81)
RDW: 15.6 % — ABNORMAL HIGH (ref 11.5–15.5)
Smear Review: NORMAL
WBC: 5.3 10*3/uL (ref 4.0–10.5)
nRBC: 0.4 % — ABNORMAL HIGH (ref 0.0–0.2)

## 2021-09-01 LAB — COMPREHENSIVE METABOLIC PANEL
ALT: 145 U/L — ABNORMAL HIGH (ref 0–44)
AST: 44 U/L — ABNORMAL HIGH (ref 15–41)
Albumin: 2.4 g/dL — ABNORMAL LOW (ref 3.5–5.0)
Alkaline Phosphatase: 105 U/L (ref 38–126)
Anion gap: 5 (ref 5–15)
BUN: 17 mg/dL (ref 6–20)
CO2: 19 mmol/L — ABNORMAL LOW (ref 22–32)
Calcium: 7.8 mg/dL — ABNORMAL LOW (ref 8.9–10.3)
Chloride: 103 mmol/L (ref 98–111)
Creatinine, Ser: 1.56 mg/dL — ABNORMAL HIGH (ref 0.61–1.24)
GFR, Estimated: 59 mL/min — ABNORMAL LOW (ref >60)
Glucose, Bld: 91 mg/dL (ref 70–99)
Potassium: 3.7 mmol/L (ref 3.5–5.1)
Sodium: 127 mmol/L — ABNORMAL LOW (ref 135–145)
Total Bilirubin: 0.5 mg/dL (ref 0.3–1.2)
Total Protein: >12 g/dL — ABNORMAL HIGH (ref 6.5–8.1)

## 2021-09-01 LAB — MULTIPLE MYELOMA PANEL, SERUM
Albumin SerPl Elph-Mcnc: 3.5 g/dL (ref 2.9–4.4)
Albumin/Glob SerPl: 0.5 — ABNORMAL LOW (ref 0.7–1.7)
Alpha 1: 0.4 g/dL (ref 0.0–0.4)
Alpha2 Glob SerPl Elph-Mcnc: 0.9 g/dL (ref 0.4–1.0)
B-Globulin SerPl Elph-Mcnc: 1.2 g/dL (ref 0.7–1.3)
Gamma Glob SerPl Elph-Mcnc: 5.9 g/dL — ABNORMAL HIGH (ref 0.4–1.8)
Globulin, Total: 8.3 g/dL — ABNORMAL HIGH (ref 2.2–3.9)
IgA: 44 mg/dL — ABNORMAL LOW (ref 90–386)
IgG (Immunoglobin G), Serum: 6888 mg/dL — ABNORMAL HIGH (ref 603–1613)
IgM (Immunoglobulin M), Srm: 17 mg/dL — ABNORMAL LOW (ref 20–172)
M Protein SerPl Elph-Mcnc: 5.8 g/dL — ABNORMAL HIGH
Total Protein ELP: 11.8 g/dL — ABNORMAL HIGH (ref 6.0–8.5)

## 2021-09-01 MED ORDER — DIPHENHYDRAMINE HCL 25 MG PO CAPS
50.0000 mg | ORAL_CAPSULE | Freq: Once | ORAL | Status: AC
  Administered 2021-09-01: 50 mg via ORAL
  Filled 2021-09-01: qty 2

## 2021-09-01 MED ORDER — DARATUMUMAB-HYALURONIDASE-FIHJ 1800-30000 MG-UT/15ML ~~LOC~~ SOLN
1800.0000 mg | Freq: Once | SUBCUTANEOUS | Status: AC
  Administered 2021-09-01: 1800 mg via SUBCUTANEOUS
  Filled 2021-09-01: qty 15

## 2021-09-01 MED ORDER — ACETAMINOPHEN-CODEINE 300-30 MG PO TABS: 1.0000 | ORAL_TABLET | Freq: Four times a day (QID) | ORAL | 0 refills | Status: DC | PRN

## 2021-09-01 MED ORDER — MONTELUKAST SODIUM 10 MG PO TABS
10.0000 mg | ORAL_TABLET | Freq: Once | ORAL | Status: AC
  Administered 2021-09-01: 10 mg via ORAL
  Filled 2021-09-01: qty 1

## 2021-09-01 MED ORDER — ACETAMINOPHEN 325 MG PO TABS
650.0000 mg | ORAL_TABLET | Freq: Once | ORAL | Status: AC
  Administered 2021-09-01: 650 mg via ORAL
  Filled 2021-09-01: qty 2

## 2021-09-01 MED ORDER — DEXAMETHASONE 4 MG PO TABS
20.0000 mg | ORAL_TABLET | Freq: Once | ORAL | Status: AC
  Administered 2021-09-01: 20 mg via ORAL
  Filled 2021-09-01: qty 5

## 2021-09-01 NOTE — Assessment & Plan Note (Signed)
Chemotherapy plan as listed above 

## 2021-09-01 NOTE — Assessment & Plan Note (Signed)
#  IgG lamda multiple myeloma On chemotherapy plan with lenalidomide (25 mg daily on days 1-14), subcutaneous bortezomib (1.3 mg/m2 on days 1, 4, 8, and 11), and oral dexamethasone (20 mg on days 1, 2, 8, 9, 15, and 16). SubQ daratumumab on days 1, 8, and 15 of cycles 1 through 4 and day 1 of post-ASCT consolidation cycles (cycles 5 and 6).  Awaiting for Lenalidomide approval.  Labs are reviewed and discussed with patient. Proceed with Daratumumab and Velcade today.   # Chronic kidney disease, creatinine continues to improve.  Avoid nephrotoxins.  Encourage oral hydration. #Hypoalbuminemia, Refer to nutritionist #Hyperuricemia, continue allopurinol

## 2021-09-01 NOTE — Progress Notes (Signed)
Pt here for follow up. Pt reports pain to back, worse at night. He reports getting short of breath when he has severe pain

## 2021-09-01 NOTE — Patient Instructions (Signed)
[facility]  Discharge Instructions: Thank you for choosing [facility] to provide your oncology and hematology care.  If you have a lab appointment with the Cancer Center, please go directly to the Cancer Center and check in at the registration area.  Wear comfortable clothing and clothing appropriate for easy access to any Portacath or PICC line.   We strive to give you quality time with your provider. You may need to reschedule your appointment if you arrive late (15 or more minutes).  Arriving late affects you and other patients whose appointments are after yours.  Also, if you miss three or more appointments without notifying the office, you may be dismissed from the clinic at the provider's discretion.      For prescription refill requests, have your pharmacy contact our office and allow 72 hours for refills to be completed.    Today you received the following chemotherapy and/or immunotherapy agents Darzalex      To help prevent nausea and vomiting after your treatment, we encourage you to take your nausea medication as directed.  BELOW ARE SYMPTOMS THAT SHOULD BE REPORTED IMMEDIATELY: *FEVER GREATER THAN 100.4 F (38 C) OR HIGHER *CHILLS OR SWEATING *NAUSEA AND VOMITING THAT IS NOT CONTROLLED WITH YOUR NAUSEA MEDICATION *UNUSUAL SHORTNESS OF BREATH *UNUSUAL BRUISING OR BLEEDING *URINARY PROBLEMS (pain or burning when urinating, or frequent urination) *BOWEL PROBLEMS (unusual diarrhea, constipation, pain near the anus) TENDERNESS IN MOUTH AND THROAT WITH OR WITHOUT PRESENCE OF ULCERS (sore throat, sores in mouth, or a toothache) UNUSUAL RASH, SWELLING OR PAIN  UNUSUAL VAGINAL DISCHARGE OR ITCHING   Items with * indicate a potential emergency and should be followed up as soon as possible or go to the Emergency Department if any problems should occur.  Please show the CHEMOTHERAPY ALERT CARD or IMMUNOTHERAPY ALERT CARD at check-in to  the Emergency Department and triage nurse.  Should you have questions after your visit or need to cancel or reschedule your appointment, please contact [facility]  [phone] and follow the prompts.  Office hours are 8:00 a.m. to 4:30 p.m. Monday - Friday. Please note that voicemails left after 4:00 p.m. may not be returned until the following business day.  We are closed weekends and major holidays. You have access to a nurse at all times for urgent questions. Please call the main number to the clinic [phone] and follow the prompts.  For any non-urgent questions, you may also contact your provider using MyChart. We now offer e-Visits for anyone 18 and older to request care online for non-urgent symptoms. For details visit [email or web address].   Also download the MyChart app! Go to the app store, search "MyChart", open the app, select [facility], and log in with your MyChart username and password.  Masks are optional in the cancer centers. If you would like for your care team to wear a mask while they are taking care of you, please let them know. For doctor visits, patients may have with them one support person who is at least 35 years old. At this time, visitors are not allowed in the infusion area.   

## 2021-09-01 NOTE — Assessment & Plan Note (Addendum)
Recommend Tylenol 3 every 6 hours as needed.  side effects reviewed and discussed with patient. Patient verbalized understanding that medications should not be sold or shared, taken with alcohol, or used while driving. Patient educated that medications should not be bitten, chewed, or crushed. patient has been made aware of the sside effects and treatment/ prevention  of constipation,  respiratory depression,  and mental status changes, even lead to overdose that may result in death, if used outside of the parameters that we discussed.

## 2021-09-01 NOTE — Progress Notes (Signed)
Hematology/Oncology Progress note [phone] [phone]        REFERRING PROVIDER: [name], MD   Patient Care Team: Pcp, No as PCP - General  ASSESSMENT & PLAN:   Cancer Staging  Multiple myeloma not having achieved remission ([place]) Staging form: Plasma Cell Myeloma and Plasma Cell Disorders, AJCC 8th Edition - Clinical stage from 08/20/2021: Beta-2-microglobulin (mg/L): 3.6, Albumin (g/dL): 2.2, ISS: Stage II, High-risk cytogenetics: Unknown, LDH: Normal - Signed by [name], MD on 08/25/2021   Multiple myeloma not having achieved remission ([place]) # IgG lamda multiple myeloma On chemotherapy plan with lenalidomide (25 mg daily on days 1-14), subcutaneous bortezomib (1.3 mg/m2 on days 1, 4, 8, and 11), and oral dexamethasone (20 mg on days 1, 2, 8, 9, 15, and 16). SubQ daratumumab on days 1, 8, and 15 of cycles 1 through 4 and day 1 of post-ASCT consolidation cycles (cycles 5 and 6).  Awaiting for Lenalidomide approval.  Labs are reviewed and discussed with patient. Proceed with Daratumumab and Velcade today.   # Chronic kidney disease, creatinine continues to improve.  Avoid nephrotoxins.  Encourage oral hydration. #Hypoalbuminemia, Refer to nutritionist #Hyperuricemia, continue allopurinol  Encounter for antineoplastic chemotherapy Chemotherapy plan as listed above.   Back pain Recommend Tylenol 3 every 6 hours as needed.  side effects reviewed and discussed with patient.  Patient verbalized understanding that medications should not be sold or shared, taken with alcohol, or used while driving. Patient educated that medications should not be bitten, chewed, or crushed. patient has been made aware of the sside effects and treatment/ prevention  of constipation,  respiratory depression,  and mental status changes, even lead to overdose that may result in death, if used outside of the parameters that we discussed.  Recommend patient to obtain dental  clearance Patient gets dexamethasone 87m on Day 1, 8, 15. Recommend him to take dexamethasone 20 mg, day 2, 9,16.   Orders Placed This Encounter  Procedures   Kappa/lambda light chains    Standing Status:   Future    Standing Expiration Date:   10/22/2022   Multiple Myeloma Panel (SPEP&IFE w/QIG)    Standing Status:   Future    Standing Expiration Date:   10/22/2022   CBC with Differential    Standing Status:   Future    Standing Expiration Date:   10/22/2022   Comprehensive metabolic panel    Standing Status:   Future    Standing Expiration Date:   10/22/2022   CBC with Differential    Standing Status:   Future    Standing Expiration Date:   10/29/2022   Comprehensive metabolic panel    Standing Status:   Future    Standing Expiration Date:   10/29/2022   Comprehensive metabolic panel    Standing Status:   Future    Standing Expiration Date:   11/04/2022   CBC with Differential    Standing Status:   Future    Standing Expiration Date:   11/05/2022   Follow up  1 week lab MD Dara-RVD   All questions were answered. The patient knows to call the clinic with any problems, questions or concerns. No barriers to learning was detected.  [name] MD 09/01/2021   CHIEF COMPLAINTS/PURPOSE OF CONSULTATION:  Multiple myeloma  HISTORY OF PRESENTING ILLNESS:  CNicholas Trompeter89y.o. male presents  for follow up of Multiple myeloma I have reviewed his chart and materials related to his cancer extensively and collaborated history  with the patient. Summary of oncologic history is as follows: Oncology History  Multiple myeloma not having achieved remission ([place])  08/14/2021 Imaging   CT chest abdomen pelvis without contrast Showed numerous lucencies throughout the bone structures, most pronounced throughout the spine and left left iliac bone.  Otherwise no acute findings in the chest abdomen or pelvis.  Sigmoid diverticulosis.   08/19/2021 Initial Diagnosis   Multiple  myeloma not having achieved remission, stage II  -08/15/2021, bone marrow biopsy showed plasma cell myeloma involving greater than 80% of the marrow. Cytogenetics Myeloma  FISH   08/20/2021 Cancer Staging   Staging form: Plasma Cell Myeloma and Plasma Cell Disorders, AJCC 8th Edition - Clinical stage from 08/20/2021: Beta-2-microglobulin (mg/L): 3.6, Albumin (g/dL): 2.2, ISS: Stage II, High-risk cytogenetics: Unknown, LDH: Normal - Signed by [name], MD on 08/25/2021 Beta 2 microglobulin range (mg/L): 3.5 to 5.49 Albumin range (g/dL): Less than 3.5 Cytogenetics: Unknown   08/20/2021 -  Chemotherapy   MYELOMA NEWLY DIAGNOSED TRANSPLANT CANDIDATE DaraVRd (Daratumumab SUBQ) q21d x 6 Cycles (Induction/Consolidation)        INTERVAL HISTORY Tanner [name] is a 36 y.o. male who has above history reviewed by me today presents for follow up visit for management of multiple myeloma Patient tolerates treatments.  Denies any neuropathy symptoms. + acute on chronic back pain, Tylenol did not help.   MEDICAL HISTORY:  Past Medical History:  Diagnosis Date   Alcohol use    Sciatica    Smoking     SURGICAL HISTORY: Past Surgical History:  Procedure Laterality Date   INGUINAL HERNIA REPAIR      SOCIAL HISTORY: Social History   Socioeconomic History   Marital status: Single    Spouse name: Not on file   Number of children: Not on file   Years of education: Not on file   Highest education level: Not on file  Occupational History   Not on file  Tobacco Use   Smoking status: Former    Packs/day: 0.50    Years: 10.00    Total pack years: 5.00    Types: E-cigarettes, Cigarettes    Quit date: 08/11/2021    Years since quitting: 0.0   Smokeless tobacco: Never  Vaping Use   Vaping Use: Every day  Substance and Sexual Activity   Alcohol use: Not Currently    Alcohol/week: 24.0 standard drinks of alcohol    Types: 24 Cans of beer per week    Comment: quit drinking approx  08/11/2021   Drug use: Never   Sexual activity: Not on file  Other Topics Concern   Not on file  Social History Narrative   Not on file   Social Determinants of Health   Financial Resource Strain: Not on file  Food Insecurity: Not on file  Transportation Needs: Not on file  Physical Activity: Not on file  Stress: Not on file  Social Connections: Not on file  Intimate Partner Violence: Not on file    FAMILY HISTORY: Family History  Problem Relation Age of Onset   Ovarian cancer Mother    Asthma Mother    Alcoholism Father    Throat cancer Maternal Uncle    Heart disease Maternal Uncle    Lung cancer Maternal Uncle    Lymphoma Maternal Uncle    Lung cancer Paternal Grandmother     ALLERGIES:  has No Known Allergies.  MEDICATIONS:  Current Outpatient Medications  Medication Sig Dispense Refill   acetaminophen-codeine (TYLENOL #3) 300-30 MG  tablet Take 1 tablet by mouth every 6 (six) hours as needed for moderate pain. 30 tablet 0   acyclovir (ZOVIRAX) 400 MG tablet Take 1 tablet (400 mg total) by mouth 2 (two) times daily. 60 tablet 5   allopurinol (ZYLOPRIM) 100 MG tablet Take 0.5 tablets (50 mg total) by mouth daily. 30 tablet 2   dexamethasone (DECADRON) 4 MG tablet Take 5 tablets (20 mg total) by mouth See admin instructions. Take 12m once weekly on Tuesdays 60 tablet 0   ondansetron (ZOFRAN) 8 MG tablet Take 1 tablet (8 mg total) by mouth 2 (two) times daily as needed for nausea or vomiting. 20 tablet 2   prochlorperazine (COMPAZINE) 10 MG tablet Take 1 tablet (10 mg total) by mouth every 6 (six) hours as needed for nausea or vomiting. 30 tablet 2   aspirin EC 81 MG tablet Take 1 tablet (81 mg total) by mouth daily. Swallow whole. (Patient not taking: Reported on 09/01/2021) 90 tablet 11   REVLIMID 25 MG capsule Take 1 capsule (25 mg total) by mouth daily. Take for 14 days, then off for 7 days. Repeat every 21 days. (Patient not taking: Reported on 09/01/2021) 14 capsule 0    No current facility-administered medications for this visit.    Review of Systems  Constitutional:  Negative for appetite change, chills, fatigue, fever and unexpected weight change.  HENT:   Negative for hearing loss and voice change.   Eyes:  Negative for eye problems and icterus.  Respiratory:  Negative for chest tightness, cough and shortness of breath.   Cardiovascular:  Negative for chest pain and leg swelling.  Gastrointestinal:  Negative for abdominal distention and abdominal pain.  Endocrine: Negative for hot flashes.  Genitourinary:  Negative for difficulty urinating, dysuria and frequency.   Musculoskeletal:  Negative for arthralgias.  Skin:  Negative for itching and rash.  Neurological:  Negative for light-headedness and numbness.  Hematological:  Negative for adenopathy. Does not bruise/bleed easily.  Psychiatric/Behavioral:  Negative for confusion.      PHYSICAL EXAMINATION: ECOG PERFORMANCE STATUS: 1 - Symptomatic but completely ambulatory  Vitals:   09/01/21 1308  BP: 120/71  Pulse: (!) 102  Resp: 18  Temp: 99.5 F (37.5 C)   Filed Weights   09/01/21 1308  Weight: 223 lb 11.2 oz (101.5 kg)    Physical Exam Constitutional:      General: He is not in acute distress.    Appearance: He is obese. He is not diaphoretic.  HENT:     Head: Normocephalic and atraumatic.     Nose: Nose normal.     Mouth/Throat:     Pharynx: No oropharyngeal exudate.  Eyes:     General: No scleral icterus.    Pupils: Pupils are equal, round, and reactive to light.  Cardiovascular:     Rate and Rhythm: Normal rate and regular rhythm.     Heart sounds: No murmur heard. Pulmonary:     Effort: Pulmonary effort is normal. No respiratory distress.     Breath sounds: No rales.  Chest:     Chest wall: No tenderness.  Abdominal:     General: There is no distension.     Palpations: Abdomen is soft.     Tenderness: There is no abdominal tenderness.  Musculoskeletal:         General: Normal range of motion.     Cervical back: Normal range of motion and neck supple.  Skin:    General: Skin is warm  and dry.     Findings: No erythema.  Neurological:     Mental Status: He is alert and oriented to person, place, and time.     Cranial Nerves: No cranial nerve deficit.     Motor: No abnormal muscle tone.     Coordination: Coordination normal.  Psychiatric:        Mood and Affect: Affect normal.      LABORATORY DATA:  I have reviewed the data as listed    Latest Ref Rng & Units 09/01/2021   12:52 PM 08/25/2021    7:57 AM 08/20/2021    4:51 AM  CBC  WBC 4.0 - 10.5 K/uL 5.3  9.1  12.8   Hemoglobin 13.0 - 17.0 g/dL 9.4  9.1  9.8   Hematocrit 39.0 - 52.0 % 27.3  26.8  29.3   Platelets 150 - 400 K/uL 204  202  237       Latest Ref Rng & Units 09/01/2021   12:52 PM 08/25/2021    7:57 AM 08/22/2021    6:30 AM  CMP  Glucose 70 - 99 mg/dL 91  149  76   BUN 6 - 20 mg/dL _0 Creatinine 0.61 - 1.24 mg/dL 1.56  1.64  1.74   Sodium 135 - 145 mmol/L 127  128  134   Potassium 3.5 - 5.1 mmol/L 3.7  3.4  3.7   Chloride 98 - 111 mmol/L 103  107  113   CO2 22 - 32 mmol/L _1 Calcium 8.9 - 10.3 mg/dL 7.8  7.1  8.2   Total Protein 6.5 - 8.1 g/dL >12.0  >12.0    Total Bilirubin 0.3 - 1.2 mg/dL 0.5  0.6    Alkaline Phos 38 - 126 U/L 105  80    AST 15 - 41 U/L 44  31    ALT 0 - 44 U/L 145  54         RADIOGRAPHIC STUDIES: I have personally reviewed the radiological images as listed and agreed with the findings in the report. CT BONE MARROW BIOPSY & ASPIRATION  Result Date: 08/15/2021 INDICATION: Concern for multiple myeloma EXAM: CT BONE MARROW BIOPSY AND ASPIRATION MEDICATIONS: None. ANESTHESIA/SEDATION: Moderate (conscious) sedation was employed during this procedure. A total of Versed 1 mg and Fentanyl 100 mcg was administered intravenously. Moderate Sedation Time: 15 minutes. The patient's level of consciousness and vital signs were monitored  continuously by radiology nursing throughout the procedure under my direct supervision. FLUOROSCOPY TIME:  N/a COMPLICATIONS: None immediate. PROCEDURE: Informed written consent was obtained from the patient after a thorough discussion of the procedural risks, benefits and alternatives. All questions were addressed. Maximal Sterile Barrier Technique was utilized including caps, mask, sterile gowns, sterile gloves, sterile drape, hand hygiene and skin antiseptic. A timeout was performed prior to the initiation of the procedure. The patient was placed prone on the CT exam table. Limited CT of the pelvis was performed for planning purposes. Skin entry site was marked, and the overlying skin was prepped and draped in the standard sterile fashion. Local analgesia was obtained with 1% lidocaine. Using CT guidance, an 11 gauge needle was advanced just deep to the cortex of the right posterior ilium. Subsequently, bone marrow aspiration and core biopsy were performed. Specimens were submitted to lab/pathology for handling. Hemostasis was achieved with manual pressure, and a clean dressing was placed. The patient tolerated the procedure well without immediate  complication. IMPRESSION: Successful CT-guided bone marrow aspiration core biopsy of the right posterior ilium. Electronically Signed   By: Albin Felling M.D.   On: 08/15/2021 13:36   CT CHEST ABDOMEN PELVIS WO CONTRAST  Result Date: 08/14/2021 CLINICAL DATA:  Weakness, lethargy.  Evaluate for occult malignancy. EXAM: CT CHEST, ABDOMEN AND PELVIS WITHOUT CONTRAST TECHNIQUE: Multidetector CT imaging of the chest, abdomen and pelvis was performed following the standard protocol without IV contrast. RADIATION DOSE REDUCTION: This exam was performed according to the departmental dose-optimization program which includes automated exposure control, adjustment of the mA and/or kV according to patient size and/or use of iterative reconstruction technique. COMPARISON:  None  Available. FINDINGS: CT CHEST FINDINGS Cardiovascular: Heart is normal size. Aorta is normal caliber. Mediastinum/Nodes: No mediastinal, hilar, or axillary adenopathy. Trachea and esophagus are unremarkable. Thyroid unremarkable. Lungs/Pleura: Lungs are clear. No focal airspace opacities or suspicious nodules. No effusions. Musculoskeletal: Chest wall soft tissues are unremarkable. Mild appearance with areas of lucency throughout the thoracic spine, sternum and ribs. This can be seen with metastases or myeloma. CT ABDOMEN PELVIS FINDINGS Hepatobiliary: No focal hepatic abnormality. Gallbladder unremarkable. Pancreas: No focal abnormality or ductal dilatation. Spleen: No focal abnormality.  Normal size. Adrenals/Urinary Tract: No adrenal abnormality. No focal renal abnormality. No stones or hydronephrosis. Urinary bladder is unremarkable. Stomach/Bowel: Sigmoid diverticulosis. No active diverticulitis. Stomach and small bowel decompressed, unremarkable. Normal appendix. Vascular/Lymphatic: No evidence of aneurysm or adenopathy. Reproductive: No visible focal abnormality. Other: No free fluid or free air. Musculoskeletal: Lucencies noted throughout the lumbar spine and bony pelvis, best seen in the left iliac bone. There is cortical destruction seen in the left iliac bone. This could be seen with metastasis or myeloma. IMPRESSION: Numerous lucencies throughout the bony structures, most pronounced throughout the spine and in the left iliac bone. This could reflect metastases or myeloma. Otherwise no acute findings in the chest, abdomen or pelvis. Sigmoid diverticulosis. Electronically Signed   By: Rolm Baptise M.D.   On: 08/14/2021 18:24   US RENAL  Result Date: 08/14/2021 CLINICAL DATA:  Acute kidney injury EXAM: RENAL / URINARY TRACT ULTRASOUND COMPLETE COMPARISON:  None Available. FINDINGS: Right Kidney: Renal measurements: 12.4 x 6.2 x 7.2 cm = volume: 292 mL. Echogenicity may be mildly increased. No mass or  hydronephrosis visualized. Left Kidney: Renal measurements: 12.2 x 6.6 x 5.8 cm = volume: 244 mL. Echogenicity may be mildly increased. No mass or hydronephrosis visualized. Bladder: Appears normal for degree of bladder distention. Other: None. IMPRESSION: No hydronephrosis. Possible mild increase in renal echogenicity could reflect medical renal disease. Electronically Signed   By: Macy Mis M.D.   On: 08/14/2021 12:17   DG Chest 2 View  Result Date: 08/14/2021 CLINICAL DATA:  Short of breath EXAM: CHEST - 2 VIEW COMPARISON:  01/11/2020 FINDINGS: The heart size and mediastinal contours are within normal limits. Both lungs are clear. The visualized skeletal structures are unremarkable. IMPRESSION: No active cardiopulmonary disease. Electronically Signed   By: Franchot Gallo M.D.   On: 08/14/2021 10:38

## 2021-09-02 ENCOUNTER — Other Ambulatory Visit: Payer: Self-pay

## 2021-09-02 ENCOUNTER — Other Ambulatory Visit (HOSPITAL_COMMUNITY): Payer: Self-pay

## 2021-09-03 ENCOUNTER — Inpatient Hospital Stay: Payer: Commercial Managed Care - PPO

## 2021-09-03 NOTE — Progress Notes (Signed)
Nutrition Follow-up:  Patient with new diagnosis of multiple myeloma.  Patient receiving chemotherapy (velcade and darzalex)  Spoke with patient via phone. Called mother first and she asked RD to speak with patient.  Patient reports that mother is preparing foods for him or he gets foods out to eat.  Does not cook.  Has been drinking ensure shakes (using them in smoothies).  Drinking more water, juices and has cut out sodas.  Dairy products upset his stomach.  Does drink almond milk.      Medications: reviewed  Labs: reviewed  Anthropometrics:   Weight 223 lb on 8/28 222 lb 14.4 oz on 8/21  240 lb on 03/26/21   NUTRITION DIAGNOSIS: Inadequate oral intake improving    INTERVENTION:  Discussed with patient the importance of good nutrition during treatment and weight maintenance. Encouraged good sources of protein and incorporating plant foods in diet.  Continue oral nutrition supplements for added nutrition Encouraged frequent meals/snacks Encouraged hydration.    MONITORING, EVALUATION, GOAL: weight trends, intake   NEXT VISIT: Tuesday, Sept 26 during infusion  Prophet Renwick B. Zenia Resides, [place], [place] Registered Dietitian [phone]

## 2021-09-09 ENCOUNTER — Other Ambulatory Visit: Payer: Self-pay | Admitting: Oncology

## 2021-09-09 ENCOUNTER — Inpatient Hospital Stay: Payer: Commercial Managed Care - PPO | Attending: Oncology

## 2021-09-09 ENCOUNTER — Other Ambulatory Visit: Payer: Self-pay

## 2021-09-09 ENCOUNTER — Encounter: Payer: Self-pay | Admitting: Oncology

## 2021-09-09 ENCOUNTER — Inpatient Hospital Stay: Payer: Commercial Managed Care - PPO

## 2021-09-09 ENCOUNTER — Inpatient Hospital Stay (HOSPITAL_BASED_OUTPATIENT_CLINIC_OR_DEPARTMENT_OTHER): Payer: Commercial Managed Care - PPO | Admitting: Oncology

## 2021-09-09 ENCOUNTER — Other Ambulatory Visit: Payer: Commercial Managed Care - PPO

## 2021-09-09 VITALS — BP 122/80 | HR 111 | Temp 96.0°F | Resp 17 | Wt 215.0 lb

## 2021-09-09 DIAGNOSIS — M545 Low back pain, unspecified: Secondary | ICD-10-CM

## 2021-09-09 DIAGNOSIS — Z5112 Encounter for antineoplastic immunotherapy: Secondary | ICD-10-CM | POA: Diagnosis present

## 2021-09-09 DIAGNOSIS — C9000 Multiple myeloma not having achieved remission: Secondary | ICD-10-CM | POA: Insufficient documentation

## 2021-09-09 DIAGNOSIS — Z5111 Encounter for antineoplastic chemotherapy: Secondary | ICD-10-CM | POA: Diagnosis not present

## 2021-09-09 DIAGNOSIS — D649 Anemia, unspecified: Secondary | ICD-10-CM | POA: Diagnosis not present

## 2021-09-09 DIAGNOSIS — N189 Chronic kidney disease, unspecified: Secondary | ICD-10-CM | POA: Diagnosis not present

## 2021-09-09 DIAGNOSIS — D509 Iron deficiency anemia, unspecified: Secondary | ICD-10-CM | POA: Insufficient documentation

## 2021-09-09 DIAGNOSIS — R000 Tachycardia, unspecified: Secondary | ICD-10-CM | POA: Diagnosis not present

## 2021-09-09 LAB — COMPREHENSIVE METABOLIC PANEL
ALT: 79 U/L — ABNORMAL HIGH (ref 0–44)
AST: 24 U/L (ref 15–41)
Albumin: 2.2 g/dL — ABNORMAL LOW (ref 3.5–5.0)
Alkaline Phosphatase: 94 U/L (ref 38–126)
Anion gap: 6 (ref 5–15)
BUN: 31 mg/dL — ABNORMAL HIGH (ref 6–20)
CO2: 19 mmol/L — ABNORMAL LOW (ref 22–32)
Calcium: 9.4 mg/dL (ref 8.9–10.3)
Chloride: 97 mmol/L — ABNORMAL LOW (ref 98–111)
Creatinine, Ser: 1.8 mg/dL — ABNORMAL HIGH (ref 0.61–1.24)
GFR, Estimated: 50 mL/min — ABNORMAL LOW (ref >60)
Glucose, Bld: 95 mg/dL (ref 70–99)
Potassium: 4.1 mmol/L (ref 3.5–5.1)
Sodium: 122 mmol/L — ABNORMAL LOW (ref 135–145)
Total Bilirubin: 0.9 mg/dL (ref 0.3–1.2)
Total Protein: >12 g/dL — ABNORMAL HIGH (ref 6.5–8.1)

## 2021-09-09 LAB — ABO/RH: ABO/RH(D): O POS

## 2021-09-09 LAB — CBC WITH DIFFERENTIAL/PLATELET
Abs Immature Granulocytes: 0.13 10*3/uL — ABNORMAL HIGH (ref 0.00–0.07)
Basophils Absolute: 0 10*3/uL (ref 0.0–0.1)
Basophils Relative: 1 %
Eosinophils Absolute: 0.2 10*3/uL (ref 0.0–0.5)
Eosinophils Relative: 5 %
HCT: 24.3 % — ABNORMAL LOW (ref 39.0–52.0)
Hemoglobin: 8.4 g/dL — ABNORMAL LOW (ref 13.0–17.0)
Immature Granulocytes: 3 %
Lymphocytes Relative: 39 %
Lymphs Abs: 1.8 10*3/uL (ref 0.7–4.0)
MCH: 27 pg (ref 26.0–34.0)
MCHC: 34.6 g/dL (ref 30.0–36.0)
MCV: 78.1 fL — ABNORMAL LOW (ref 80.0–100.0)
Monocytes Absolute: 0.5 10*3/uL (ref 0.1–1.0)
Monocytes Relative: 10 %
Neutro Abs: 2.1 10*3/uL (ref 1.7–7.7)
Neutrophils Relative %: 42 %
Platelets: 152 10*3/uL (ref 150–400)
RBC: 3.11 MIL/uL — ABNORMAL LOW (ref 4.22–5.81)
RDW: 15.3 % (ref 11.5–15.5)
WBC: 4.8 10*3/uL (ref 4.0–10.5)
nRBC: 0 % (ref 0.0–0.2)

## 2021-09-09 LAB — PRETREATMENT RBC PHENOTYPE: DAT, IgG: NEGATIVE

## 2021-09-09 LAB — PREPARE RBC (CROSSMATCH)

## 2021-09-09 MED ORDER — DEXAMETHASONE SODIUM PHOSPHATE 10 MG/ML IJ SOLN: 20.0000 mg | Freq: Once | INTRAMUSCULAR | Status: DC

## 2021-09-09 MED ORDER — SODIUM CHLORIDE 0.9 % IV SOLN
Freq: Once | INTRAVENOUS | Status: AC
  Filled 2021-09-09: qty 250

## 2021-09-09 MED ORDER — SODIUM CHLORIDE 0.9 % IV SOLN
20.0000 mg | Freq: Once | INTRAVENOUS | Status: AC
  Administered 2021-09-09: 20 mg via INTRAVENOUS
  Filled 2021-09-09: qty 2

## 2021-09-09 NOTE — Assessment & Plan Note (Signed)
Chemotherapy plan as listed above 

## 2021-09-09 NOTE — Assessment & Plan Note (Addendum)
#  IgG lamda multiple myeloma On chemotherapy plan with lenalidomide (25 mg daily on days 1-14), subcutaneous bortezomib (1.3 mg/m2 on days 1, 4, 8, and 11), and oral dexamethasone (20 mg on days 1, 2, 8, 9, 15, and 16). SubQ daratumumab on days 1, 8, and 15 of cycles 1 through 4 and day 1 of post-ASCT consolidation cycles (cycles 5 and 6).   Labs are reviewed and discussed with patient. Hold chemotherapy- Daratumumab and Velcade today.  Rescheduled Dara RVD to next week. Patient had IV fluid and IV dexamethasone today.  Recommend patient to take dexamethasone tomorrow.  # Chronic kidney disease, creatinine continues to improve.  Avoid nephrotoxins.  Encourage oral hydration. #Hypoalbuminemia, Refer to nutritionist #Hyperuricemia, continue allopurinol

## 2021-09-09 NOTE — Assessment & Plan Note (Signed)
1 unit of PRBC transfusion this week.

## 2021-09-09 NOTE — Progress Notes (Signed)
Hematology/Oncology Progress note [phone] [phone]        REFERRING PROVIDER: [name], [name]   Patient Care Team: Pcp, No as PCP - General  ASSESSMENT & PLAN:   Cancer Staging  Multiple myeloma not having achieved remission ([place]) Staging form: Plasma Cell Myeloma and Plasma Cell Disorders, AJCC 8th Edition - Clinical stage from 08/20/2021: Beta-2-microglobulin (mg/L): 3.6, Albumin (g/dL): 2.2, ISS: Stage II, High-risk cytogenetics: Unknown, LDH: Normal - Signed by Tanner Server, [name] on 08/25/2021   Multiple myeloma not having achieved remission ([place]) # IgG lamda multiple myeloma On chemotherapy plan with lenalidomide (25 mg daily on days 1-14), subcutaneous bortezomib (1.3 mg/m2 on days 1, 4, 8, and 11), and oral dexamethasone (20 mg on days 1, 2, 8, 9, 15, and 16). SubQ daratumumab on days 1, 8, and 15 of cycles 1 through 4 and day 1 of post-ASCT consolidation cycles (cycles 5 and 6).   Labs are reviewed and discussed with patient. Hold chemotherapy- Daratumumab and Velcade today.  Rescheduled Dara RVD to next week. Patient had IV fluid and IV dexamethasone today.  Recommend patient to take dexamethasone tomorrow.  # Chronic kidney disease, creatinine continues to improve.  Avoid nephrotoxins.  Encourage oral hydration. #Hypoalbuminemia, Refer to nutritionist #Hyperuricemia, continue allopurinol  Encounter for antineoplastic chemotherapy Chemotherapy plan as listed above.   CKD (chronic kidney disease) Avoid nephrotoxins, encourage oral hydration  Back pain Recommend Tylenol 3 every 6 hours as needed.    Symptomatic anemia 1 unit of PRBC transfusion this week.  Recommend patient to obtain dental clearance Patient gets dexamethasone 105m on Day 1, 8, 15. Recommend him to take dexamethasone 20 mg, day 2, 9,16.   Orders Placed This Encounter  Procedures   CBC with Differential    Standing Status:   Future    Standing Expiration Date:   09/19/2022    Type and screen    Standing Status:   Future    Number of Occurrences:   1    Standing Expiration Date:   09/09/2022   ABO/Rh    Standing Status:   Future    Number of Occurrences:   1    Standing Expiration Date:   09/10/2022   Follow up  1 week lab [name] Dara-RVD   All questions were answered. The patient knows to call the clinic with any problems, questions or concerns. No barriers to learning was detected.  [name] [name] 09/09/2021   CHIEF COMPLAINTS/PURPOSE OF CONSULTATION:  Multiple myeloma  HISTORY OF PRESENTING ILLNESS:  Tanner Wint40y.o. male presents  for follow up of Multiple myeloma I have reviewed his chart and materials related to his cancer extensively and collaborated history with the patient. Summary of oncologic history is as follows: Oncology History  Multiple myeloma not having achieved remission ([place]  08/14/2021 Imaging   CT chest abdomen pelvis without contrast Showed numerous lucencies throughout the bone structures, most pronounced throughout the spine and left left iliac bone.  Otherwise no acute findings in the chest abdomen or pelvis.  Sigmoid diverticulosis.   08/19/2021 Initial Diagnosis   Multiple myeloma not having achieved remission, stage II  -08/15/2021, bone marrow biopsy showed plasma cell myeloma involving greater than 80% of the marrow. Cytogenetics Myeloma  FISH   08/20/2021 Cancer Staging   Staging form: Plasma Cell Myeloma and Plasma Cell Disorders, AJCC 8th Edition - Clinical stage from 08/20/2021: Beta-2-microglobulin (mg/L): 3.6, Albumin (g/dL): 2.2, ISS: Stage II, High-risk cytogenetics: Unknown, LDH: Normal -  Signed by Tanner Server, [name] on 08/25/2021 Beta 2 microglobulin range (mg/L): 3.5 to 5.49 Albumin range (g/dL): Less than 3.5 Cytogenetics: Unknown   08/20/2021 -  Chemotherapy   MYELOMA NEWLY DIAGNOSED TRANSPLANT CANDIDATE DaraVRd (Daratumumab SUBQ) q21d x 6 Cycles (Induction/Consolidation)        INTERVAL  HISTORY Tanner [name] is a 36 y.o. male who has above history reviewed by me today presents for follow up visit for management of multiple myeloma Denies any neuropathy symptoms. + acute on chronic back pain, stable symptoms. Patient feels weak and lightheaded No nausea vomiting diarrhea.  MEDICAL HISTORY:  Past Medical History:  Diagnosis Date   Alcohol use    Sciatica    Smoking     SURGICAL HISTORY: Past Surgical History:  Procedure Laterality Date   INGUINAL HERNIA REPAIR      SOCIAL HISTORY: Social History   Socioeconomic History   Marital status: Single    Spouse name: Not on file   Number of children: Not on file   Years of education: Not on file   Highest education level: Not on file  Occupational History   Not on file  Tobacco Use   Smoking status: Former    Packs/day: 0.50    Years: 10.00    Total pack years: 5.00    Types: E-cigarettes, Cigarettes    Quit date: 08/11/2021    Years since quitting: 0.0   Smokeless tobacco: Never  Vaping Use   Vaping Use: Every day  Substance and Sexual Activity   Alcohol use: Not Currently    Alcohol/week: 24.0 standard drinks of alcohol    Types: 24 Cans of beer per week    Comment: quit drinking approx 08/11/2021   Drug use: Never   Sexual activity: Not on file  Other Topics Concern   Not on file  Social History Narrative   Not on file   Social Determinants of Health   Financial Resource Strain: Not on file  Food Insecurity: Not on file  Transportation Needs: Not on file  Physical Activity: Not on file  Stress: Not on file  Social Connections: Not on file  Intimate Partner Violence: Not on file    FAMILY HISTORY: Family History  Problem Relation Age of Onset   Ovarian cancer Mother    Asthma Mother    Alcoholism Father    Throat cancer Maternal Uncle    Heart disease Maternal Uncle    Lung cancer Maternal Uncle    Lymphoma Maternal Uncle    Lung cancer Paternal Grandmother      ALLERGIES:  has No Known Allergies.  MEDICATIONS:  Current Outpatient Medications  Medication Sig Dispense Refill   acetaminophen-codeine (TYLENOL #3) 300-30 MG tablet Take 1 tablet by mouth every 6 (six) hours as needed for moderate pain. 30 tablet 0   acyclovir (ZOVIRAX) 400 MG tablet Take 1 tablet (400 mg total) by mouth 2 (two) times daily. 60 tablet 5   allopurinol (ZYLOPRIM) 100 MG tablet Take 0.5 tablets (50 mg total) by mouth daily. 30 tablet 2   dexamethasone (DECADRON) 4 MG tablet Take 5 tablets (20 mg total) by mouth See admin instructions. Take 34m once weekly on Tuesdays 60 tablet 0   ondansetron (ZOFRAN) 8 MG tablet Take 1 tablet (8 mg total) by mouth 2 (two) times daily as needed for nausea or vomiting. 20 tablet 2   prochlorperazine (COMPAZINE) 10 MG tablet Take 1 tablet (10 mg total) by mouth every 6 (six) hours as  needed for nausea or vomiting. 30 tablet 2   aspirin EC 81 MG tablet Take 1 tablet (81 mg total) by mouth daily. Swallow whole. (Patient not taking: Reported on 09/01/2021) 90 tablet 11   REVLIMID 25 MG capsule Take 1 capsule (25 mg total) by mouth daily. Take for 14 days, then off for 7 days. Repeat every 21 days. (Patient not taking: Reported on 09/01/2021) 14 capsule 0   No current facility-administered medications for this visit.    Review of Systems  Constitutional:  Negative for appetite change, chills, fatigue, fever and unexpected weight change.  HENT:   Negative for hearing loss and voice change.   Eyes:  Negative for eye problems and icterus.  Respiratory:  Negative for chest tightness, cough and shortness of breath.   Cardiovascular:  Negative for chest pain and leg swelling.  Gastrointestinal:  Negative for abdominal distention and abdominal pain.  Endocrine: Negative for hot flashes.  Genitourinary:  Negative for difficulty urinating, dysuria and frequency.   Musculoskeletal:  Negative for arthralgias.  Skin:  Negative for itching and rash.   Neurological:  Negative for light-headedness and numbness.  Hematological:  Negative for adenopathy. Does not bruise/bleed easily.  Psychiatric/Behavioral:  Negative for confusion.      PHYSICAL EXAMINATION: ECOG PERFORMANCE STATUS: 1 - Symptomatic but completely ambulatory  Vitals:   09/09/21 0905 09/09/21 0944  BP: 122/80   Pulse: (!) 117 (!) 111  Resp: 17   Temp: (!) 96 F (35.6 C)   SpO2: 100%    Filed Weights   09/09/21 0905  Weight: 215 lb (97.5 kg)    Physical Exam Constitutional:      General: He is not in acute distress.    Appearance: He is obese. He is not diaphoretic.  HENT:     Head: Normocephalic and atraumatic.     Nose: Nose normal.     Mouth/Throat:     Pharynx: No oropharyngeal exudate.  Eyes:     General: No scleral icterus.    Pupils: Pupils are equal, round, and reactive to light.  Cardiovascular:     Rate and Rhythm: Normal rate and regular rhythm.     Heart sounds: No murmur heard. Pulmonary:     Effort: Pulmonary effort is normal. No respiratory distress.     Breath sounds: No rales.  Chest:     Chest wall: No tenderness.  Abdominal:     General: There is no distension.     Palpations: Abdomen is soft.     Tenderness: There is no abdominal tenderness.  Musculoskeletal:        General: Normal range of motion.     Cervical back: Normal range of motion and neck supple.  Skin:    General: Skin is warm and dry.     Findings: No erythema.  Neurological:     Mental Status: He is alert and oriented to person, place, and time.     Cranial Nerves: No cranial nerve deficit.     Motor: No abnormal muscle tone.     Coordination: Coordination normal.  Psychiatric:        Mood and Affect: Affect normal.      LABORATORY DATA:  I have reviewed the data as listed    Latest Ref Rng & Units 09/09/2021    8:44 AM 09/01/2021   12:52 PM 08/25/2021    7:57 AM  CBC  WBC 4.0 - 10.5 K/uL 4.8  5.3  9.1   Hemoglobin 13.0 -  17.0 g/dL 8.4  9.4  9.1    Hematocrit 39.0 - 52.0 % 24.3  27.3  26.8   Platelets 150 - 400 K/uL 152  204  202       Latest Ref Rng & Units 09/09/2021    8:44 AM 09/01/2021   12:52 PM 08/25/2021    7:57 AM  CMP  Glucose 70 - 99 mg/dL 95  91  149   BUN 6 - 20 mg/dL _0 Creatinine 0.61 - 1.24 mg/dL 1.80  1.56  1.64   Sodium 135 - 145 mmol/L 122  127  128   Potassium 3.5 - 5.1 mmol/L 4.1  3.7  3.4   Chloride 98 - 111 mmol/L 97  103  107   CO2 22 - 32 mmol/L _1 Calcium 8.9 - 10.3 mg/dL 9.4  7.8  7.1   Total Protein 6.5 - 8.1 g/dL >12.0  >12.0  >12.0   Total Bilirubin 0.3 - 1.2 mg/dL 0.9  0.5  0.6   Alkaline Phos 38 - 126 U/L 94  105  80   AST 15 - 41 U/L 24  44  31   ALT 0 - 44 U/L 79  145  54        RADIOGRAPHIC STUDIES: I have personally reviewed the radiological images as listed and agreed with the findings in the report. CT BONE MARROW BIOPSY & ASPIRATION  Result Date: 08/15/2021 INDICATION: Concern for multiple myeloma EXAM: CT BONE MARROW BIOPSY AND ASPIRATION MEDICATIONS: None. ANESTHESIA/SEDATION: Moderate (conscious) sedation was employed during this procedure. A total of Versed 1 mg and Fentanyl 100 mcg was administered intravenously. Moderate Sedation Time: 15 minutes. The patient's level of consciousness and vital signs were monitored continuously by radiology nursing throughout the procedure under my direct supervision. FLUOROSCOPY TIME:  N/a COMPLICATIONS: None immediate. PROCEDURE: Informed written consent was obtained from the patient after a thorough discussion of the procedural risks, benefits and alternatives. All questions were addressed. Maximal Sterile Barrier Technique was utilized including caps, mask, sterile gowns, sterile gloves, sterile drape, hand hygiene and skin antiseptic. A timeout was performed prior to the initiation of the procedure. The patient was placed prone on the CT exam table. Limited CT of the pelvis was performed for planning purposes. Skin entry site was  marked, and the overlying skin was prepped and draped in the standard sterile fashion. Local analgesia was obtained with 1% lidocaine. Using CT guidance, an 11 gauge needle was advanced just deep to the cortex of the right posterior ilium. Subsequently, bone marrow aspiration and core biopsy were performed. Specimens were submitted to lab/pathology for handling. Hemostasis was achieved with manual pressure, and a clean dressing was placed. The patient tolerated the procedure well without immediate complication. IMPRESSION: Successful CT-guided bone marrow aspiration core biopsy of the right posterior ilium. Electronically Signed   By: Albin Felling M.D.   On: 08/15/2021 13:36   CT CHEST ABDOMEN PELVIS WO CONTRAST  Result Date: 08/14/2021 CLINICAL DATA:  Weakness, lethargy.  Evaluate for occult malignancy. EXAM: CT CHEST, ABDOMEN AND PELVIS WITHOUT CONTRAST TECHNIQUE: Multidetector CT imaging of the chest, abdomen and pelvis was performed following the standard protocol without IV contrast. RADIATION DOSE REDUCTION: This exam was performed according to the departmental dose-optimization program which includes automated exposure control, adjustment of the mA and/or kV according to patient size and/or use of iterative reconstruction technique. COMPARISON:  None Available. FINDINGS: CT CHEST FINDINGS  Cardiovascular: Heart is normal size. Aorta is normal caliber. Mediastinum/Nodes: No mediastinal, hilar, or axillary adenopathy. Trachea and esophagus are unremarkable. Thyroid unremarkable. Lungs/Pleura: Lungs are clear. No focal airspace opacities or suspicious nodules. No effusions. Musculoskeletal: Chest wall soft tissues are unremarkable. Mild appearance with areas of lucency throughout the thoracic spine, sternum and ribs. This can be seen with metastases or myeloma. CT ABDOMEN PELVIS FINDINGS Hepatobiliary: No focal hepatic abnormality. Gallbladder unremarkable. Pancreas: No focal abnormality or ductal  dilatation. Spleen: No focal abnormality.  Normal size. Adrenals/Urinary Tract: No adrenal abnormality. No focal renal abnormality. No stones or hydronephrosis. Urinary bladder is unremarkable. Stomach/Bowel: Sigmoid diverticulosis. No active diverticulitis. Stomach and small bowel decompressed, unremarkable. Normal appendix. Vascular/Lymphatic: No evidence of aneurysm or adenopathy. Reproductive: No visible focal abnormality. Other: No free fluid or free air. Musculoskeletal: Lucencies noted throughout the lumbar spine and bony pelvis, best seen in the left iliac bone. There is cortical destruction seen in the left iliac bone. This could be seen with metastasis or myeloma. IMPRESSION: Numerous lucencies throughout the bony structures, most pronounced throughout the spine and in the left iliac bone. This could reflect metastases or myeloma. Otherwise no acute findings in the chest, abdomen or pelvis. Sigmoid diverticulosis. Electronically Signed   By: Rolm Baptise M.D.   On: 08/14/2021 18:24   US RENAL  Result Date: 08/14/2021 CLINICAL DATA:  Acute kidney injury EXAM: RENAL / URINARY TRACT ULTRASOUND COMPLETE COMPARISON:  None Available. FINDINGS: Right Kidney: Renal measurements: 12.4 x 6.2 x 7.2 cm = volume: 292 mL. Echogenicity may be mildly increased. No mass or hydronephrosis visualized. Left Kidney: Renal measurements: 12.2 x 6.6 x 5.8 cm = volume: 244 mL. Echogenicity may be mildly increased. No mass or hydronephrosis visualized. Bladder: Appears normal for degree of bladder distention. Other: None. IMPRESSION: No hydronephrosis. Possible mild increase in renal echogenicity could reflect medical renal disease. Electronically Signed   By: Macy Mis M.D.   On: 08/14/2021 12:17   DG Chest 2 View  Result Date: 08/14/2021 CLINICAL DATA:  Short of breath EXAM: CHEST - 2 VIEW COMPARISON:  01/11/2020 FINDINGS: The heart size and mediastinal contours are within normal limits. Both lungs are clear. The  visualized skeletal structures are unremarkable. IMPRESSION: No active cardiopulmonary disease. Electronically Signed   By: Franchot Gallo M.D.   On: 08/14/2021 10:38

## 2021-09-09 NOTE — Assessment & Plan Note (Signed)
Avoid nephrotoxins, encourage oral hydration 

## 2021-09-09 NOTE — Assessment & Plan Note (Signed)
Recommend Tylenol 3 every 6 hours as needed.

## 2021-09-10 ENCOUNTER — Inpatient Hospital Stay: Payer: Commercial Managed Care - PPO

## 2021-09-10 ENCOUNTER — Ambulatory Visit
Admission: RE | Admit: 2021-09-10 | Discharge: 2021-09-10 | Disposition: A | Discharge status: Home / Self Care | Payer: Commercial Managed Care - PPO | Source: Ambulatory Visit | Attending: Oncology | Admitting: Oncology

## 2021-09-10 DIAGNOSIS — C9000 Multiple myeloma not having achieved remission: Secondary | ICD-10-CM

## 2021-09-10 DIAGNOSIS — Z5112 Encounter for antineoplastic immunotherapy: Secondary | ICD-10-CM | POA: Diagnosis not present

## 2021-09-10 DIAGNOSIS — D649 Anemia, unspecified: Secondary | ICD-10-CM

## 2021-09-10 LAB — KAPPA/LAMBDA LIGHT CHAINS
Kappa free light chain: 3.1 mg/L — ABNORMAL LOW (ref 3.3–19.4)
Kappa, lambda light chain ratio: 0 — ABNORMAL LOW (ref 0.26–1.65)
Lambda free light chains: 1136.7 mg/L — ABNORMAL HIGH (ref 5.7–26.3)

## 2021-09-10 LAB — GLUCOSE, CAPILLARY: Glucose-Capillary: 99 mg/dL (ref 70–99)

## 2021-09-10 MED ORDER — DIPHENHYDRAMINE HCL 25 MG PO CAPS
25.0000 mg | ORAL_CAPSULE | Freq: Once | ORAL | Status: AC
  Administered 2021-09-10: 25 mg via ORAL
  Filled 2021-09-10: qty 1

## 2021-09-10 MED ORDER — ACETAMINOPHEN 325 MG PO TABS
650.0000 mg | ORAL_TABLET | Freq: Once | ORAL | Status: AC
  Administered 2021-09-10: 650 mg via ORAL
  Filled 2021-09-10: qty 2

## 2021-09-10 MED ORDER — SODIUM CHLORIDE 0.9% IV SOLUTION
250.0000 mL | Freq: Once | INTRAVENOUS | Status: AC
  Administered 2021-09-10: 250 mL via INTRAVENOUS
  Filled 2021-09-10: qty 250

## 2021-09-10 MED ORDER — FLUDEOXYGLUCOSE F - 18 (FDG) INJECTION
11.5000 | Freq: Once | INTRAVENOUS | Status: AC | PRN
  Administered 2021-09-10: 12.5 via INTRAVENOUS

## 2021-09-11 LAB — BPAM RBC
Blood Product Expiration Date: 202309262359
ISSUE DATE / TIME: 202309061314
Unit Type and Rh: 5100

## 2021-09-11 LAB — TYPE AND SCREEN
ABO/RH(D): O POS
Antibody Screen: NEGATIVE
Unit division: 0

## 2021-09-12 ENCOUNTER — Encounter: Payer: Self-pay | Admitting: Oncology

## 2021-09-12 ENCOUNTER — Ambulatory Visit: Payer: Commercial Managed Care - PPO

## 2021-09-12 ENCOUNTER — Other Ambulatory Visit: Payer: Self-pay | Admitting: Oncology

## 2021-09-12 DIAGNOSIS — C9000 Multiple myeloma not having achieved remission: Secondary | ICD-10-CM

## 2021-09-12 NOTE — Addendum Note (Signed)
Addended by: [name] on: 09/12/2021 09:19 AM   Modules accepted: Orders

## 2021-09-13 ENCOUNTER — Other Ambulatory Visit: Payer: Self-pay

## 2021-09-16 ENCOUNTER — Other Ambulatory Visit: Payer: Self-pay

## 2021-09-16 ENCOUNTER — Telehealth: Payer: Self-pay | Admitting: *Deleted

## 2021-09-16 ENCOUNTER — Inpatient Hospital Stay: Payer: Commercial Managed Care - PPO

## 2021-09-16 ENCOUNTER — Ambulatory Visit
Admission: RE | Admit: 2021-09-16 | Discharge: 2021-09-16 | Disposition: A | Discharge status: Home / Self Care | Payer: Commercial Managed Care - PPO | Source: Ambulatory Visit | Attending: Oncology | Admitting: Oncology

## 2021-09-16 ENCOUNTER — Inpatient Hospital Stay (HOSPITAL_BASED_OUTPATIENT_CLINIC_OR_DEPARTMENT_OTHER): Payer: Commercial Managed Care - PPO | Admitting: Oncology

## 2021-09-16 ENCOUNTER — Other Ambulatory Visit: Payer: Commercial Managed Care - PPO

## 2021-09-16 ENCOUNTER — Other Ambulatory Visit: Payer: Self-pay | Admitting: Oncology

## 2021-09-16 ENCOUNTER — Ambulatory Visit: Payer: Commercial Managed Care - PPO

## 2021-09-16 ENCOUNTER — Encounter: Payer: Self-pay | Admitting: Oncology

## 2021-09-16 VITALS — BP 115/74 | HR 111 | Temp 97.6°F | Resp 18 | Ht 72.0 in | Wt 217.6 lb

## 2021-09-16 DIAGNOSIS — D649 Anemia, unspecified: Secondary | ICD-10-CM

## 2021-09-16 DIAGNOSIS — Z5111 Encounter for antineoplastic chemotherapy: Secondary | ICD-10-CM | POA: Diagnosis not present

## 2021-09-16 DIAGNOSIS — C9000 Multiple myeloma not having achieved remission: Secondary | ICD-10-CM

## 2021-09-16 DIAGNOSIS — R000 Tachycardia, unspecified: Secondary | ICD-10-CM | POA: Insufficient documentation

## 2021-09-16 DIAGNOSIS — Z5112 Encounter for antineoplastic immunotherapy: Secondary | ICD-10-CM | POA: Diagnosis not present

## 2021-09-16 DIAGNOSIS — M545 Low back pain, unspecified: Secondary | ICD-10-CM

## 2021-09-16 DIAGNOSIS — D509 Iron deficiency anemia, unspecified: Secondary | ICD-10-CM

## 2021-09-16 DIAGNOSIS — N189 Chronic kidney disease, unspecified: Secondary | ICD-10-CM | POA: Diagnosis not present

## 2021-09-16 LAB — CBC WITH DIFFERENTIAL/PLATELET
Abs Immature Granulocytes: 0.03 10*3/uL (ref 0.00–0.07)
Basophils Absolute: 0 10*3/uL (ref 0.0–0.1)
Basophils Relative: 0 %
Eosinophils Absolute: 0.1 10*3/uL (ref 0.0–0.5)
Eosinophils Relative: 2 %
HCT: 23.1 % — ABNORMAL LOW (ref 39.0–52.0)
Hemoglobin: 8 g/dL — ABNORMAL LOW (ref 13.0–17.0)
Immature Granulocytes: 1 %
Lymphocytes Relative: 45 %
Lymphs Abs: 1.5 10*3/uL (ref 0.7–4.0)
MCH: 27.6 pg (ref 26.0–34.0)
MCHC: 34.6 g/dL (ref 30.0–36.0)
MCV: 79.7 fL — ABNORMAL LOW (ref 80.0–100.0)
Monocytes Absolute: 0.3 10*3/uL (ref 0.1–1.0)
Monocytes Relative: 8 %
Neutro Abs: 1.5 10*3/uL — ABNORMAL LOW (ref 1.7–7.7)
Neutrophils Relative %: 44 %
Platelets: 121 10*3/uL — ABNORMAL LOW (ref 150–400)
RBC: 2.9 MIL/uL — ABNORMAL LOW (ref 4.22–5.81)
RDW: 15.6 % — ABNORMAL HIGH (ref 11.5–15.5)
WBC: 3.4 10*3/uL — ABNORMAL LOW (ref 4.0–10.5)
nRBC: 0 % (ref 0.0–0.2)

## 2021-09-16 LAB — COMPREHENSIVE METABOLIC PANEL
ALT: 45 U/L — ABNORMAL HIGH (ref 0–44)
AST: 22 U/L (ref 15–41)
Albumin: 2 g/dL — ABNORMAL LOW (ref 3.5–5.0)
Alkaline Phosphatase: 79 U/L (ref 38–126)
Anion gap: 3 — ABNORMAL LOW (ref 5–15)
BUN: 26 mg/dL — ABNORMAL HIGH (ref 6–20)
CO2: 19 mmol/L — ABNORMAL LOW (ref 22–32)
Calcium: 8.2 mg/dL — ABNORMAL LOW (ref 8.9–10.3)
Chloride: 104 mmol/L (ref 98–111)
Creatinine, Ser: 1.36 mg/dL — ABNORMAL HIGH (ref 0.61–1.24)
GFR, Estimated: >60 mL/min (ref >60)
Glucose, Bld: 111 mg/dL — ABNORMAL HIGH (ref 70–99)
Potassium: 3.6 mmol/L (ref 3.5–5.1)
Sodium: 126 mmol/L — ABNORMAL LOW (ref 135–145)
Total Bilirubin: 0.6 mg/dL (ref 0.3–1.2)
Total Protein: >12 g/dL — ABNORMAL HIGH (ref 6.5–8.1)

## 2021-09-16 LAB — IRON AND TIBC
Iron: 181 ug/dL (ref 45–182)
Saturation Ratios: 85 % — ABNORMAL HIGH (ref 17.9–39.5)
TIBC: 214 ug/dL — ABNORMAL LOW (ref 250–450)
UIBC: 33 ug/dL

## 2021-09-16 LAB — PREPARE RBC (CROSSMATCH)

## 2021-09-16 LAB — FERRITIN: Ferritin: 3393 ng/mL — ABNORMAL HIGH (ref 24–336)

## 2021-09-16 MED ORDER — DIPHENHYDRAMINE HCL 25 MG PO CAPS
25.0000 mg | ORAL_CAPSULE | Freq: Once | ORAL | Status: AC
  Administered 2021-09-16: 25 mg via ORAL

## 2021-09-16 MED ORDER — SODIUM CHLORIDE 0.9% FLUSH: 3.0000 mL | INTRAVENOUS | Status: DC | PRN

## 2021-09-16 MED ORDER — SODIUM CHLORIDE 0.9% FLUSH: 10.0000 mL | INTRAVENOUS | Status: DC | PRN

## 2021-09-16 MED ORDER — HEPARIN SOD (PORK) LOCK FLUSH 100 UNIT/ML IV SOLN: 500.0000 [IU] | Freq: Every day | INTRAVENOUS | Status: DC | PRN

## 2021-09-16 MED ORDER — HEPARIN SOD (PORK) LOCK FLUSH 100 UNIT/ML IV SOLN: 250.0000 [IU] | INTRAVENOUS | Status: DC | PRN

## 2021-09-16 MED ORDER — LORAZEPAM 2 MG/ML IJ SOLN
0.5000 mg | Freq: Once | INTRAMUSCULAR | Status: AC
  Administered 2021-09-16: 0.5 mg via INTRAVENOUS

## 2021-09-16 MED ORDER — ACETAMINOPHEN 325 MG PO TABS
650.0000 mg | ORAL_TABLET | Freq: Once | ORAL | Status: AC
  Administered 2021-09-16: 650 mg via ORAL

## 2021-09-16 MED ORDER — SODIUM CHLORIDE 0.9% IV SOLUTION
250.0000 mL | Freq: Once | INTRAVENOUS | Status: AC
  Administered 2021-09-16: 250 mL via INTRAVENOUS

## 2021-09-16 NOTE — Telephone Encounter (Signed)
Call from NCM Chris with [employer]  asking about where patient is going to have bone marrow biopsy done and when so that she can make sure all goes smoothly for patient  

## 2021-09-16 NOTE — Progress Notes (Signed)
Hematology/Oncology Progress note [phone] [phone]        REFERRING PROVIDER: [name], MD    ASSESSMENT & PLAN:   Cancer Staging  Multiple myeloma not having achieved remission [facility]) Staging form: Plasma Cell Myeloma and Plasma Cell Disorders, AJCC 8th Edition - Clinical stage from 08/20/2021: Beta-2-microglobulin (mg/L): 3.6, Albumin (g/dL): 2.2, ISS: Stage II, High-risk cytogenetics: Unknown, LDH: Normal - Signed by [name], MD on 08/25/2021   Multiple myeloma not having achieved remission ([place]) # IgG lamda multiple myeloma On chemotherapy plan with lenalidomide (25 mg daily on days 1-14), subcutaneous bortezomib (1.3 mg/m2 on days 1, 4, 8, and 11), and oral dexamethasone (20 mg on days 1, 2, 8, 9, 15, and 16). SubQ daratumumab on days 1, 8, and 15 of cycles 1 through 4 and day 1 of post-ASCT consolidation cycles (cycles 5 and 6).   Labs are reviewed and discussed with patient. Hold Velcade and Revlimid  due to fatigue, tachycardia, anxiety, see below.  Proceed with Daratumumab injection. Re-evaluate on D4 for Velcade  #Hypoalbuminemia, follow up with nutritionist #Hyperuricemia, continue allopurinol  Encounter for antineoplastic chemotherapy Chemotherapy plan as listed above.   CKD (chronic kidney disease) Avoid nephrotoxins, encourage oral hydration  Microcytic anemia    Symptomatic anemia Multifactorial, myeloma marrow involvement, chemotherapy Repeat Iron panel today.  He is symptomatic with tachycardia, fatigue, mild SOB.  Recommend 1 unit of PRBC transfusion today  Back pain Recommend Tylenol 3 every 6 hours as needed.   RadOnc evaluation  Tachycardia Etiology unclear, multifactorial Differential Diagnosis:  Tachycardia due to severe anemia, anxiety, underlying heart disease etc Check 2D Echo I discussed about trials of anxiety medication, and patient declines. He agrees with 1 dose of Ativan 0.76m prior to blood transfusion,  but would decline a prescription of PRN Ativan He will further discuss with his mother and update me if he changes his mind.   Recommend patient to obtain dental clearance Patient gets dexamethasone 25mon Day 1, 8, 15. Recommend him to take dexamethasone 20 mg, day 2, 9,16.   Orders Placed This Encounter  Procedures   Ferritin    Standing Status:   Future    Number of Occurrences:   1    Standing Expiration Date:   09/17/2022   Iron and TIBC    Standing Status:   Future    Number of Occurrences:   1    Standing Expiration Date:   09/17/2022   CBC with Differential/Platelet    Standing Status:   Future    Standing Expiration Date:   09/20/2022   Ambulatory referral to Cardiology    Referral Priority:   Routine    Referral Type:   Consultation    Referral Reason:   Specialty Services Required    Requested Specialty:   Cardiology    Number of Visits Requested:   1   ECHOCARDIOGRAM COMPLETE    Standing Status:   Future    Standing Expiration Date:   09/17/2022    Order Specific Question:   Where should this test be performed    Answer:   [facility]    Order Specific Question:   Perflutren DEFINITY (image enhancing agent) should be administered unless hypersensitivity or allergy exist    Answer:   Administer Perflutren    Order Specific Question:   Reason for exam-Echo    Answer:   Other-Full Diagnosis List    Order Specific Question:   Full ICD-10/Reason for Exam  Answer:   Tachycardia [242249]   Follow up  1 week lab MD Dara-R[facility]  We spent sufficient time to discuss many aspect of care, questions were answered to patient's satisfaction. A total of 40 minutes was spent on this visit.  With 5 minutes spent reviewing labs, 30 minutes counseling the patient on chemotherapy treatments, side effects of the treatment, management of symptoms.  Additional 5 minutes was spent on answering patient's questions.    All questions were answered. The patient knows to call the clinic  with any problems, questions or concerns. No barriers to learning was detected.  [name], MD 09/16/2021   CHIEF COMPLAINTS/PURPOSE OF CONSULTATION:  Multiple myeloma  HISTORY OF PRESENTING ILLNESS:  Tanner [name] 36 y.o. male presents  for follow up of Multiple myeloma I have reviewed his chart and materials related to his cancer extensively and collaborated history with the patient. Summary of oncologic history is as follows: Oncology History  Multiple myeloma not having achieved remission ([place])  08/14/2021 Imaging   CT chest abdomen pelvis without contrast Showed numerous lucencies throughout the bone structures, most pronounced throughout the spine and left left iliac bone.  Otherwise no acute findings in the chest abdomen or pelvis.  Sigmoid diverticulosis.   08/19/2021 Initial Diagnosis   Multiple myeloma not having achieved remission, stage II  -08/15/2021, bone marrow biopsy showed plasma cell myeloma involving greater than 80% of the marrow. Cytogenetics Myeloma  FISH   08/20/2021 Cancer Staging   Staging form: Plasma Cell Myeloma and Plasma Cell Disorders, AJCC 8th Edition - Clinical stage from 08/20/2021: Beta-2-microglobulin (mg/L): 3.6, Albumin (g/dL): 2.2, ISS: Stage II, High-risk cytogenetics: Unknown, LDH: Normal - Signed by [name], MD on 08/25/2021 Beta 2 microglobulin range (mg/L): 3.5 to 5.49 Albumin range (g/dL): Less than 3.5 Cytogenetics: Unknown   08/20/2021 -  Chemotherapy   MYELOMA NEWLY DIAGNOSED TRANSPLANT CANDIDATE DaraVRd (Daratumumab SUBQ) q21d x 6 Cycles (Induction/Consolidation)        INTERVAL HISTORY Tanner [name] is a 36 y.o. male who has above history reviewed by me today presents for follow up visit for management of multiple myeloma Denies any neuropathy symptoms. + acute on chronic back pain, stable symptoms. Patient feels weak and lightheaded No nausea vomiting diarrhea.  MEDICAL HISTORY:  Past Medical History:   Diagnosis Date   Alcohol use    Sciatica    Smoking     SURGICAL HISTORY: Past Surgical History:  Procedure Laterality Date   INGUINAL HERNIA REPAIR      SOCIAL HISTORY: Social History   Socioeconomic History   Marital status: Single    Spouse name: Not on file   Number of children: Not on file   Years of education: Not on file   Highest education level: Not on file  Occupational History   Not on file  Tobacco Use   Smoking status: Former    Packs/day: 0.50    Years: 10.00    Total pack years: 5.00    Types: E-cigarettes, Cigarettes    Quit date: 08/11/2021    Years since quitting: 0.0   Smokeless tobacco: Never  Vaping Use   Vaping Use: Every day  Substance and Sexual Activity   Alcohol use: Not Currently    Alcohol/week: 24.0 standard drinks of alcohol    Types: 24 Cans of beer per week    Comment: quit drinking approx 08/11/2021   Drug use: Never   Sexual activity: Not on file  Other Topics Concern  Not on file  Social History Narrative   Not on file   Social Determinants of Health   Financial Resource Strain: Not on file  Food Insecurity: Not on file  Transportation Needs: Not on file  Physical Activity: Not on file  Stress: Not on file  Social Connections: Not on file  Intimate Partner Violence: Not on file    FAMILY HISTORY: Family History  Problem Relation Age of Onset   Ovarian cancer Mother    Asthma Mother    Alcoholism Father    Throat cancer Maternal Uncle    Heart disease Maternal Uncle    Lung cancer Maternal Uncle    Lymphoma Maternal Uncle    Lung cancer Paternal Grandmother     ALLERGIES:  has No Known Allergies.  MEDICATIONS:  Current Outpatient Medications  Medication Sig Dispense Refill   acetaminophen-codeine (TYLENOL #3) 300-30 MG tablet Take 1 tablet by mouth every 6 (six) hours as needed for moderate pain. 30 tablet 0   acyclovir (ZOVIRAX) 400 MG tablet Take 1 tablet (400 mg total) by mouth 2 (two) times daily. 60  tablet 5   allopurinol (ZYLOPRIM) 100 MG tablet Take 0.5 tablets (50 mg total) by mouth daily. 30 tablet 2   dexamethasone (DECADRON) 4 MG tablet Take 5 tablets (20 mg total) by mouth See admin instructions. Take 68m once weekly on Tuesdays 60 tablet 0   ondansetron (ZOFRAN) 8 MG tablet Take 1 tablet (8 mg total) by mouth 2 (two) times daily as needed for nausea or vomiting. 20 tablet 2   prochlorperazine (COMPAZINE) 10 MG tablet Take 1 tablet (10 mg total) by mouth every 6 (six) hours as needed for nausea or vomiting. 30 tablet 2   aspirin EC 81 MG tablet Take 1 tablet (81 mg total) by mouth daily. Swallow whole. (Patient not taking: Reported on 09/16/2021) 90 tablet 11   REVLIMID 25 MG capsule Take 1 capsule (25 mg total) by mouth daily. Take for 14 days, then off for 7 days. Repeat every 21 days. (Patient not taking: Reported on 09/01/2021) 14 capsule 0   No current facility-administered medications for this visit.   Facility-Administered Medications Ordered in Other Visits  Medication Dose Route Frequency Provider Last Rate Last Admin   0.9 %  sodium chloride infusion (Manually program via Guardrails IV Fluids)  250 mL Intravenous Once YEarlie Server MD       acetaminophen (TYLENOL) tablet 650 mg  650 mg Oral Once YEarlie Server MD       diphenhydrAMINE (BENADRYL) capsule 25 mg  25 mg Oral Once YEarlie Server MD       heparin lock flush 100 unit/mL  500 Units Intracatheter Daily PRN YEarlie Server MD       heparin lock flush 100 unit/mL  250 Units Intracatheter PRN YEarlie Server MD       heparin lock flush 100 unit/mL  500 Units Intracatheter Daily PRN YEarlie Server MD       heparin lock flush 100 unit/mL  250 Units Intracatheter PRN YEarlie Server MD       LORazepam (ATIVAN) injection 0.5 mg  0.5 mg Intravenous Once YEarlie Server MD       sodium chloride flush (NS) 0.9 % injection 10 mL  10 mL Intracatheter PRN YEarlie Server MD       sodium chloride flush (NS) 0.9 % injection 10 mL  10 mL Intracatheter PRN YEarlie Server MD        sodium chloride flush (NS)  0.9 % injection 3 mL  3 mL Intracatheter PRN [name], MD       sodium chloride flush (NS) 0.9 % injection 3 mL  3 mL Intracatheter PRN [name], MD        Review of Systems  Constitutional:  Negative for appetite change, chills, fatigue, fever and unexpected weight change.  HENT:   Negative for hearing loss and voice change.   Eyes:  Negative for eye problems and icterus.  Respiratory:  Negative for chest tightness, cough and shortness of breath.   Cardiovascular:  Negative for chest pain and leg swelling.  Gastrointestinal:  Negative for abdominal distention and abdominal pain.  Endocrine: Negative for hot flashes.  Genitourinary:  Negative for difficulty urinating, dysuria and frequency.   Musculoskeletal:  Negative for arthralgias.  Skin:  Negative for itching and rash.  Neurological:  Negative for light-headedness and numbness.  Hematological:  Negative for adenopathy. Does not bruise/bleed easily.  Psychiatric/Behavioral:  Negative for confusion.      PHYSICAL EXAMINATION: ECOG PERFORMANCE STATUS: 1 - Symptomatic but completely ambulatory  Vitals:   09/16/21 0913  BP: 115/74  Pulse: (!) 111  Resp: 18  Temp: 97.6 F (36.4 C)  SpO2: 100%   Filed Weights   09/16/21 0913  Weight: 217 lb 9.6 oz (98.7 kg)    Physical Exam Constitutional:      General: He is not in acute distress.    Appearance: He is obese. He is not diaphoretic.  HENT:     Head: Normocephalic and atraumatic.     Nose: Nose normal.     Mouth/Throat:     Pharynx: No oropharyngeal exudate.  Eyes:     General: No scleral icterus.    Pupils: Pupils are equal, round, and reactive to light.  Cardiovascular:     Rate and Rhythm: Normal rate and regular rhythm.     Heart sounds: No murmur heard. Pulmonary:     Effort: Pulmonary effort is normal. No respiratory distress.     Breath sounds: No rales.  Chest:     Chest wall: No tenderness.  Abdominal:     General: There is no  distension.     Palpations: Abdomen is soft.     Tenderness: There is no abdominal tenderness.  Musculoskeletal:        General: Normal range of motion.     Cervical back: Normal range of motion and neck supple.  Skin:    General: Skin is warm and dry.     Findings: No erythema.  Neurological:     Mental Status: He is alert and oriented to person, place, and time.     Cranial Nerves: No cranial nerve deficit.     Motor: No abnormal muscle tone.     Coordination: Coordination normal.  Psychiatric:        Mood and Affect: Affect normal.      LABORATORY DATA:  I have reviewed the data as listed    Latest Ref Rng & Units 09/16/2021    8:56 AM 09/09/2021    8:44 AM 09/01/2021   12:52 PM  CBC  WBC 4.0 - 10.5 K/uL 3.4  4.8  5.3   Hemoglobin 13.0 - 17.0 g/dL 8.0  8.4  9.4   Hematocrit 39.0 - 52.0 % 23.1  24.3  27.3   Platelets 150 - 400 K/uL 121  152  204       Latest Ref Rng & Units 09/16/2021    8:56 AM  09/09/2021    8:44 AM 09/01/2021   12:52 PM  CMP  Glucose 70 - 99 mg/dL 111  95  91   BUN 6 - 20 mg/dL _0 Creatinine 0.61 - 1.24 mg/dL 1.36  1.80  1.56   Sodium 135 - 145 mmol/L 126  122  127   Potassium 3.5 - 5.1 mmol/L 3.6  4.1  3.7   Chloride 98 - 111 mmol/L 104  97  103   CO2 22 - 32 mmol/L _1 Calcium 8.9 - 10.3 mg/dL 8.2  9.4  7.8   Total Protein 6.5 - 8.1 g/dL >12.0  >12.0  >12.0   Total Bilirubin 0.3 - 1.2 mg/dL 0.6  0.9  0.5   Alkaline Phos 38 - 126 U/L 79  94  105   AST 15 - 41 U/L 22  24  44   ALT 0 - 44 U/L 45  79  145        RADIOGRAPHIC STUDIES: I have personally reviewed the radiological images as listed and agreed with the findings in the report. NM PET Image Initial (PI) Whole Body  Result Date: 09/11/2021 CLINICAL DATA:  Initial treatment strategy for multiple myeloma. EXAM: NUCLEAR MEDICINE PET WHOLE BODY TECHNIQUE: 12.5 mCi F-18 FDG was injected intravenously. Full-ring PET imaging was performed from the head to foot after the  radiotracer. CT data was obtained and used for attenuation correction and anatomic localization. Fasting blood glucose: 99 mg/dl COMPARISON:  CT August 14, 2021 FINDINGS: Mediastinal blood pool activity: SUV max 2.11 HEAD/NECK: Hypermetabolic activity in the clivus, occipital condyles, and left maxilla. For reference lucency throughout the clivus with max SUV of 13.1. There is no hypermetabolic cervical lymph nodes. Incidental CT findings: none CHEST: No hypermetabolic mediastinal or hilar nodes. No suspicious pulmonary nodules on the CT scan. Incidental CT findings: none ABDOMEN/PELVIS: No abnormal hypermetabolic activity within the liver, pancreas, adrenal glands, or spleen. No hypermetabolic lymph nodes in the abdomen or pelvis. Incidental CT findings: Multifocal soft tissue subcutaneous throughout the anterior abdominal wall do not demonstrate significant abnormal FDG avidity and most likely reflects sequela of subcutaneous injections. SKELETON: Extensive hypermetabolic osseous activity with multifocal lucent osseous lesions throughout the visualized axial and appendicular skeleton. For reference: - max SUV in the T12 vertebral body is 10.7 - max SUV in the right hemisacrum of 11.05. -large lucent lesion in the left iliac bone which demonstrates cortical break on image 98/2 with a max SUV of 11.2. Incidental CT findings: none EXTREMITIES: Hypermetabolic foci in the bilateral femurs for instance in the left femur max SUV of 12.9. Incidental CT findings: none IMPRESSION: 1. Extensive multifocal hypermetabolic disease throughout the axial and appendicular skeleton, with numerous lucent osseous lesions, consistent with patient's known diagnosis of multiple myeloma. 2. No tracer avid lymph node or mass identified to suggest soft tissue plasmacytoma Electronically Signed   By: Dahlia Bailiff M.D.   On: 09/11/2021 11:51

## 2021-09-16 NOTE — Assessment & Plan Note (Signed)
Chemotherapy plan as listed above 

## 2021-09-16 NOTE — Assessment & Plan Note (Signed)
Multifactorial, myeloma marrow involvement, chemotherapy Repeat Iron panel today.  He is symptomatic with tachycardia, fatigue, mild SOB.  Recommend 1 unit of PRBC transfusion today

## 2021-09-16 NOTE — Assessment & Plan Note (Signed)
Avoid nephrotoxins, encourage oral hydration 

## 2021-09-16 NOTE — Assessment & Plan Note (Addendum)
Recommend Tylenol 3 every 6 hours as needed.   RadOnc evaluation

## 2021-09-16 NOTE — Progress Notes (Signed)
PT here for follow up and tx. Pt reports that he feels fatigued.

## 2021-09-16 NOTE — Assessment & Plan Note (Addendum)
#  IgG lamda multiple myeloma On chemotherapy plan with lenalidomide (25 mg daily on days 1-14), subcutaneous bortezomib (1.3 mg/m2 on days 1, 4, 8, and 11), and oral dexamethasone (20 mg on days 1, 2, 8, 9, 15, and 16). SubQ daratumumab on days 1, 8, and 15 of cycles 1 through 4 and day 1 of post-ASCT consolidation cycles (cycles 5 and 6).   Labs are reviewed and discussed with patient. Hold Velcade and Revlimid  due to fatigue, tachycardia, anxiety, see below.  Proceed with Daratumumab injection. Re-evaluate on D4 for Velcade  #Hypoalbuminemia, follow up with nutritionist #Hyperuricemia, continue allopurinol

## 2021-09-16 NOTE — Assessment & Plan Note (Signed)
Etiology unclear, multifactorial Differential Diagnosis:  Tachycardia due to severe anemia, anxiety, underlying heart disease etc Check 2D Echo I discussed about trials of anxiety medication, and patient declines. He agrees with 1 dose of Ativan 0.'5mg'$  prior to blood transfusion, but would decline a prescription of PRN Ativan He will further discuss with his mother and update me if he changes his mind.

## 2021-09-17 ENCOUNTER — Inpatient Hospital Stay: Payer: Commercial Managed Care - PPO

## 2021-09-17 ENCOUNTER — Other Ambulatory Visit: Payer: Self-pay | Admitting: Oncology

## 2021-09-17 ENCOUNTER — Other Ambulatory Visit: Payer: Self-pay

## 2021-09-17 VITALS — BP 115/76 | HR 104 | Temp 96.9°F | Resp 18

## 2021-09-17 DIAGNOSIS — Z5112 Encounter for antineoplastic immunotherapy: Secondary | ICD-10-CM | POA: Diagnosis not present

## 2021-09-17 DIAGNOSIS — C9000 Multiple myeloma not having achieved remission: Secondary | ICD-10-CM

## 2021-09-17 LAB — MULTIPLE MYELOMA PANEL, SERUM
Albumin SerPl Elph-Mcnc: 4 g/dL (ref 2.9–4.4)
Albumin/Glob SerPl: 0.4 — ABNORMAL LOW (ref 0.7–1.7)
Alpha 1: 0.5 g/dL — ABNORMAL HIGH (ref 0.0–0.4)
Alpha2 Glob SerPl Elph-Mcnc: 1.1 g/dL — ABNORMAL HIGH (ref 0.4–1.0)
B-Globulin SerPl Elph-Mcnc: 1.5 g/dL — ABNORMAL HIGH (ref 0.7–1.3)
Gamma Glob SerPl Elph-Mcnc: 7.7 g/dL — ABNORMAL HIGH (ref 0.4–1.8)
Globulin, Total: 10.8 g/dL — ABNORMAL HIGH (ref 2.2–3.9)
IgA: 15 mg/dL — ABNORMAL LOW (ref 90–386)
IgG (Immunoglobin G), Serum: 11612 mg/dL — ABNORMAL HIGH (ref 603–1613)
IgM (Immunoglobulin M), Srm: 7 mg/dL — ABNORMAL LOW (ref 20–172)
M Protein SerPl Elph-Mcnc: 7.6 g/dL — ABNORMAL HIGH
Total Protein ELP: 14.8 g/dL — ABNORMAL HIGH (ref 6.0–8.5)

## 2021-09-17 LAB — BPAM RBC
Blood Product Expiration Date: 202309262359
ISSUE DATE / TIME: 202309121215
Unit Type and Rh: 5100

## 2021-09-17 LAB — TYPE AND SCREEN
ABO/RH(D): O POS
Antibody Screen: NEGATIVE
Unit division: 0

## 2021-09-17 LAB — KAPPA/LAMBDA LIGHT CHAINS
Kappa free light chain: 2.5 mg/L — ABNORMAL LOW (ref 3.3–19.4)
Kappa, lambda light chain ratio: 0 — ABNORMAL LOW (ref 0.26–1.65)
Lambda free light chains: 806.4 mg/L — ABNORMAL HIGH (ref 5.7–26.3)

## 2021-09-17 MED ORDER — MONTELUKAST SODIUM 10 MG PO TABS
10.0000 mg | ORAL_TABLET | Freq: Once | ORAL | Status: AC
  Administered 2021-09-17: 10 mg via ORAL
  Filled 2021-09-17: qty 1

## 2021-09-17 MED ORDER — DARATUMUMAB-HYALURONIDASE-FIHJ 1800-30000 MG-UT/15ML ~~LOC~~ SOLN
1800.0000 mg | Freq: Once | SUBCUTANEOUS | Status: AC
  Administered 2021-09-17: 1800 mg via SUBCUTANEOUS
  Filled 2021-09-17: qty 15

## 2021-09-17 MED ORDER — DIPHENHYDRAMINE HCL 25 MG PO CAPS
50.0000 mg | ORAL_CAPSULE | Freq: Once | ORAL | Status: AC
  Administered 2021-09-17: 50 mg via ORAL
  Filled 2021-09-17: qty 2

## 2021-09-17 MED ORDER — DEXAMETHASONE 4 MG PO TABS: 20.0000 mg | ORAL_TABLET | Freq: Once | ORAL | Status: DC

## 2021-09-17 MED ORDER — DEXAMETHASONE 4 MG PO TABS: 20.0000 mg | ORAL_TABLET | ORAL | 0 refills | Status: DC

## 2021-09-17 MED ORDER — ACETAMINOPHEN 325 MG PO TABS
650.0000 mg | ORAL_TABLET | Freq: Once | ORAL | Status: AC
  Administered 2021-09-17: 650 mg via ORAL
  Filled 2021-09-17: qty 2

## 2021-09-17 NOTE — Addendum Note (Signed)
Addended by: [name] on: 09/17/2021 11:41 AM   Modules accepted: Orders

## 2021-09-17 NOTE — Telephone Encounter (Signed)
Pt already had BMBx on 8/11.

## 2021-09-18 ENCOUNTER — Ambulatory Visit: Payer: Commercial Managed Care - PPO

## 2021-09-19 ENCOUNTER — Encounter: Payer: Self-pay | Admitting: Medical Oncology

## 2021-09-19 ENCOUNTER — Inpatient Hospital Stay (HOSPITAL_BASED_OUTPATIENT_CLINIC_OR_DEPARTMENT_OTHER): Payer: Commercial Managed Care - PPO | Admitting: Medical Oncology

## 2021-09-19 ENCOUNTER — Inpatient Hospital Stay: Payer: Commercial Managed Care - PPO

## 2021-09-19 DIAGNOSIS — C9000 Multiple myeloma not having achieved remission: Secondary | ICD-10-CM

## 2021-09-19 DIAGNOSIS — Z5112 Encounter for antineoplastic immunotherapy: Secondary | ICD-10-CM | POA: Diagnosis not present

## 2021-09-19 DIAGNOSIS — D649 Anemia, unspecified: Secondary | ICD-10-CM

## 2021-09-19 LAB — CBC WITH DIFFERENTIAL/PLATELET
Abs Immature Granulocytes: 0 10*3/uL (ref 0.00–0.07)
Basophils Absolute: 0 10*3/uL (ref 0.0–0.1)
Basophils Relative: 0 %
Eosinophils Absolute: 0 10*3/uL (ref 0.0–0.5)
Eosinophils Relative: 1 %
HCT: 25.8 % — ABNORMAL LOW (ref 39.0–52.0)
Hemoglobin: 8.7 g/dL — ABNORMAL LOW (ref 13.0–17.0)
Immature Granulocytes: 0 %
Lymphocytes Relative: 37 %
Lymphs Abs: 1.3 10*3/uL (ref 0.7–4.0)
MCH: 27.9 pg (ref 26.0–34.0)
MCHC: 33.7 g/dL (ref 30.0–36.0)
MCV: 82.7 fL (ref 80.0–100.0)
Monocytes Absolute: 0.3 10*3/uL (ref 0.1–1.0)
Monocytes Relative: 7 %
Neutro Abs: 1.9 10*3/uL (ref 1.7–7.7)
Neutrophils Relative %: 55 %
Platelets: 171 10*3/uL (ref 150–400)
RBC: 3.12 MIL/uL — ABNORMAL LOW (ref 4.22–5.81)
RDW: 16.1 % — ABNORMAL HIGH (ref 11.5–15.5)
WBC: 3.4 10*3/uL — ABNORMAL LOW (ref 4.0–10.5)
nRBC: 0 % (ref 0.0–0.2)

## 2021-09-19 LAB — TYPE AND SCREEN
ABO/RH(D): O POS
Antibody Screen: NEGATIVE

## 2021-09-19 MED ORDER — BORTEZOMIB CHEMO SQ INJECTION 3.5 MG (2.5MG/ML)
1.3000 mg/m2 | Freq: Once | INTRAMUSCULAR | Status: AC
  Administered 2021-09-19: 3 mg via SUBCUTANEOUS
  Filled 2021-09-19: qty 1.2

## 2021-09-19 MED ORDER — PROCHLORPERAZINE MALEATE 10 MG PO TABS
10.0000 mg | ORAL_TABLET | Freq: Once | ORAL | Status: AC
  Administered 2021-09-19: 10 mg via ORAL
  Filled 2021-09-19: qty 1

## 2021-09-19 NOTE — Progress Notes (Signed)
Hematology/Oncology Progress note [phone] [phone]        REFERRING PROVIDER: [name], MD    ASSESSMENT & PLAN:   Cancer Staging  Multiple myeloma not having achieved remission [facility]) Staging form: Plasma Cell Myeloma and Plasma Cell Disorders, AJCC 8th Edition - Clinical stage from 08/20/2021: Beta-2-microglobulin (mg/L): 3.6, Albumin (g/dL): 2.2, ISS: Stage II, High-risk cytogenetics: Unknown, LDH: Normal - Signed by [name], MD on 08/25/2021   No problem-specific Assessment & Plan notes found for this encounter.  Patient gets dexamethasone 4m on Day 1, 8, 15. Recommend him to take dexamethasone 20 mg, day 2, 9,16.   No orders of the defined types were placed in this encounter.  Glad to hear he is doing well. Labs reviewed and are acceptable for treatment with Velcade. Reviewed with Dr. YTasia Catchingswho agrees. He will follow up next week for on going management and monitoring   Follow up Velcade today RTC next week with Dr. YTasia Catchings   A total of 15 minutes was spent on this visit.  With 5 minutes spent reviewing labs, 5 minutes counseling the patient on chemotherapy treatments, side effects of the treatment, management of symptoms.  Additional 5 minutes was spent on answering patient's questions.    All questions were answered. The patient knows to call the clinic with any problems, questions or concerns. No barriers to learning was detected.  SHughie Closs PA-C 09/19/2021   CHIEF COMPLAINTS/PURPOSE OF CONSULTATION:  Multiple myeloma  HISTORY OF PRESENTING ILLNESS:  Tanner Thielen89y.o. male presents  for follow up of Multiple myeloma I have reviewed his chart and materials related to his cancer extensively and collaborated history with the patient. Summary of oncologic history is as follows: Oncology History  Multiple myeloma not having achieved remission ([facility]  08/14/2021 Imaging   CT chest abdomen pelvis without contrast Showed  numerous lucencies throughout the bone structures, most pronounced throughout the spine and left left iliac bone.  Otherwise no acute findings in the chest abdomen or pelvis.  Sigmoid diverticulosis.   08/19/2021 Initial Diagnosis   Multiple myeloma not having achieved remission, stage II  -08/15/2021, bone marrow biopsy showed plasma cell myeloma involving greater than 80% of the marrow. Cytogenetics Myeloma  FISH   08/20/2021 Cancer Staging   Staging form: Plasma Cell Myeloma and Plasma Cell Disorders, AJCC 8th Edition - Clinical stage from 08/20/2021: Beta-2-microglobulin (mg/L): 3.6, Albumin (g/dL): 2.2, ISS: Stage II, High-risk cytogenetics: Unknown, LDH: Normal - Signed by YEarlie Server MD on 08/25/2021 Beta 2 microglobulin range (mg/L): 3.5 to 5.49 Albumin range (g/dL): Less than 3.5 Cytogenetics: Unknown   08/20/2021 -  Chemotherapy   MYELOMA NEWLY DIAGNOSED TRANSPLANT CANDIDATE DaraVRd (Daratumumab SUBQ) q21d x 6 Cycles (Induction/Consolidation)        INTERVAL HISTORY CRoen Macgowanis a 36y.o. male who has above history reviewed by me today presents for follow up visit for management of multiple myeloma  He feels much better today. Less palpitations, less chest pain, less fatigue. Feels better with each treatment. Tolerating treatment well. Has cardiology appointment on the 20th of this month. Has dental clearance appointment on the 25th.   MEDICAL HISTORY:  Past Medical History:  Diagnosis Date   Alcohol use    Sciatica    Smoking     SURGICAL HISTORY: Past Surgical History:  Procedure Laterality Date   INGUINAL HERNIA REPAIR      SOCIAL HISTORY: Social History   Socioeconomic History   Marital  status: Single    Spouse name: Not on file   Number of children: Not on file   Years of education: Not on file   Highest education level: Not on file  Occupational History   Not on file  Tobacco Use   Smoking status: Former    Packs/day: 0.50    Years: 10.00     Total pack years: 5.00    Types: E-cigarettes, Cigarettes    Quit date: 08/11/2021    Years since quitting: 0.1   Smokeless tobacco: Never  Vaping Use   Vaping Use: Every day  Substance and Sexual Activity   Alcohol use: Not Currently    Alcohol/week: 24.0 standard drinks of alcohol    Types: 24 Cans of beer per week    Comment: quit drinking approx 08/11/2021   Drug use: Never   Sexual activity: Not on file  Other Topics Concern   Not on file  Social History Narrative   Not on file   Social Determinants of Health   Financial Resource Strain: Not on file  Food Insecurity: Not on file  Transportation Needs: Not on file  Physical Activity: Not on file  Stress: Not on file  Social Connections: Not on file  Intimate Partner Violence: Not on file    FAMILY HISTORY: Family History  Problem Relation Age of Onset   Ovarian cancer Mother    Asthma Mother    Alcoholism Father    Throat cancer Maternal Uncle    Heart disease Maternal Uncle    Lung cancer Maternal Uncle    Lymphoma Maternal Uncle    Lung cancer Paternal Grandmother     ALLERGIES:  has No Known Allergies.  MEDICATIONS:  Current Outpatient Medications  Medication Sig Dispense Refill   acetaminophen-codeine (TYLENOL #3) 300-30 MG tablet Take 1 tablet by mouth every 6 (six) hours as needed for moderate pain. 30 tablet 0   acyclovir (ZOVIRAX) 400 MG tablet Take 1 tablet (400 mg total) by mouth 2 (two) times daily. 60 tablet 5   allopurinol (ZYLOPRIM) 100 MG tablet Take 0.5 tablets (50 mg total) by mouth daily. 30 tablet 2   dexamethasone (DECADRON) 4 MG tablet Take 5 tablets (20 mg total) by mouth See admin instructions. take dexamethasone 20 mg, day 2, 9,16. 60 tablet 0   ondansetron (ZOFRAN) 8 MG tablet Take 1 tablet (8 mg total) by mouth 2 (two) times daily as needed for nausea or vomiting. 20 tablet 2   prochlorperazine (COMPAZINE) 10 MG tablet Take 1 tablet (10 mg total) by mouth every 6 (six) hours as needed  for nausea or vomiting. 30 tablet 2   REVLIMID 25 MG capsule TAKE 1 CAPSULE BY MOUTH ONCE DAILY FOR 14 DAYS ON AND 7 DAYS OFF 14 capsule 0   aspirin EC 81 MG tablet Take 1 tablet (81 mg total) by mouth daily. Swallow whole. (Patient not taking: Reported on 09/16/2021) 90 tablet 11   No current facility-administered medications for this visit.    Review of Systems  Constitutional:  Negative for appetite change, chills, fatigue, fever and unexpected weight change.  HENT:   Negative for hearing loss and voice change.   Eyes:  Negative for eye problems and icterus.  Respiratory:  Negative for chest tightness, cough and shortness of breath.   Cardiovascular:  Negative for chest pain and leg swelling.  Gastrointestinal:  Negative for abdominal distention and abdominal pain.  Endocrine: Negative for hot flashes.  Genitourinary:  Negative for difficulty urinating,  dysuria and frequency.   Musculoskeletal:  Negative for arthralgias.  Skin:  Negative for itching and rash.  Neurological:  Negative for light-headedness and numbness.  Hematological:  Negative for adenopathy. Does not bruise/bleed easily.  Psychiatric/Behavioral:  Negative for confusion.      PHYSICAL EXAMINATION: ECOG PERFORMANCE STATUS: 1 - Symptomatic but completely ambulatory  Vitals:   09/19/21 1315  BP: 129/82  Pulse: (!) 106  Temp: 98.4 F (36.9 C)  SpO2: 100%   Filed Weights   09/19/21 1315  Weight: 218 lb (98.9 kg)    Physical Exam Constitutional:      General: He is not in acute distress.    Appearance: He is obese. He is not diaphoretic.  HENT:     Head: Normocephalic and atraumatic.     Nose: Nose normal.     Mouth/Throat:     Pharynx: No oropharyngeal exudate.  Eyes:     General: No scleral icterus.    Pupils: Pupils are equal, round, and reactive to light.  Cardiovascular:     Rate and Rhythm: Normal rate and regular rhythm.     Heart sounds: No murmur heard. Pulmonary:     Effort: Pulmonary  effort is normal. No respiratory distress.     Breath sounds: No rales.  Chest:     Chest wall: No tenderness.  Abdominal:     General: There is no distension.     Palpations: Abdomen is soft.     Tenderness: There is no abdominal tenderness.  Musculoskeletal:        General: Normal range of motion.     Cervical back: Normal range of motion and neck supple.  Skin:    General: Skin is warm and dry.     Findings: No erythema.  Neurological:     Mental Status: He is alert and oriented to person, place, and time.     Cranial Nerves: No cranial nerve deficit.     Motor: No abnormal muscle tone.     Coordination: Coordination normal.  Psychiatric:        Mood and Affect: Affect normal.      LABORATORY DATA:  I have reviewed the data as listed    Latest Ref Rng & Units 09/19/2021    1:02 PM 09/16/2021    8:56 AM 09/09/2021    8:44 AM  CBC  WBC 4.0 - 10.5 K/uL 3.4  3.4  4.8   Hemoglobin 13.0 - 17.0 g/dL 8.7  8.0  8.4   Hematocrit 39.0 - 52.0 % 25.8  23.1  24.3   Platelets 150 - 400 K/uL 171  121  152       Latest Ref Rng & Units 09/16/2021    8:56 AM 09/09/2021    8:44 AM 09/01/2021   12:52 PM  CMP  Glucose 70 - 99 mg/dL 111  95  91   BUN 6 - 20 mg/dL _0 Creatinine 0.61 - 1.24 mg/dL 1.36  1.80  1.56   Sodium 135 - 145 mmol/L 126  122  127   Potassium 3.5 - 5.1 mmol/L 3.6  4.1  3.7   Chloride 98 - 111 mmol/L 104  97  103   CO2 22 - 32 mmol/L _1 Calcium 8.9 - 10.3 mg/dL 8.2  9.4  7.8   Total Protein 6.5 - 8.1 g/dL >12.0  >12.0  >12.0   Total Bilirubin 0.3 - 1.2 mg/dL 0.6  0.9  0.5   Alkaline Phos 38 - 126 U/L 79  94  105   AST 15 - 41 U/L 22  24  44   ALT 0 - 44 U/L 45  79  145        RADIOGRAPHIC STUDIES: I have personally reviewed the radiological images as listed and agreed with the findings in the report. NM PET Image Initial (PI) Whole Body  Result Date: 09/11/2021 CLINICAL DATA:  Initial treatment strategy for multiple myeloma. EXAM: NUCLEAR  MEDICINE PET WHOLE BODY TECHNIQUE: 12.5 mCi F-18 FDG was injected intravenously. Full-ring PET imaging was performed from the head to foot after the radiotracer. CT data was obtained and used for attenuation correction and anatomic localization. Fasting blood glucose: 99 mg/dl COMPARISON:  CT August 14, 2021 FINDINGS: Mediastinal blood pool activity: SUV max 2.11 HEAD/NECK: Hypermetabolic activity in the clivus, occipital condyles, and left maxilla. For reference lucency throughout the clivus with max SUV of 13.1. There is no hypermetabolic cervical lymph nodes. Incidental CT findings: none CHEST: No hypermetabolic mediastinal or hilar nodes. No suspicious pulmonary nodules on the CT scan. Incidental CT findings: none ABDOMEN/PELVIS: No abnormal hypermetabolic activity within the liver, pancreas, adrenal glands, or spleen. No hypermetabolic lymph nodes in the abdomen or pelvis. Incidental CT findings: Multifocal soft tissue subcutaneous throughout the anterior abdominal wall do not demonstrate significant abnormal FDG avidity and most likely reflects sequela of subcutaneous injections. SKELETON: Extensive hypermetabolic osseous activity with multifocal lucent osseous lesions throughout the visualized axial and appendicular skeleton. For reference: - max SUV in the T12 vertebral body is 10.7 - max SUV in the right hemisacrum of 11.05. -large lucent lesion in the left iliac bone which demonstrates cortical break on image 98/2 with a max SUV of 11.2. Incidental CT findings: none EXTREMITIES: Hypermetabolic foci in the bilateral femurs for instance in the left femur max SUV of 12.9. Incidental CT findings: none IMPRESSION: 1. Extensive multifocal hypermetabolic disease throughout the axial and appendicular skeleton, with numerous lucent osseous lesions, consistent with patient's known diagnosis of multiple myeloma. 2. No tracer avid lymph node or mass identified to suggest soft tissue plasmacytoma Electronically Signed    By: Dahlia Bailiff M.D.   On: 09/11/2021 11:51

## 2021-09-19 NOTE — Patient Instructions (Addendum)
[facility] CANCER CTR AT [place]  Discharge Instructions: Thank you for choosing [facility] to provide your oncology and hematology care.  If you have a lab appointment with the [facility], please go directly to the [facility] and check in at the registration area.  Wear comfortable clothing and clothing appropriate for easy access to any Portacath or PICC line.   We strive to give you quality time with your provider. You may need to reschedule your appointment if you arrive late (15 or more minutes).  Arriving late affects you and other patients whose appointments are after yours.  Also, if you miss three or more appointments without notifying the office, you may be dismissed from the clinic at the provider's discretion.      For prescription refill requests, have your pharmacy contact our office and allow 72 hours for refills to be completed.    Today you received the following chemotherapy and/or immunotherapy agents VELCADE      To help prevent nausea and vomiting after your treatment, we encourage you to take your nausea medication as directed.  BELOW ARE SYMPTOMS THAT SHOULD BE REPORTED IMMEDIATELY: *FEVER GREATER THAN 100.4 F (38 C) OR HIGHER *CHILLS OR SWEATING *NAUSEA AND VOMITING THAT IS NOT CONTROLLED WITH YOUR NAUSEA MEDICATION *UNUSUAL SHORTNESS OF BREATH *UNUSUAL BRUISING OR BLEEDING *URINARY PROBLEMS (pain or burning when urinating, or frequent urination) *BOWEL PROBLEMS (unusual diarrhea, constipation, pain near the anus) TENDERNESS IN MOUTH AND THROAT WITH OR WITHOUT PRESENCE OF ULCERS (sore throat, sores in mouth, or a toothache) UNUSUAL RASH, SWELLING OR PAIN  UNUSUAL VAGINAL DISCHARGE OR ITCHING   Items with * indicate a potential emergency and should be followed up as soon as possible or go to the Emergency Department if any problems should occur.  Please show the CHEMOTHERAPY ALERT CARD or IMMUNOTHERAPY ALERT CARD at check-in to the  Emergency Department and triage nurse.  Should you have questions after your visit or need to cancel or reschedule your appointment, please contact [facility] CANCER [place] AT [place]  [phone] and follow the prompts.  Office hours are 8:00 a.m. to 4:30 p.m. Monday - Friday. Please note that voicemails left after 4:00 p.m. may not be returned until the following business day.  We are closed weekends and major holidays. You have access to a nurse at all times for urgent questions. Please call the main number to the clinic [phone] and follow the prompts.  For any non-urgent questions, you may also contact your provider using MyChart. We now offer e-Visits for anyone 64 and older to request care online for non-urgent symptoms. For details visit [email or web address].   Also download the MyChart app! Go to the app store, search "MyChart", open the app, select [place], and log in with your MyChart username and password.  Masks are optional in the cancer centers. If you would like for your care team to wear a mask while they are taking care of you, please let them know. For doctor visits, patients may have with them one support person who is at least 35 years old. At this time, visitors are not allowed in the infusion area. Bortezomib Injection What is this medication? BORTEZOMIB (bor TEZ oh mib) treats lymphoma. It may also be used to treat multiple myeloma, a type of bone marrow cancer. It works by blocking a protein that causes cancer cells to grow and multiply. This helps to slow or stop the spread of cancer cells. This medicine may be  used for other purposes; ask your health care provider or pharmacist if you have questions. COMMON BRAND NAME(S): Velcade What should I tell my care team before I take this medication? They need to know if you have any of these conditions: Dehydration Diabetes Heart disease Liver disease Tingling of the fingers or toes or other nerve  disorder An unusual or allergic reaction to bortezomib, other medications, foods, dyes, or preservatives If you or your partner are pregnant or trying to get pregnant Breastfeeding How should I use this medication? This medication is injected into a vein or under the skin. It is given by your care team in a hospital or clinic setting. Talk to your care team about the use of this medication in children. Special care may be needed. Overdosage: If you think you have taken too much of this medicine contact a poison control center or emergency room at once. NOTE: This medicine is only for you. Do not share this medicine with others. What if I miss a dose? Keep appointments for follow-up doses. It is important not to miss your dose. Call your care team if you are unable to keep an appointment. What may interact with this medication? Ketoconazole Rifampin This list may not describe all possible interactions. Give your health care provider a list of all the medicines, herbs, non-prescription drugs, or dietary supplements you use. Also tell them if you smoke, drink alcohol, or use illegal drugs. Some items may interact with your medicine. What should I watch for while using this medication? Your condition will be monitored carefully while you are receiving this medication. You may need blood work while taking this medication. This medication may affect your coordination, reaction time, or judgment. Do not drive or operate machinery until you know how this medication affects you. Sit up or stand slowly to reduce the risk of dizzy or fainting spells. Drinking alcohol with this medication can increase the risk of these side effects. This medication may increase your risk of getting an infection. Call your care team for advice if you get a fever, chills, sore throat, or other symptoms of a cold or flu. Do not treat yourself. Try to avoid being around people who are sick. Check with your care team if you have  severe diarrhea, nausea, and vomiting, or if you sweat a lot. The loss of too much body fluid may make it dangerous for you to take this medication. Talk to your care team if you may be pregnant. Serious birth defects can occur if you take this medication during pregnancy and for 7 months after the last dose. You will need a negative pregnancy test before starting this medication. Contraception is recommended while taking this medication and for 7 months after the last dose. Your care team can help you find the option that works for you. If your partner can get pregnant, use a condom during sex while taking this medication and for 4 months after the last dose. Do not breastfeed while taking this medication and for 2 months after the last dose. This medication may cause infertility. Talk to your care team if you are concerned about your fertility. What side effects may I notice from receiving this medication? Side effects that you should report to your care team as soon as possible: Allergic reactions--skin rash, itching, hives, swelling of the face, lips, tongue, or throat Bleeding--bloody or black, tar-like stools, vomiting blood or brown material that looks like coffee grounds, red or dark brown urine, small red  or purple spots on skin, unusual bruising or bleeding Bleeding in the brain--severe headache, stiff neck, confusion, dizziness, change in vision, numbness or weakness of the face, arm, or leg, trouble speaking, trouble walking, vomiting Bowel blockage--stomach cramping, unable to have a bowel movement or pass gas, loss of appetite, vomiting Heart failure--shortness of breath, swelling of the ankles, feet, or hands, sudden weight gain, unusual weakness or fatigue Infection--fever, chills, cough, sore throat, wounds that don't heal, pain or trouble when passing urine, general feeling of discomfort or being unwell Liver injury--right upper belly pain, loss of appetite, nausea, light-colored  stool, dark yellow or brown urine, yellowing skin or eyes, unusual weakness or fatigue Low blood pressure--dizziness, feeling faint or lightheaded, blurry vision Lung injury--shortness of breath or trouble breathing, cough, spitting up blood, chest pain, fever Pain, tingling, or numbness in the hands or feet Severe or prolonged diarrhea Stomach pain, bloody diarrhea, pale skin, unusual weakness or fatigue, decrease in the amount of urine, which may be signs of hemolytic uremic syndrome Sudden and severe headache, confusion, change in vision, seizures, which may be signs of posterior reversible encephalopathy syndrome (PRES) TTP--purple spots on the skin or inside the mouth, pale skin, yellowing skin or eyes, unusual weakness or fatigue, fever, fast or irregular heartbeat, confusion, change in vision, trouble speaking, trouble walking Tumor lysis syndrome (TLS)--nausea, vomiting, diarrhea, decrease in the amount of urine, dark urine, unusual weakness or fatigue, confusion, muscle pain or cramps, fast or irregular heartbeat, joint pain Side effects that usually do not require medical attention (report to your care team if they continue or are bothersome): Constipation Diarrhea Fatigue Loss of appetite Nausea This list may not describe all possible side effects. Call your doctor for medical advice about side effects. You may report side effects to FDA at 1-800-FDA-1088. Where should I keep my medication? This medication is given in a hospital or clinic. It will not be stored at home. NOTE: This sheet is a summary. It may not cover all possible information. If you have questions about this medicine, talk to your doctor, pharmacist, or health care provider.  2023 Elsevier/Gold Standard (2021-05-21 00:00:00)  [facility] CANCER CTR AT [place] ONCOLOGY  Discharge Instructions: Thank you for choosing [facility] to provide your oncology and hematology care.  If you have a lab  appointment with the [facility], please go directly to the [place] and check in at the registration area.  Wear comfortable clothing and clothing appropriate for easy access to any Portacath or PICC line.   We strive to give you quality time with your provider. You may need to reschedule your appointment if you arrive late (15 or more minutes).  Arriving late affects you and other patients whose appointments are after yours.  Also, if you miss three or more appointments without notifying the office, you may be dismissed from the clinic at the provider's discretion.      For prescription refill requests, have your pharmacy contact our office and allow 72 hours for refills to be completed.    Today you received the following chemotherapy and/or immunotherapy agents Vidaza.      To help prevent nausea and vomiting after your treatment, we encourage you to take your nausea medication as directed.  BELOW ARE SYMPTOMS THAT SHOULD BE REPORTED IMMEDIATELY: *FEVER GREATER THAN 100.4 F (38 C) OR HIGHER *CHILLS OR SWEATING *NAUSEA AND VOMITING THAT IS NOT CONTROLLED WITH YOUR NAUSEA MEDICATION *UNUSUAL SHORTNESS OF BREATH *UNUSUAL BRUISING OR BLEEDING *URINARY  PROBLEMS (pain or burning when urinating, or frequent urination) *BOWEL PROBLEMS (unusual diarrhea, constipation, pain near the anus) TENDERNESS IN MOUTH AND THROAT WITH OR WITHOUT PRESENCE OF ULCERS (sore throat, sores in mouth, or a toothache) UNUSUAL RASH, SWELLING OR PAIN  UNUSUAL VAGINAL DISCHARGE OR ITCHING   Items with * indicate a potential emergency and should be followed up as soon as possible or go to the Emergency Department if any problems should occur.  Please show the CHEMOTHERAPY ALERT CARD or IMMUNOTHERAPY ALERT CARD at check-in to the Emergency Department and triage nurse.  Should you have questions after your visit or need to cancel or reschedule your appointment, please contact [facility] CANCER [place] AT [place]  [phone] and follow the prompts.  Office hours are 8:00 a.m. to 4:30 p.m. Monday - Friday. Please note that voicemails left after 4:00 p.m. may not be returned until the following business day.  We are closed weekends and major holidays. You have access to a nurse at all times for urgent questions. Please call the main number to the clinic [phone] and follow the prompts.  For any non-urgent questions, you may also contact your provider using MyChart. We now offer e-Visits for anyone 1 and older to request care online for non-urgent symptoms. For details visit [email or web address].   Also download the MyChart app! Go to the app store, search "MyChart", open the app, select [place], and log in with your MyChart username and password.  Masks are optional in the cancer centers. If you would like for your care team to wear a mask while they are taking care of you, please let them know. For doctor visits, patients may have with them one support person who is at least 35 years old. At this time, visitors are not allowed in the infusion area.

## 2021-09-20 ENCOUNTER — Other Ambulatory Visit: Payer: Self-pay

## 2021-09-22 ENCOUNTER — Encounter: Payer: Self-pay | Admitting: Oncology

## 2021-09-23 ENCOUNTER — Encounter: Payer: Self-pay | Admitting: Oncology

## 2021-09-23 ENCOUNTER — Other Ambulatory Visit: Payer: Commercial Managed Care - PPO

## 2021-09-23 ENCOUNTER — Inpatient Hospital Stay: Payer: Commercial Managed Care - PPO

## 2021-09-23 ENCOUNTER — Ambulatory Visit: Payer: Commercial Managed Care - PPO | Admitting: Oncology

## 2021-09-23 ENCOUNTER — Inpatient Hospital Stay (HOSPITAL_BASED_OUTPATIENT_CLINIC_OR_DEPARTMENT_OTHER): Payer: Commercial Managed Care - PPO | Admitting: Oncology

## 2021-09-23 ENCOUNTER — Other Ambulatory Visit: Payer: Self-pay

## 2021-09-23 ENCOUNTER — Other Ambulatory Visit: Payer: Self-pay | Admitting: Oncology

## 2021-09-23 ENCOUNTER — Ambulatory Visit: Payer: Commercial Managed Care - PPO

## 2021-09-23 VITALS — BP 127/76 | HR 102 | Temp 97.6°F | Resp 18 | Wt 220.6 lb

## 2021-09-23 DIAGNOSIS — M545 Low back pain, unspecified: Secondary | ICD-10-CM

## 2021-09-23 DIAGNOSIS — C9000 Multiple myeloma not having achieved remission: Secondary | ICD-10-CM

## 2021-09-23 DIAGNOSIS — Z5111 Encounter for antineoplastic chemotherapy: Secondary | ICD-10-CM | POA: Diagnosis not present

## 2021-09-23 DIAGNOSIS — N189 Chronic kidney disease, unspecified: Secondary | ICD-10-CM | POA: Diagnosis not present

## 2021-09-23 DIAGNOSIS — Z5112 Encounter for antineoplastic immunotherapy: Secondary | ICD-10-CM | POA: Diagnosis not present

## 2021-09-23 DIAGNOSIS — D649 Anemia, unspecified: Secondary | ICD-10-CM

## 2021-09-23 DIAGNOSIS — R000 Tachycardia, unspecified: Secondary | ICD-10-CM

## 2021-09-23 DIAGNOSIS — C7951 Secondary malignant neoplasm of bone: Secondary | ICD-10-CM | POA: Insufficient documentation

## 2021-09-23 LAB — COMPREHENSIVE METABOLIC PANEL
ALT: 26 U/L (ref 0–44)
AST: 22 U/L (ref 15–41)
Albumin: 2.4 g/dL — ABNORMAL LOW (ref 3.5–5.0)
Alkaline Phosphatase: 95 U/L (ref 38–126)
Anion gap: 2 — ABNORMAL LOW (ref 5–15)
BUN: 16 mg/dL (ref 6–20)
CO2: 24 mmol/L (ref 22–32)
Calcium: 8.2 mg/dL — ABNORMAL LOW (ref 8.9–10.3)
Chloride: 104 mmol/L (ref 98–111)
Creatinine, Ser: 1.03 mg/dL (ref 0.61–1.24)
GFR, Estimated: >60 mL/min (ref >60)
Glucose, Bld: 120 mg/dL — ABNORMAL HIGH (ref 70–99)
Potassium: 3.2 mmol/L — ABNORMAL LOW (ref 3.5–5.1)
Sodium: 130 mmol/L — ABNORMAL LOW (ref 135–145)
Total Bilirubin: 0.3 mg/dL (ref 0.3–1.2)
Total Protein: 10.8 g/dL — ABNORMAL HIGH (ref 6.5–8.1)

## 2021-09-23 LAB — CBC WITH DIFFERENTIAL/PLATELET
Abs Immature Granulocytes: 0 10*3/uL (ref 0.00–0.07)
Basophils Absolute: 0 10*3/uL (ref 0.0–0.1)
Basophils Relative: 1 %
Eosinophils Absolute: 0 10*3/uL (ref 0.0–0.5)
Eosinophils Relative: 2 %
HCT: 24.7 % — ABNORMAL LOW (ref 39.0–52.0)
Hemoglobin: 8.4 g/dL — ABNORMAL LOW (ref 13.0–17.0)
Immature Granulocytes: 0 %
Lymphocytes Relative: 42 %
Lymphs Abs: 0.9 10*3/uL (ref 0.7–4.0)
MCH: 27.8 pg (ref 26.0–34.0)
MCHC: 34 g/dL (ref 30.0–36.0)
MCV: 81.8 fL (ref 80.0–100.0)
Monocytes Absolute: 0.2 10*3/uL (ref 0.1–1.0)
Monocytes Relative: 9 %
Neutro Abs: 1 10*3/uL — ABNORMAL LOW (ref 1.7–7.7)
Neutrophils Relative %: 46 %
Platelets: 196 10*3/uL (ref 150–400)
RBC: 3.02 MIL/uL — ABNORMAL LOW (ref 4.22–5.81)
RDW: 15.4 % (ref 11.5–15.5)
WBC: 2.1 10*3/uL — ABNORMAL LOW (ref 4.0–10.5)
nRBC: 0 % (ref 0.0–0.2)

## 2021-09-23 LAB — PREPARE RBC (CROSSMATCH)

## 2021-09-23 MED ORDER — DEXAMETHASONE 4 MG PO TABS
20.0000 mg | ORAL_TABLET | Freq: Once | ORAL | Status: AC
  Administered 2021-09-23: 20 mg via ORAL
  Filled 2021-09-23: qty 5

## 2021-09-23 MED ORDER — POTASSIUM CHLORIDE CRYS ER 20 MEQ PO TBCR: 20.0000 meq | EXTENDED_RELEASE_TABLET | Freq: Every day | ORAL | 0 refills | Status: DC

## 2021-09-23 MED ORDER — DARATUMUMAB-HYALURONIDASE-FIHJ 1800-30000 MG-UT/15ML ~~LOC~~ SOLN
1800.0000 mg | Freq: Once | SUBCUTANEOUS | Status: AC
  Administered 2021-09-23: 1800 mg via SUBCUTANEOUS
  Filled 2021-09-23: qty 15

## 2021-09-23 MED ORDER — MONTELUKAST SODIUM 10 MG PO TABS
10.0000 mg | ORAL_TABLET | Freq: Once | ORAL | Status: AC
  Administered 2021-09-23: 10 mg via ORAL
  Filled 2021-09-23: qty 1

## 2021-09-23 MED ORDER — DIPHENHYDRAMINE HCL 25 MG PO CAPS
50.0000 mg | ORAL_CAPSULE | Freq: Once | ORAL | Status: AC
  Administered 2021-09-23: 50 mg via ORAL
  Filled 2021-09-23: qty 2

## 2021-09-23 MED ORDER — ACETAMINOPHEN 325 MG PO TABS
650.0000 mg | ORAL_TABLET | Freq: Once | ORAL | Status: AC
  Administered 2021-09-23: 650 mg via ORAL
  Filled 2021-09-23: qty 2

## 2021-09-23 MED ORDER — BORTEZOMIB CHEMO SQ INJECTION 3.5 MG (2.5MG/ML)
1.3000 mg/m2 | Freq: Once | INTRAMUSCULAR | Status: AC
  Administered 2021-09-23: 3 mg via SUBCUTANEOUS
  Filled 2021-09-23: qty 1.2

## 2021-09-23 NOTE — Assessment & Plan Note (Signed)
Etiology unclear, multifactorial Differential Diagnosis:  Tachycardia due to severe anemia, anxiety, underlying heart disease etc Check 2D Echo

## 2021-09-23 NOTE — Assessment & Plan Note (Addendum)
#  IgG lamda multiple myeloma On chemotherapy plan with lenalidomide (25 mg daily on days 1-14), subcutaneous bortezomib (1.3 mg/m2 on days 1, 4, 8, and 11), and oral dexamethasone (20 mg on days 1, 2, 8, 9, 15, and 16). SubQ daratumumab on days 1, 8, and 15 of cycles 1 through 4 and day 1 of post-ASCT consolidation cycles (cycles 5 and 6).   Labs are reviewed and discussed with patient. Proceed with Daratumumab- Velcade, patient starts Revlimid 25 mg daily, recommend for 1 week. Take Aspirin $RemoveBefo'81mg'wLLRSAdrrNP$  daily D11 Velcade treatment  #Hypoalbuminemia, follow up with nutritionist #Hyperuricemia, continue allopurinol

## 2021-09-23 NOTE — Assessment & Plan Note (Signed)
Multifactorial, myeloma marrow involvement, chemotherapy Repeat Iron panel today.  He is symptomatic with tachycardia, fatigue, mild SOB.  Recommend 1 unit of PRBC transfusion

## 2021-09-23 NOTE — Assessment & Plan Note (Addendum)
Recommend Tylenol 3 every 6 hours as needed.

## 2021-09-23 NOTE — Assessment & Plan Note (Signed)
Avoid nephrotoxins, encourage oral hydration 

## 2021-09-23 NOTE — Assessment & Plan Note (Addendum)
PET scan showed multiple osseous lesions.  Recommend dental clearance for bone strengthening agents.  Currently pain is well controlled. Refer to surgery for evaluation of need of prophylactic stabilization of bone lesions.

## 2021-09-23 NOTE — Assessment & Plan Note (Signed)
Chemotherapy plan as listed above 

## 2021-09-23 NOTE — Progress Notes (Signed)
Hematology/Oncology Progress note [phone] [phone]        REFERRING PROVIDER: [name], MD    ASSESSMENT & PLAN:   Cancer Staging  Multiple myeloma not having achieved remission [facility]) Staging form: Plasma Cell Myeloma and Plasma Cell Disorders, AJCC 8th Edition - Clinical stage from 08/20/2021: Beta-2-microglobulin (mg/L): 3.6, Albumin (g/dL): 2.2, ISS: Stage II, High-risk cytogenetics: Unknown, LDH: Normal - Signed by [name], MD on 08/25/2021   Multiple myeloma not having achieved remission ([place]) # IgG lamda multiple myeloma On chemotherapy plan with lenalidomide (25 mg daily on days 1-14), subcutaneous bortezomib (1.3 mg/m2 on days 1, 4, 8, and 11), and oral dexamethasone (20 mg on days 1, 2, 8, 9, 15, and 16). SubQ daratumumab on days 1, 8, and 15 of cycles 1 through 4 and day 1 of post-ASCT consolidation cycles (cycles 5 and 6).   Labs are reviewed and discussed with patient. Proceed with Daratumumab- Velcade, patient starts Revlimid 25 mg daily, recommend for 1 week. Take Aspirin 39m daily D11 Velcade treatment  #Hypoalbuminemia, follow up with nutritionist #Hyperuricemia, continue allopurinol  Encounter for antineoplastic chemotherapy Chemotherapy plan as listed above.   CKD (chronic kidney disease) Avoid nephrotoxins, encourage oral hydration  Back pain Recommend Tylenol 3 every 6 hours as needed.     Metastatic cancer to bone ([facility] PET scan showed multiple osseous lesions.  Recommend dental clearance for bone strengthening agents.  Currently pain is well controlled. Refer to surgery for evaluation of need of prophylactic stabilization of bone lesions.   Symptomatic anemia Multifactorial, myeloma marrow involvement, chemotherapy Repeat Iron panel today.  He is symptomatic with tachycardia, fatigue, mild SOB.  Recommend 1 unit of PRBC transfusion   Tachycardia Etiology unclear, multifactorial Differential Diagnosis:   Tachycardia due to severe anemia, anxiety, underlying heart disease etc Check 2D Echo   Recommend patient to obtain dental clearance Patient gets dexamethasone 232mon Day 1, 8, 15. Recommend him to take dexamethasone 20 mg, day 2, 9,16.   Orders Placed This Encounter  Procedures   Type and screen    Standing Status:   Future    Number of Occurrences:   1    Standing Expiration Date:   09/23/2022   Follow up  1 week lab MD Dara-  All questions were answered. The patient knows to call the clinic with any problems, questions or concerns. No barriers to learning was detected.  ZhEarlie ServerMD 09/23/2021   CHIEF COMPLAINTS/PURPOSE OF CONSULTATION:  Multiple myeloma  HISTORY OF PRESENTING ILLNESS:  Tanner Bennett89.o. male presents  for follow up of Multiple myeloma I have reviewed his chart and materials related to his cancer extensively and collaborated history with the patient. Summary of oncologic history is as follows: Oncology History  [facility] not having achieved remission [facility] 08[facility] Imaging   CT chest abdomen pelvis without contrast Showed numerous lucencies throughout the bone structures, most pronounced throughout the spine and left left iliac bone.  Otherwise no acute findings in the chest abdomen or pelvis.  Sigmoid diverticulosis.   08/19/2021 Initial Diagnosis   Multiple myeloma not having achieved remission, stage II  -08/15/2021, bone marrow biopsy showed plasma cell myeloma involving greater than 80% of the marrow. Cytogenetics Myeloma  FISH   08/20/2021 Cancer Staging   Staging form: Plasma Cell Myeloma and Plasma Cell Disorders, AJCC 8th Edition - Clinical stage from 08/20/2021: Beta-2-microglobulin (mg/L): 3.6, Albumin (g/dL): 2.2, ISS: Stage II, High-risk cytogenetics: Unknown, LDH: Normal -  Signed by [name], MD on 08/25/2021 Beta 2 microglobulin range (mg/L): 3.5 to 5.49 Albumin range (g/dL): Less than 3.5 Cytogenetics: Unknown    08/20/2021 -  Chemotherapy   MYELOMA NEWLY DIAGNOSED TRANSPLANT CANDIDATE DaraVRd (Daratumumab SUBQ) q21d x 6 Cycles (Induction/Consolidation)        INTERVAL HISTORY Tanner [name] is a 36 y.o. male who has above history reviewed by me today presents for follow up visit for management of multiple myeloma Denies any neuropathy symptoms. + acute on chronic lower back pain, stable symptoms.  He feels the Tylenol 3 is controlling his symptoms. Patient f has fatigue.  Slightly improved compared to last week.  Anxiety, manageable.  He declines anxiety medications. No nausea vomiting diarrhea.  Denies numbness and tingling  MEDICAL HISTORY:  Past Medical History:  Diagnosis Date   Alcohol use    Sciatica    Smoking     SURGICAL HISTORY: Past Surgical History:  Procedure Laterality Date   INGUINAL HERNIA REPAIR      SOCIAL HISTORY: Social History   Socioeconomic History   Marital status: Single    Spouse name: Not on file   Number of children: Not on file   Years of education: Not on file   Highest education level: Not on file  Occupational History   Not on file  Tobacco Use   Smoking status: Former    Packs/day: 0.50    Years: 10.00    Total pack years: 5.00    Types: E-cigarettes, Cigarettes    Quit date: 08/11/2021    Years since quitting: 0.1   Smokeless tobacco: Never  Vaping Use   Vaping Use: Every day  Substance and Sexual Activity   Alcohol use: Not Currently    Alcohol/week: 24.0 standard drinks of alcohol    Types: 24 Cans of beer per week    Comment: quit drinking approx 08/11/2021   Drug use: Never   Sexual activity: Not on file  Other Topics Concern   Not on file  Social History Narrative   Not on file   Social Determinants of Health   Financial Resource Strain: Not on file  Food Insecurity: Not on file  Transportation Needs: Not on file  Physical Activity: Not on file  Stress: Not on file  Social Connections: Not on file  Intimate  Partner Violence: Not on file    FAMILY HISTORY: Family History  Problem Relation Age of Onset   Ovarian cancer Mother    Asthma Mother    Alcoholism Father    Throat cancer Maternal Uncle    Heart disease Maternal Uncle    Lung cancer Maternal Uncle    Lymphoma Maternal Uncle    Lung cancer Paternal Grandmother     ALLERGIES:  has No Known Allergies.  MEDICATIONS:  Current Outpatient Medications  Medication Sig Dispense Refill   acetaminophen-codeine (TYLENOL #3) 300-30 MG tablet Take 1 tablet by mouth every 6 (six) hours as needed for moderate pain. 30 tablet 0   acyclovir (ZOVIRAX) 400 MG tablet Take 1 tablet (400 mg total) by mouth 2 (two) times daily. 60 tablet 5   allopurinol (ZYLOPRIM) 100 MG tablet Take 0.5 tablets (50 mg total) by mouth daily. 30 tablet 2   dexamethasone (DECADRON) 4 MG tablet Take 5 tablets (20 mg total) by mouth See admin instructions. take dexamethasone 20 mg, day 2, 9,16. 60 tablet 0   ondansetron (ZOFRAN) 8 MG tablet Take 1 tablet (8 mg total) by mouth 2 (two)  times daily as needed for nausea or vomiting. 20 tablet 2   potassium chloride SA (KLOR-CON M) 20 MEQ tablet Take 1 tablet (20 mEq total) by mouth daily. 30 tablet 0   prochlorperazine (COMPAZINE) 10 MG tablet Take 1 tablet (10 mg total) by mouth every 6 (six) hours as needed for nausea or vomiting. 30 tablet 2   REVLIMID 25 MG capsule TAKE 1 CAPSULE BY MOUTH ONCE DAILY FOR 14 DAYS ON AND 7 DAYS OFF 14 capsule 0   No current facility-administered medications for this visit.    Review of Systems  Constitutional:  Positive for fatigue. Negative for appetite change, chills, fever and unexpected weight change.  HENT:   Negative for hearing loss and voice change.   Eyes:  Negative for eye problems and icterus.  Respiratory:  Negative for chest tightness, cough and shortness of breath.   Cardiovascular:  Negative for chest pain and leg swelling.  Gastrointestinal:  Negative for abdominal  distention and abdominal pain.  Endocrine: Negative for hot flashes.  Genitourinary:  Negative for difficulty urinating, dysuria and frequency.   Musculoskeletal:  Positive for back pain. Negative for arthralgias.  Skin:  Negative for itching and rash.  Neurological:  Negative for light-headedness and numbness.  Hematological:  Negative for adenopathy. Does not bruise/bleed easily.  Psychiatric/Behavioral:  Negative for confusion.      PHYSICAL EXAMINATION: ECOG PERFORMANCE STATUS: 1 - Symptomatic but completely ambulatory  Vitals:   09/23/21 0909  BP: 127/76  Pulse: (!) 102  Resp: 18  Temp: 97.6 F (36.4 C)   Filed Weights   09/23/21 0909  Weight: 220 lb 9.6 oz (100.1 kg)    Physical Exam Constitutional:      General: He is not in acute distress.    Appearance: He is obese. He is not diaphoretic.  HENT:     Head: Normocephalic and atraumatic.     Nose: Nose normal.     Mouth/Throat:     Pharynx: No oropharyngeal exudate.  Eyes:     General: No scleral icterus.    Pupils: Pupils are equal, round, and reactive to light.  Cardiovascular:     Rate and Rhythm: Normal rate and regular rhythm.     Heart sounds: No murmur heard. Pulmonary:     Effort: Pulmonary effort is normal. No respiratory distress.     Breath sounds: No rales.  Chest:     Chest wall: No tenderness.  Abdominal:     General: There is no distension.     Palpations: Abdomen is soft.     Tenderness: There is no abdominal tenderness.  Musculoskeletal:        General: Normal range of motion.     Cervical back: Normal range of motion and neck supple.  Skin:    General: Skin is warm and dry.     Findings: No erythema.  Neurological:     Mental Status: He is alert and oriented to person, place, and time.     Cranial Nerves: No cranial nerve deficit.     Motor: No abnormal muscle tone.     Coordination: Coordination normal.  Psychiatric:        Mood and Affect: Affect normal.      LABORATORY  DATA:  I have reviewed the data as listed    Latest Ref Rng & Units 09/23/2021    8:58 AM 09/19/2021    1:02 PM 09/16/2021    8:56 AM  CBC  WBC 4.0 - 10.5  K/uL 2.1  3.4  3.4   Hemoglobin 13.0 - 17.0 g/dL 8.4  8.7  8.0   Hematocrit 39.0 - 52.0 % 24.7  25.8  23.1   Platelets 150 - 400 K/uL 196  171  121       Latest Ref Rng & Units 09/23/2021    8:58 AM 09/16/2021    8:56 AM 09/09/2021    8:44 AM  CMP  Glucose 70 - 99 mg/dL 120  111  95   BUN 6 - 20 mg/dL 16  26  31    Creatinine 0.61 - 1.24 mg/dL 1.03  1.36  1.80   Sodium 135 - 145 mmol/L 130  126  122   Potassium 3.5 - 5.1 mmol/L 3.2  3.6  4.1   Chloride 98 - 111 mmol/L 104  104  97   CO2 22 - 32 mmol/L 24  19  19    Calcium 8.9 - 10.3 mg/dL 8.2  8.2  9.4   Total Protein 6.5 - 8.1 g/dL 10.8  >12.0  >12.0   Total Bilirubin 0.3 - 1.2 mg/dL 0.3  0.6  0.9   Alkaline Phos 38 - 126 U/L 95  79  94   AST 15 - 41 U/L 22  22  24    ALT 0 - 44 U/L 26  45  79        RADIOGRAPHIC STUDIES: I have personally reviewed the radiological images as listed and agreed with the findings in the report. NM PET Image Initial (PI) Whole Body  Result Date: 09/11/2021 CLINICAL DATA:  Initial treatment strategy for multiple myeloma. EXAM: NUCLEAR MEDICINE PET WHOLE BODY TECHNIQUE: 12.5 mCi F-18 FDG was injected intravenously. Full-ring PET imaging was performed from the head to foot after the radiotracer. CT data was obtained and used for attenuation correction and anatomic localization. Fasting blood glucose: 99 mg/dl COMPARISON:  CT August 14, 2021 FINDINGS: Mediastinal blood pool activity: SUV max 2.11 HEAD/NECK: Hypermetabolic activity in the clivus, occipital condyles, and left maxilla. For reference lucency throughout the clivus with max SUV of 13.1. There is no hypermetabolic cervical lymph nodes. Incidental CT findings: none CHEST: No hypermetabolic mediastinal or hilar nodes. No suspicious pulmonary nodules on the CT scan. Incidental CT findings: none  ABDOMEN/PELVIS: No abnormal hypermetabolic activity within the liver, pancreas, adrenal glands, or spleen. No hypermetabolic lymph nodes in the abdomen or pelvis. Incidental CT findings: Multifocal soft tissue subcutaneous throughout the anterior abdominal wall do not demonstrate significant abnormal FDG avidity and most likely reflects sequela of subcutaneous injections. SKELETON: Extensive hypermetabolic osseous activity with multifocal lucent osseous lesions throughout the visualized axial and appendicular skeleton. For reference: - max SUV in the T12 vertebral body is 10.7 - max SUV in the right hemisacrum of 11.05. -large lucent lesion in the left iliac bone which demonstrates cortical break on image 98/2 with a max SUV of 11.2. Incidental CT findings: none EXTREMITIES: Hypermetabolic foci in the bilateral femurs for instance in the left femur max SUV of 12.9. Incidental CT findings: none IMPRESSION: 1. Extensive multifocal hypermetabolic disease throughout the axial and appendicular skeleton, with numerous lucent osseous lesions, consistent with patient's known diagnosis of multiple myeloma. 2. No tracer avid lymph node or mass identified to suggest soft tissue plasmacytoma Electronically Signed   By: Dahlia Bailiff M.D.   On: 09/11/2021 11:51

## 2021-09-23 NOTE — Patient Instructions (Signed)
[facility] CANCER CTR AT [place]  Discharge Instructions: Thank you for choosing [facility] to provide your oncology and hematology care.  If you have a lab appointment with the [facility], please go directly to the [place] and check in at the registration area.  Wear comfortable clothing and clothing appropriate for easy access to any Portacath or PICC line.   We strive to give you quality time with your provider. You may need to reschedule your appointment if you arrive late (15 or more minutes).  Arriving late affects you and other patients whose appointments are after yours.  Also, if you miss three or more appointments without notifying the office, you may be dismissed from the clinic at the provider's discretion.      For prescription refill requests, have your pharmacy contact our office and allow 72 hours for refills to be completed.    Today you received the following chemotherapy and/or immunotherapy agents Darzalex, Velcade      To help prevent nausea and vomiting after your treatment, we encourage you to take your nausea medication as directed.  BELOW ARE SYMPTOMS THAT SHOULD BE REPORTED IMMEDIATELY: *FEVER GREATER THAN 100.4 F (38 C) OR HIGHER *CHILLS OR SWEATING *NAUSEA AND VOMITING THAT IS NOT CONTROLLED WITH YOUR NAUSEA MEDICATION *UNUSUAL SHORTNESS OF BREATH *UNUSUAL BRUISING OR BLEEDING *URINARY PROBLEMS (pain or burning when urinating, or frequent urination) *BOWEL PROBLEMS (unusual diarrhea, constipation, pain near the anus) TENDERNESS IN MOUTH AND THROAT WITH OR WITHOUT PRESENCE OF ULCERS (sore throat, sores in mouth, or a toothache) UNUSUAL RASH, SWELLING OR PAIN  UNUSUAL VAGINAL DISCHARGE OR ITCHING   Items with * indicate a potential emergency and should be followed up as soon as possible or go to the Emergency Department if any problems should occur.  Please show the CHEMOTHERAPY ALERT CARD or IMMUNOTHERAPY ALERT CARD at  check-in to the Emergency Department and triage nurse.  Should you have questions after your visit or need to cancel or reschedule your appointment, please contact [facility] CANCER [place] AT [place]  [phone] and follow the prompts.  Office hours are 8:00 a.m. to 4:30 p.m. Monday - Friday. Please note that voicemails left after 4:00 p.m. may not be returned until the following business day.  We are closed weekends and major holidays. You have access to a nurse at all times for urgent questions. Please call the main number to the clinic [phone] and follow the prompts.  For any non-urgent questions, you may also contact your provider using MyChart. We now offer e-Visits for anyone 83 and older to request care online for non-urgent symptoms. For details visit [email or web address].   Also download the MyChart app! Go to the app store, search "MyChart", open the app, select [facility], and log in with your MyChart username and password.  Masks are optional in the cancer centers. If you would like for your care team to wear a mask while they are taking care of you, please let them know. For doctor visits, patients may have with them one support person who is at least 35 years old. At this time, visitors are not allowed in the infusion area.

## 2021-09-24 ENCOUNTER — Inpatient Hospital Stay: Payer: Commercial Managed Care - PPO

## 2021-09-24 ENCOUNTER — Ambulatory Visit
Admission: RE | Admit: 2021-09-24 | Discharge: 2021-09-24 | Disposition: A | Discharge status: Home / Self Care | Payer: Commercial Managed Care - PPO | Source: Ambulatory Visit | Attending: Oncology | Admitting: Oncology

## 2021-09-24 DIAGNOSIS — C9000 Multiple myeloma not having achieved remission: Secondary | ICD-10-CM

## 2021-09-24 DIAGNOSIS — R000 Tachycardia, unspecified: Secondary | ICD-10-CM | POA: Diagnosis present

## 2021-09-24 DIAGNOSIS — D649 Anemia, unspecified: Secondary | ICD-10-CM

## 2021-09-24 DIAGNOSIS — Z87891 Personal history of nicotine dependence: Secondary | ICD-10-CM | POA: Diagnosis not present

## 2021-09-24 DIAGNOSIS — Z5112 Encounter for antineoplastic immunotherapy: Secondary | ICD-10-CM | POA: Diagnosis not present

## 2021-09-24 LAB — ECHOCARDIOGRAM COMPLETE
AR max vel: 2.74 cm2
AV Area VTI: 3.1 cm2
AV Area mean vel: 2.74 cm2
AV Mean grad: 4 mmHg
AV Peak grad: 7 mmHg
Ao pk vel: 1.32 m/s
Area-P 1/2: 4.06 cm2
S' Lateral: 3.2 cm

## 2021-09-24 MED ORDER — ACETAMINOPHEN 325 MG PO TABS
650.0000 mg | ORAL_TABLET | Freq: Once | ORAL | Status: AC
  Administered 2021-09-24: 650 mg via ORAL
  Filled 2021-09-24: qty 2

## 2021-09-24 MED ORDER — DIPHENHYDRAMINE HCL 25 MG PO CAPS
25.0000 mg | ORAL_CAPSULE | Freq: Once | ORAL | Status: AC
  Administered 2021-09-24: 25 mg via ORAL
  Filled 2021-09-24: qty 1

## 2021-09-24 MED ORDER — LORAZEPAM 2 MG/ML IJ SOLN
0.5000 mg | Freq: Once | INTRAMUSCULAR | Status: AC | PRN
  Administered 2021-09-24: 0.5 mg via INTRAVENOUS
  Filled 2021-09-24: qty 1

## 2021-09-24 MED ORDER — SODIUM CHLORIDE 0.9% IV SOLUTION
250.0000 mL | Freq: Once | INTRAVENOUS | Status: AC
  Administered 2021-09-24: 250 mL via INTRAVENOUS
  Filled 2021-09-24: qty 250

## 2021-09-24 NOTE — Progress Notes (Signed)
*  PRELIMINARY RESULTS* Echocardiogram 2D Echocardiogram has been performed.  Tanner [name] 09/24/2021, 9:53 AM

## 2021-09-25 ENCOUNTER — Telehealth: Payer: Self-pay

## 2021-09-25 ENCOUNTER — Other Ambulatory Visit: Payer: Self-pay | Admitting: Oncology

## 2021-09-25 DIAGNOSIS — C7951 Secondary malignant neoplasm of bone: Secondary | ICD-10-CM

## 2021-09-25 DIAGNOSIS — C9000 Multiple myeloma not having achieved remission: Secondary | ICD-10-CM

## 2021-09-25 LAB — TYPE AND SCREEN
ABO/RH(D): O POS
Antibody Screen: NEGATIVE
Unit division: 0

## 2021-09-25 LAB — BPAM RBC
Blood Product Expiration Date: 202309262359
ISSUE DATE / TIME: 202309201037
Unit Type and Rh: 9500

## 2021-09-25 NOTE — Telephone Encounter (Signed)
-----   Message from [name], MD sent at 09/24/2021  5:25 PM EDT ----- Let him know that I have discussed with neurosurgeon who does not feel he needs surgery. Recommend radiation. Refer to radon onc.

## 2021-09-25 NOTE — Telephone Encounter (Signed)
Pt informed of MD recommendation and verbalized understanding.   Referral to rad onc has been entered. Please schedule appt and notify pt of appt. Thanks.

## 2021-09-26 ENCOUNTER — Other Ambulatory Visit: Payer: Self-pay

## 2021-09-26 ENCOUNTER — Inpatient Hospital Stay: Payer: Commercial Managed Care - PPO

## 2021-09-26 VITALS — BP 123/84 | HR 98 | Temp 97.0°F | Resp 18

## 2021-09-26 DIAGNOSIS — C9000 Multiple myeloma not having achieved remission: Secondary | ICD-10-CM

## 2021-09-26 DIAGNOSIS — Z5112 Encounter for antineoplastic immunotherapy: Secondary | ICD-10-CM | POA: Diagnosis not present

## 2021-09-26 MED ORDER — BORTEZOMIB CHEMO SQ INJECTION 3.5 MG (2.5MG/ML)
1.3000 mg/m2 | Freq: Once | INTRAMUSCULAR | Status: AC
  Administered 2021-09-26: 3 mg via SUBCUTANEOUS
  Filled 2021-09-26: qty 1.2

## 2021-09-26 MED ORDER — PROCHLORPERAZINE MALEATE 10 MG PO TABS
10.0000 mg | ORAL_TABLET | Freq: Once | ORAL | Status: AC
  Administered 2021-09-26: 10 mg via ORAL
  Filled 2021-09-26: qty 1

## 2021-09-26 NOTE — Patient Instructions (Signed)
[facility]CER CTR AT [place]  Discharge Instructions: Thank you for choosing [facility] to provide your oncology and hematology care.  If you have a lab appointment with the Cancer Center, please go directly to the Cancer Center and check in at the registration area.  Wear comfortable clothing and clothing appropriate for easy access to any Portacath or PICC line.   We strive to give you quality time with your provider. You may need to reschedule your appointment if you arrive late (15 or more minutes).  Arriving late affects you and other patients whose appointments are after yours.  Also, if you miss three or more appointments without notifying the office, you may be dismissed from the clinic at the provider's discretion.      For prescription refill requests, have your pharmacy contact our office and allow 72 hours for refills to be completed.    Today you received the following chemotherapy and/or immunotherapy agents Velcade      To help prevent nausea and vomiting after your treatment, we encourage you to take your nausea medication as directed.  BELOW ARE SYMPTOMS THAT SHOULD BE REPORTED IMMEDIATELY: *FEVER GREATER THAN 100.4 F (38 C) OR HIGHER *CHILLS OR SWEATING *NAUSEA AND VOMITING THAT IS NOT CONTROLLED WITH YOUR NAUSEA MEDICATION *UNUSUAL SHORTNESS OF BREATH *UNUSUAL BRUISING OR BLEEDING *URINARY PROBLEMS (pain or burning when urinating, or frequent urination) *BOWEL PROBLEMS (unusual diarrhea, constipation, pain near the anus) TENDERNESS IN MOUTH AND THROAT WITH OR WITHOUT PRESENCE OF ULCERS (sore throat, sores in mouth, or a toothache) UNUSUAL RASH, SWELLING OR PAIN  UNUSUAL VAGINAL DISCHARGE OR ITCHING   Items with * indicate a potential emergency and should be followed up as soon as possible or go to the Emergency Department if any problems should occur.  Please show the CHEMOTHERAPY ALERT CARD or IMMUNOTHERAPY ALERT CARD at check-in to the  Emergency Department and triage nurse.  Should you have questions after your visit or need to cancel or reschedule your appointment, please contact [facility] AT [place]  [phone] and follow the prompts.  Office hours are 8:00 a.m. to 4:30 p.m. Monday - Friday. Please note that voicemails left after 4:00 p.m. may not be returned until the following business day.  We are closed weekends and major holidays. You have access to a nurse at all times for urgent questions. Please call the main number to the clinic [phone] and follow the prompts.  For any non-urgent questions, you may also contact your provider using MyChart. We now offer e-Visits for anyone 18 and older to request care online for non-urgent symptoms. For details visit [email or web address].   Also download the MyChart app! Go to the app store, search "MyChart", open the app, select [facility], and log in with your MyChart username and password.  Masks are optional in the cancer centers. If you would like for your care team to wear a mask while they are taking care of you, please let them know. For doctor visits, patients may have with them one support person who is at least 35 years old. At this time, visitors are not allowed in the infusion area.   

## 2021-09-29 ENCOUNTER — Inpatient Hospital Stay (HOSPITAL_BASED_OUTPATIENT_CLINIC_OR_DEPARTMENT_OTHER): Payer: Commercial Managed Care - PPO | Admitting: Oncology

## 2021-09-29 ENCOUNTER — Inpatient Hospital Stay: Payer: Commercial Managed Care - PPO

## 2021-09-29 ENCOUNTER — Encounter: Payer: Self-pay | Admitting: Oncology

## 2021-09-29 ENCOUNTER — Inpatient Hospital Stay: Payer: Commercial Managed Care - PPO | Admitting: Oncology

## 2021-09-29 VITALS — BP 123/89 | HR 96 | Temp 97.7°F | Resp 18 | Wt 226.5 lb

## 2021-09-29 DIAGNOSIS — C9000 Multiple myeloma not having achieved remission: Secondary | ICD-10-CM

## 2021-09-29 DIAGNOSIS — M545 Low back pain, unspecified: Secondary | ICD-10-CM | POA: Diagnosis not present

## 2021-09-29 DIAGNOSIS — N189 Chronic kidney disease, unspecified: Secondary | ICD-10-CM

## 2021-09-29 DIAGNOSIS — R000 Tachycardia, unspecified: Secondary | ICD-10-CM

## 2021-09-29 DIAGNOSIS — D649 Anemia, unspecified: Secondary | ICD-10-CM

## 2021-09-29 DIAGNOSIS — Z5112 Encounter for antineoplastic immunotherapy: Secondary | ICD-10-CM | POA: Diagnosis not present

## 2021-09-29 DIAGNOSIS — Z5111 Encounter for antineoplastic chemotherapy: Secondary | ICD-10-CM | POA: Diagnosis not present

## 2021-09-29 LAB — COMPREHENSIVE METABOLIC PANEL
ALT: 64 U/L — ABNORMAL HIGH (ref 0–44)
AST: 27 U/L (ref 15–41)
Albumin: 2.6 g/dL — ABNORMAL LOW (ref 3.5–5.0)
Alkaline Phosphatase: 200 U/L — ABNORMAL HIGH (ref 38–126)
Anion gap: 2 — ABNORMAL LOW (ref 5–15)
BUN: 10 mg/dL (ref 6–20)
CO2: 29 mmol/L (ref 22–32)
Calcium: 7.8 mg/dL — ABNORMAL LOW (ref 8.9–10.3)
Chloride: 100 mmol/L (ref 98–111)
Creatinine, Ser: 0.78 mg/dL (ref 0.61–1.24)
GFR, Estimated: >60 mL/min (ref >60)
Glucose, Bld: 108 mg/dL — ABNORMAL HIGH (ref 70–99)
Potassium: 3.2 mmol/L — ABNORMAL LOW (ref 3.5–5.1)
Sodium: 131 mmol/L — ABNORMAL LOW (ref 135–145)
Total Bilirubin: 0.7 mg/dL (ref 0.3–1.2)
Total Protein: 8.3 g/dL — ABNORMAL HIGH (ref 6.5–8.1)

## 2021-09-29 LAB — CBC WITH DIFFERENTIAL/PLATELET
Abs Immature Granulocytes: 0.03 10*3/uL (ref 0.00–0.07)
Basophils Absolute: 0 10*3/uL (ref 0.0–0.1)
Basophils Relative: 0 %
Eosinophils Absolute: 0.1 10*3/uL (ref 0.0–0.5)
Eosinophils Relative: 2 %
HCT: 26.9 % — ABNORMAL LOW (ref 39.0–52.0)
Hemoglobin: 9.2 g/dL — ABNORMAL LOW (ref 13.0–17.0)
Immature Granulocytes: 1 %
Lymphocytes Relative: 23 %
Lymphs Abs: 0.6 10*3/uL — ABNORMAL LOW (ref 0.7–4.0)
MCH: 28.1 pg (ref 26.0–34.0)
MCHC: 34.2 g/dL (ref 30.0–36.0)
MCV: 82.3 fL (ref 80.0–100.0)
Monocytes Absolute: 0.3 10*3/uL (ref 0.1–1.0)
Monocytes Relative: 10 %
Neutro Abs: 1.5 10*3/uL — ABNORMAL LOW (ref 1.7–7.7)
Neutrophils Relative %: 64 %
Platelets: 211 10*3/uL (ref 150–400)
RBC: 3.27 MIL/uL — ABNORMAL LOW (ref 4.22–5.81)
RDW: 16.2 % — ABNORMAL HIGH (ref 11.5–15.5)
WBC: 2.5 10*3/uL — ABNORMAL LOW (ref 4.0–10.5)
nRBC: 1.6 % — ABNORMAL HIGH (ref 0.0–0.2)

## 2021-09-29 LAB — TYPE AND SCREEN
ABO/RH(D): O POS
Antibody Screen: NEGATIVE

## 2021-09-29 NOTE — Progress Notes (Signed)
Pt here for follow up. No new concerns voiced.   

## 2021-09-29 NOTE — Telephone Encounter (Signed)
Pt informed of rad onc appt.

## 2021-09-29 NOTE — Assessment & Plan Note (Signed)
Chemotherapy plan as listed above 

## 2021-09-29 NOTE — Assessment & Plan Note (Signed)
#  IgG lamda multiple myeloma On chemotherapy plan with lenalidomide (25 mg daily on days 1-14), subcutaneous bortezomib (1.3 mg/m2 on days 1, 4, 8, and 11), and oral dexamethasone (20 mg on days 1, 2, 8, 9, 15, and 16). SubQ daratumumab on days 1, 8, and 15 of cycles 1 through 4 and day 1 of post-ASCT consolidation cycles (cycles 5 and 6).   Labs are reviewed and discussed with patient. Proceed with Daratumumab- Continue  Aspirin 74m daily  #Hypoalbuminemia, follow up with nutritionist #Hyperuricemia, continue allopurinol

## 2021-09-29 NOTE — Assessment & Plan Note (Signed)
Avoid nephrotoxins, encourage oral hydration 

## 2021-09-29 NOTE — Assessment & Plan Note (Signed)
Recommend Tylenol 3 every 6 hours as needed.

## 2021-09-29 NOTE — Assessment & Plan Note (Signed)
Likely due to anxiety.  Echo results were reviewed and discussed with patient.

## 2021-09-29 NOTE — Progress Notes (Signed)
Hematology/Oncology Progress note [phone] [phone]        REFERRING PROVIDER: [name], MD    ASSESSMENT & PLAN:   Cancer Staging  Multiple myeloma not having achieved remission [facility]) Staging form: Plasma Cell Myeloma and Plasma Cell Disorders, AJCC 8th Edition - Clinical stage from 08/20/2021: Beta-2-microglobulin (mg/L): 3.6, Albumin (g/dL): 2.2, ISS: Stage II, High-risk cytogenetics: Unknown, LDH: Normal - Signed by [name], MD on 08/25/2021   Multiple myeloma not having achieved remission ([place]) # IgG lamda multiple myeloma On chemotherapy plan with lenalidomide (25 mg daily on days 1-14), subcutaneous bortezomib (1.3 mg/m2 on days 1, 4, 8, and 11), and oral dexamethasone (20 mg on days 1, 2, 8, 9, 15, and 16). SubQ daratumumab on days 1, 8, and 15 of cycles 1 through 4 and day 1 of post-ASCT consolidation cycles (cycles 5 and 6).   Labs are reviewed and discussed with patient. Proceed with Daratumumab- Continue  Aspirin 59m daily  #Hypoalbuminemia, follow up with nutritionist #Hyperuricemia, continue allopurinol  Encounter for antineoplastic chemotherapy Chemotherapy plan as listed above.   Back pain Recommend Tylenol 3 every 6 hours as needed.     CKD (chronic kidney disease) Avoid nephrotoxins, encourage oral hydration  Tachycardia Likely due to anxiety.  Echo results were reviewed and discussed with patient.     Recommend patient to obtain dental clearance Patient gets dexamethasone 234mon Day 1, 8, 15. Recommend him to take dexamethasone 20 mg, day 2, 9,16.   No orders of the defined types were placed in this encounter.  Follow up  1 week lab MD Dara Velcade- All questions were answered. The patient knows to call the clinic with any problems, questions or concerns. No barriers to learning was detected.  ZhEarlie ServerMD 09/29/2021   CHIEF COMPLAINTS/PURPOSE OF CONSULTATION:  Multiple myeloma  HISTORY OF PRESENTING ILLNESS:   Tanner Whitefield36.o. male presents  for follow up of Multiple myeloma I have reviewed his chart and materials related to his cancer extensively and collaborated history with the patient. Summary of oncologic history is as follows: Oncology History  [facility] [place] Imaging   CT chest abdomen pelvis without contrast Showed numerous lucencies throughout the bone structures, most pronounced throughout the spine and left left iliac bone.  Otherwise no acute findings in the chest abdomen or pelvis.  Sigmoid diverticulosis.   08/19/2021 Initial Diagnosis   Multiple myeloma not having achieved remission, stage II  -08/15/2021, bone marrow biopsy showed plasma cell myeloma involving greater than 80% of the marrow. Cytogenetics Myeloma  FISH   08/20/2021 Cancer Staging   Staging form: Plasma Cell Myeloma and Plasma Cell Disorders, AJCC 8th Edition - Clinical stage from 08/20/2021: Beta-2-microglobulin (mg/L): 3.6, Albumin (g/dL): 2.2, ISS: Stage II, High-risk cytogenetics: Unknown, LDH: Normal - Signed by YuEarlie ServerMD on 08/25/2021 Beta 2 microglobulin range (mg/L): 3.5 to 5.49 Albumin range (g/dL): Less than 3.5 Cytogenetics: Unknown   08/20/2021 -  Chemotherapy   MYELOMA NEWLY DIAGNOSED TRANSPLANT CANDIDATE DaraVRd (Daratumumab SUBQ) q21d x 6 Cycles (Induction/Consolidation)      09/11/2021 Imaging   PET scan showed  1. Extensive multifocal hypermetabolic disease throughout the axial and appendicular skeleton, with numerous lucent osseous lesions,consistent with patient's known diagnosis of multiple myeloma. 2. No tracer avid lymph node or mass identified to suggest soft tissue plasmacytoma     INTERVAL HISTORY ChThailan [name] a 36.o. male who has above history reviewed  by me today presents for follow up visit for management of multiple myeloma Denies any neuropathy symptoms. Tolerates Revlimid, no diarrhea + acute on  chronic lower back pain, stable symptoms.  He feels the Tylenol 3 is controlling his symptoms. + fatigue, stable.  + Anxiety, manageable.  He declines anxiety medications. No nausea vomiting diarrhea.  Denies numbness and tingling  MEDICAL HISTORY:  Past Medical History:  Diagnosis Date   Alcohol use    Sciatica    Smoking     SURGICAL HISTORY: Past Surgical History:  Procedure Laterality Date   INGUINAL HERNIA REPAIR      SOCIAL HISTORY: Social History   Socioeconomic History   Marital status: Single    Spouse name: Not on file   Number of children: Not on file   Years of education: Not on file   Highest education level: Not on file  Occupational History   Not on file  Tobacco Use   Smoking status: Former    Packs/day: 0.50    Years: 10.00    Total pack years: 5.00    Types: E-cigarettes, Cigarettes    Quit date: 08/11/2021    Years since quitting: 0.1   Smokeless tobacco: Never  Vaping Use   Vaping Use: Every day  Substance and Sexual Activity   Alcohol use: Not Currently    Alcohol/week: 24.0 standard drinks of alcohol    Types: 24 Cans of beer per week    Comment: quit drinking approx 08/11/2021   Drug use: Never   Sexual activity: Not on file  Other Topics Concern   Not on file  Social History Narrative   Not on file   Social Determinants of Health   Financial Resource Strain: Not on file  Food Insecurity: Not on file  Transportation Needs: Not on file  Physical Activity: Not on file  Stress: Not on file  Social Connections: Not on file  Intimate Partner Violence: Not on file    FAMILY HISTORY: Family History  Problem Relation Age of Onset   Ovarian cancer Mother    Asthma Mother    Alcoholism Father    Throat cancer Maternal Uncle    Heart disease Maternal Uncle    Lung cancer Maternal Uncle    Lymphoma Maternal Uncle    Lung cancer Paternal Grandmother     ALLERGIES:  has No Known Allergies.  MEDICATIONS:  Current Outpatient  Medications  Medication Sig Dispense Refill   acetaminophen-codeine (TYLENOL #3) 300-30 MG tablet Take 1 tablet by mouth every 6 (six) hours as needed for moderate pain. 30 tablet 0   acyclovir (ZOVIRAX) 400 MG tablet Take 1 tablet (400 mg total) by mouth 2 (two) times daily. 60 tablet 5   allopurinol (ZYLOPRIM) 100 MG tablet Take 0.5 tablets (50 mg total) by mouth daily. 30 tablet 2   dexamethasone (DECADRON) 4 MG tablet Take 5 tablets (20 mg total) by mouth See admin instructions. take dexamethasone 20 mg, day 2, 9,16. 60 tablet 0   ondansetron (ZOFRAN) 8 MG tablet Take 1 tablet (8 mg total) by mouth 2 (two) times daily as needed for nausea or vomiting. 20 tablet 2   potassium chloride SA (KLOR-CON M) 20 MEQ tablet Take 1 tablet (20 mEq total) by mouth daily. 30 tablet 0   prochlorperazine (COMPAZINE) 10 MG tablet Take 1 tablet (10 mg total) by mouth every 6 (six) hours as needed for nausea or vomiting. 30 tablet 2   REVLIMID 25 MG capsule TAKE 1  CAPSULE BY MOUTH ONCE DAILY FOR 14 DAYS ON AND 7 DAYS OFF 14 capsule 0   No current facility-administered medications for this visit.    Review of Systems  Constitutional:  Positive for fatigue. Negative for appetite change, chills, fever and unexpected weight change.  HENT:   Negative for hearing loss and voice change.   Eyes:  Negative for eye problems and icterus.  Respiratory:  Negative for chest tightness, cough and shortness of breath.   Cardiovascular:  Negative for chest pain and leg swelling.  Gastrointestinal:  Negative for abdominal distention and abdominal pain.  Endocrine: Negative for hot flashes.  Genitourinary:  Negative for difficulty urinating, dysuria and frequency.   Musculoskeletal:  Positive for back pain. Negative for arthralgias.  Skin:  Negative for itching and rash.  Neurological:  Negative for light-headedness and numbness.  Hematological:  Negative for adenopathy. Does not bruise/bleed easily.  Psychiatric/Behavioral:   Negative for confusion.      PHYSICAL EXAMINATION: ECOG PERFORMANCE STATUS: 1 - Symptomatic but completely ambulatory  Vitals:   09/29/21 1351  BP: 123/89  Pulse: 96  Resp: 18  Temp: 97.7 F (36.5 C)   Filed Weights   09/29/21 1351  Weight: 226 lb 8 oz (102.7 kg)    Physical Exam Constitutional:      General: He is not in acute distress.    Appearance: He is obese. He is not diaphoretic.  HENT:     Head: Normocephalic and atraumatic.     Nose: Nose normal.     Mouth/Throat:     Pharynx: No oropharyngeal exudate.  Eyes:     General: No scleral icterus.    Pupils: Pupils are equal, round, and reactive to light.  Cardiovascular:     Rate and Rhythm: Normal rate.     Heart sounds: No murmur heard. Pulmonary:     Effort: Pulmonary effort is normal. No respiratory distress.     Breath sounds: No rales.  Chest:     Chest wall: No tenderness.  Abdominal:     General: There is no distension.     Palpations: Abdomen is soft.     Tenderness: There is no abdominal tenderness.  Musculoskeletal:        General: Normal range of motion.     Cervical back: Normal range of motion and neck supple.  Skin:    General: Skin is warm and dry.     Findings: No erythema.  Neurological:     Mental Status: He is alert and oriented to person, place, and time.     Cranial Nerves: No cranial nerve deficit.     Motor: No abnormal muscle tone.     Coordination: Coordination normal.  Psychiatric:        Mood and Affect: Mood and affect normal.      LABORATORY DATA:  I have reviewed the data as listed    Latest Ref Rng & Units 09/29/2021    1:36 PM 09/23/2021    8:58 AM 09/19/2021    1:02 PM  CBC  WBC 4.0 - 10.5 K/uL 2.5  2.1  3.4   Hemoglobin 13.0 - 17.0 g/dL 9.2  8.4  8.7   Hematocrit 39.0 - 52.0 % 26.9  24.7  25.8   Platelets 150 - 400 K/uL 211  196  171       Latest Ref Rng & Units 09/29/2021    1:36 PM 09/23/2021    8:58 AM 09/16/2021    8:56 AM  CMP  Glucose 70 - 99 mg/dL  108  120  111   BUN 6 - 20 mg/dL 10  16  26    Creatinine 0.61 - 1.24 mg/dL 0.78  1.03  1.36   Sodium 135 - 145 mmol/L 131  130  126   Potassium 3.5 - 5.1 mmol/L 3.2  3.2  3.6   Chloride 98 - 111 mmol/L 100  104  104   CO2 22 - 32 mmol/L 29  24  19    Calcium 8.9 - 10.3 mg/dL 7.8  8.2  8.2   Total Protein 6.5 - 8.1 g/dL 8.3  10.8  >12.0   Total Bilirubin 0.3 - 1.2 mg/dL 0.7  0.3  0.6   Alkaline Phos 38 - 126 U/L 200  95  79   AST 15 - 41 U/L 27  22  22    ALT 0 - 44 U/L 64  26  45        RADIOGRAPHIC STUDIES: I have personally reviewed the radiological images as listed and agreed with the findings in the report. ECHOCARDIOGRAM COMPLETE  Result Date: 09/24/2021    ECHOCARDIOGRAM REPORT   Patient Name:   Tanner [name] [facility] Date of Exam: 09/24/2021 Medical Rec #:  [id number]                 Height:       72.0 in Accession #:    [id number]                Weight:       220.6 lb Date of Birth:  10/26/1985                BSA:          2.221 m Patient Age:    36 years                  BP:           127/76 mmHg Patient Gender: M                         HR:           87 bpm. Exam Location:  [facility] Procedure: 2D Echo, Color Doppler and Cardiac Doppler Indications:     Tachycardia  History:         Patient has no prior history of Echocardiogram examinations.                  Risk Factors:Former Smoker.  Sonographer:     Charmayne Sheer Referring Phys:  [id number] [place] Diagnosing Phys: Serafina Royals MD IMPRESSIONS  1. Left ventricular ejection fraction, by estimation, is 55 to 60%. The left ventricle has normal function. The left ventricle has no regional wall motion abnormalities. Left ventricular diastolic parameters were normal.  2. Right ventricular systolic function is normal. The right ventricular size is normal.  3. The mitral valve is normal in structure. Trivial mitral valve regurgitation.  4. The aortic valve is normal in structure. Aortic valve regurgitation is not visualized. FINDINGS  Left  Ventricle: Left ventricular ejection fraction, by estimation, is 55 to 60%. The left ventricle has normal function. The left ventricle has no regional wall motion abnormalities. The left ventricular internal cavity size was normal in size. There is  no left ventricular hypertrophy. Left ventricular diastolic parameters were normal. Right Ventricle: The right ventricular size is normal. No increase in right ventricular wall thickness. Right ventricular  systolic function is normal. Left Atrium: Left atrial size was normal in size. Right Atrium: Right atrial size was normal in size. Pericardium: There is no evidence of pericardial effusion. Mitral Valve: The mitral valve is normal in structure. Trivial mitral valve regurgitation. Tricuspid Valve: The tricuspid valve is normal in structure. Tricuspid valve regurgitation is mild. Aortic Valve: The aortic valve is normal in structure. Aortic valve regurgitation is not visualized. Aortic valve mean gradient measures 4.0 mmHg. Aortic valve peak gradient measures 7.0 mmHg. Aortic valve area, by VTI measures 3.10 cm. Pulmonic Valve: The pulmonic valve was normal in structure. Pulmonic valve regurgitation is trivial. Aorta: The aortic root and ascending aorta are structurally normal, with no evidence of dilitation. IAS/Shunts: No atrial level shunt detected by color flow Doppler.  LEFT VENTRICLE PLAX 2D LVIDd:         4.90 cm   Diastology LVIDs:         3.20 cm   LV e' medial:    9.03 cm/s LV PW:         0.90 cm   LV E/e' medial:  6.0 LV IVS:        0.80 cm   LV e' lateral:   15.70 cm/s LVOT diam:     2.00 cm   LV E/e' lateral: 3.5 LV SV:         67 LV SV Index:   30 LVOT Area:     3.14 cm  RIGHT VENTRICLE RV Basal diam:  3.60 cm RV Mid diam:    3.00 cm TAPSE (M-mode): 2.5 cm LEFT ATRIUM             Index        RIGHT ATRIUM           Index LA diam:        4.00 cm 1.80 cm/m   RA Area:     13.80 cm LA Vol (A2C):   38.1 ml 17.15 ml/m  RA Volume:   29.20 ml  13.15 ml/m LA  Vol (A4C):   37.1 ml 16.70 ml/m LA Biplane Vol: 37.7 ml 16.97 ml/m  AORTIC VALVE AV Area (Vmax):    2.74 cm AV Area (Vmean):   2.74 cm AV Area (VTI):     3.10 cm AV Vmax:           132.00 cm/s AV Vmean:          88.200 cm/s AV VTI:            0.216 m AV Peak Grad:      7.0 mmHg AV Mean Grad:      4.0 mmHg LVOT Vmax:         115.00 cm/s LVOT Vmean:        77.000 cm/s LVOT VTI:          0.213 m LVOT/AV VTI ratio: 0.99  AORTA Ao Root diam: 3.60 cm MITRAL VALVE               TRICUSPID VALVE MV Area (PHT): 4.06 cm    TR Peak grad:   18.0 mmHg MV Decel Time: 187 msec    TR Vmax:        212.00 cm/s MV E velocity: 54.50 cm/s MV A velocity: 79.10 cm/s  SHUNTS MV E/A ratio:  0.69        Systemic VTI:  0.21 m  Systemic Diam: 2.00 cm Serafina Royals MD Electronically signed by Serafina Royals MD Signature Date/Time: 09/24/2021/12:12:34 PM    Final    NM PET Image Initial (PI) Whole Body  Result Date: 09/11/2021 CLINICAL DATA:  Initial treatment strategy for multiple myeloma. EXAM: NUCLEAR MEDICINE PET WHOLE BODY TECHNIQUE: 12.5 mCi F-18 FDG was injected intravenously. Full-ring PET imaging was performed from the head to foot after the radiotracer. CT data was obtained and used for attenuation correction and anatomic localization. Fasting blood glucose: 99 mg/dl COMPARISON:  CT August 14, 2021 FINDINGS: Mediastinal blood pool activity: SUV max 2.11 HEAD/NECK: Hypermetabolic activity in the clivus, occipital condyles, and left maxilla. For reference lucency throughout the clivus with max SUV of 13.1. There is no hypermetabolic cervical lymph nodes. Incidental CT findings: none CHEST: No hypermetabolic mediastinal or hilar nodes. No suspicious pulmonary nodules on the CT scan. Incidental CT findings: none ABDOMEN/PELVIS: No abnormal hypermetabolic activity within the liver, pancreas, adrenal glands, or spleen. No hypermetabolic lymph nodes in the abdomen or pelvis. Incidental CT findings: Multifocal  soft tissue subcutaneous throughout the anterior abdominal wall do not demonstrate significant abnormal FDG avidity and most likely reflects sequela of subcutaneous injections. SKELETON: Extensive hypermetabolic osseous activity with multifocal lucent osseous lesions throughout the visualized axial and appendicular skeleton. For reference: - max SUV in the T12 vertebral body is 10.7 - max SUV in the right hemisacrum of 11.05. -large lucent lesion in the left iliac bone which demonstrates cortical break on image 98/2 with a max SUV of 11.2. Incidental CT findings: none EXTREMITIES: Hypermetabolic foci in the bilateral femurs for instance in the left femur max SUV of 12.9. Incidental CT findings: none IMPRESSION: 1. Extensive multifocal hypermetabolic disease throughout the axial and appendicular skeleton, with numerous lucent osseous lesions, consistent with patient's known diagnosis of multiple myeloma. 2. No tracer avid lymph node or mass identified to suggest soft tissue plasmacytoma Electronically Signed   By: Dahlia Bailiff M.D.   On: 09/11/2021 11:51

## 2021-09-30 ENCOUNTER — Encounter: Payer: Self-pay | Admitting: Oncology

## 2021-09-30 ENCOUNTER — Inpatient Hospital Stay: Payer: Commercial Managed Care - PPO

## 2021-09-30 VITALS — BP 106/69 | HR 88 | Temp 97.0°F | Resp 18

## 2021-09-30 DIAGNOSIS — Z5112 Encounter for antineoplastic immunotherapy: Secondary | ICD-10-CM | POA: Diagnosis not present

## 2021-09-30 DIAGNOSIS — C9000 Multiple myeloma not having achieved remission: Secondary | ICD-10-CM

## 2021-09-30 MED ORDER — DARATUMUMAB-HYALURONIDASE-FIHJ 1800-30000 MG-UT/15ML ~~LOC~~ SOLN
1800.0000 mg | Freq: Once | SUBCUTANEOUS | Status: AC
  Administered 2021-09-30: 1800 mg via SUBCUTANEOUS
  Filled 2021-09-30: qty 15

## 2021-09-30 MED ORDER — MONTELUKAST SODIUM 10 MG PO TABS
10.0000 mg | ORAL_TABLET | Freq: Once | ORAL | Status: AC
  Administered 2021-09-30: 10 mg via ORAL
  Filled 2021-09-30: qty 1

## 2021-09-30 MED ORDER — DEXAMETHASONE 4 MG PO TABS
20.0000 mg | ORAL_TABLET | Freq: Once | ORAL | Status: AC
  Administered 2021-09-30: 20 mg via ORAL
  Filled 2021-09-30: qty 5

## 2021-09-30 MED ORDER — DIPHENHYDRAMINE HCL 25 MG PO CAPS
50.0000 mg | ORAL_CAPSULE | Freq: Once | ORAL | Status: AC
  Administered 2021-09-30: 50 mg via ORAL
  Filled 2021-09-30: qty 2

## 2021-09-30 MED ORDER — ACETAMINOPHEN 325 MG PO TABS
650.0000 mg | ORAL_TABLET | Freq: Once | ORAL | Status: AC
  Administered 2021-09-30: 650 mg via ORAL
  Filled 2021-09-30: qty 2

## 2021-09-30 NOTE — Progress Notes (Signed)
Per Benjamine Mola RN per Dr. Tasia Catchings, pt to be monitored 20 minutes post Darzalex injection.

## 2021-09-30 NOTE — Patient Instructions (Signed)
[facility]  Discharge Instructions: Thank you for choosing [facility] to provide your oncology and hematology care.  If you have a lab appointment with the Cancer Center, please go directly to the Cancer Center and check in at the registration area.  Wear comfortable clothing and clothing appropriate for easy access to any Portacath or PICC line.   We strive to give you quality time with your provider. You may need to reschedule your appointment if you arrive late (15 or more minutes).  Arriving late affects you and other patients whose appointments are after yours.  Also, if you miss three or more appointments without notifying the office, you may be dismissed from the clinic at the provider's discretion.      For prescription refill requests, have your pharmacy contact our office and allow 72 hours for refills to be completed.    Today you received the following chemotherapy and/or immunotherapy agents: Darzalex Faspro      To help prevent nausea and vomiting after your treatment, we encourage you to take your nausea medication as directed.  BELOW ARE SYMPTOMS THAT SHOULD BE REPORTED IMMEDIATELY: *FEVER GREATER THAN 100.4 F (38 C) OR HIGHER *CHILLS OR SWEATING *NAUSEA AND VOMITING THAT IS NOT CONTROLLED WITH YOUR NAUSEA MEDICATION *UNUSUAL SHORTNESS OF BREATH *UNUSUAL BRUISING OR BLEEDING *URINARY PROBLEMS (pain or burning when urinating, or frequent urination) *BOWEL PROBLEMS (unusual diarrhea, constipation, pain near the anus) TENDERNESS IN MOUTH AND THROAT WITH OR WITHOUT PRESENCE OF ULCERS (sore throat, sores in mouth, or a toothache) UNUSUAL RASH, SWELLING OR PAIN  UNUSUAL VAGINAL DISCHARGE OR ITCHING   Items with * indicate a potential emergency and should be followed up as soon as possible or go to the Emergency Department if any problems should occur.  Please show the CHEMOTHERAPY ALERT CARD or IMMUNOTHERAPY ALERT CARD at  check-in to the Emergency Department and triage nurse.  Should you have questions after your visit or need to cancel or reschedule your appointment, please contact [facility]  [phone] and follow the prompts.  Office hours are 8:00 a.m. to 4:30 p.m. Monday - Friday. Please note that voicemails left after 4:00 p.m. may not be returned until the following business day.  We are closed weekends and major holidays. You have access to a nurse at all times for urgent questions. Please call the main number to the clinic [phone] and follow the prompts.  For any non-urgent questions, you may also contact your provider using MyChart. We now offer e-Visits for anyone 18 and older to request care online for non-urgent symptoms. For details visit [email or web address].   Also download the MyChart app! Go to the app store, search "MyChart", open the app, select [facility], and log in with your MyChart username and password.  Masks are optional in the cancer centers. If you would like for your care team to wear a mask while they are taking care of you, please let them know. For doctor visits, patients may have with them one support person who is at least 35 years old. At this time, visitors are not allowed in the infusion area.   

## 2021-09-30 NOTE — Progress Notes (Signed)
Nutrition Follow-up:  Patient with new diagnosis of multiple myeloma.  Patient receiving chemotherapy  Met with patient during infusion.  Reports good appetite.  Denies nausea, diarrhea.  Ate fried chicken, macaroni and cheese and mashed potatoes last night for dinner.  Use to eating 1 large meal a day due to working 3rd shift.  Drinking 1-2 ensure shakes a day.  Drinking water but wants to improve the flavor.  Has cut out sodas.    Medications: reviewed  Labs: reviewed  Anthropometrics:   Weight 226 lb 8 oz increased  223 lb on 8/28 222 lb 14.4 on 8/21 240 lb on 03/26/21   NUTRITION DIAGNOSIS: Inadequate oral intake improving   INTERVENTION:  Discussed Fairlike milk as lactose free option with increased protein and almond/cashew milk blend with protein Encouraged high protein foods  Patient wants to add more fruit Discussed ways to flavor water.  Continue ensure 1-2 a day    MONITORING, EVALUATION, GOAL: weight trends, intake   NEXT VISIT: Tuesday, October 24 during infusion  Lavanda Nevels B. Zenia Resides, [place], [place] Registered Dietitian [phone]

## 2021-09-30 NOTE — Addendum Note (Signed)
Addended by: [name] on: 09/30/2021 10:26 AM   Modules accepted: Orders

## 2021-10-01 ENCOUNTER — Encounter: Payer: Self-pay | Admitting: Oncology

## 2021-10-01 ENCOUNTER — Other Ambulatory Visit: Payer: Self-pay

## 2021-10-01 ENCOUNTER — Ambulatory Visit
Admission: RE | Admit: 2021-10-01 | Discharge: 2021-10-01 | Disposition: A | Discharge status: Home / Self Care | Payer: Commercial Managed Care - PPO | Source: Ambulatory Visit | Attending: Radiation Oncology | Admitting: Radiation Oncology

## 2021-10-01 ENCOUNTER — Institutional Professional Consult (permissible substitution): Payer: Commercial Managed Care - PPO | Admitting: Radiation Oncology

## 2021-10-01 VITALS — BP 122/85 | HR 96 | Temp 96.0°F | Resp 18 | Ht 72.0 in | Wt 225.0 lb

## 2021-10-01 DIAGNOSIS — C9000 Multiple myeloma not having achieved remission: Secondary | ICD-10-CM

## 2021-10-01 LAB — MULTIPLE MYELOMA PANEL, SERUM
Albumin/Glob SerPl: UNDETERMINED
Globulin, Total: UNDETERMINED g/dL
IgA: 12 mg/dL — ABNORMAL LOW (ref 90–386)
IgG (Immunoglobin G), Serum: 10315 mg/dL — ABNORMAL HIGH (ref 603–1613)
IgM (Immunoglobulin M), Srm: 9 mg/dL — ABNORMAL LOW (ref 20–172)

## 2021-10-01 NOTE — Consult Note (Signed)
NEW PATIENT EVALUATION  Name: Tanner [name]  MRN: [id number]  Date:   10/01/2021     DOB: 1985-12-10   This 36 y.o. male patient presents to the clinic for initial evaluation of palliative treatment for multiple myeloma IgG lambda.  REFERRING PHYSICIAN: No ref. provider found  CHIEF COMPLAINT:  Chief Complaint  Patient presents with   consult    DIAGNOSIS: There were no encounter diagnoses.   PREVIOUS INVESTIGATIONS:  PET scan reviewed Pathology reports reviewed Clinical notes reviewed  HPI: Patient is a 36 year old male with known IgG lambda multiple myeloma currently on chemotherapy with lenalidomide bortezomib and oral dexamethasone.  Initial CT scan showed numerous lucent lucencies throughout his bony structures more pronounced in the spine and left iliac bone.  He had a PET CT scan which is a SuperScan showing bone involvement throughout most of his axial skeleton.  He complains of very little pain some left type sciatic pain does have involvement in his left iliac bone.  He is currently on narcotic analgesics.  He is ambulating well and having no focal neurologic deficits.  PLANNED TREATMENT REGIMEN: Observation at this time  PAST MEDICAL HISTORY:  has a past medical history of Alcohol use, Sciatica, and Smoking.    PAST SURGICAL HISTORY:  Past Surgical History:  Procedure Laterality Date   INGUINAL HERNIA REPAIR      FAMILY HISTORY: family history includes Alcoholism in his father; Asthma in his mother; Heart disease in his maternal uncle; Lung cancer in his maternal uncle and paternal grandmother; Lymphoma in his maternal uncle; Ovarian cancer in his mother; Throat cancer in his maternal uncle.  SOCIAL HISTORY:  reports that he quit smoking about 7 weeks ago. His smoking use included e-cigarettes and cigarettes. He has a 5.00 pack-year smoking history. He has never used smokeless tobacco. He reports that he does not currently use alcohol after a past usage  of about 24.0 standard drinks of alcohol per week. He reports that he does not use drugs.  ALLERGIES: Patient has no known allergies.  MEDICATIONS:  Current Outpatient Medications  Medication Sig Dispense Refill   acetaminophen-codeine (TYLENOL #3) 300-30 MG tablet Take 1 tablet by mouth every 6 (six) hours as needed for moderate pain. 30 tablet 0   acyclovir (ZOVIRAX) 400 MG tablet Take 1 tablet (400 mg total) by mouth 2 (two) times daily. 60 tablet 5   allopurinol (ZYLOPRIM) 100 MG tablet Take 0.5 tablets (50 mg total) by mouth daily. 30 tablet 2   dexamethasone (DECADRON) 4 MG tablet Take 5 tablets (20 mg total) by mouth See admin instructions. take dexamethasone 20 mg, day 2, 9,16. 60 tablet 0   ondansetron (ZOFRAN) 8 MG tablet Take 1 tablet (8 mg total) by mouth 2 (two) times daily as needed for nausea or vomiting. 20 tablet 2   potassium chloride SA (KLOR-CON M) 20 MEQ tablet Take 1 tablet (20 mEq total) by mouth daily. 30 tablet 0   prochlorperazine (COMPAZINE) 10 MG tablet Take 1 tablet (10 mg total) by mouth every 6 (six) hours as needed for nausea or vomiting. 30 tablet 2   REVLIMID 25 MG capsule TAKE 1 CAPSULE BY MOUTH ONCE DAILY FOR 14 DAYS ON AND 7 DAYS OFF 14 capsule 0   No current facility-administered medications for this encounter.    ECOG PERFORMANCE STATUS:  1 - Symptomatic but completely ambulatory  REVIEW OF SYSTEMS: Patient denies any weight loss, fatigue, weakness, fever, chills or night sweats. Patient denies any loss  of vision, blurred vision. Patient denies any ringing  of the ears or hearing loss. No irregular heartbeat. Patient denies heart murmur or history of fainting. Patient denies any chest pain or pain radiating to her upper extremities. Patient denies any shortness of breath, difficulty breathing at night, cough or hemoptysis. Patient denies any swelling in the lower legs. Patient denies any nausea vomiting, vomiting of blood, or coffee ground material in the  vomitus. Patient denies any stomach pain. Patient states has had normal bowel movements no significant constipation or diarrhea. Patient denies any dysuria, hematuria or significant nocturia. Patient denies any problems walking, swelling in the joints or loss of balance. Patient denies any skin changes, loss of hair or loss of weight. Patient denies any excessive worrying or anxiety or significant depression. Patient denies any problems with insomnia. Patient denies excessive thirst, polyuria, polydipsia. Patient denies any swollen glands, patient denies easy bruising or easy bleeding. Patient denies any recent infections, allergies or URI. Patient "s visual fields have not changed significantly in recent time.   PHYSICAL EXAM: BP 122/85   Pulse 96   Temp (!) 96 F (35.6 C)   Resp 18   Ht 6' (1.829 m)   Wt 225 lb (102.1 kg)   BMI 30.52 kg/m  Deep palpation of the spine does not elicit pain motor and sensory levels are equal and symmetric in upper lower extremities.  Well-developed well-nourished patient in NAD. HEENT reveals PERLA, EOMI, discs not visualized.  Oral cavity is clear. No oral mucosal lesions are identified. Neck is clear without evidence of cervical or supraclavicular adenopathy. Lungs are clear to A&P. Cardiac examination is essentially unremarkable with regular rate and rhythm without murmur rub or thrill. Abdomen is benign with no organomegaly or masses noted. Motor sensory and DTR levels are equal and symmetric in the upper and lower extremities. Cranial nerves II through XII are grossly intact. Proprioception is intact. No peripheral adenopathy or edema is identified. No motor or sensory levels are noted. Crude visual fields are within normal range.  LABORATORY DATA: Pathology reports reviewed    RADIOLOGY RESULTS: CT scan and PET CT scan reviewed compatible with above-stated findings   IMPRESSION: Widespread involvement of multiple myeloma IgG in 35-year-old male  PLAN: At  this time would hold radiation therapy for more severe symptoms.  His left iliac region may need palliative radiation start therapy at some time although this will decrease success of bone marrow transplant which I think may be in his persons future.  Some recent studies have shown decreased need for palliative radiation therapy multiple myeloma should they respond to treatment.  Will reevaluate him at any time should his pain become more severe.  We will discuss the case personally with Dr. Yu.  I would like to take this opportunity to thank you for allowing me to participate in the care of your patient..   , MD         

## 2021-10-01 NOTE — Progress Notes (Signed)
BIS error - entering note on RNs behalf to be able to close encounter.

## 2021-10-03 ENCOUNTER — Ambulatory Visit: Payer: Commercial Managed Care - PPO

## 2021-10-06 ENCOUNTER — Other Ambulatory Visit: Payer: Self-pay

## 2021-10-07 ENCOUNTER — Inpatient Hospital Stay: Payer: Commercial Managed Care - PPO | Attending: Oncology

## 2021-10-07 ENCOUNTER — Inpatient Hospital Stay (HOSPITAL_BASED_OUTPATIENT_CLINIC_OR_DEPARTMENT_OTHER): Payer: Commercial Managed Care - PPO | Admitting: Oncology

## 2021-10-07 ENCOUNTER — Ambulatory Visit: Payer: Commercial Managed Care - PPO

## 2021-10-07 ENCOUNTER — Inpatient Hospital Stay: Payer: Commercial Managed Care - PPO

## 2021-10-07 ENCOUNTER — Encounter: Payer: Self-pay | Admitting: Oncology

## 2021-10-07 VITALS — BP 105/74 | HR 81 | Temp 97.6°F | Resp 18 | Ht 72.0 in | Wt 230.0 lb

## 2021-10-07 DIAGNOSIS — C7951 Secondary malignant neoplasm of bone: Secondary | ICD-10-CM | POA: Diagnosis not present

## 2021-10-07 DIAGNOSIS — Z5112 Encounter for antineoplastic immunotherapy: Secondary | ICD-10-CM | POA: Diagnosis present

## 2021-10-07 DIAGNOSIS — C9000 Multiple myeloma not having achieved remission: Secondary | ICD-10-CM

## 2021-10-07 DIAGNOSIS — Z5111 Encounter for antineoplastic chemotherapy: Secondary | ICD-10-CM | POA: Diagnosis not present

## 2021-10-07 DIAGNOSIS — G8929 Other chronic pain: Secondary | ICD-10-CM

## 2021-10-07 DIAGNOSIS — M545 Low back pain, unspecified: Secondary | ICD-10-CM

## 2021-10-07 DIAGNOSIS — N189 Chronic kidney disease, unspecified: Secondary | ICD-10-CM | POA: Diagnosis not present

## 2021-10-07 LAB — CBC WITH DIFFERENTIAL/PLATELET
Abs Immature Granulocytes: 0.01 10*3/uL (ref 0.00–0.07)
Basophils Absolute: 0 10*3/uL (ref 0.0–0.1)
Basophils Relative: 1 %
Eosinophils Absolute: 0 10*3/uL (ref 0.0–0.5)
Eosinophils Relative: 1 %
HCT: 27.9 % — ABNORMAL LOW (ref 39.0–52.0)
Hemoglobin: 9.6 g/dL — ABNORMAL LOW (ref 13.0–17.0)
Immature Granulocytes: 0 %
Lymphocytes Relative: 31 %
Lymphs Abs: 1.3 10*3/uL (ref 0.7–4.0)
MCH: 28.6 pg (ref 26.0–34.0)
MCHC: 34.4 g/dL (ref 30.0–36.0)
MCV: 83 fL (ref 80.0–100.0)
Monocytes Absolute: 1 10*3/uL (ref 0.1–1.0)
Monocytes Relative: 23 %
Neutro Abs: 2 10*3/uL (ref 1.7–7.7)
Neutrophils Relative %: 44 %
Platelets: 333 10*3/uL (ref 150–400)
RBC: 3.36 MIL/uL — ABNORMAL LOW (ref 4.22–5.81)
RDW: 17.6 % — ABNORMAL HIGH (ref 11.5–15.5)
WBC: 4.4 10*3/uL (ref 4.0–10.5)
nRBC: 0 % (ref 0.0–0.2)

## 2021-10-07 LAB — SAMPLE TO BLOOD BANK

## 2021-10-07 LAB — COMPREHENSIVE METABOLIC PANEL
ALT: 49 U/L — ABNORMAL HIGH (ref 0–44)
AST: 25 U/L (ref 15–41)
Albumin: 2.8 g/dL — ABNORMAL LOW (ref 3.5–5.0)
Alkaline Phosphatase: 295 U/L — ABNORMAL HIGH (ref 38–126)
Anion gap: 3 — ABNORMAL LOW (ref 5–15)
BUN: 10 mg/dL (ref 6–20)
CO2: 26 mmol/L (ref 22–32)
Calcium: 8.5 mg/dL — ABNORMAL LOW (ref 8.9–10.3)
Chloride: 105 mmol/L (ref 98–111)
Creatinine, Ser: 0.8 mg/dL (ref 0.61–1.24)
GFR, Estimated: >60 mL/min (ref >60)
Glucose, Bld: 90 mg/dL (ref 70–99)
Potassium: 4 mmol/L (ref 3.5–5.1)
Sodium: 134 mmol/L — ABNORMAL LOW (ref 135–145)
Total Bilirubin: 0.8 mg/dL (ref 0.3–1.2)
Total Protein: 7.6 g/dL (ref 6.5–8.1)

## 2021-10-07 MED ORDER — ACETAMINOPHEN 325 MG PO TABS
650.0000 mg | ORAL_TABLET | Freq: Once | ORAL | Status: AC
  Administered 2021-10-07: 650 mg via ORAL

## 2021-10-07 MED ORDER — MONTELUKAST SODIUM 10 MG PO TABS
10.0000 mg | ORAL_TABLET | Freq: Once | ORAL | Status: AC
  Administered 2021-10-07: 10 mg via ORAL

## 2021-10-07 MED ORDER — DIPHENHYDRAMINE HCL 25 MG PO CAPS
50.0000 mg | ORAL_CAPSULE | Freq: Once | ORAL | Status: AC
  Administered 2021-10-07: 50 mg via ORAL

## 2021-10-07 MED ORDER — DEXAMETHASONE 4 MG PO TABS
20.0000 mg | ORAL_TABLET | Freq: Once | ORAL | Status: AC
  Administered 2021-10-07: 20 mg via ORAL

## 2021-10-07 MED ORDER — BORTEZOMIB CHEMO SQ INJECTION 3.5 MG (2.5MG/ML)
1.3000 mg/m2 | Freq: Once | INTRAMUSCULAR | Status: AC
  Administered 2021-10-07: 3 mg via SUBCUTANEOUS
  Filled 2021-10-07: qty 1.2

## 2021-10-07 MED ORDER — DARATUMUMAB-HYALURONIDASE-FIHJ 1800-30000 MG-UT/15ML ~~LOC~~ SOLN
1800.0000 mg | Freq: Once | SUBCUTANEOUS | Status: AC
  Administered 2021-10-07: 1800 mg via SUBCUTANEOUS
  Filled 2021-10-07: qty 15

## 2021-10-07 NOTE — Assessment & Plan Note (Addendum)
PET scan showed multiple osseous lesions.  Recommend dental clearance for bone strengthening agents- Dentist recommends wisdom teeth extraction.  Currently pain is well controlled. Discussed with Neurosurgeon, no intervention needed.  Radonc recommend holding off RT for now.

## 2021-10-07 NOTE — Assessment & Plan Note (Signed)
Chemotherapy plan as listed above 

## 2021-10-07 NOTE — Assessment & Plan Note (Signed)
Avoid nephrotoxins, encourage oral hydration 

## 2021-10-07 NOTE — Progress Notes (Signed)
Hematology/Oncology Progress note [phone] [phone]        REFERRING PROVIDER: , , MD    ASSESSMENT & PLAN:   Cancer Staging  Multiple myeloma not having achieved remission (HCC) Staging form: Plasma Cell Myeloma and Plasma Cell Disorders, AJCC 8th Edition - Clinical stage from 08/20/2021: Beta-2-microglobulin (mg/L): 3.6, Albumin (g/dL): 2.2, ISS: Stage II, High-risk cytogenetics: Unknown, LDH: Normal - Signed by , , MD on 08/25/2021   Multiple myeloma not having achieved remission (HCC) # IgG lamda multiple myeloma On chemotherapy plan with lenalidomide (25 mg daily on days 1-14), subcutaneous bortezomib (1.3 mg/m2 on days 1, 4, 8, and 11), and oral dexamethasone (20 mg on days 1, 2, 8, 9, 15, and 16). SubQ daratumumab on days 1, 8, and 15 of cycles 1 through 4 and day 1 of post-ASCT consolidation cycles (cycles 5 and 6).   Labs are reviewed and discussed with patient. Proceed with Daratumumab-Velcade Revlimid Continue  Aspirin 81mg daily  #Hypoalbuminemia, follow up with nutritionist #Hyperuricemia, continue allopurinol  Metastatic cancer to bone (HCC) PET scan showed multiple osseous lesions.  Recommend dental clearance for bone strengthening agents- Dentist recommends wisdom teeth extraction.  Currently pain is well controlled. Discussed with Neurosurgeon, no intervention needed.  Radonc recommend holding off RT for now.   Encounter for antineoplastic chemotherapy Chemotherapy plan as listed above.   CKD (chronic kidney disease) Avoid nephrotoxins, encourage oral hydration  Back pain Recommend Tylenol 3 every 6 hours as needed.      Recommend patient to obtain dental clearance Patient gets dexamethasone 20mg on Day 1, 8, 15 along with his treatment. . Recommend him to take oral dexamethasone 20 mg, day 2, 9,16.   Orders Placed This Encounter  Procedures   TREATMENT CONDITIONS    Patient should have CBC & CMP within 7  days prior to chemotherapy administration. NOTIFY MD IF: ANC < 1000, Hemoglobin < 8, PLT < 100,000,  Total Bili > 1.5, Creatinine > 3, ALT & AST > 80 or if patient has unstable vital signs: Temperature >= 100.4F, SBP > 180 or < 90, RR > 30 or HR > 110.    Follow up  1 week lab MD Dara Velcade- All questions were answered. The patient knows to call the clinic with any problems, questions or concerns. No barriers to learning was detected.   , MD 10/07/2021   CHIEF COMPLAINTS/PURPOSE OF CONSULTATION:  Multiple myeloma  HISTORY OF PRESENTING ILLNESS:  Gurney Brandon Wiedemann 36 y.o. male presents  for follow up of Multiple myeloma I have reviewed his chart and materials related to his cancer extensively and collaborated history with the patient. Summary of oncologic history is as follows: Oncology History  Multiple myeloma not having achieved remission (HCC)  08/14/2021 Imaging   CT chest abdomen pelvis without contrast Showed numerous lucencies throughout the bone structures, most pronounced throughout the spine and left left iliac bone.  Otherwise no acute findings in the chest abdomen or pelvis.  Sigmoid diverticulosis.   08/19/2021 Initial Diagnosis   Multiple myeloma not having achieved remission, stage II  -08/15/2021, bone marrow biopsy showed plasma cell myeloma involving greater than 80% of the marrow. Cytogenetics Myeloma  FISH   08/20/2021 Cancer Staging   Staging form: Plasma Cell Myeloma and Plasma Cell Disorders, AJCC 8th Edition - Clinical stage from 08/20/2021: Beta-2-microglobulin (mg/L): 3.6, Albumin (g/dL): 2.2, ISS: Stage II, High-risk cytogenetics: Unknown, LDH: Normal - Signed by , , MD on 08/25/2021 Beta 2 microglobulin range (mg/L):   3.5 to 5.49 Albumin range (g/dL): Less than 3.5 Cytogenetics: Unknown   08/20/2021 -  Chemotherapy   MYELOMA NEWLY DIAGNOSED TRANSPLANT CANDIDATE DaraVRd (Daratumumab SUBQ) q21d x 6 Cycles (Induction/Consolidation)       09/11/2021 Imaging   PET scan showed  1. Extensive multifocal hypermetabolic disease throughout the axial and appendicular skeleton, with numerous lucent osseous lesions,consistent with patient's known diagnosis of multiple myeloma. 2. No tracer avid lymph node or mass identified to suggest soft tissue plasmacytoma     INTERVAL HISTORY Cameo Brandon Ressler is a 36 y.o. male who has above history reviewed by me today presents for follow up visit for management of multiple myeloma Denies any neuropathy symptoms. Tolerates Revlimid, no diarrhea + chronic back pain stable.  + fatigue, stable.  No nausea vomiting diarrhea.  Denies numbness and tingling He was seen by dentist and was recommended to have impacted wisdom teeth extracted.   MEDICAL HISTORY:  Past Medical History:  Diagnosis Date   Alcohol use    Sciatica    Smoking     SURGICAL HISTORY: Past Surgical History:  Procedure Laterality Date   INGUINAL HERNIA REPAIR      SOCIAL HISTORY: Social History   Socioeconomic History   Marital status: Single    Spouse name: Not on file   Number of children: Not on file   Years of education: Not on file   Highest education level: Not on file  Occupational History   Not on file  Tobacco Use   Smoking status: Former    Packs/day: 0.50    Years: 10.00    Total pack years: 5.00    Types: E-cigarettes, Cigarettes    Quit date: 08/11/2021    Years since quitting: 0.1   Smokeless tobacco: Never  Vaping Use   Vaping Use: Every day  Substance and Sexual Activity   Alcohol use: Not Currently    Alcohol/week: 24.0 standard drinks of alcohol    Types: 24 Cans of beer per week    Comment: quit drinking approx 08/11/2021   Drug use: Never   Sexual activity: Not on file  Other Topics Concern   Not on file  Social History Narrative   Not on file   Social Determinants of Health   Financial Resource Strain: Not on file  Food Insecurity: Not on file  Transportation Needs: Not  on file  Physical Activity: Not on file  Stress: Not on file  Social Connections: Not on file  Intimate Partner Violence: Not on file    FAMILY HISTORY: Family History  Problem Relation Age of Onset   Ovarian cancer Mother    Asthma Mother    Alcoholism Father    Throat cancer Maternal Uncle    Heart disease Maternal Uncle    Lung cancer Maternal Uncle    Lymphoma Maternal Uncle    Lung cancer Paternal Grandmother     ALLERGIES:  has No Known Allergies.  MEDICATIONS:  Current Outpatient Medications  Medication Sig Dispense Refill   acetaminophen-codeine (TYLENOL #3) 300-30 MG tablet Take 1 tablet by mouth every 6 (six) hours as needed for moderate pain. 30 tablet 0   acyclovir (ZOVIRAX) 400 MG tablet Take 1 tablet (400 mg total) by mouth 2 (two) times daily. 60 tablet 5   allopurinol (ZYLOPRIM) 100 MG tablet Take 0.5 tablets (50 mg total) by mouth daily. 30 tablet 2   dexamethasone (DECADRON) 4 MG tablet Take 5 tablets (20 mg total) by mouth See admin instructions. take   dexamethasone 20 mg, day 2, 9,16. 60 tablet 0   ondansetron (ZOFRAN) 8 MG tablet Take 1 tablet (8 mg total) by mouth 2 (two) times daily as needed for nausea or vomiting. 20 tablet 2   potassium chloride SA (KLOR-CON M) 20 MEQ tablet Take 1 tablet (20 mEq total) by mouth daily. 30 tablet 0   prochlorperazine (COMPAZINE) 10 MG tablet Take 1 tablet (10 mg total) by mouth every 6 (six) hours as needed for nausea or vomiting. 30 tablet 2   REVLIMID 25 MG capsule TAKE 1 CAPSULE BY MOUTH ONCE DAILY FOR 14 DAYS ON AND 7 DAYS OFF 14 capsule 0   Current Facility-Administered Medications  Medication Dose Route Frequency Provider Last Rate Last Admin   bortezomib SQ (VELCADE) chemo injection (2.85m/mL concentration) 3 mg  1.3 mg/m2 (Treatment Plan Recorded) Subcutaneous Once YEarlie Server MD       daratumumab-hyaluronidase-fihj ([facility]) 1800-30000 MG-UT/15ML chemo SQ injection 1,800 mg  1,800 mg Subcutaneous Once YEarlie Server MD        Review of Systems  Constitutional:  Positive for fatigue. Negative for appetite change, chills, fever and unexpected weight change.  HENT:   Negative for hearing loss and voice change.   Eyes:  Negative for eye problems and icterus.  Respiratory:  Negative for chest tightness, cough and shortness of breath.   Cardiovascular:  Negative for chest pain and leg swelling.  Gastrointestinal:  Negative for abdominal distention and abdominal pain.  Endocrine: Negative for hot flashes.  Genitourinary:  Negative for difficulty urinating, dysuria and frequency.   Musculoskeletal:  Positive for back pain. Negative for arthralgias.  Skin:  Negative for itching and rash.  Neurological:  Negative for light-headedness and numbness.  Hematological:  Negative for adenopathy. Does not bruise/bleed easily.  Psychiatric/Behavioral:  Negative for confusion.      PHYSICAL EXAMINATION: ECOG PERFORMANCE STATUS: 1 - Symptomatic but completely ambulatory  Vitals:   10/07/21 0858 10/07/21 0925  BP: 105/74   Pulse: 81   Resp: 18   Temp:  97.6 F (36.4 C)  SpO2: 100%    Filed Weights   10/07/21 0855  Weight: 230 lb (104.3 kg)    Physical Exam Constitutional:      General: He is not in acute distress.    Appearance: He is obese. He is not diaphoretic.  HENT:     Head: Normocephalic and atraumatic.     Nose: Nose normal.     Mouth/Throat:     Pharynx: No oropharyngeal exudate.  Eyes:     General: No scleral icterus.    Pupils: Pupils are equal, round, and reactive to light.  Cardiovascular:     Rate and Rhythm: Normal rate.     Heart sounds: No murmur heard. Pulmonary:     Effort: Pulmonary effort is normal. No respiratory distress.     Breath sounds: No rales.  Chest:     Chest wall: No tenderness.  Abdominal:     General: There is no distension.     Palpations: Abdomen is soft.     Tenderness: There is no abdominal tenderness.  Musculoskeletal:        General: Normal  range of motion.     Cervical back: Normal range of motion and neck supple.  Skin:    General: Skin is warm and dry.     Findings: No erythema.  Neurological:     Mental Status: He is alert and oriented to person, place, and time.  Cranial Nerves: No cranial nerve deficit.     Motor: No abnormal muscle tone.     Coordination: Coordination normal.  Psychiatric:        Mood and Affect: Mood and affect normal.      LABORATORY DATA:  I have reviewed the data as listed    Latest Ref Rng & Units 10/07/2021    8:17 AM 09/29/2021    1:36 PM 09/23/2021    8:58 AM  CBC  WBC 4.0 - 10.5 K/uL 4.4  2.5  2.1   Hemoglobin 13.0 - 17.0 g/dL 9.6  9.2  8.4   Hematocrit 39.0 - 52.0 % 27.9  26.9  24.7   Platelets 150 - 400 K/uL 333  211  196       Latest Ref Rng & Units 10/07/2021    8:17 AM 09/29/2021    1:36 PM 09/23/2021    8:58 AM  CMP  Glucose 70 - 99 mg/dL 90  108  120   BUN 6 - 20 mg/dL _0 Creatinine 0.61 - 1.24 mg/dL 0.80  0.78  1.03   Sodium 135 - 145 mmol/L 134  131  130   Potassium 3.5 - 5.1 mmol/L 4.0  3.2  3.2   Chloride 98 - 111 mmol/L 105  100  104   CO2 22 - 32 mmol/L _1 Calcium 8.9 - 10.3 mg/dL 8.5  7.8  8.2   Total Protein 6.5 - 8.1 g/dL 7.6  8.3  10.8   Total Bilirubin 0.3 - 1.2 mg/dL 0.8  0.7  0.3   Alkaline Phos 38 - 126 U/L 295  200  95   AST 15 - 41 U/L _2 ALT 0 - 44 U/L 49  64  26        RADIOGRAPHIC STUDIES: I have personally reviewed the radiological images as listed and agreed with the findings in the report. ECHOCARDIOGRAM COMPLETE  Result Date: 09/24/2021    ECHOCARDIOGRAM REPORT   Patient Name:   LYNN SISSEL [facility] Date of Exam: 09/24/2021 Medical Rec #:  [id number]                 Height:       72.0 in Accession #:    [id number]                Weight:       220.6 lb Date of Birth:  Oct 26, 1985                BSA:          2.221 m Patient Age:    36 years                  BP:           127/76 mmHg Patient Gender: M                          HR:           87 bpm. Exam Location:  [facility] Procedure: 2D Echo, Color Doppler and Cardiac Doppler Indications:     Tachycardia  History:         Patient has no prior history of Echocardiogram examinations.                  Risk Factors:Former Smoker.  Sonographer:     Remo Lipps  Heiss Referring Phys:  [id number] Roberts Diagnosing Phys: Serafina Royals MD IMPRESSIONS  1. Left ventricular ejection fraction, by estimation, is 55 to 60%. The left ventricle has normal function. The left ventricle has no regional wall motion abnormalities. Left ventricular diastolic parameters were normal.  2. Right ventricular systolic function is normal. The right ventricular size is normal.  3. The mitral valve is normal in structure. Trivial mitral valve regurgitation.  4. The aortic valve is normal in structure. Aortic valve regurgitation is not visualized. FINDINGS  Left Ventricle: Left ventricular ejection fraction, by estimation, is 55 to 60%. The left ventricle has normal function. The left ventricle has no regional wall motion abnormalities. The left ventricular internal cavity size was normal in size. There is  no left ventricular hypertrophy. Left ventricular diastolic parameters were normal. Right Ventricle: The right ventricular size is normal. No increase in right ventricular wall thickness. Right ventricular systolic function is normal. Left Atrium: Left atrial size was normal in size. Right Atrium: Right atrial size was normal in size. Pericardium: There is no evidence of pericardial effusion. Mitral Valve: The mitral valve is normal in structure. Trivial mitral valve regurgitation. Tricuspid Valve: The tricuspid valve is normal in structure. Tricuspid valve regurgitation is mild. Aortic Valve: The aortic valve is normal in structure. Aortic valve regurgitation is not visualized. Aortic valve mean gradient measures 4.0 mmHg. Aortic valve peak gradient measures 7.0 mmHg. Aortic valve area, by VTI measures 3.10 cm.  Pulmonic Valve: The pulmonic valve was normal in structure. Pulmonic valve regurgitation is trivial. Aorta: The aortic root and ascending aorta are structurally normal, with no evidence of dilitation. IAS/Shunts: No atrial level shunt detected by color flow Doppler.  LEFT VENTRICLE PLAX 2D LVIDd:         4.90 cm   Diastology LVIDs:         3.20 cm   LV e' medial:    9.03 cm/s LV PW:         0.90 cm   LV E/e' medial:  6.0 LV IVS:        0.80 cm   LV e' lateral:   15.70 cm/s LVOT diam:     2.00 cm   LV E/e' lateral: 3.5 LV SV:         67 LV SV Index:   30 LVOT Area:     3.14 cm  RIGHT VENTRICLE RV Basal diam:  3.60 cm RV Mid diam:    3.00 cm TAPSE (M-mode): 2.5 cm LEFT ATRIUM             Index        RIGHT ATRIUM           Index LA diam:        4.00 cm 1.80 cm/m   RA Area:     13.80 cm LA Vol (A2C):   38.1 ml 17.15 ml/m  RA Volume:   29.20 ml  13.15 ml/m LA Vol (A4C):   37.1 ml 16.70 ml/m LA Biplane Vol: 37.7 ml 16.97 ml/m  AORTIC VALVE AV Area (Vmax):    2.74 cm AV Area (Vmean):   2.74 cm AV Area (VTI):     3.10 cm AV Vmax:           132.00 cm/s AV Vmean:          88.200 cm/s AV VTI:            0.216 m AV Peak Grad:  7.0 mmHg AV Mean Grad:      4.0 mmHg LVOT Vmax:         115.00 cm/s LVOT Vmean:        77.000 cm/s LVOT VTI:          0.213 m LVOT/AV VTI ratio: 0.99  AORTA Ao Root diam: 3.60 cm MITRAL VALVE               TRICUSPID VALVE MV Area (PHT): 4.06 cm    TR Peak grad:   18.0 mmHg MV Decel Time: 187 msec    TR Vmax:        212.00 cm/s MV E velocity: 54.50 cm/s MV A velocity: 79.10 cm/s  SHUNTS MV E/A ratio:  0.69        Systemic VTI:  0.21 m                            Systemic Diam: 2.00 cm Serafina Royals MD Electronically signed by Serafina Royals MD Signature Date/Time: 09/24/2021/12:12:34 PM    Final    NM PET Image Initial (PI) Whole Body  Result Date: 09/11/2021 CLINICAL DATA:  Initial treatment strategy for multiple myeloma. EXAM: NUCLEAR MEDICINE PET WHOLE BODY TECHNIQUE: 12.5 mCi F-18 FDG  was injected intravenously. Full-ring PET imaging was performed from the head to foot after the radiotracer. CT data was obtained and used for attenuation correction and anatomic localization. Fasting blood glucose: 99 mg/dl COMPARISON:  CT August 14, 2021 FINDINGS: Mediastinal blood pool activity: SUV max 2.11 HEAD/NECK: Hypermetabolic activity in the clivus, occipital condyles, and left maxilla. For reference lucency throughout the clivus with max SUV of 13.1. There is no hypermetabolic cervical lymph nodes. Incidental CT findings: none CHEST: No hypermetabolic mediastinal or hilar nodes. No suspicious pulmonary nodules on the CT scan. Incidental CT findings: none ABDOMEN/PELVIS: No abnormal hypermetabolic activity within the liver, pancreas, adrenal glands, or spleen. No hypermetabolic lymph nodes in the abdomen or pelvis. Incidental CT findings: Multifocal soft tissue subcutaneous throughout the anterior abdominal wall do not demonstrate significant abnormal FDG avidity and most likely reflects sequela of subcutaneous injections. SKELETON: Extensive hypermetabolic osseous activity with multifocal lucent osseous lesions throughout the visualized axial and appendicular skeleton. For reference: - max SUV in the T12 vertebral body is 10.7 - max SUV in the right hemisacrum of 11.05. -large lucent lesion in the left iliac bone which demonstrates cortical break on image 98/2 with a max SUV of 11.2. Incidental CT findings: none EXTREMITIES: Hypermetabolic foci in the bilateral femurs for instance in the left femur max SUV of 12.9. Incidental CT findings: none IMPRESSION: 1. Extensive multifocal hypermetabolic disease throughout the axial and appendicular skeleton, with numerous lucent osseous lesions, consistent with patient's known diagnosis of multiple myeloma. 2. No tracer avid lymph node or mass identified to suggest soft tissue plasmacytoma Electronically Signed   By: Dahlia Bailiff M.D.   On: 09/11/2021 11:51

## 2021-10-07 NOTE — Assessment & Plan Note (Addendum)
#  IgG lamda multiple myeloma On chemotherapy plan with lenalidomide (25 mg daily on days 1-14), subcutaneous bortezomib (1.3 mg/m2 on days 1, 4, 8, and 11), and oral dexamethasone (20 mg on days 1, 2, 8, 9, 15, and 16). SubQ daratumumab on days 1, 8, and 15 of cycles 1 through 4 and day 1 of post-ASCT consolidation cycles (cycles 5 and 6).   Labs are reviewed and discussed with patient. Proceed with Daratumumab-Velcade Revlimid Continue  Aspirin 59m daily  #Hypoalbuminemia, follow up with nutritionist #Hyperuricemia, continue allopurinol

## 2021-10-07 NOTE — Patient Instructions (Signed)
[facility] CANCER CTR AT [place]  Discharge Instructions: Thank you for choosing [facility] to provide your oncology and hematology care.  If you have a lab appointment with the [facility], please go directly to the [facility] and check in at the registration area.  Wear comfortable clothing and clothing appropriate for easy access to any Portacath or PICC line.   We strive to give you quality time with your provider. You may need to reschedule your appointment if you arrive late (15 or more minutes).  Arriving late affects you and other patients whose appointments are after yours.  Also, if you miss three or more appointments without notifying the office, you may be dismissed from the clinic at the provider's discretion.      For prescription refill requests, have your pharmacy contact our office and allow 72 hours for refills to be completed.    Today you received the following chemotherapy and/or immunotherapy agents: Darzalex, Velcade      To help prevent nausea and vomiting after your treatment, we encourage you to take your nausea medication as directed.  BELOW ARE SYMPTOMS THAT SHOULD BE REPORTED IMMEDIATELY: *FEVER GREATER THAN 100.4 F (38 C) OR HIGHER *CHILLS OR SWEATING *NAUSEA AND VOMITING THAT IS NOT CONTROLLED WITH YOUR NAUSEA MEDICATION *UNUSUAL SHORTNESS OF BREATH *UNUSUAL BRUISING OR BLEEDING *URINARY PROBLEMS (pain or burning when urinating, or frequent urination) *BOWEL PROBLEMS (unusual diarrhea, constipation, pain near the anus) TENDERNESS IN MOUTH AND THROAT WITH OR WITHOUT PRESENCE OF ULCERS (sore throat, sores in mouth, or a toothache) UNUSUAL RASH, SWELLING OR PAIN  UNUSUAL VAGINAL DISCHARGE OR ITCHING   Items with * indicate a potential emergency and should be followed up as soon as possible or go to the Emergency Department if any problems should occur.  Please show the CHEMOTHERAPY ALERT CARD or IMMUNOTHERAPY ALERT CARD at  check-in to the Emergency Department and triage nurse.  Should you have questions after your visit or need to cancel or reschedule your appointment, please contact [facility] CANCER [place] AT [place]  [phone] and follow the prompts.  Office hours are 8:00 a.m. to 4:30 p.m. Monday - Friday. Please note that voicemails left after 4:00 p.m. may not be returned until the following business day.  We are closed weekends and major holidays. You have access to a nurse at all times for urgent questions. Please call the main number to the clinic [phone] and follow the prompts.  For any non-urgent questions, you may also contact your provider using MyChart. We now offer e-Visits for anyone 17 and older to request care online for non-urgent symptoms. For details visit [email or web address].   Also download the MyChart app! Go to the app store, search "MyChart", open the app, select [facility], and log in with your MyChart username and password.  Masks are optional in the cancer centers. If you would like for your care team to wear a mask while they are taking care of you, please let them know. For doctor visits, patients may have with them one support person who is at least 35 years old. At this time, visitors are not allowed in the infusion area.

## 2021-10-07 NOTE — Assessment & Plan Note (Signed)
Recommend Tylenol 3 every 6 hours as needed.

## 2021-10-08 ENCOUNTER — Ambulatory Visit: Payer: Commercial Managed Care - PPO

## 2021-10-08 LAB — KAPPA/LAMBDA LIGHT CHAINS
Kappa free light chain: 6.9 mg/L (ref 3.3–19.4)
Kappa, lambda light chain ratio: 0.84 (ref 0.26–1.65)
Lambda free light chains: 8.2 mg/L (ref 5.7–26.3)

## 2021-10-10 ENCOUNTER — Inpatient Hospital Stay: Payer: Commercial Managed Care - PPO

## 2021-10-10 VITALS — BP 121/83 | HR 96 | Temp 99.2°F | Resp 20 | Wt 230.0 lb

## 2021-10-10 DIAGNOSIS — Z5112 Encounter for antineoplastic immunotherapy: Secondary | ICD-10-CM | POA: Diagnosis not present

## 2021-10-10 DIAGNOSIS — C9000 Multiple myeloma not having achieved remission: Secondary | ICD-10-CM

## 2021-10-10 MED ORDER — PROCHLORPERAZINE MALEATE 10 MG PO TABS
10.0000 mg | ORAL_TABLET | Freq: Once | ORAL | Status: AC
  Administered 2021-10-10: 10 mg via ORAL
  Filled 2021-10-10: qty 1

## 2021-10-10 MED ORDER — BORTEZOMIB CHEMO SQ INJECTION 3.5 MG (2.5MG/ML)
1.3000 mg/m2 | Freq: Once | INTRAMUSCULAR | Status: AC
  Administered 2021-10-10: 3 mg via SUBCUTANEOUS
  Filled 2021-10-10: qty 1.2

## 2021-10-13 LAB — MULTIPLE MYELOMA PANEL, SERUM
Albumin SerPl Elph-Mcnc: 3 g/dL (ref 2.9–4.4)
Albumin/Glob SerPl: 0.8 (ref 0.7–1.7)
Alpha 1: 0.3 g/dL (ref 0.0–0.4)
Alpha2 Glob SerPl Elph-Mcnc: 0.6 g/dL (ref 0.4–1.0)
B-Globulin SerPl Elph-Mcnc: 1 g/dL (ref 0.7–1.3)
Gamma Glob SerPl Elph-Mcnc: 2 g/dL — ABNORMAL HIGH (ref 0.4–1.8)
Globulin, Total: 3.9 g/dL (ref 2.2–3.9)
IgA: 28 mg/dL — ABNORMAL LOW (ref 90–386)
IgG (Immunoglobin G), Serum: 2236 mg/dL — ABNORMAL HIGH (ref 603–1613)
IgM (Immunoglobulin M), Srm: 58 mg/dL (ref 20–172)
M Protein SerPl Elph-Mcnc: 1.9 g/dL — ABNORMAL HIGH
Total Protein ELP: 6.9 g/dL (ref 6.0–8.5)

## 2021-10-14 ENCOUNTER — Encounter: Payer: Self-pay | Admitting: Oncology

## 2021-10-14 ENCOUNTER — Inpatient Hospital Stay: Payer: Commercial Managed Care - PPO

## 2021-10-14 ENCOUNTER — Inpatient Hospital Stay (HOSPITAL_BASED_OUTPATIENT_CLINIC_OR_DEPARTMENT_OTHER): Payer: Commercial Managed Care - PPO | Admitting: Oncology

## 2021-10-14 VITALS — BP 120/80 | HR 93 | Temp 98.8°F | Resp 18

## 2021-10-14 DIAGNOSIS — Z5111 Encounter for antineoplastic chemotherapy: Secondary | ICD-10-CM

## 2021-10-14 DIAGNOSIS — C9000 Multiple myeloma not having achieved remission: Secondary | ICD-10-CM

## 2021-10-14 DIAGNOSIS — C7951 Secondary malignant neoplasm of bone: Secondary | ICD-10-CM | POA: Diagnosis not present

## 2021-10-14 DIAGNOSIS — Z5112 Encounter for antineoplastic immunotherapy: Secondary | ICD-10-CM | POA: Diagnosis not present

## 2021-10-14 DIAGNOSIS — N189 Chronic kidney disease, unspecified: Secondary | ICD-10-CM | POA: Diagnosis not present

## 2021-10-14 LAB — COMPREHENSIVE METABOLIC PANEL
ALT: 47 U/L — ABNORMAL HIGH (ref 0–44)
AST: 19 U/L (ref 15–41)
Albumin: 3.2 g/dL — ABNORMAL LOW (ref 3.5–5.0)
Alkaline Phosphatase: 220 U/L — ABNORMAL HIGH (ref 38–126)
Anion gap: 4 — ABNORMAL LOW (ref 5–15)
BUN: 8 mg/dL (ref 6–20)
CO2: 30 mmol/L (ref 22–32)
Calcium: 8.1 mg/dL — ABNORMAL LOW (ref 8.9–10.3)
Chloride: 103 mmol/L (ref 98–111)
Creatinine, Ser: 0.79 mg/dL (ref 0.61–1.24)
GFR, Estimated: >60 mL/min (ref >60)
Glucose, Bld: 96 mg/dL (ref 70–99)
Potassium: 3.3 mmol/L — ABNORMAL LOW (ref 3.5–5.1)
Sodium: 137 mmol/L (ref 135–145)
Total Bilirubin: 1.2 mg/dL (ref 0.3–1.2)
Total Protein: 6.9 g/dL (ref 6.5–8.1)

## 2021-10-14 LAB — CBC WITH DIFFERENTIAL/PLATELET
Abs Immature Granulocytes: 0.03 10*3/uL (ref 0.00–0.07)
Basophils Absolute: 0 10*3/uL (ref 0.0–0.1)
Basophils Relative: 0 %
Eosinophils Absolute: 0.2 10*3/uL (ref 0.0–0.5)
Eosinophils Relative: 4 %
HCT: 28.2 % — ABNORMAL LOW (ref 39.0–52.0)
Hemoglobin: 9.4 g/dL — ABNORMAL LOW (ref 13.0–17.0)
Immature Granulocytes: 1 %
Lymphocytes Relative: 19 %
Lymphs Abs: 0.8 10*3/uL (ref 0.7–4.0)
MCH: 28.5 pg (ref 26.0–34.0)
MCHC: 33.3 g/dL (ref 30.0–36.0)
MCV: 85.5 fL (ref 80.0–100.0)
Monocytes Absolute: 0.3 10*3/uL (ref 0.1–1.0)
Monocytes Relative: 8 %
Neutro Abs: 2.8 10*3/uL (ref 1.7–7.7)
Neutrophils Relative %: 68 %
Platelets: 174 10*3/uL (ref 150–400)
RBC: 3.3 MIL/uL — ABNORMAL LOW (ref 4.22–5.81)
RDW: 19.2 % — ABNORMAL HIGH (ref 11.5–15.5)
WBC: 4.1 10*3/uL (ref 4.0–10.5)
nRBC: 1.2 % — ABNORMAL HIGH (ref 0.0–0.2)

## 2021-10-14 LAB — SAMPLE TO BLOOD BANK

## 2021-10-14 MED ORDER — DIPHENHYDRAMINE HCL 25 MG PO CAPS
50.0000 mg | ORAL_CAPSULE | Freq: Once | ORAL | Status: AC
  Administered 2021-10-14: 50 mg via ORAL
  Filled 2021-10-14: qty 2

## 2021-10-14 MED ORDER — MONTELUKAST SODIUM 10 MG PO TABS
10.0000 mg | ORAL_TABLET | Freq: Once | ORAL | Status: AC
  Administered 2021-10-14: 10 mg via ORAL
  Filled 2021-10-14: qty 1

## 2021-10-14 MED ORDER — BORTEZOMIB CHEMO SQ INJECTION 3.5 MG (2.5MG/ML)
1.3000 mg/m2 | Freq: Once | INTRAMUSCULAR | Status: AC
  Administered 2021-10-14: 3 mg via SUBCUTANEOUS
  Filled 2021-10-14: qty 1.2

## 2021-10-14 MED ORDER — POTASSIUM CHLORIDE CRYS ER 20 MEQ PO TBCR: 20.0000 meq | EXTENDED_RELEASE_TABLET | Freq: Every day | ORAL | 0 refills | Status: DC

## 2021-10-14 MED ORDER — DARATUMUMAB-HYALURONIDASE-FIHJ 1800-30000 MG-UT/15ML ~~LOC~~ SOLN
1800.0000 mg | Freq: Once | SUBCUTANEOUS | Status: AC
  Administered 2021-10-14: 1800 mg via SUBCUTANEOUS
  Filled 2021-10-14: qty 15

## 2021-10-14 MED ORDER — DEXAMETHASONE 4 MG PO TABS
20.0000 mg | ORAL_TABLET | Freq: Once | ORAL | Status: AC
  Administered 2021-10-14: 20 mg via ORAL
  Filled 2021-10-14: qty 5

## 2021-10-14 MED ORDER — ACETAMINOPHEN 325 MG PO TABS
650.0000 mg | ORAL_TABLET | Freq: Once | ORAL | Status: AC
  Administered 2021-10-14: 650 mg via ORAL
  Filled 2021-10-14: qty 2

## 2021-10-14 NOTE — Assessment & Plan Note (Signed)
PET scan showed multiple osseous lesions.  Recommend dental clearance for bone strengthening agents- Dentist recommends wisdom teeth extraction.  Currently pain is well controlled. Discussed with Neurosurgeon, no intervention needed.  Radonc recommends no RT

## 2021-10-14 NOTE — Assessment & Plan Note (Addendum)
#  IgG lamda multiple myeloma On chemotherapy plan with lenalidomide (25 mg daily on days 1-14), subcutaneous bortezomib (1.3 mg/m2 on days 1, 4, 8, and 11), and oral dexamethasone (20 mg on days 1, 2, 8, 9, 15, and 16). SubQ daratumumab on days 1, 8, and 15 of cycles 1 through 4 and day 1 of post-ASCT consolidation cycles (cycles 5 and 6).   Labs are reviewed and discussed with patient. Proceed with D8 Dara RVD. D11 Velcade Continue  Aspirin 67m daily Refer to DRobinson #Hypoalbuminemia, follow up with nutritionist #Hyperuricemia, continue allopurinol

## 2021-10-14 NOTE — Progress Notes (Signed)
Hematology/Oncology Progress note [phone] [phone]        REFERRING PROVIDER: [name], MD    ASSESSMENT & PLAN:   Cancer Staging  Multiple myeloma not having achieved remission [facility]) Staging form: Plasma Cell Myeloma and Plasma Cell Disorders, AJCC 8th Edition - Clinical stage from 08/20/2021: RISS Stage II (Beta-2-microglobulin (mg/L): 3.6, Albumin (g/dL): 2.2, ISS: Stage II, High-risk cytogenetics: Absent, LDH: Normal) - Signed by [name], MD on 10/14/2021   Multiple myeloma not having achieved remission ([facility]) # IgG lamda multiple myeloma On chemotherapy plan with lenalidomide (25 mg daily on days 1-14), subcutaneous bortezomib (1.3 mg/m2 on days 1, 4, 8, and 11), and oral dexamethasone (20 mg on days 1, 2, 8, 9, 15, and 16). SubQ daratumumab on days 1, 8, and 15 of cycles 1 through 4 and day 1 of post-ASCT consolidation cycles (cycles 5 and 6).   Labs are reviewed and discussed with patient. Proceed with D8 Dara RVD.  Continue  Aspirin 34m daily  #Hypoalbuminemia, follow up with nutritionist #Hyperuricemia, continue allopurinol  Encounter for antineoplastic chemotherapy Chemotherapy plan as listed above.   CKD (chronic kidney disease) Avoid nephrotoxins, encourage oral hydration  Metastatic cancer to bone ([facility] PET scan showed multiple osseous lesions.  Recommend dental clearance for bone strengthening agents- Dentist recommends wisdom teeth extraction.  Currently pain is well controlled. Discussed with Neurosurgeon, no intervention needed.  Radonc recommends no RT      Recommend patient to obtain dental clearance Patient gets dexamethasone 239mon Day 1, 8, 15 along with his treatment. . Recommend him to take oral dexamethasone 20 mg, day 2, 9,16.   No orders of the defined types were placed in this encounter.   Follow up  10/13 Velcade 1 week lab Dara  2 weeks lab MD DaClearence Pedll questions were answered. The patient knows  to call the clinic with any problems, questions or concerns. No barriers to learning was detected.  ZhEarlie ServerMD 10/14/2021   CHIEF COMPLAINTS/PURPOSE OF CONSULTATION:  Multiple myeloma  HISTORY OF PRESENTING ILLNESS:  Tanner Vaeth89.o. male presents  for follow up of Multiple myeloma I have reviewed his chart and materials related to his cancer extensively and collaborated history with the patient. Summary of oncologic history is as follows: Oncology History  Multiple myeloma not having achieved remission ([facility] 08/14/2021 Imaging   CT chest abdomen pelvis without contrast Showed numerous lucencies throughout the bone structures, most pronounced throughout the spine and left left iliac bone.  Otherwise no acute findings in the chest abdomen or pelvis.  Sigmoid diverticulosis.   08/19/2021 Initial Diagnosis   Multiple myeloma not having achieved remission, stage II  -08/15/2021, bone marrow biopsy showed plasma cell myeloma involving greater than 80% of the marrow. Cytogenetics Myeloma  FISH   08/20/2021 Cancer Staging   Staging form: Plasma Cell Myeloma and Plasma Cell Disorders, AJCC 8th Edition - Clinical stage from 08/20/2021: Beta-2-microglobulin (mg/L): 3.6, Albumin (g/dL): 2.2, ISS: Stage II, High-risk cytogenetics: Unknown, LDH: Normal - Signed by YuEarlie ServerMD on 08/25/2021 Beta 2 microglobulin range (mg/L): 3.5 to 5.49 Albumin range (g/dL): Less than 3.5 Cytogenetics: Unknown   08/20/2021 -  Chemotherapy   MYELOMA NEWLY DIAGNOSED TRANSPLANT CANDIDATE DaraVRd (Daratumumab SUBQ) q21d x 6 Cycles (Induction/Consolidation)      09/11/2021 Imaging   PET scan showed  1. Extensive multifocal hypermetabolic disease throughout the axial and appendicular skeleton, with numerous lucent osseous lesions,consistent with patient's known diagnosis of multiple  myeloma. 2. No tracer avid lymph node or mass identified to suggest soft tissue plasmacytoma     INTERVAL  HISTORY Tanner [name] is a 36 y.o. male who has above history reviewed by me today presents for follow up visit for management of multiple myeloma Denies any neuropathy symptoms. Tolerates Revlimid, no diarrhea Tachycardia has improved.  No palpitation, chest pain, shortness of breath. + chronic back pain stable.  + fatigue, stable, not worse. No nausea vomiting diarrhea.  Denies numbness and tingling He was seen by dentist and was recommended to have impacted wisdom teeth extracted.   MEDICAL HISTORY:  Past Medical History:  Diagnosis Date   Alcohol use    Sciatica    Smoking     SURGICAL HISTORY: Past Surgical History:  Procedure Laterality Date   INGUINAL HERNIA REPAIR      SOCIAL HISTORY: Social History   Socioeconomic History   Marital status: Single    Spouse name: Not on file   Number of children: Not on file   Years of education: Not on file   Highest education level: Not on file  Occupational History   Not on file  Tobacco Use   Smoking status: Former    Packs/day: 0.50    Years: 10.00    Total pack years: 5.00    Types: E-cigarettes, Cigarettes    Quit date: 08/11/2021    Years since quitting: 0.1   Smokeless tobacco: Never  Vaping Use   Vaping Use: Every day  Substance and Sexual Activity   Alcohol use: Not Currently    Alcohol/week: 24.0 standard drinks of alcohol    Types: 24 Cans of beer per week    Comment: quit drinking approx 08/11/2021   Drug use: Never   Sexual activity: Not on file  Other Topics Concern   Not on file  Social History Narrative   Not on file   Social Determinants of Health   Financial Resource Strain: Not on file  Food Insecurity: Not on file  Transportation Needs: Not on file  Physical Activity: Not on file  Stress: Not on file  Social Connections: Not on file  Intimate Partner Violence: Not on file    FAMILY HISTORY: Family History  Problem Relation Age of Onset   Ovarian cancer Mother    Asthma  Mother    Alcoholism Father    Throat cancer Maternal Uncle    Heart disease Maternal Uncle    Lung cancer Maternal Uncle    Lymphoma Maternal Uncle    Lung cancer Paternal Grandmother     ALLERGIES:  has No Known Allergies.  MEDICATIONS:  Current Outpatient Medications  Medication Sig Dispense Refill   acetaminophen-codeine (TYLENOL #3) 300-30 MG tablet Take 1 tablet by mouth every 6 (six) hours as needed for moderate pain. 30 tablet 0   acyclovir (ZOVIRAX) 400 MG tablet Take 1 tablet (400 mg total) by mouth 2 (two) times daily. 60 tablet 5   allopurinol (ZYLOPRIM) 100 MG tablet Take 0.5 tablets (50 mg total) by mouth daily. 30 tablet 2   dexamethasone (DECADRON) 4 MG tablet Take 5 tablets (20 mg total) by mouth See admin instructions. take dexamethasone 20 mg, day 2, 9,16. 60 tablet 0   ondansetron (ZOFRAN) 8 MG tablet Take 1 tablet (8 mg total) by mouth 2 (two) times daily as needed for nausea or vomiting. 20 tablet 2   prochlorperazine (COMPAZINE) 10 MG tablet Take 1 tablet (10 mg total) by mouth every 6 (six)  hours as needed for nausea or vomiting. 30 tablet 2   REVLIMID 25 MG capsule TAKE 1 CAPSULE BY MOUTH ONCE DAILY FOR 14 DAYS ON AND 7 DAYS OFF 14 capsule 0   potassium chloride SA (KLOR-CON M) 20 MEQ tablet Take 1 tablet (20 mEq total) by mouth daily. 30 tablet 0   No current facility-administered medications for this visit.    Review of Systems  Constitutional:  Positive for fatigue. Negative for appetite change, chills, fever and unexpected weight change.  HENT:   Negative for hearing loss and voice change.   Eyes:  Negative for eye problems and icterus.  Respiratory:  Negative for chest tightness, cough and shortness of breath.   Cardiovascular:  Negative for chest pain and leg swelling.  Gastrointestinal:  Negative for abdominal distention and abdominal pain.  Endocrine: Negative for hot flashes.  Genitourinary:  Negative for difficulty urinating, dysuria and frequency.    Musculoskeletal:  Positive for back pain. Negative for arthralgias.  Skin:  Negative for itching and rash.  Neurological:  Negative for light-headedness and numbness.  Hematological:  Negative for adenopathy. Does not bruise/bleed easily.  Psychiatric/Behavioral:  Negative for confusion.      PHYSICAL EXAMINATION: ECOG PERFORMANCE STATUS: 1 - Symptomatic but completely ambulatory  Vitals:   10/14/21 1325  BP: 126/76  Pulse: 98  Resp: 18  SpO2: 100%   Filed Weights   10/14/21 1323  Weight: 234 lb (106.1 kg)    Physical Exam Constitutional:      General: He is not in acute distress.    Appearance: He is obese. He is not diaphoretic.  HENT:     Head: Normocephalic and atraumatic.     Nose: Nose normal.     Mouth/Throat:     Pharynx: No oropharyngeal exudate.  Eyes:     General: No scleral icterus.    Pupils: Pupils are equal, round, and reactive to light.  Cardiovascular:     Rate and Rhythm: Normal rate.     Heart sounds: No murmur heard. Pulmonary:     Effort: Pulmonary effort is normal. No respiratory distress.     Breath sounds: No rales.  Chest:     Chest wall: No tenderness.  Abdominal:     General: There is no distension.     Palpations: Abdomen is soft.     Tenderness: There is no abdominal tenderness.  Musculoskeletal:        General: Normal range of motion.     Cervical back: Normal range of motion and neck supple.  Skin:    General: Skin is warm and dry.     Findings: No erythema.  Neurological:     Mental Status: He is alert and oriented to person, place, and time.     Cranial Nerves: No cranial nerve deficit.     Motor: No abnormal muscle tone.     Coordination: Coordination normal.  Psychiatric:        Mood and Affect: Mood and affect normal.      LABORATORY DATA:  I have reviewed the data as listed    Latest Ref Rng & Units 10/14/2021    1:09 PM 10/07/2021    8:17 AM 09/29/2021    1:36 PM  CBC  WBC 4.0 - 10.5 K/uL 4.1  4.4  2.5    Hemoglobin 13.0 - 17.0 g/dL 9.4  9.6  9.2   Hematocrit 39.0 - 52.0 % 28.2  27.9  26.9   Platelets 150 - 400  K/uL 174  333  211       Latest Ref Rng & Units 10/14/2021    1:09 PM 10/07/2021    8:17 AM 09/29/2021    1:36 PM  CMP  Glucose 70 - 99 mg/dL 96  90  108   BUN 6 - 20 mg/dL 8  10  10    Creatinine 0.61 - 1.24 mg/dL 0.79  0.80  0.78   Sodium 135 - 145 mmol/L 137  134  131   Potassium 3.5 - 5.1 mmol/L 3.3  4.0  3.2   Chloride 98 - 111 mmol/L 103  105  100   CO2 22 - 32 mmol/L 30  26  29    Calcium 8.9 - 10.3 mg/dL 8.1  8.5  7.8   Total Protein 6.5 - 8.1 g/dL 6.9  7.6  8.3   Total Bilirubin 0.3 - 1.2 mg/dL 1.2  0.8  0.7   Alkaline Phos 38 - 126 U/L 220  295  200   AST 15 - 41 U/L 19  25  27    ALT 0 - 44 U/L 47  49  64        RADIOGRAPHIC STUDIES: I have personally reviewed the radiological images as listed and agreed with the findings in the report. ECHOCARDIOGRAM COMPLETE  Result Date: 09/24/2021    ECHOCARDIOGRAM REPORT   Patient Name:   JOHNCHRISTOPHER SARVIS [facility] Date of Exam: 09/24/2021 Medical Rec #:  [id number]                 Height:       72.0 in Accession #:    [id number]                Weight:       220.6 lb Date of Birth:  Jul 30, 1985                BSA:          2.221 m Patient Age:    84 years                  BP:           127/76 mmHg Patient Gender: M                         HR:           87 bpm. Exam Location:  [facility] Procedure: 2D Echo, Color Doppler and Cardiac Doppler Indications:     Tachycardia  History:         Patient has no prior history of Echocardiogram examinations.                  Risk Factors:Former Smoker.  Sonographer:     Charmayne Sheer Referring Phys:  [id number] [place] Diagnosing Phys: Serafina Royals MD IMPRESSIONS  1. Left ventricular ejection fraction, by estimation, is 55 to 60%. The left ventricle has normal function. The left ventricle has no regional wall motion abnormalities. Left ventricular diastolic parameters were normal.  2. Right ventricular  systolic function is normal. The right ventricular size is normal.  3. The mitral valve is normal in structure. Trivial mitral valve regurgitation.  4. The aortic valve is normal in structure. Aortic valve regurgitation is not visualized. FINDINGS  Left Ventricle: Left ventricular ejection fraction, by estimation, is 55 to 60%. The left ventricle has normal function. The left ventricle has no regional wall motion abnormalities. The left ventricular internal cavity  size was normal in size. There is  no left ventricular hypertrophy. Left ventricular diastolic parameters were normal. Right Ventricle: The right ventricular size is normal. No increase in right ventricular wall thickness. Right ventricular systolic function is normal. Left Atrium: Left atrial size was normal in size. Right Atrium: Right atrial size was normal in size. Pericardium: There is no evidence of pericardial effusion. Mitral Valve: The mitral valve is normal in structure. Trivial mitral valve regurgitation. Tricuspid Valve: The tricuspid valve is normal in structure. Tricuspid valve regurgitation is mild. Aortic Valve: The aortic valve is normal in structure. Aortic valve regurgitation is not visualized. Aortic valve mean gradient measures 4.0 mmHg. Aortic valve peak gradient measures 7.0 mmHg. Aortic valve area, by VTI measures 3.10 cm. Pulmonic Valve: The pulmonic valve was normal in structure. Pulmonic valve regurgitation is trivial. Aorta: The aortic root and ascending aorta are structurally normal, with no evidence of dilitation. IAS/Shunts: No atrial level shunt detected by color flow Doppler.  LEFT VENTRICLE PLAX 2D LVIDd:         4.90 cm   Diastology LVIDs:         3.20 cm   LV e' medial:    9.03 cm/s LV PW:         0.90 cm   LV E/e' medial:  6.0 LV IVS:        0.80 cm   LV e' lateral:   15.70 cm/s LVOT diam:     2.00 cm   LV E/e' lateral: 3.5 LV SV:         67 LV SV Index:   30 LVOT Area:     3.14 cm  RIGHT VENTRICLE RV Basal diam:   3.60 cm RV Mid diam:    3.00 cm TAPSE (M-mode): 2.5 cm LEFT ATRIUM             Index        RIGHT ATRIUM           Index LA diam:        4.00 cm 1.80 cm/m   RA Area:     13.80 cm LA Vol (A2C):   38.1 ml 17.15 ml/m  RA Volume:   29.20 ml  13.15 ml/m LA Vol (A4C):   37.1 ml 16.70 ml/m LA Biplane Vol: 37.7 ml 16.97 ml/m  AORTIC VALVE AV Area (Vmax):    2.74 cm AV Area (Vmean):   2.74 cm AV Area (VTI):     3.10 cm AV Vmax:           132.00 cm/s AV Vmean:          88.200 cm/s AV VTI:            0.216 m AV Peak Grad:      7.0 mmHg AV Mean Grad:      4.0 mmHg LVOT Vmax:         115.00 cm/s LVOT Vmean:        77.000 cm/s LVOT VTI:          0.213 m LVOT/AV VTI ratio: 0.99  AORTA Ao Root diam: 3.60 cm MITRAL VALVE               TRICUSPID VALVE MV Area (PHT): 4.06 cm    TR Peak grad:   18.0 mmHg MV Decel Time: 187 msec    TR Vmax:        212.00 cm/s MV E velocity: 54.50 cm/s MV A velocity: 79.10 cm/s  SHUNTS MV E/A ratio:  0.69        Systemic VTI:  0.21 m                            Systemic Diam: 2.00 cm Serafina Royals MD Electronically signed by Serafina Royals MD Signature Date/Time: 09/24/2021/12:12:34 PM    Final

## 2021-10-14 NOTE — Assessment & Plan Note (Signed)
Avoid nephrotoxins, encourage oral hydration 

## 2021-10-14 NOTE — Assessment & Plan Note (Signed)
Chemotherapy plan as listed above 

## 2021-10-17 ENCOUNTER — Inpatient Hospital Stay: Payer: Commercial Managed Care - PPO

## 2021-10-17 VITALS — BP 144/86 | HR 106 | Temp 97.9°F | Resp 18

## 2021-10-17 DIAGNOSIS — Z5112 Encounter for antineoplastic immunotherapy: Secondary | ICD-10-CM | POA: Diagnosis not present

## 2021-10-17 DIAGNOSIS — C9000 Multiple myeloma not having achieved remission: Secondary | ICD-10-CM

## 2021-10-17 MED ORDER — PROCHLORPERAZINE MALEATE 10 MG PO TABS
10.0000 mg | ORAL_TABLET | Freq: Once | ORAL | Status: AC
  Administered 2021-10-17: 10 mg via ORAL
  Filled 2021-10-17: qty 1

## 2021-10-17 MED ORDER — BORTEZOMIB CHEMO SQ INJECTION 3.5 MG (2.5MG/ML)
1.3000 mg/m2 | Freq: Once | INTRAMUSCULAR | Status: AC
  Administered 2021-10-17: 3 mg via SUBCUTANEOUS
  Filled 2021-10-17: qty 1.2

## 2021-10-17 NOTE — Patient Instructions (Signed)
[facility] CANCER CTR AT [place]  Discharge Instructions: Thank you for choosing [facility] to provide your oncology and hematology care.  If you have a lab appointment with the [facility], please go directly to the [facility] and check in at the registration area.  Wear comfortable clothing and clothing appropriate for easy access to any Portacath or PICC line.   We strive to give you quality time with your provider. You may need to reschedule your appointment if you arrive late (15 or more minutes).  Arriving late affects you and other patients whose appointments are after yours.  Also, if you miss three or more appointments without notifying the office, you may be dismissed from the clinic at the provider's discretion.      For prescription refill requests, have your pharmacy contact our office and allow 72 hours for refills to be completed.    Today you received the following chemotherapy and/or immunotherapy agents VELCADE      To help prevent nausea and vomiting after your treatment, we encourage you to take your nausea medication as directed.  BELOW ARE SYMPTOMS THAT SHOULD BE REPORTED IMMEDIATELY: *FEVER GREATER THAN 100.4 F (38 C) OR HIGHER *CHILLS OR SWEATING *NAUSEA AND VOMITING THAT IS NOT CONTROLLED WITH YOUR NAUSEA MEDICATION *UNUSUAL SHORTNESS OF BREATH *UNUSUAL BRUISING OR BLEEDING *URINARY PROBLEMS (pain or burning when urinating, or frequent urination) *BOWEL PROBLEMS (unusual diarrhea, constipation, pain near the anus) TENDERNESS IN MOUTH AND THROAT WITH OR WITHOUT PRESENCE OF ULCERS (sore throat, sores in mouth, or a toothache) UNUSUAL RASH, SWELLING OR PAIN  UNUSUAL VAGINAL DISCHARGE OR ITCHING   Items with * indicate a potential emergency and should be followed up as soon as possible or go to the Emergency Department if any problems should occur.  Please show the CHEMOTHERAPY ALERT CARD or IMMUNOTHERAPY ALERT CARD at check-in to the  Emergency Department and triage nurse.  Should you have questions after your visit or need to cancel or reschedule your appointment, please contact [facility] CANCER [place] AT [place]  [phone] and follow the prompts.  Office hours are 8:00 a.m. to 4:30 p.m. Monday - Friday. Please note that voicemails left after 4:00 p.m. may not be returned until the following business day.  We are closed weekends and major holidays. You have access to a nurse at all times for urgent questions. Please call the main number to the clinic [phone] and follow the prompts.  Bortezomib Injection What is this medication? BORTEZOMIB (bor TEZ oh mib) treats lymphoma. It may also be used to treat multiple myeloma, a type of bone marrow cancer. It works by blocking a protein that causes cancer cells to grow and multiply. This helps to slow or stop the spread of cancer cells. This medicine may be used for other purposes; ask your health care provider or pharmacist if you have questions. COMMON BRAND NAME(S): Velcade What should I tell my care team before I take this medication? They need to know if you have any of these conditions: Dehydration Diabetes Heart disease Liver disease Tingling of the fingers or toes or other nerve disorder An unusual or allergic reaction to bortezomib, other medications, foods, dyes, or preservatives If you or your partner are pregnant or trying to get pregnant Breastfeeding How should I use this medication? This medication is injected into a vein or under the skin. It is given by your care team in a hospital or clinic setting. Talk to your care team about the  use of this medication in children. Special care may be needed. Overdosage: If you think you have taken too much of this medicine contact a poison control center or emergency room at once. NOTE: This medicine is only for you. Do not share this medicine with others. What if I miss a dose? Keep appointments for  follow-up doses. It is important not to miss your dose. Call your care team if you are unable to keep an appointment. What may interact with this medication? Ketoconazole Rifampin This list may not describe all possible interactions. Give your health care provider a list of all the medicines, herbs, non-prescription drugs, or dietary supplements you use. Also tell them if you smoke, drink alcohol, or use illegal drugs. Some items may interact with your medicine. What should I watch for while using this medication? Your condition will be monitored carefully while you are receiving this medication. You may need blood work while taking this medication. This medication may affect your coordination, reaction time, or judgment. Do not drive or operate machinery until you know how this medication affects you. Sit up or stand slowly to reduce the risk of dizzy or fainting spells. Drinking alcohol with this medication can increase the risk of these side effects. This medication may increase your risk of getting an infection. Call your care team for advice if you get a fever, chills, sore throat, or other symptoms of a cold or flu. Do not treat yourself. Try to avoid being around people who are sick. Check with your care team if you have severe diarrhea, nausea, and vomiting, or if you sweat a lot. The loss of too much body fluid may make it dangerous for you to take this medication. Talk to your care team if you may be pregnant. Serious birth defects can occur if you take this medication during pregnancy and for 7 months after the last dose. You will need a negative pregnancy test before starting this medication. Contraception is recommended while taking this medication and for 7 months after the last dose. Your care team can help you find the option that works for you. If your partner can get pregnant, use a condom during sex while taking this medication and for 4 months after the last dose. Do not breastfeed  while taking this medication and for 2 months after the last dose. This medication may cause infertility. Talk to your care team if you are concerned about your fertility. What side effects may I notice from receiving this medication? Side effects that you should report to your care team as soon as possible: Allergic reactions--skin rash, itching, hives, swelling of the face, lips, tongue, or throat Bleeding--bloody or black, tar-like stools, vomiting blood or brown material that looks like coffee grounds, red or dark brown urine, small red or purple spots on skin, unusual bruising or bleeding Bleeding in the brain--severe headache, stiff neck, confusion, dizziness, change in vision, numbness or weakness of the face, arm, or leg, trouble speaking, trouble walking, vomiting Bowel blockage--stomach cramping, unable to have a bowel movement or pass gas, loss of appetite, vomiting Heart failure--shortness of breath, swelling of the ankles, feet, or hands, sudden weight gain, unusual weakness or fatigue Infection--fever, chills, cough, sore throat, wounds that don't heal, pain or trouble when passing urine, general feeling of discomfort or being unwell Liver injury--right upper belly pain, loss of appetite, nausea, light-colored stool, dark yellow or brown urine, yellowing skin or eyes, unusual weakness or fatigue Low blood pressure--dizziness, feeling faint or  lightheaded, blurry vision Lung injury--shortness of breath or trouble breathing, cough, spitting up blood, chest pain, fever Pain, tingling, or numbness in the hands or feet Severe or prolonged diarrhea Stomach pain, bloody diarrhea, pale skin, unusual weakness or fatigue, decrease in the amount of urine, which may be signs of hemolytic uremic syndrome Sudden and severe headache, confusion, change in vision, seizures, which may be signs of posterior reversible encephalopathy syndrome (PRES) TTP--purple spots on the skin or inside the mouth, pale  skin, yellowing skin or eyes, unusual weakness or fatigue, fever, fast or irregular heartbeat, confusion, change in vision, trouble speaking, trouble walking Tumor lysis syndrome (TLS)--nausea, vomiting, diarrhea, decrease in the amount of urine, dark urine, unusual weakness or fatigue, confusion, muscle pain or cramps, fast or irregular heartbeat, joint pain Side effects that usually do not require medical attention (report to your care team if they continue or are bothersome): Constipation Diarrhea Fatigue Loss of appetite Nausea This list may not describe all possible side effects. Call your doctor for medical advice about side effects. You may report side effects to FDA at 1-800-FDA-1088. Where should I keep my medication? This medication is given in a hospital or clinic. It will not be stored at home. NOTE: This sheet is a summary. It may not cover all possible information. If you have questions about this medicine, talk to your doctor, pharmacist, or health care provider.  2023 Elsevier/Gold Standard (2021-05-21 00:00:00)   For any non-urgent questions, you may also contact your provider using MyChart. We now offer e-Visits for anyone 44 and older to request care online for non-urgent symptoms. For details visit [email or web address].   Also download the MyChart app! Go to the app store, search "MyChart", open the app, select , and log in with your MyChart username and password.  Masks are optional in the cancer centers. If you would like for your care team to wear a mask while they are taking care of you, please let them know. For doctor visits, patients may have with them one support person who is at least 35 years old. At this time, visitors are not allowed in the infusion area.

## 2021-10-21 ENCOUNTER — Ambulatory Visit: Payer: Commercial Managed Care - PPO | Admitting: Oncology

## 2021-10-21 ENCOUNTER — Inpatient Hospital Stay: Payer: Commercial Managed Care - PPO

## 2021-10-21 VITALS — BP 123/85 | HR 93 | Temp 97.8°F | Resp 18

## 2021-10-21 DIAGNOSIS — C9000 Multiple myeloma not having achieved remission: Secondary | ICD-10-CM

## 2021-10-21 DIAGNOSIS — Z5112 Encounter for antineoplastic immunotherapy: Secondary | ICD-10-CM | POA: Diagnosis not present

## 2021-10-21 LAB — CBC WITH DIFFERENTIAL/PLATELET
Abs Immature Granulocytes: 0.02 10*3/uL (ref 0.00–0.07)
Basophils Absolute: 0 10*3/uL (ref 0.0–0.1)
Basophils Relative: 0 %
Eosinophils Absolute: 0.1 10*3/uL (ref 0.0–0.5)
Eosinophils Relative: 2 %
HCT: 27.7 % — ABNORMAL LOW (ref 39.0–52.0)
Hemoglobin: 9.5 g/dL — ABNORMAL LOW (ref 13.0–17.0)
Immature Granulocytes: 0 %
Lymphocytes Relative: 18 %
Lymphs Abs: 1 10*3/uL (ref 0.7–4.0)
MCH: 28.9 pg (ref 26.0–34.0)
MCHC: 34.3 g/dL (ref 30.0–36.0)
MCV: 84.2 fL (ref 80.0–100.0)
Monocytes Absolute: 0.8 10*3/uL (ref 0.1–1.0)
Monocytes Relative: 15 %
Neutro Abs: 3.5 10*3/uL (ref 1.7–7.7)
Neutrophils Relative %: 65 %
Platelets: 102 10*3/uL — ABNORMAL LOW (ref 150–400)
RBC: 3.29 MIL/uL — ABNORMAL LOW (ref 4.22–5.81)
RDW: 17.8 % — ABNORMAL HIGH (ref 11.5–15.5)
WBC: 5.5 10*3/uL (ref 4.0–10.5)
nRBC: 1.1 % — ABNORMAL HIGH (ref 0.0–0.2)

## 2021-10-21 LAB — COMPREHENSIVE METABOLIC PANEL
ALT: 17 U/L (ref 0–44)
AST: 24 U/L (ref 15–41)
Albumin: 3.1 g/dL — ABNORMAL LOW (ref 3.5–5.0)
Alkaline Phosphatase: 132 U/L — ABNORMAL HIGH (ref 38–126)
Anion gap: 8 (ref 5–15)
BUN: 9 mg/dL (ref 6–20)
CO2: 24 mmol/L (ref 22–32)
Calcium: 8.3 mg/dL — ABNORMAL LOW (ref 8.9–10.3)
Chloride: 105 mmol/L (ref 98–111)
Creatinine, Ser: 0.83 mg/dL (ref 0.61–1.24)
GFR, Estimated: >60 mL/min (ref >60)
Glucose, Bld: 123 mg/dL — ABNORMAL HIGH (ref 70–99)
Potassium: 2.8 mmol/L — ABNORMAL LOW (ref 3.5–5.1)
Sodium: 137 mmol/L (ref 135–145)
Total Bilirubin: 1.2 mg/dL (ref 0.3–1.2)
Total Protein: 6.6 g/dL (ref 6.5–8.1)

## 2021-10-21 LAB — SAMPLE TO BLOOD BANK

## 2021-10-21 MED ORDER — SODIUM CHLORIDE 0.9 % IV SOLN
20.0000 meq | Freq: Once | INTRAVENOUS | Status: AC
  Administered 2021-10-21: 20 meq via INTRAVENOUS
  Filled 2021-10-21: qty 10

## 2021-10-21 MED ORDER — DEXAMETHASONE 4 MG PO TABS
20.0000 mg | ORAL_TABLET | Freq: Once | ORAL | Status: AC
  Administered 2021-10-21: 20 mg via ORAL
  Filled 2021-10-21: qty 5

## 2021-10-21 MED ORDER — DIPHENHYDRAMINE HCL 25 MG PO CAPS
50.0000 mg | ORAL_CAPSULE | Freq: Once | ORAL | Status: AC
  Administered 2021-10-21: 50 mg via ORAL
  Filled 2021-10-21: qty 2

## 2021-10-21 MED ORDER — ACETAMINOPHEN 325 MG PO TABS
650.0000 mg | ORAL_TABLET | Freq: Once | ORAL | Status: AC
  Administered 2021-10-21: 650 mg via ORAL
  Filled 2021-10-21: qty 2

## 2021-10-21 MED ORDER — DARATUMUMAB-HYALURONIDASE-FIHJ 1800-30000 MG-UT/15ML ~~LOC~~ SOLN
1800.0000 mg | Freq: Once | SUBCUTANEOUS | Status: AC
  Administered 2021-10-21: 1800 mg via SUBCUTANEOUS
  Filled 2021-10-21: qty 15

## 2021-10-21 MED ORDER — SODIUM CHLORIDE 0.9 % IV SOLN
Freq: Once | INTRAVENOUS | Status: AC
  Filled 2021-10-21: qty 250

## 2021-10-21 MED ORDER — MONTELUKAST SODIUM 10 MG PO TABS
10.0000 mg | ORAL_TABLET | Freq: Once | ORAL | Status: AC
  Administered 2021-10-21: 10 mg via ORAL
  Filled 2021-10-21: qty 1

## 2021-10-21 MED ORDER — POTASSIUM CHLORIDE 20 MEQ/100ML IV SOLN: 20.0000 meq | Freq: Once | INTRAVENOUS | Status: DC

## 2021-10-21 NOTE — Patient Instructions (Addendum)
[facility] CANCER CTR AT [place]  Discharge Instructions: Thank you for choosing [facility] to provide your oncology and hematology care.  If you have a lab appointment with the [facility], please go directly to the [facility] and check in at the registration area.  Wear comfortable clothing and clothing appropriate for easy access to any Portacath or PICC line.   We strive to give you quality time with your provider. You may need to reschedule your appointment if you arrive late (15 or more minutes).  Arriving late affects you and other patients whose appointments are after yours.  Also, if you miss three or more appointments without notifying the office, you may be dismissed from the clinic at the provider's discretion.      For prescription refill requests, have your pharmacy contact our office and allow 72 hours for refills to be completed.    Today you received the following chemotherapy and/or immunotherapy agents Daratumumab and Potassium chloride.      To help prevent nausea and vomiting after your treatment, we encourage you to take your nausea medication as directed.  BELOW ARE SYMPTOMS THAT SHOULD BE REPORTED IMMEDIATELY: *FEVER GREATER THAN 100.4 F (38 C) OR HIGHER *CHILLS OR SWEATING *NAUSEA AND VOMITING THAT IS NOT CONTROLLED WITH YOUR NAUSEA MEDICATION *UNUSUAL SHORTNESS OF BREATH *UNUSUAL BRUISING OR BLEEDING *URINARY PROBLEMS (pain or burning when urinating, or frequent urination) *BOWEL PROBLEMS (unusual diarrhea, constipation, pain near the anus) TENDERNESS IN MOUTH AND THROAT WITH OR WITHOUT PRESENCE OF ULCERS (sore throat, sores in mouth, or a toothache) UNUSUAL RASH, SWELLING OR PAIN  UNUSUAL VAGINAL DISCHARGE OR ITCHING   Items with * indicate a potential emergency and should be followed up as soon as possible or go to the Emergency Department if any problems should occur.  Please show the CHEMOTHERAPY ALERT CARD or IMMUNOTHERAPY  ALERT CARD at check-in to the Emergency Department and triage nurse.  Should you have questions after your visit or need to cancel or reschedule your appointment, please contact [facility] CANCER [place] AT [place]  [phone] and follow the prompts.  Office hours are 8:00 a.m. to 4:30 p.m. Monday - Friday. Please note that voicemails left after 4:00 p.m. may not be returned until the following business day.  We are closed weekends and major holidays. You have access to a nurse at all times for urgent questions. Please call the main number to the clinic [phone] and follow the prompts.  For any non-urgent questions, you may also contact your provider using MyChart. We now offer e-Visits for anyone 63 and older to request care online for non-urgent symptoms. For details visit [email or web address].   Also download the MyChart app! Go to the app store, search "MyChart", open the app, select [facility], and log in with your MyChart username and password.  Masks are optional in the cancer centers. If you would like for your care team to wear a mask while they are taking care of you, please let them know. For doctor visits, patients may have with them one support person who is at least 35 years old. At this time, visitors are not allowed in the infusion area.

## 2021-10-22 ENCOUNTER — Ambulatory Visit: Payer: Commercial Managed Care - PPO

## 2021-10-24 ENCOUNTER — Telehealth: Payer: Self-pay

## 2021-10-24 ENCOUNTER — Inpatient Hospital Stay: Payer: Commercial Managed Care - PPO

## 2021-10-24 ENCOUNTER — Other Ambulatory Visit: Payer: Self-pay | Admitting: Oncology

## 2021-10-24 ENCOUNTER — Telehealth: Payer: Self-pay | Admitting: Oncology

## 2021-10-24 NOTE — Telephone Encounter (Signed)
Appts were modified and appt on 10/20 was not cancelled. Magda Paganini, RN will call to inform pt, per [facility] message.

## 2021-10-24 NOTE — Telephone Encounter (Signed)
Called pt as requested per [facility] ,Patient has been notified of cancelled appt for today. He is curious as to why he will no longer need INF. Requested for you to give him a call.

## 2021-10-27 ENCOUNTER — Other Ambulatory Visit: Payer: Commercial Managed Care - PPO

## 2021-10-28 ENCOUNTER — Inpatient Hospital Stay (HOSPITAL_BASED_OUTPATIENT_CLINIC_OR_DEPARTMENT_OTHER): Payer: Commercial Managed Care - PPO | Admitting: Oncology

## 2021-10-28 ENCOUNTER — Inpatient Hospital Stay: Payer: Commercial Managed Care - PPO

## 2021-10-28 ENCOUNTER — Ambulatory Visit: Payer: Commercial Managed Care - PPO

## 2021-10-28 ENCOUNTER — Encounter: Payer: Self-pay | Admitting: Oncology

## 2021-10-28 VITALS — BP 114/65 | HR 90 | Wt 235.4 lb

## 2021-10-28 DIAGNOSIS — C9000 Multiple myeloma not having achieved remission: Secondary | ICD-10-CM | POA: Diagnosis not present

## 2021-10-28 DIAGNOSIS — N189 Chronic kidney disease, unspecified: Secondary | ICD-10-CM | POA: Diagnosis not present

## 2021-10-28 DIAGNOSIS — Z5111 Encounter for antineoplastic chemotherapy: Secondary | ICD-10-CM

## 2021-10-28 DIAGNOSIS — C7951 Secondary malignant neoplasm of bone: Secondary | ICD-10-CM | POA: Diagnosis not present

## 2021-10-28 DIAGNOSIS — Z5112 Encounter for antineoplastic immunotherapy: Secondary | ICD-10-CM | POA: Diagnosis not present

## 2021-10-28 LAB — CBC WITH DIFFERENTIAL/PLATELET
Abs Immature Granulocytes: 0.01 10*3/uL (ref 0.00–0.07)
Basophils Absolute: 0 10*3/uL (ref 0.0–0.1)
Basophils Relative: 1 %
Eosinophils Absolute: 0.2 10*3/uL (ref 0.0–0.5)
Eosinophils Relative: 4 %
HCT: 31 % — ABNORMAL LOW (ref 39.0–52.0)
Hemoglobin: 10 g/dL — ABNORMAL LOW (ref 13.0–17.0)
Immature Granulocytes: 0 %
Lymphocytes Relative: 27 %
Lymphs Abs: 1.2 10*3/uL (ref 0.7–4.0)
MCH: 29.1 pg (ref 26.0–34.0)
MCHC: 32.3 g/dL (ref 30.0–36.0)
MCV: 90.1 fL (ref 80.0–100.0)
Monocytes Absolute: 0.5 10*3/uL (ref 0.1–1.0)
Monocytes Relative: 11 %
Neutro Abs: 2.5 10*3/uL (ref 1.7–7.7)
Neutrophils Relative %: 57 %
Platelets: 370 10*3/uL (ref 150–400)
RBC: 3.44 MIL/uL — ABNORMAL LOW (ref 4.22–5.81)
RDW: 18.3 % — ABNORMAL HIGH (ref 11.5–15.5)
WBC: 4.4 10*3/uL (ref 4.0–10.5)
nRBC: 0 % (ref 0.0–0.2)

## 2021-10-28 LAB — COMPREHENSIVE METABOLIC PANEL
ALT: 14 U/L (ref 0–44)
AST: 15 U/L (ref 15–41)
Albumin: 3.6 g/dL (ref 3.5–5.0)
Alkaline Phosphatase: 110 U/L (ref 38–126)
Anion gap: 5 (ref 5–15)
BUN: 9 mg/dL (ref 6–20)
CO2: 27 mmol/L (ref 22–32)
Calcium: 8.8 mg/dL — ABNORMAL LOW (ref 8.9–10.3)
Chloride: 106 mmol/L (ref 98–111)
Creatinine, Ser: 0.81 mg/dL (ref 0.61–1.24)
GFR, Estimated: >60 mL/min (ref >60)
Glucose, Bld: 90 mg/dL (ref 70–99)
Potassium: 4.4 mmol/L (ref 3.5–5.1)
Sodium: 138 mmol/L (ref 135–145)
Total Bilirubin: 1 mg/dL (ref 0.3–1.2)
Total Protein: 7 g/dL (ref 6.5–8.1)

## 2021-10-28 MED ORDER — DEXAMETHASONE 4 MG PO TABS
20.0000 mg | ORAL_TABLET | Freq: Once | ORAL | Status: AC
  Administered 2021-10-28: 20 mg via ORAL
  Filled 2021-10-28: qty 5

## 2021-10-28 MED ORDER — DIPHENHYDRAMINE HCL 25 MG PO CAPS
50.0000 mg | ORAL_CAPSULE | Freq: Once | ORAL | Status: AC
  Administered 2021-10-28: 50 mg via ORAL
  Filled 2021-10-28: qty 2

## 2021-10-28 MED ORDER — MONTELUKAST SODIUM 10 MG PO TABS
10.0000 mg | ORAL_TABLET | Freq: Once | ORAL | Status: AC
  Administered 2021-10-28: 10 mg via ORAL
  Filled 2021-10-28: qty 1

## 2021-10-28 MED ORDER — BORTEZOMIB CHEMO SQ INJECTION 3.5 MG (2.5MG/ML)
1.3000 mg/m2 | Freq: Once | INTRAMUSCULAR | Status: AC
  Administered 2021-10-28: 3 mg via SUBCUTANEOUS
  Filled 2021-10-28: qty 1.2

## 2021-10-28 MED ORDER — ACETAMINOPHEN 325 MG PO TABS
650.0000 mg | ORAL_TABLET | Freq: Once | ORAL | Status: AC
  Administered 2021-10-28: 650 mg via ORAL
  Filled 2021-10-28: qty 2

## 2021-10-28 MED ORDER — DARATUMUMAB-HYALURONIDASE-FIHJ 1800-30000 MG-UT/15ML ~~LOC~~ SOLN
1800.0000 mg | Freq: Once | SUBCUTANEOUS | Status: AC
  Administered 2021-10-28: 1800 mg via SUBCUTANEOUS
  Filled 2021-10-28: qty 15

## 2021-10-28 NOTE — Assessment & Plan Note (Signed)
PET scan showed multiple osseous lesions.  Awaiting dental clearance for bone strengthening agents- Dentist recommends wisdom teeth extraction.  Currently pain is well controlled. Discussed with Neurosurgeon, no intervention needed.  Radonc recommends no RT for now

## 2021-10-28 NOTE — Patient Instructions (Signed)
[facility] [place]  Discharge Instructions: Thank you for choosing [facility] to provide your oncology and hematology care.  If you have a lab appointment with the [facility], please go directly to the [facility] and check in at the registration area.  Wear comfortable clothing and clothing appropriate for easy access to any Portacath or PICC line.   We strive to give you quality time with your provider. You may need to reschedule your appointment if you arrive late (15 or more minutes).  Arriving late affects you and other patients whose appointments are after yours.  Also, if you miss three or more appointments without notifying the office, you may be dismissed from the clinic at the provider's discretion.      For prescription refill requests, have your pharmacy contact our office and allow 72 hours for refills to be completed.    Today you received the following chemotherapy and/or immunotherapy agents DARZALEX and VELCADE      To help prevent nausea and vomiting after your treatment, we encourage you to take your nausea medication as directed.  BELOW ARE SYMPTOMS THAT SHOULD BE REPORTED IMMEDIATELY: *FEVER GREATER THAN 100.4 F (38 C) OR HIGHER *CHILLS OR SWEATING *NAUSEA AND VOMITING THAT IS NOT CONTROLLED WITH YOUR NAUSEA MEDICATION *UNUSUAL SHORTNESS OF BREATH *UNUSUAL BRUISING OR BLEEDING *URINARY PROBLEMS (pain or burning when urinating, or frequent urination) *BOWEL PROBLEMS (unusual diarrhea, constipation, pain near the anus) TENDERNESS IN MOUTH AND THROAT WITH OR WITHOUT PRESENCE OF ULCERS (sore throat, sores in mouth, or a toothache) UNUSUAL RASH, SWELLING OR PAIN  UNUSUAL VAGINAL DISCHARGE OR ITCHING   Items with * indicate a potential emergency and should be followed up as soon as possible or go to the Emergency Department if any problems should occur.  Please show the CHEMOTHERAPY ALERT CARD or IMMUNOTHERAPY ALERT CARD at  check-in to the Emergency Department and triage nurse.  Should you have questions after your visit or need to cancel or reschedule your appointment, please contact [facility] [place] [facility] [place]  [phone] and follow the prompts.  Office hours are 8:00 a.m. to 4:30 p.m. Monday - Friday. Please note that voicemails left after 4:00 p.m. may not be returned until the following business day.  We are closed weekends and major holidays. You have access to a nurse at all times for urgent questions. Please call the main number to the clinic [phone] and follow the prompts.  For any non-urgent questions, you may also contact your provider using MyChart. We now offer e-Visits for anyone 17 and older to request care online for non-urgent symptoms. For details visit [email or web address].   Also download the MyChart app! Go to the app store, search "MyChart", open the app, select [facility], and log in with your MyChart username and password.  Masks are optional in the cancer centers. If you would like for your care team to wear a mask while they are taking care of you, please let them know. For doctor visits, patients may have with them one support person who is at least 35 years old. At this time, visitors are not allowed in the infusion area.  Daratumumab Injection What is this medication? DARATUMUMAB (dar a toom ue mab) treats multiple myeloma, a type of bone marrow cancer. It works by helping your immune system slow or stop the spread of cancer cells. It is a monoclonal antibody. This medicine may be used for other purposes; ask your health care provider  or pharmacist if you have questions. COMMON BRAND NAME(S): DARZALEX What should I tell my care team before I take this medication? They need to know if you have any of these conditions: Hereditary fructose intolerance Infection, such as chickenpox, herpes, hepatitis B virus Lung or breathing disease, such as asthma, COPD An  unusual or allergic reaction to daratumumab, sorbitol, other medications, foods, dyes, or preservatives Pregnant or trying to get pregnant Breast-feeding How should I use this medication? This medication is injected into a vein. It is given by your care team in a hospital or clinic setting. Talk to your care team about the use of this medication in children. Special care may be needed. Overdosage: If you think you have taken too much of this medicine contact a poison control center or emergency room at once. NOTE: This medicine is only for you. Do not share this medicine with others. What if I miss a dose? Keep appointments for follow-up doses. It is important not to miss your dose. Call your care team if you are unable to keep an appointment. What may interact with this medication? Interactions have not been studied. This list may not describe all possible interactions. Give your health care provider a list of all the medicines, herbs, non-prescription drugs, or dietary supplements you use. Also tell them if you smoke, drink alcohol, or use illegal drugs. Some items may interact with your medicine. What should I watch for while using this medication? Your condition will be monitored carefully while you are receiving this medication. This medication can cause serious allergic reactions. To reduce your risk, your care team may give you other medication to take before receiving this one. Be sure to follow the directions from your care team. This medication can affect the results of blood tests to match your blood type. These changes can last for up to 6 months after the final dose. Your care team will do blood tests to match your blood type before you start treatment. Tell all of your care team that you are being treated with this medication before receiving a blood transfusion. This medication can affect the results of some tests used to determine treatment response; extra tests may be needed to  evaluate response. Talk to your care team if you wish to become pregnant or think you are pregnant. This medication can cause serious birth defects if taken during pregnancy and for 3 months after the last dose. A reliable form of contraception is recommended while taking this medication and for 3 months after the last dose. Talk to your care team about effective forms of contraception. Do not breast-feed while taking this medication. What side effects may I notice from receiving this medication? Side effects that you should report to your care team as soon as possible: Allergic reactions--skin rash, itching, hives, swelling of the face, lips, tongue, or throat Infection--fever, chills, cough, sore throat, wounds that don't heal, pain or trouble when passing urine, general feeling of discomfort or being unwell Infusion reactions--chest pain, shortness of breath or trouble breathing, feeling faint or lightheaded Unusual bruising or bleeding Side effects that usually do not require medical attention (report to your care team if they continue or are bothersome): Constipation Diarrhea Fatigue Nausea Pain, tingling, or numbness in the hands or feet Swelling of the ankles, hands, or feet This list may not describe all possible side effects. Call your doctor for medical advice about side effects. You may report side effects to FDA at 1-800-FDA-1088. Where should I  keep my medication? This medication is given in a hospital or clinic. It will not be stored at home. NOTE: This sheet is a summary. It may not cover all possible information. If you have questions about this medicine, talk to your doctor, pharmacist, or health care provider.  2023 Elsevier/Gold Standard (2013-11-20 00:00:00)  Bortezomib Injection What is this medication? BORTEZOMIB (bor TEZ oh mib) treats lymphoma. It may also be used to treat multiple myeloma, a type of bone marrow cancer. It works by blocking a protein that causes  cancer cells to grow and multiply. This helps to slow or stop the spread of cancer cells. This medicine may be used for other purposes; ask your health care provider or pharmacist if you have questions. COMMON BRAND NAME(S): Velcade What should I tell my care team before I take this medication? They need to know if you have any of these conditions: Dehydration Diabetes Heart disease Liver disease Tingling of the fingers or toes or other nerve disorder An unusual or allergic reaction to bortezomib, other medications, foods, dyes, or preservatives If you or your partner are pregnant or trying to get pregnant Breastfeeding How should I use this medication? This medication is injected into a vein or under the skin. It is given by your care team in a hospital or clinic setting. Talk to your care team about the use of this medication in children. Special care may be needed. Overdosage: If you think you have taken too much of this medicine contact a poison control center or emergency room at once. NOTE: This medicine is only for you. Do not share this medicine with others. What if I miss a dose? Keep appointments for follow-up doses. It is important not to miss your dose. Call your care team if you are unable to keep an appointment. What may interact with this medication? Ketoconazole Rifampin This list may not describe all possible interactions. Give your health care provider a list of all the medicines, herbs, non-prescription drugs, or dietary supplements you use. Also tell them if you smoke, drink alcohol, or use illegal drugs. Some items may interact with your medicine. What should I watch for while using this medication? Your condition will be monitored carefully while you are receiving this medication. You may need blood work while taking this medication. This medication may affect your coordination, reaction time, or judgment. Do not drive or operate machinery until you know how this  medication affects you. Sit up or stand slowly to reduce the risk of dizzy or fainting spells. Drinking alcohol with this medication can increase the risk of these side effects. This medication may increase your risk of getting an infection. Call your care team for advice if you get a fever, chills, sore throat, or other symptoms of a cold or flu. Do not treat yourself. Try to avoid being around people who are sick. Check with your care team if you have severe diarrhea, nausea, and vomiting, or if you sweat a lot. The loss of too much body fluid may make it dangerous for you to take this medication. Talk to your care team if you may be pregnant. Serious birth defects can occur if you take this medication during pregnancy and for 7 months after the last dose. You will need a negative pregnancy test before starting this medication. Contraception is recommended while taking this medication and for 7 months after the last dose. Your care team can help you find the option that works for you. If your  partner can get pregnant, use a condom during sex while taking this medication and for 4 months after the last dose. Do not breastfeed while taking this medication and for 2 months after the last dose. This medication may cause infertility. Talk to your care team if you are concerned about your fertility. What side effects may I notice from receiving this medication? Side effects that you should report to your care team as soon as possible: Allergic reactions--skin rash, itching, hives, swelling of the face, lips, tongue, or throat Bleeding--bloody or black, tar-like stools, vomiting blood or brown material that looks like coffee grounds, red or dark brown urine, small red or purple spots on skin, unusual bruising or bleeding Bleeding in the brain--severe headache, stiff neck, confusion, dizziness, change in vision, numbness or weakness of the face, arm, or leg, trouble speaking, trouble walking, vomiting Bowel  blockage--stomach cramping, unable to have a bowel movement or pass gas, loss of appetite, vomiting Heart failure--shortness of breath, swelling of the ankles, feet, or hands, sudden weight gain, unusual weakness or fatigue Infection--fever, chills, cough, sore throat, wounds that don't heal, pain or trouble when passing urine, general feeling of discomfort or being unwell Liver injury--right upper belly pain, loss of appetite, nausea, light-colored stool, dark yellow or brown urine, yellowing skin or eyes, unusual weakness or fatigue Low blood pressure--dizziness, feeling faint or lightheaded, blurry vision Lung injury--shortness of breath or trouble breathing, cough, spitting up blood, chest pain, fever Pain, tingling, or numbness in the hands or feet Severe or prolonged diarrhea Stomach pain, bloody diarrhea, pale skin, unusual weakness or fatigue, decrease in the amount of urine, which may be signs of hemolytic uremic syndrome Sudden and severe headache, confusion, change in vision, seizures, which may be signs of posterior reversible encephalopathy syndrome (PRES) TTP--purple spots on the skin or inside the mouth, pale skin, yellowing skin or eyes, unusual weakness or fatigue, fever, fast or irregular heartbeat, confusion, change in vision, trouble speaking, trouble walking Tumor lysis syndrome (TLS)--nausea, vomiting, diarrhea, decrease in the amount of urine, dark urine, unusual weakness or fatigue, confusion, muscle pain or cramps, fast or irregular heartbeat, joint pain Side effects that usually do not require medical attention (report to your care team if they continue or are bothersome): Constipation Diarrhea Fatigue Loss of appetite Nausea This list may not describe all possible side effects. Call your doctor for medical advice about side effects. You may report side effects to FDA at 1-800-FDA-1088. Where should I keep my medication? This medication is given in a hospital or clinic.  It will not be stored at home. NOTE: This sheet is a summary. It may not cover all possible information. If you have questions about this medicine, talk to your doctor, pharmacist, or health care provider.  2023 Elsevier/Gold Standard (2021-05-21 00:00:00)    

## 2021-10-28 NOTE — Assessment & Plan Note (Signed)
Chemotherapy plan as listed above 

## 2021-10-28 NOTE — Progress Notes (Signed)
Hematology/Oncology Progress note [phone] [phone]        REFERRING PROVIDER: [name], MD    ASSESSMENT & PLAN:   Cancer Staging  Multiple myeloma not having achieved remission [facility]) Staging form: Plasma Cell Myeloma and Plasma Cell Disorders, AJCC 8th Edition - Clinical stage from 08/20/2021: RISS Stage II (Beta-2-microglobulin (mg/L): 3.6, Albumin (g/dL): 2.2, ISS: Stage II, High-risk cytogenetics: Absent, LDH: Normal) - Signed by [name], MD on 10/14/2021   Multiple myeloma not having achieved remission ([place]) # IgG lamda multiple myeloma On chemotherapy plan with lenalidomide (25 mg daily on days 1-14), subcutaneous bortezomib (1.3 mg/m2 on days 1, 4, 8, and 11), and oral dexamethasone (20 mg on days 1, 2, 8, 9, 15, and 16). SubQ daratumumab on days 1, 8, and 15 of cycles 1 through 4 and day 1 of post-ASCT consolidation cycles (cycles 5 and 6).   Labs are reviewed and discussed with patient. Proceed with Cycle 4 Dara RVD. Continue  Aspirin 37m daily Refer to [place] #Hypoalbuminemia, follow up with nutritionist #Hyperuricemia, continue allopurinol  Metastatic cancer to bone ([facility] PET scan showed multiple osseous lesions.  Awaiting dental clearance for bone strengthening agents- Dentist recommends wisdom teeth extraction.  Currently pain is well controlled. Discussed with Neurosurgeon, no intervention needed.  Radonc recommends no RT for now  Encounter for antineoplastic chemotherapy Chemotherapy plan as listed above.   CKD (chronic kidney disease) Avoid nephrotoxins, encourage oral hydration   Recommend patient to obtain dental clearance Patient gets dexamethasone 254mon Day 1, 8, 15 along with his treatment. . Recommend him to take oral dexamethasone 20 mg, day 2, 9,16.   Orders Placed This Encounter  Procedures   Kappa/lambda light chains    Standing Status:   Future    Standing Expiration Date:   11/19/2022    Multiple Myeloma Panel (SPEP&IFE w/QIG)    Standing Status:   Future    Standing Expiration Date:   11/19/2022   CBC with Differential    Standing Status:   Future    Standing Expiration Date:   11/19/2022   Comprehensive metabolic panel    Standing Status:   Future    Standing Expiration Date:   11/19/2022   CBC with Differential    Standing Status:   Future    Standing Expiration Date:   11/26/2022   Comprehensive metabolic panel    Standing Status:   Future    Standing Expiration Date:   11/26/2022   Kappa/lambda light chains    Standing Status:   Future    Standing Expiration Date:   12/10/2022   Multiple Myeloma Panel (SPEP&IFE w/QIG)    Standing Status:   Future    Standing Expiration Date:   12/10/2022   CBC with Differential    Standing Status:   Future    Standing Expiration Date:   12/10/2022   Comprehensive metabolic panel    Standing Status:   Future    Standing Expiration Date:   12/10/2022   CBC with Differential    Standing Status:   Future    Standing Expiration Date:   12/17/2022   Comprehensive metabolic panel    Standing Status:   Future    Standing Expiration Date:   12/17/2022    Follow up  3 weeks.  All questions were answered. The patient knows to call the clinic with any problems, questions or concerns. No barriers to learning was detected.  ZhEarlie ServerMD 10/28/2021  CHIEF COMPLAINTS/PURPOSE OF CONSULTATION:  Multiple myeloma  HISTORY OF PRESENTING ILLNESS:  Tanner [name] 36 y.o. male presents  for follow up of Multiple myeloma I have reviewed his chart and materials related to his cancer extensively and collaborated history with the patient. Summary of oncologic history is as follows: Oncology History  Multiple myeloma not having achieved remission ([place])  08/14/2021 Imaging   CT chest abdomen pelvis without contrast Showed numerous lucencies throughout the bone structures, most pronounced throughout the spine and left left iliac bone.   Otherwise no acute findings in the chest abdomen or pelvis.  Sigmoid diverticulosis.   08/19/2021 Initial Diagnosis   Multiple myeloma not having achieved remission, stage II  -08/15/2021, bone marrow biopsy showed plasma cell myeloma involving greater than 80% of the marrow. Cytogenetics Myeloma  FISH   08/20/2021 Cancer Staging   Staging form: Plasma Cell Myeloma and Plasma Cell Disorders, AJCC 8th Edition - Clinical stage from 08/20/2021: RISS Stage II (Beta-2-microglobulin (mg/L): 3.6, Albumin (g/dL): 2.2, ISS: Stage II, High-risk cytogenetics: Absent, LDH: Normal) - Signed by [name], MD on 10/14/2021 Beta 2 microglobulin range (mg/L): 3.5 to 5.49 Albumin range (g/dL): Less than 3.5 Cytogenetics: No abnormalities   08/20/2021 -  Chemotherapy   MYELOMA NEWLY DIAGNOSED TRANSPLANT CANDIDATE DaraVRd (Daratumumab SUBQ) q21d x 6 Cycles (Induction/Consolidation)      09/11/2021 Imaging   PET scan showed  1. Extensive multifocal hypermetabolic disease throughout the axial and appendicular skeleton, with numerous lucent osseous lesions,consistent with patient's known diagnosis of multiple myeloma. 2. No tracer avid lymph node or mass identified to suggest soft tissue plasmacytoma     INTERVAL HISTORY Tanner [name] is a 36 y.o. male who has above history reviewed by me today presents for follow up visit for management of multiple myeloma Denies any neuropathy symptoms. Tolerates Revlimid, no diarrhea Tachycardia has improved.  No palpitation, chest pain, shortness of breath. + chronic back pain stable.  + fatigue, stable, not worse. No nausea vomiting diarrhea.  Denies numbness and tingling He takes potassium supplementation.   MEDICAL HISTORY:  Past Medical History:  Diagnosis Date   Alcohol use    Sciatica    Smoking     SURGICAL HISTORY: Past Surgical History:  Procedure Laterality Date   INGUINAL HERNIA REPAIR      SOCIAL HISTORY: Social History    Socioeconomic History   Marital status: Single    Spouse name: Not on file   Number of children: Not on file   Years of education: Not on file   Highest education level: Not on file  Occupational History   Not on file  Tobacco Use   Smoking status: Former    Packs/day: 0.50    Years: 10.00    Total pack years: 5.00    Types: E-cigarettes, Cigarettes    Quit date: 08/11/2021    Years since quitting: 0.2   Smokeless tobacco: Never  Vaping Use   Vaping Use: Every day  Substance and Sexual Activity   Alcohol use: Not Currently    Alcohol/week: 24.0 standard drinks of alcohol    Types: 24 Cans of beer per week    Comment: quit drinking approx 08/11/2021   Drug use: Never   Sexual activity: Not on file  Other Topics Concern   Not on file  Social History Narrative   Not on file   Social Determinants of Health   Financial Resource Strain: Not on file  Food Insecurity: Not on file  Transportation  Needs: Not on file  Physical Activity: Not on file  Stress: Not on file  Social Connections: Not on file  Intimate Partner Violence: Not on file    FAMILY HISTORY: Family History  Problem Relation Age of Onset   Ovarian cancer Mother    Asthma Mother    Alcoholism Father    Throat cancer Maternal Uncle    Heart disease Maternal Uncle    Lung cancer Maternal Uncle    Lymphoma Maternal Uncle    Lung cancer Paternal Grandmother     ALLERGIES:  has No Known Allergies.  MEDICATIONS:  Current Outpatient Medications  Medication Sig Dispense Refill   acetaminophen-codeine (TYLENOL #3) 300-30 MG tablet Take 1 tablet by mouth every 6 (six) hours as needed for moderate pain. 30 tablet 0   acyclovir (ZOVIRAX) 400 MG tablet Take 1 tablet (400 mg total) by mouth 2 (two) times daily. 60 tablet 5   allopurinol (ZYLOPRIM) 100 MG tablet Take 0.5 tablets (50 mg total) by mouth daily. 30 tablet 2   dexamethasone (DECADRON) 4 MG tablet Take 5 tablets (20 mg total) by mouth See admin  instructions. take dexamethasone 20 mg, day 2, 9,16. 60 tablet 0   potassium chloride SA (KLOR-CON M) 20 MEQ tablet Take 1 tablet (20 mEq total) by mouth daily. 30 tablet 0   ondansetron (ZOFRAN) 8 MG tablet Take 1 tablet (8 mg total) by mouth 2 (two) times daily as needed for nausea or vomiting. (Patient not taking: Reported on 10/28/2021) 20 tablet 2   prochlorperazine (COMPAZINE) 10 MG tablet Take 1 tablet (10 mg total) by mouth every 6 (six) hours as needed for nausea or vomiting. (Patient not taking: Reported on 10/28/2021) 30 tablet 2   REVLIMID 25 MG capsule TAKE 1 CAPSULE BY MOUTH ONCE DAILY FOR 14 DAYS ON AND 7 DAYS OFF (Patient not taking: Reported on 10/28/2021) 14 capsule 0   No current facility-administered medications for this visit.    Review of Systems  Constitutional:  Positive for fatigue. Negative for appetite change, chills, fever and unexpected weight change.  HENT:   Negative for hearing loss and voice change.   Eyes:  Negative for eye problems and icterus.  Respiratory:  Negative for chest tightness, cough and shortness of breath.   Cardiovascular:  Negative for chest pain and leg swelling.  Gastrointestinal:  Negative for abdominal distention and abdominal pain.  Endocrine: Negative for hot flashes.  Genitourinary:  Negative for difficulty urinating, dysuria and frequency.   Musculoskeletal:  Positive for back pain. Negative for arthralgias.  Skin:  Negative for itching and rash.  Neurological:  Negative for light-headedness and numbness.  Hematological:  Negative for adenopathy. Does not bruise/bleed easily.  Psychiatric/Behavioral:  Negative for confusion.      PHYSICAL EXAMINATION: ECOG PERFORMANCE STATUS: 1 - Symptomatic but completely ambulatory  Vitals:   10/28/21 1306  BP: 114/65  Pulse: 90  SpO2: 100%   Filed Weights   10/28/21 1306  Weight: 235 lb 6.4 oz (106.8 kg)    Physical Exam Constitutional:      General: He is not in acute distress.     Appearance: He is obese. He is not diaphoretic.  HENT:     Head: Normocephalic and atraumatic.     Nose: Nose normal.     Mouth/Throat:     Pharynx: No oropharyngeal exudate.  Eyes:     General: No scleral icterus.    Pupils: Pupils are equal, round, and reactive to light.  Cardiovascular:     Rate and Rhythm: Normal rate.     Heart sounds: No murmur heard. Pulmonary:     Effort: Pulmonary effort is normal. No respiratory distress.     Breath sounds: No rales.  Chest:     Chest wall: No tenderness.  Abdominal:     General: There is no distension.     Palpations: Abdomen is soft.     Tenderness: There is no abdominal tenderness.  Musculoskeletal:        General: Normal range of motion.     Cervical back: Normal range of motion and neck supple.  Skin:    General: Skin is warm and dry.     Findings: No erythema.  Neurological:     Mental Status: He is alert and oriented to person, place, and time.     Cranial Nerves: No cranial nerve deficit.     Motor: No abnormal muscle tone.     Coordination: Coordination normal.  Psychiatric:        Mood and Affect: Mood and affect normal.      LABORATORY DATA:  I have reviewed the data as listed    Latest Ref Rng & Units 10/28/2021   12:46 PM 10/21/2021    2:08 PM 10/14/2021    1:09 PM  CBC  WBC 4.0 - 10.5 K/uL 4.4  5.5  4.1   Hemoglobin 13.0 - 17.0 g/dL 10.0  9.5  9.4   Hematocrit 39.0 - 52.0 % 31.0  27.7  28.2   Platelets 150 - 400 K/uL 370  102  174       Latest Ref Rng & Units 10/28/2021   12:46 PM 10/21/2021    2:08 PM 10/14/2021    1:09 PM  CMP  Glucose 70 - 99 mg/dL 90  123  96   BUN 6 - 20 mg/dL 9  9  8    Creatinine 0.61 - 1.24 mg/dL 0.81  0.83  0.79   Sodium 135 - 145 mmol/L 138  137  137   Potassium 3.5 - 5.1 mmol/L 4.4  2.8  3.3   Chloride 98 - 111 mmol/L 106  105  103   CO2 22 - 32 mmol/L 27  24  30    Calcium 8.9 - 10.3 mg/dL 8.8  8.3  8.1   Total Protein 6.5 - 8.1 g/dL 7.0  6.6  6.9   Total Bilirubin  0.3 - 1.2 mg/dL 1.0  1.2  1.2   Alkaline Phos 38 - 126 U/L 110  132  220   AST 15 - 41 U/L 15  24  19    ALT 0 - 44 U/L 14  17  47        RADIOGRAPHIC STUDIES: I have personally reviewed the radiological images as listed and agreed with the findings in the report. No results found.

## 2021-10-28 NOTE — Progress Notes (Signed)
Nutrition Follow-up:  Patient with multiple myeloma.  Patient receiving chemotherapy.    Met with patient and mother during infusion.  Patient reports that his appetite is mostly up but some days does not eat as much.  Mother helping with meal preparation.  Drinking oral nutrition supplements.      Medications: reviewed  Labs: reviewed  Anthropometrics:   Weight 235 lb today  226 lb 8 oz on 9/26 223 lb on 8/28 222 lb on 8/21 240 lb on 3/22    NUTRITION DIAGNOSIS: Inadequate oral intake improving   INTERVENTION:  Mother has [employer] milk at home and will have patient try. Continue high protein foods Will add more vegetables in diet Continue oral nutrition supplements    MONITORING, EVALUATION, GOAL: weight trends, intake   NEXT VISIT: as needed  Tanner [name] B. Zenia Resides, [place], [place] Registered Dietitian [phone]

## 2021-10-28 NOTE — Assessment & Plan Note (Signed)
#  IgG lamda multiple myeloma On chemotherapy plan with lenalidomide (25 mg daily on days 1-14), subcutaneous bortezomib (1.3 mg/m2 on days 1, 4, 8, and 11), and oral dexamethasone (20 mg on days 1, 2, 8, 9, 15, and 16). SubQ daratumumab on days 1, 8, and 15 of cycles 1 through 4 and day 1 of post-ASCT consolidation cycles (cycles 5 and 6).   Labs are reviewed and discussed with patient. Proceed with Cycle 4 Dara RVD. Continue  Aspirin 73m daily Refer to [place] #Hypoalbuminemia, follow up with nutritionist #Hyperuricemia, continue allopurinol

## 2021-10-28 NOTE — Assessment & Plan Note (Signed)
Avoid nephrotoxins, encourage oral hydration 

## 2021-10-29 ENCOUNTER — Telehealth: Payer: Self-pay

## 2021-10-29 LAB — KAPPA/LAMBDA LIGHT CHAINS
Kappa free light chain: 5 mg/L (ref 3.3–19.4)
Kappa, lambda light chain ratio: 1.39 (ref 0.26–1.65)
Lambda free light chains: 3.6 mg/L — ABNORMAL LOW (ref 5.7–26.3)

## 2021-10-29 NOTE — Telephone Encounter (Signed)
Called Dr. Mertha Finders office ([facility]) to follow up on referral. Spoke to Amy and she said she would be giving him a call to set up appt.

## 2021-10-30 ENCOUNTER — Other Ambulatory Visit: Payer: Self-pay

## 2021-10-31 ENCOUNTER — Other Ambulatory Visit: Payer: Self-pay | Admitting: Oncology

## 2021-10-31 ENCOUNTER — Ambulatory Visit: Payer: Commercial Managed Care - PPO

## 2021-10-31 ENCOUNTER — Ambulatory Visit: Payer: Commercial Managed Care - PPO | Admitting: Cardiology

## 2021-10-31 ENCOUNTER — Inpatient Hospital Stay: Payer: Commercial Managed Care - PPO

## 2021-10-31 VITALS — BP 148/94 | HR 110 | Temp 98.0°F | Resp 20

## 2021-10-31 DIAGNOSIS — Z5112 Encounter for antineoplastic immunotherapy: Secondary | ICD-10-CM | POA: Diagnosis not present

## 2021-10-31 DIAGNOSIS — C9000 Multiple myeloma not having achieved remission: Secondary | ICD-10-CM

## 2021-10-31 MED ORDER — BORTEZOMIB CHEMO SQ INJECTION 3.5 MG (2.5MG/ML)
1.3000 mg/m2 | Freq: Once | INTRAMUSCULAR | Status: AC
  Administered 2021-10-31: 3 mg via SUBCUTANEOUS
  Filled 2021-10-31: qty 1.2

## 2021-10-31 MED ORDER — PROCHLORPERAZINE MALEATE 10 MG PO TABS
10.0000 mg | ORAL_TABLET | Freq: Once | ORAL | Status: AC
  Administered 2021-10-31: 10 mg via ORAL
  Filled 2021-10-31: qty 1

## 2021-11-01 ENCOUNTER — Other Ambulatory Visit: Payer: Self-pay

## 2021-11-03 LAB — MULTIPLE MYELOMA PANEL, SERUM
Albumin SerPl Elph-Mcnc: 3.5 g/dL (ref 2.9–4.4)
Albumin/Glob SerPl: 1.3 (ref 0.7–1.7)
Alpha 1: 0.3 g/dL (ref 0.0–0.4)
Alpha2 Glob SerPl Elph-Mcnc: 0.6 g/dL (ref 0.4–1.0)
B-Globulin SerPl Elph-Mcnc: 1 g/dL (ref 0.7–1.3)
Gamma Glob SerPl Elph-Mcnc: 0.9 g/dL (ref 0.4–1.8)
Globulin, Total: 2.9 g/dL (ref 2.2–3.9)
IgA: 20 mg/dL — ABNORMAL LOW (ref 90–386)
IgG (Immunoglobin G), Serum: 1092 mg/dL (ref 603–1613)
IgM (Immunoglobulin M), Srm: 51 mg/dL (ref 20–172)
M Protein SerPl Elph-Mcnc: 0.8 g/dL — ABNORMAL HIGH
Total Protein ELP: 6.4 g/dL (ref 6.0–8.5)

## 2021-11-04 ENCOUNTER — Inpatient Hospital Stay: Payer: Commercial Managed Care - PPO

## 2021-11-04 VITALS — BP 115/77 | HR 87 | Temp 98.3°F | Resp 16

## 2021-11-04 DIAGNOSIS — C9000 Multiple myeloma not having achieved remission: Secondary | ICD-10-CM

## 2021-11-04 DIAGNOSIS — Z5112 Encounter for antineoplastic immunotherapy: Secondary | ICD-10-CM | POA: Diagnosis not present

## 2021-11-04 LAB — CBC WITH DIFFERENTIAL/PLATELET
Abs Immature Granulocytes: 0.03 10*3/uL (ref 0.00–0.07)
Basophils Absolute: 0 10*3/uL (ref 0.0–0.1)
Basophils Relative: 1 %
Eosinophils Absolute: 0.2 10*3/uL (ref 0.0–0.5)
Eosinophils Relative: 5 %
HCT: 30.6 % — ABNORMAL LOW (ref 39.0–52.0)
Hemoglobin: 10.2 g/dL — ABNORMAL LOW (ref 13.0–17.0)
Immature Granulocytes: 1 %
Lymphocytes Relative: 17 %
Lymphs Abs: 0.7 10*3/uL (ref 0.7–4.0)
MCH: 29.4 pg (ref 26.0–34.0)
MCHC: 33.3 g/dL (ref 30.0–36.0)
MCV: 88.2 fL (ref 80.0–100.0)
Monocytes Absolute: 0.3 10*3/uL (ref 0.1–1.0)
Monocytes Relative: 7 %
Neutro Abs: 2.7 10*3/uL (ref 1.7–7.7)
Neutrophils Relative %: 69 %
Platelets: 235 10*3/uL (ref 150–400)
RBC: 3.47 MIL/uL — ABNORMAL LOW (ref 4.22–5.81)
RDW: 15.9 % — ABNORMAL HIGH (ref 11.5–15.5)
WBC: 3.8 10*3/uL — ABNORMAL LOW (ref 4.0–10.5)
nRBC: 0.8 % — ABNORMAL HIGH (ref 0.0–0.2)

## 2021-11-04 LAB — COMPREHENSIVE METABOLIC PANEL
ALT: 19 U/L (ref 0–44)
AST: 20 U/L (ref 15–41)
Albumin: 3.7 g/dL (ref 3.5–5.0)
Alkaline Phosphatase: 93 U/L (ref 38–126)
Anion gap: 7 (ref 5–15)
BUN: 12 mg/dL (ref 6–20)
CO2: 24 mmol/L (ref 22–32)
Calcium: 9 mg/dL (ref 8.9–10.3)
Chloride: 105 mmol/L (ref 98–111)
Creatinine, Ser: 0.68 mg/dL (ref 0.61–1.24)
GFR, Estimated: >60 mL/min (ref >60)
Glucose, Bld: 84 mg/dL (ref 70–99)
Potassium: 3.8 mmol/L (ref 3.5–5.1)
Sodium: 136 mmol/L (ref 135–145)
Total Bilirubin: 0.8 mg/dL (ref 0.3–1.2)
Total Protein: 6.6 g/dL (ref 6.5–8.1)

## 2021-11-04 LAB — SAMPLE TO BLOOD BANK

## 2021-11-04 MED ORDER — MONTELUKAST SODIUM 10 MG PO TABS
10.0000 mg | ORAL_TABLET | Freq: Once | ORAL | Status: AC
  Administered 2021-11-04: 10 mg via ORAL
  Filled 2021-11-04: qty 1

## 2021-11-04 MED ORDER — BORTEZOMIB CHEMO SQ INJECTION 3.5 MG (2.5MG/ML)
1.3000 mg/m2 | Freq: Once | INTRAMUSCULAR | Status: AC
  Administered 2021-11-04: 3 mg via SUBCUTANEOUS
  Filled 2021-11-04: qty 1.2

## 2021-11-04 MED ORDER — DARATUMUMAB-HYALURONIDASE-FIHJ 1800-30000 MG-UT/15ML ~~LOC~~ SOLN
1800.0000 mg | Freq: Once | SUBCUTANEOUS | Status: AC
  Administered 2021-11-04: 1800 mg via SUBCUTANEOUS
  Filled 2021-11-04: qty 15

## 2021-11-04 MED ORDER — DEXAMETHASONE 4 MG PO TABS
20.0000 mg | ORAL_TABLET | Freq: Once | ORAL | Status: AC
  Administered 2021-11-04: 20 mg via ORAL
  Filled 2021-11-04: qty 5

## 2021-11-04 MED ORDER — ACETAMINOPHEN 325 MG PO TABS
650.0000 mg | ORAL_TABLET | Freq: Once | ORAL | Status: AC
  Administered 2021-11-04: 650 mg via ORAL
  Filled 2021-11-04: qty 2

## 2021-11-04 MED ORDER — DIPHENHYDRAMINE HCL 25 MG PO CAPS
50.0000 mg | ORAL_CAPSULE | Freq: Once | ORAL | Status: AC
  Administered 2021-11-04: 50 mg via ORAL
  Filled 2021-11-04: qty 2

## 2021-11-04 NOTE — Patient Instructions (Signed)
[facility] [place]  Discharge Instructions: Thank you for choosing [facility] to provide your oncology and hematology care.  If you have a lab appointment with the [facility], please go directly to the [facility] and check in at the registration area.  Wear comfortable clothing and clothing appropriate for easy access to any Portacath or PICC line.   We strive to give you quality time with your provider. You may need to reschedule your appointment if you arrive late (15 or more minutes).  Arriving late affects you and other patients whose appointments are after yours.  Also, if you miss three or more appointments without notifying the office, you may be dismissed from the clinic at the provider's discretion.      For prescription refill requests, have your pharmacy contact our office and allow 72 hours for refills to be completed.    Today you received the following chemotherapy and/or immunotherapy agents DARZALEX and VELCADE      To help prevent nausea and vomiting after your treatment, we encourage you to take your nausea medication as directed.  BELOW ARE SYMPTOMS THAT SHOULD BE REPORTED IMMEDIATELY: *FEVER GREATER THAN 100.4 F (38 C) OR HIGHER *CHILLS OR SWEATING *NAUSEA AND VOMITING THAT IS NOT CONTROLLED WITH YOUR NAUSEA MEDICATION *UNUSUAL SHORTNESS OF BREATH *UNUSUAL BRUISING OR BLEEDING *URINARY PROBLEMS (pain or burning when urinating, or frequent urination) *BOWEL PROBLEMS (unusual diarrhea, constipation, pain near the anus) TENDERNESS IN MOUTH AND THROAT WITH OR WITHOUT PRESENCE OF ULCERS (sore throat, sores in mouth, or a toothache) UNUSUAL RASH, SWELLING OR PAIN  UNUSUAL VAGINAL DISCHARGE OR ITCHING   Items with * indicate a potential emergency and should be followed up as soon as possible or go to the Emergency Department if any problems should occur.  Please show the CHEMOTHERAPY ALERT CARD or IMMUNOTHERAPY ALERT CARD at  check-in to the Emergency Department and triage nurse.  Should you have questions after your visit or need to cancel or reschedule your appointment, please contact [facility] [place] AT [place]  [phone] and follow the prompts.  Office hours are 8:00 a.m. to 4:30 p.m. Monday - Friday. Please note that voicemails left after 4:00 p.m. may not be returned until the following business day.  We are closed weekends and major holidays. You have access to a nurse at all times for urgent questions. Please call the main number to the clinic [phone] and follow the prompts.  For any non-urgent questions, you may also contact your provider using MyChart. We now offer e-Visits for anyone 17 and older to request care online for non-urgent symptoms. For details visit [email or web address].   Also download the MyChart app! Go to the app store, search "MyChart", open the app, select [facility], and log in with your MyChart username and password.  Masks are optional in the cancer centers. If you would like for your care team to wear a mask while they are taking care of you, please let them know. For doctor visits, patients may have with them one support person who is at least 35 years old. At this time, visitors are not allowed in the infusion area.  Daratumumab Injection What is this medication? DARATUMUMAB (dar a toom ue mab) treats multiple myeloma, a type of bone marrow cancer. It works by helping your immune system slow or stop the spread of cancer cells. It is a monoclonal antibody. This medicine may be used for other purposes; ask your health care provider  or pharmacist if you have questions. COMMON BRAND NAME(S): DARZALEX What should I tell my care team before I take this medication? They need to know if you have any of these conditions: Hereditary fructose intolerance Infection, such as chickenpox, herpes, hepatitis B virus Lung or breathing disease, such as asthma, COPD An  unusual or allergic reaction to daratumumab, sorbitol, other medications, foods, dyes, or preservatives Pregnant or trying to get pregnant Breast-feeding How should I use this medication? This medication is injected into a vein. It is given by your care team in a hospital or clinic setting. Talk to your care team about the use of this medication in children. Special care may be needed. Overdosage: If you think you have taken too much of this medicine contact a poison control center or emergency room at once. NOTE: This medicine is only for you. Do not share this medicine with others. What if I miss a dose? Keep appointments for follow-up doses. It is important not to miss your dose. Call your care team if you are unable to keep an appointment. What may interact with this medication? Interactions have not been studied. This list may not describe all possible interactions. Give your health care provider a list of all the medicines, herbs, non-prescription drugs, or dietary supplements you use. Also tell them if you smoke, drink alcohol, or use illegal drugs. Some items may interact with your medicine. What should I watch for while using this medication? Your condition will be monitored carefully while you are receiving this medication. This medication can cause serious allergic reactions. To reduce your risk, your care team may give you other medication to take before receiving this one. Be sure to follow the directions from your care team. This medication can affect the results of blood tests to match your blood type. These changes can last for up to 6 months after the final dose. Your care team will do blood tests to match your blood type before you start treatment. Tell all of your care team that you are being treated with this medication before receiving a blood transfusion. This medication can affect the results of some tests used to determine treatment response; extra tests may be needed to  evaluate response. Talk to your care team if you wish to become pregnant or think you are pregnant. This medication can cause serious birth defects if taken during pregnancy and for 3 months after the last dose. A reliable form of contraception is recommended while taking this medication and for 3 months after the last dose. Talk to your care team about effective forms of contraception. Do not breast-feed while taking this medication. What side effects may I notice from receiving this medication? Side effects that you should report to your care team as soon as possible: Allergic reactions--skin rash, itching, hives, swelling of the face, lips, tongue, or throat Infection--fever, chills, cough, sore throat, wounds that don't heal, pain or trouble when passing urine, general feeling of discomfort or being unwell Infusion reactions--chest pain, shortness of breath or trouble breathing, feeling faint or lightheaded Unusual bruising or bleeding Side effects that usually do not require medical attention (report to your care team if they continue or are bothersome): Constipation Diarrhea Fatigue Nausea Pain, tingling, or numbness in the hands or feet Swelling of the ankles, hands, or feet This list may not describe all possible side effects. Call your doctor for medical advice about side effects. You may report side effects to FDA at 1-800-FDA-1088. Where should I  keep my medication? This medication is given in a hospital or clinic. It will not be stored at home. NOTE: This sheet is a summary. It may not cover all possible information. If you have questions about this medicine, talk to your doctor, pharmacist, or health care provider.  2023 Elsevier/Gold Standard (2013-11-20 00:00:00)  Bortezomib Injection What is this medication? BORTEZOMIB (bor TEZ oh mib) treats lymphoma. It may also be used to treat multiple myeloma, a type of bone marrow cancer. It works by blocking a protein that causes  cancer cells to grow and multiply. This helps to slow or stop the spread of cancer cells. This medicine may be used for other purposes; ask your health care provider or pharmacist if you have questions. COMMON BRAND NAME(S): Velcade What should I tell my care team before I take this medication? They need to know if you have any of these conditions: Dehydration Diabetes Heart disease Liver disease Tingling of the fingers or toes or other nerve disorder An unusual or allergic reaction to bortezomib, other medications, foods, dyes, or preservatives If you or your partner are pregnant or trying to get pregnant Breastfeeding How should I use this medication? This medication is injected into a vein or under the skin. It is given by your care team in a hospital or clinic setting. Talk to your care team about the use of this medication in children. Special care may be needed. Overdosage: If you think you have taken too much of this medicine contact a poison control center or emergency room at once. NOTE: This medicine is only for you. Do not share this medicine with others. What if I miss a dose? Keep appointments for follow-up doses. It is important not to miss your dose. Call your care team if you are unable to keep an appointment. What may interact with this medication? Ketoconazole Rifampin This list may not describe all possible interactions. Give your health care provider a list of all the medicines, herbs, non-prescription drugs, or dietary supplements you use. Also tell them if you smoke, drink alcohol, or use illegal drugs. Some items may interact with your medicine. What should I watch for while using this medication? Your condition will be monitored carefully while you are receiving this medication. You may need blood work while taking this medication. This medication may affect your coordination, reaction time, or judgment. Do not drive or operate machinery until you know how this  medication affects you. Sit up or stand slowly to reduce the risk of dizzy or fainting spells. Drinking alcohol with this medication can increase the risk of these side effects. This medication may increase your risk of getting an infection. Call your care team for advice if you get a fever, chills, sore throat, or other symptoms of a cold or flu. Do not treat yourself. Try to avoid being around people who are sick. Check with your care team if you have severe diarrhea, nausea, and vomiting, or if you sweat a lot. The loss of too much body fluid may make it dangerous for you to take this medication. Talk to your care team if you may be pregnant. Serious birth defects can occur if you take this medication during pregnancy and for 7 months after the last dose. You will need a negative pregnancy test before starting this medication. Contraception is recommended while taking this medication and for 7 months after the last dose. Your care team can help you find the option that works for you. If your  partner can get pregnant, use a condom during sex while taking this medication and for 4 months after the last dose. Do not breastfeed while taking this medication and for 2 months after the last dose. This medication may cause infertility. Talk to your care team if you are concerned about your fertility. What side effects may I notice from receiving this medication? Side effects that you should report to your care team as soon as possible: Allergic reactions--skin rash, itching, hives, swelling of the face, lips, tongue, or throat Bleeding--bloody or black, tar-like stools, vomiting blood or brown material that looks like coffee grounds, red or dark brown urine, small red or purple spots on skin, unusual bruising or bleeding Bleeding in the brain--severe headache, stiff neck, confusion, dizziness, change in vision, numbness or weakness of the face, arm, or leg, trouble speaking, trouble walking, vomiting Bowel  blockage--stomach cramping, unable to have a bowel movement or pass gas, loss of appetite, vomiting Heart failure--shortness of breath, swelling of the ankles, feet, or hands, sudden weight gain, unusual weakness or fatigue Infection--fever, chills, cough, sore throat, wounds that don't heal, pain or trouble when passing urine, general feeling of discomfort or being unwell Liver injury--right upper belly pain, loss of appetite, nausea, light-colored stool, dark yellow or brown urine, yellowing skin or eyes, unusual weakness or fatigue Low blood pressure--dizziness, feeling faint or lightheaded, blurry vision Lung injury--shortness of breath or trouble breathing, cough, spitting up blood, chest pain, fever Pain, tingling, or numbness in the hands or feet Severe or prolonged diarrhea Stomach pain, bloody diarrhea, pale skin, unusual weakness or fatigue, decrease in the amount of urine, which may be signs of hemolytic uremic syndrome Sudden and severe headache, confusion, change in vision, seizures, which may be signs of posterior reversible encephalopathy syndrome (PRES) TTP--purple spots on the skin or inside the mouth, pale skin, yellowing skin or eyes, unusual weakness or fatigue, fever, fast or irregular heartbeat, confusion, change in vision, trouble speaking, trouble walking Tumor lysis syndrome (TLS)--nausea, vomiting, diarrhea, decrease in the amount of urine, dark urine, unusual weakness or fatigue, confusion, muscle pain or cramps, fast or irregular heartbeat, joint pain Side effects that usually do not require medical attention (report to your care team if they continue or are bothersome): Constipation Diarrhea Fatigue Loss of appetite Nausea This list may not describe all possible side effects. Call your doctor for medical advice about side effects. You may report side effects to FDA at 1-800-FDA-1088. Where should I keep my medication? This medication is given in a hospital or clinic.  It will not be stored at home. NOTE: This sheet is a summary. It may not cover all possible information. If you have questions about this medicine, talk to your doctor, pharmacist, or health care provider.  2023 Elsevier/Gold Standard (2021-05-21 00:00:00)

## 2021-11-07 ENCOUNTER — Inpatient Hospital Stay: Payer: Commercial Managed Care - PPO | Attending: Oncology

## 2021-11-07 VITALS — BP 134/79 | HR 97 | Temp 98.7°F | Resp 17 | Wt 242.5 lb

## 2021-11-07 DIAGNOSIS — C9000 Multiple myeloma not having achieved remission: Secondary | ICD-10-CM | POA: Insufficient documentation

## 2021-11-07 DIAGNOSIS — Z5112 Encounter for antineoplastic immunotherapy: Secondary | ICD-10-CM | POA: Insufficient documentation

## 2021-11-07 DIAGNOSIS — Z23 Encounter for immunization: Secondary | ICD-10-CM | POA: Insufficient documentation

## 2021-11-07 MED ORDER — BORTEZOMIB CHEMO SQ INJECTION 3.5 MG (2.5MG/ML)
1.3000 mg/m2 | Freq: Once | INTRAMUSCULAR | Status: AC
  Administered 2021-11-07: 3 mg via SUBCUTANEOUS
  Filled 2021-11-07: qty 1.2

## 2021-11-07 MED ORDER — PROCHLORPERAZINE MALEATE 10 MG PO TABS
10.0000 mg | ORAL_TABLET | Freq: Once | ORAL | Status: AC
  Administered 2021-11-07: 10 mg via ORAL
  Filled 2021-11-07: qty 1

## 2021-11-07 NOTE — Patient Instructions (Signed)
[facility]  Discharge Instructions: Thank you for choosing [facility] to provide your oncology and hematology care.  If you have a lab appointment with the Cancer Center, please go directly to the Cancer Center and check in at the registration area.  Wear comfortable clothing and clothing appropriate for easy access to any Portacath or PICC line.   We strive to give you quality time with your provider. You may need to reschedule your appointment if you arrive late (15 or more minutes).  Arriving late affects you and other patients whose appointments are after yours.  Also, if you miss three or more appointments without notifying the office, you may be dismissed from the clinic at the provider's discretion.      For prescription refill requests, have your pharmacy contact our office and allow 72 hours for refills to be completed.    Today you received the following chemotherapy and/or immunotherapy agents Velcade      To help prevent nausea and vomiting after your treatment, we encourage you to take your nausea medication as directed.  BELOW ARE SYMPTOMS THAT SHOULD BE REPORTED IMMEDIATELY: *FEVER GREATER THAN 100.4 F (38 C) OR HIGHER *CHILLS OR SWEATING *NAUSEA AND VOMITING THAT IS NOT CONTROLLED WITH YOUR NAUSEA MEDICATION *UNUSUAL SHORTNESS OF BREATH *UNUSUAL BRUISING OR BLEEDING *URINARY PROBLEMS (pain or burning when urinating, or frequent urination) *BOWEL PROBLEMS (unusual diarrhea, constipation, pain near the anus) TENDERNESS IN MOUTH AND THROAT WITH OR WITHOUT PRESENCE OF ULCERS (sore throat, sores in mouth, or a toothache) UNUSUAL RASH, SWELLING OR PAIN  UNUSUAL VAGINAL DISCHARGE OR ITCHING   Items with * indicate a potential emergency and should be followed up as soon as possible or go to the Emergency Department if any problems should occur.  Please show the CHEMOTHERAPY ALERT CARD or IMMUNOTHERAPY ALERT CARD at check-in to the  Emergency Department and triage nurse.  Should you have questions after your visit or need to cancel or reschedule your appointment, please contact [facility]  [phone] and follow the prompts.  Office hours are 8:00 a.m. to 4:30 p.m. Monday - Friday. Please note that voicemails left after 4:00 p.m. may not be returned until the following business day.  We are closed weekends and major holidays. You have access to a nurse at all times for urgent questions. Please call the main number to the clinic [phone] and follow the prompts.  For any non-urgent questions, you may also contact your provider using MyChart. We now offer e-Visits for anyone 18 and older to request care online for non-urgent symptoms. For details visit [email or web address].   Also download the MyChart app! Go to the app store, search "MyChart", open the app, select [facility], and log in with your MyChart username and password.  Masks are optional in the cancer centers. If you would like for your care team to wear a mask while they are taking care of you, please let them know. For doctor visits, patients may have with them one support person who is at least 35 years old. At this time, visitors are not allowed in the infusion area.   

## 2021-11-07 NOTE — Progress Notes (Signed)
Patient tolerated Velcade  injection well, no questions/concerns voiced. Patient stable at discharge. Refused AVS .

## 2021-11-11 ENCOUNTER — Inpatient Hospital Stay: Payer: Commercial Managed Care - PPO

## 2021-11-11 ENCOUNTER — Encounter: Payer: Self-pay | Admitting: Oncology

## 2021-11-11 ENCOUNTER — Ambulatory Visit: Payer: Commercial Managed Care - PPO | Admitting: Oncology

## 2021-11-11 ENCOUNTER — Inpatient Hospital Stay (HOSPITAL_BASED_OUTPATIENT_CLINIC_OR_DEPARTMENT_OTHER): Payer: Commercial Managed Care - PPO | Admitting: Oncology

## 2021-11-11 VITALS — BP 125/83 | HR 96 | Temp 98.7°F | Resp 18 | Wt 243.7 lb

## 2021-11-11 DIAGNOSIS — C7951 Secondary malignant neoplasm of bone: Secondary | ICD-10-CM | POA: Diagnosis not present

## 2021-11-11 DIAGNOSIS — Z5111 Encounter for antineoplastic chemotherapy: Secondary | ICD-10-CM | POA: Diagnosis not present

## 2021-11-11 DIAGNOSIS — C9000 Multiple myeloma not having achieved remission: Secondary | ICD-10-CM

## 2021-11-11 DIAGNOSIS — Z5112 Encounter for antineoplastic immunotherapy: Secondary | ICD-10-CM | POA: Diagnosis not present

## 2021-11-11 LAB — CBC WITH DIFFERENTIAL/PLATELET
Abs Immature Granulocytes: 0.06 10*3/uL (ref 0.00–0.07)
Basophils Absolute: 0 10*3/uL (ref 0.0–0.1)
Basophils Relative: 0 %
Eosinophils Absolute: 0.1 10*3/uL (ref 0.0–0.5)
Eosinophils Relative: 2 %
HCT: 31.9 % — ABNORMAL LOW (ref 39.0–52.0)
Hemoglobin: 10.5 g/dL — ABNORMAL LOW (ref 13.0–17.0)
Immature Granulocytes: 1 %
Lymphocytes Relative: 16 %
Lymphs Abs: 1.1 10*3/uL (ref 0.7–4.0)
MCH: 28.6 pg (ref 26.0–34.0)
MCHC: 32.9 g/dL (ref 30.0–36.0)
MCV: 86.9 fL (ref 80.0–100.0)
Monocytes Absolute: 1.1 10*3/uL — ABNORMAL HIGH (ref 0.1–1.0)
Monocytes Relative: 16 %
Neutro Abs: 4.5 10*3/uL (ref 1.7–7.7)
Neutrophils Relative %: 65 %
Platelets: 171 10*3/uL (ref 150–400)
RBC: 3.67 MIL/uL — ABNORMAL LOW (ref 4.22–5.81)
RDW: 15.6 % — ABNORMAL HIGH (ref 11.5–15.5)
WBC: 6.8 10*3/uL (ref 4.0–10.5)
nRBC: 1.2 % — ABNORMAL HIGH (ref 0.0–0.2)

## 2021-11-11 LAB — COMPREHENSIVE METABOLIC PANEL
ALT: 17 U/L (ref 0–44)
AST: 19 U/L (ref 15–41)
Albumin: 3.8 g/dL (ref 3.5–5.0)
Alkaline Phosphatase: 86 U/L (ref 38–126)
Anion gap: 8 (ref 5–15)
BUN: 13 mg/dL (ref 6–20)
CO2: 26 mmol/L (ref 22–32)
Calcium: 8.8 mg/dL — ABNORMAL LOW (ref 8.9–10.3)
Chloride: 103 mmol/L (ref 98–111)
Creatinine, Ser: 0.9 mg/dL (ref 0.61–1.24)
GFR, Estimated: >60 mL/min (ref >60)
Glucose, Bld: 95 mg/dL (ref 70–99)
Potassium: 3.8 mmol/L (ref 3.5–5.1)
Sodium: 137 mmol/L (ref 135–145)
Total Bilirubin: 1.3 mg/dL — ABNORMAL HIGH (ref 0.3–1.2)
Total Protein: 6.9 g/dL (ref 6.5–8.1)

## 2021-11-11 LAB — SAMPLE TO BLOOD BANK

## 2021-11-11 MED ORDER — ACETAMINOPHEN 325 MG PO TABS
650.0000 mg | ORAL_TABLET | Freq: Once | ORAL | Status: AC
  Administered 2021-11-11: 650 mg via ORAL
  Filled 2021-11-11: qty 2

## 2021-11-11 MED ORDER — DARATUMUMAB-HYALURONIDASE-FIHJ 1800-30000 MG-UT/15ML ~~LOC~~ SOLN
1800.0000 mg | Freq: Once | SUBCUTANEOUS | Status: AC
  Administered 2021-11-11: 1800 mg via SUBCUTANEOUS
  Filled 2021-11-11: qty 15

## 2021-11-11 MED ORDER — DEXAMETHASONE 4 MG PO TABS
20.0000 mg | ORAL_TABLET | Freq: Once | ORAL | Status: AC
  Administered 2021-11-11: 20 mg via ORAL
  Filled 2021-11-11: qty 5

## 2021-11-11 MED ORDER — MONTELUKAST SODIUM 10 MG PO TABS: 10.0000 mg | ORAL_TABLET | Freq: Every day | ORAL | Status: DC

## 2021-11-11 MED ORDER — MONTELUKAST SODIUM 10 MG PO TABS
10.0000 mg | ORAL_TABLET | Freq: Once | ORAL | Status: AC
  Administered 2021-11-11: 10 mg via ORAL
  Filled 2021-11-11: qty 1

## 2021-11-11 MED ORDER — DIPHENHYDRAMINE HCL 25 MG PO CAPS
50.0000 mg | ORAL_CAPSULE | Freq: Once | ORAL | Status: AC
  Administered 2021-11-11: 50 mg via ORAL
  Filled 2021-11-11: qty 2

## 2021-11-11 NOTE — Progress Notes (Signed)
Hematology/Oncology Progress note [phone] [phone]        REFERRING PROVIDER: [name], MD    ASSESSMENT & PLAN:   Cancer Staging  Multiple myeloma not having achieved remission [facility]) Staging form: Plasma Cell Myeloma and Plasma Cell Disorders, AJCC 8th Edition - Clinical stage from 08/20/2021: RISS Stage II (Beta-2-microglobulin (mg/L): 3.6, Albumin (g/dL): 2.2, ISS: Stage II, High-risk cytogenetics: Absent, LDH: Normal) - Signed by [name], MD on 10/14/2021   Multiple myeloma not having achieved remission ([place]) # IgG lamda multiple myeloma On chemotherapy plan with lenalidomide (25 mg daily on days 1-14), subcutaneous bortezomib (1.3 mg/m2 on days 1, 4, 8, and 11), and oral dexamethasone (20 mg on days 1, 2, 8, 9, 15, and 16). SubQ daratumumab on days 1, 8, and 15 of cycles 1 through 4 and day 1 of post-ASCT consolidation cycles (cycles 5 and 6).   Labs are reviewed and discussed with patient. Proceed with Cycle 4 D15 Dara Continue  Aspirin 75m daily Refer to DCarltonDr.Kang-urged patient to call [facility] to establish appointments.  #Hypoalbuminemia, follow up with nutritionist #Hyperuricemia, continue allopurinol  Metastatic cancer to bone ([facility] PET scan showed multiple osseous lesions.  Previously discussed with neurosurgeon and radiation oncology.  No intervention for now. Awaiting dental clearance for bone strengthening agents- Dentist recommends wisdom teeth extraction.    Encounter for antineoplastic chemotherapy Chemotherapy plan as listed above.    Recommend patient to obtain dental clearance Patient gets dexamethasone 218mon Day 1, 8, 15 along with his treatment. . Recommend him to take oral dexamethasone 20 mg, day 2, 9,16.   No orders of the defined types were placed in this encounter.   Follow up   1 week  All questions were answered. The patient knows to call the clinic with any problems, questions or concerns. No  barriers to learning was detected.  ZhEarlie ServerMD 11/11/2021   CHIEF COMPLAINTS/PURPOSE OF CONSULTATION:  Multiple myeloma  HISTORY OF PRESENTING ILLNESS:  Tanner Minks36 y.o. male presents  for follow up of Multiple myeloma I have reviewed his chart and materials related to his cancer extensively and collaborated history with the patient. Summary of oncologic history is as follows: Oncology History  Multiple myeloma not having achieved remission ([facility] 08/14/2021 Imaging   CT chest abdomen pelvis without contrast Showed numerous lucencies throughout the bone structures, most pronounced throughout the spine and left left iliac bone.  Otherwise no acute findings in the chest abdomen or pelvis.  Sigmoid diverticulosis.   08/19/2021 Initial Diagnosis   Multiple myeloma not having achieved remission, stage II  -08/15/2021, bone marrow biopsy showed plasma cell myeloma involving greater than 80% of the marrow. Cytogenetics Myeloma  FISH   08/20/2021 Cancer Staging   Staging form: Plasma Cell Myeloma and Plasma Cell Disorders, AJCC 8th Edition - Clinical stage from 08/20/2021: RISS Stage II (Beta-2-microglobulin (mg/L): 3.6, Albumin (g/dL): 2.2, ISS: Stage II, High-risk cytogenetics: Absent, LDH: Normal) - Signed by YuEarlie ServerMD on 10/14/2021 Beta 2 microglobulin range (mg/L): 3.5 to 5.49 Albumin range (g/dL): Less than 3.5 Cytogenetics: No abnormalities   08/20/2021 -  Chemotherapy   MYELOMA NEWLY DIAGNOSED TRANSPLANT CANDIDATE DaraVRd (Daratumumab SUBQ) q21d x 6 Cycles (Induction/Consolidation)      09/11/2021 Imaging   PET scan showed  1. Extensive multifocal hypermetabolic disease throughout the axial and appendicular skeleton, with numerous lucent osseous lesions,consistent with patient's known diagnosis of multiple myeloma. 2. No tracer avid lymph node or mass  identified to suggest soft tissue plasmacytoma     INTERVAL HISTORY Tanner [name] is a 36 y.o. male  who has above history reviewed by me today presents for follow up visit for management of multiple myeloma Denies any neuropathy symptoms. Tolerates Revlimid, no diarrhea Tachycardia has improved.  No palpitation, chest pain, shortness of breath. + chronic back pain stable.  + Intermittent abdomen tightness.  No pain or tenderness No nausea vomiting diarrhea.  Denies numbness and tingling He takes potassium supplementation.   MEDICAL HISTORY:  Past Medical History:  Diagnosis Date   Alcohol use    Sciatica    Smoking     SURGICAL HISTORY: Past Surgical History:  Procedure Laterality Date   INGUINAL HERNIA REPAIR      SOCIAL HISTORY: Social History   Socioeconomic History   Marital status: Single    Spouse name: Not on file   Number of children: Not on file   Years of education: Not on file   Highest education level: Not on file  Occupational History   Not on file  Tobacco Use   Smoking status: Former    Packs/day: 0.50    Years: 10.00    Total pack years: 5.00    Types: E-cigarettes, Cigarettes    Quit date: 08/11/2021    Years since quitting: 0.2   Smokeless tobacco: Never  Vaping Use   Vaping Use: Every day  Substance and Sexual Activity   Alcohol use: Not Currently    Alcohol/week: 24.0 standard drinks of alcohol    Types: 24 Cans of beer per week    Comment: quit drinking approx 08/11/2021   Drug use: Never   Sexual activity: Not on file  Other Topics Concern   Not on file  Social History Narrative   Not on file   Social Determinants of Health   Financial Resource Strain: Not on file  Food Insecurity: Not on file  Transportation Needs: Not on file  Physical Activity: Not on file  Stress: Not on file  Social Connections: Not on file  Intimate Partner Violence: Not on file    FAMILY HISTORY: Family History  Problem Relation Age of Onset   Ovarian cancer Mother    Asthma Mother    Alcoholism Father    Throat cancer Maternal Uncle    Heart disease  Maternal Uncle    Lung cancer Maternal Uncle    Lymphoma Maternal Uncle    Lung cancer Paternal Grandmother     ALLERGIES:  has No Known Allergies.  MEDICATIONS:  Current Outpatient Medications  Medication Sig Dispense Refill   acetaminophen-codeine (TYLENOL #3) 300-30 MG tablet Take 1 tablet by mouth every 6 (six) hours as needed for moderate pain. 30 tablet 0   acyclovir (ZOVIRAX) 400 MG tablet Take 1 tablet (400 mg total) by mouth 2 (two) times daily. 60 tablet 5   allopurinol (ZYLOPRIM) 100 MG tablet Take 0.5 tablets (50 mg total) by mouth daily. 30 tablet 2   aspirin EC 81 MG tablet Take 81 mg by mouth daily. Swallow whole.     dexamethasone (DECADRON) 4 MG tablet Take 5 tablets (20 mg total) by mouth See admin instructions. take dexamethasone 20 mg, day 2, 9,16. 60 tablet 0   potassium chloride SA (KLOR-CON M) 20 MEQ tablet Take 1 tablet (20 mEq total) by mouth daily. 30 tablet 0   REVLIMID 25 MG capsule TAKE 1 CAPSULE BY MOUTH ONCE DAILY FOR 14 DAYS ON AND 7 DAYS OFF 14  capsule 0   ondansetron (ZOFRAN) 8 MG tablet Take 1 tablet (8 mg total) by mouth 2 (two) times daily as needed for nausea or vomiting. (Patient not taking: Reported on 10/28/2021) 20 tablet 2   prochlorperazine (COMPAZINE) 10 MG tablet Take 1 tablet (10 mg total) by mouth every 6 (six) hours as needed for nausea or vomiting. (Patient not taking: Reported on 10/28/2021) 30 tablet 2   No current facility-administered medications for this visit.    Review of Systems  Constitutional:  Positive for fatigue. Negative for appetite change, chills, fever and unexpected weight change.  HENT:   Negative for hearing loss and voice change.   Eyes:  Negative for eye problems and icterus.  Respiratory:  Negative for chest tightness, cough and shortness of breath.   Cardiovascular:  Negative for chest pain and leg swelling.  Gastrointestinal:  Negative for abdominal distention and abdominal pain.  Endocrine: Negative for hot  flashes.  Genitourinary:  Negative for difficulty urinating, dysuria and frequency.   Musculoskeletal:  Positive for back pain. Negative for arthralgias.  Skin:  Negative for itching and rash.  Neurological:  Negative for light-headedness and numbness.  Hematological:  Negative for adenopathy. Does not bruise/bleed easily.  Psychiatric/Behavioral:  Negative for confusion.      PHYSICAL EXAMINATION: ECOG PERFORMANCE STATUS: 1 - Symptomatic but completely ambulatory  Vitals:   11/11/21 1419  BP: 125/83  Pulse: 96  Resp: 18  Temp: 98.7 F (37.1 C)   Filed Weights   11/11/21 1419  Weight: 243 lb 11.2 oz (110.5 kg)    Physical Exam Constitutional:      General: He is not in acute distress.    Appearance: He is obese. He is not diaphoretic.  HENT:     Head: Normocephalic and atraumatic.     Mouth/Throat:     Pharynx: No oropharyngeal exudate.  Eyes:     General: No scleral icterus. Cardiovascular:     Rate and Rhythm: Normal rate.     Heart sounds: No murmur heard. Pulmonary:     Effort: Pulmonary effort is normal. No respiratory distress.  Abdominal:     General: There is no distension.     Palpations: Abdomen is soft.  Musculoskeletal:        General: Normal range of motion.     Cervical back: Normal range of motion.  Skin:    General: Skin is warm and dry.     Findings: No erythema.  Neurological:     Mental Status: He is alert and oriented to person, place, and time. Mental status is at baseline.     Cranial Nerves: No cranial nerve deficit.     Motor: No abnormal muscle tone.  Psychiatric:        Mood and Affect: Mood and affect normal.      LABORATORY DATA:  I have reviewed the data as listed    Latest Ref Rng & Units 11/11/2021    2:02 PM 11/04/2021    8:59 AM 10/28/2021   12:46 PM  CBC  WBC 4.0 - 10.5 K/uL 6.8  3.8  4.4   Hemoglobin 13.0 - 17.0 g/dL 10.5  10.2  10.0   Hematocrit 39.0 - 52.0 % 31.9  30.6  31.0   Platelets 150 - 400 K/uL 171  235   370       Latest Ref Rng & Units 11/11/2021    2:02 PM 11/04/2021    8:59 AM 10/28/2021   12:46 PM  CMP  Glucose 70 - 99 mg/dL 95  84  90   BUN 6 - 20 mg/dL _0 Creatinine 0.61 - 1.24 mg/dL 0.90  0.68  0.81   Sodium 135 - 145 mmol/L 137  136  138   Potassium 3.5 - 5.1 mmol/L 3.8  3.8  4.4   Chloride 98 - 111 mmol/L 103  105  106   CO2 22 - 32 mmol/L _1 Calcium 8.9 - 10.3 mg/dL 8.8  9.0  8.8   Total Protein 6.5 - 8.1 g/dL 6.9  6.6  7.0   Total Bilirubin 0.3 - 1.2 mg/dL 1.3  0.8  1.0   Alkaline Phos 38 - 126 U/L 86  93  110   AST 15 - 41 U/L _2 ALT 0 - 44 U/L _3 RADIOGRAPHIC STUDIES: I have personally reviewed the radiological images as listed and agreed with the findings in the report. No results found.

## 2021-11-11 NOTE — Assessment & Plan Note (Addendum)
#  IgG lamda multiple myeloma On chemotherapy plan with lenalidomide (25 mg daily on days 1-14), subcutaneous bortezomib (1.3 mg/m2 on days 1, 4, 8, and 11), and oral dexamethasone (20 mg on days 1, 2, 8, 9, 15, and 16). SubQ daratumumab on days 1, 8, and 15 of cycles 1 through 4 and day 1 of post-ASCT consolidation cycles (cycles 5 and 6).   Labs are reviewed and discussed with patient. Proceed with Cycle 4 D15 Dara Continue  Aspirin 66m daily Refer to DIndianolaDr.Kang-urged patient to call [facility] to establish appointments.  #Hypoalbuminemia, follow up with nutritionist #Hyperuricemia, continue allopurinol

## 2021-11-11 NOTE — Progress Notes (Signed)
Pt here for follow up. Pt reports occasional tightness around stomach.

## 2021-11-11 NOTE — Assessment & Plan Note (Signed)
Chemotherapy plan as listed above 

## 2021-11-11 NOTE — Assessment & Plan Note (Signed)
PET scan showed multiple osseous lesions.  Previously discussed with neurosurgeon and radiation oncology.  No intervention for now. Awaiting dental clearance for bone strengthening agents- Dentist recommends wisdom teeth extraction.

## 2021-11-18 ENCOUNTER — Inpatient Hospital Stay: Payer: Commercial Managed Care - PPO

## 2021-11-18 ENCOUNTER — Encounter: Payer: Self-pay | Admitting: Oncology

## 2021-11-18 ENCOUNTER — Inpatient Hospital Stay (HOSPITAL_BASED_OUTPATIENT_CLINIC_OR_DEPARTMENT_OTHER): Payer: Commercial Managed Care - PPO | Admitting: Oncology

## 2021-11-18 VITALS — BP 111/76 | HR 83 | Temp 97.3°F | Resp 18

## 2021-11-18 VITALS — BP 134/85 | HR 92 | Temp 97.2°F | Wt 237.5 lb

## 2021-11-18 DIAGNOSIS — Z23 Encounter for immunization: Secondary | ICD-10-CM

## 2021-11-18 DIAGNOSIS — H00019 Hordeolum externum unspecified eye, unspecified eyelid: Secondary | ICD-10-CM | POA: Insufficient documentation

## 2021-11-18 DIAGNOSIS — Z5112 Encounter for antineoplastic immunotherapy: Secondary | ICD-10-CM | POA: Diagnosis not present

## 2021-11-18 DIAGNOSIS — C9000 Multiple myeloma not having achieved remission: Secondary | ICD-10-CM

## 2021-11-18 DIAGNOSIS — C7951 Secondary malignant neoplasm of bone: Secondary | ICD-10-CM | POA: Diagnosis not present

## 2021-11-18 DIAGNOSIS — Z5111 Encounter for antineoplastic chemotherapy: Secondary | ICD-10-CM | POA: Diagnosis not present

## 2021-11-18 DIAGNOSIS — H00011 Hordeolum externum right upper eyelid: Secondary | ICD-10-CM | POA: Diagnosis not present

## 2021-11-18 LAB — COMPREHENSIVE METABOLIC PANEL
ALT: 17 U/L (ref 0–44)
AST: 18 U/L (ref 15–41)
Albumin: 4.1 g/dL (ref 3.5–5.0)
Alkaline Phosphatase: 75 U/L (ref 38–126)
Anion gap: 9 (ref 5–15)
BUN: 9 mg/dL (ref 6–20)
CO2: 21 mmol/L — ABNORMAL LOW (ref 22–32)
Calcium: 9.3 mg/dL (ref 8.9–10.3)
Chloride: 103 mmol/L (ref 98–111)
Creatinine, Ser: 0.86 mg/dL (ref 0.61–1.24)
GFR, Estimated: >60 mL/min (ref >60)
Glucose, Bld: 180 mg/dL — ABNORMAL HIGH (ref 70–99)
Potassium: 3.5 mmol/L (ref 3.5–5.1)
Sodium: 133 mmol/L — ABNORMAL LOW (ref 135–145)
Total Bilirubin: 1 mg/dL (ref 0.3–1.2)
Total Protein: 7.2 g/dL (ref 6.5–8.1)

## 2021-11-18 LAB — CBC WITH DIFFERENTIAL/PLATELET
Abs Immature Granulocytes: 0.02 10*3/uL (ref 0.00–0.07)
Basophils Absolute: 0 10*3/uL (ref 0.0–0.1)
Basophils Relative: 0 %
Eosinophils Absolute: 0 10*3/uL (ref 0.0–0.5)
Eosinophils Relative: 0 %
HCT: 31.3 % — ABNORMAL LOW (ref 39.0–52.0)
Hemoglobin: 10.5 g/dL — ABNORMAL LOW (ref 13.0–17.0)
Immature Granulocytes: 0 %
Lymphocytes Relative: 5 %
Lymphs Abs: 0.4 10*3/uL — ABNORMAL LOW (ref 0.7–4.0)
MCH: 29.1 pg (ref 26.0–34.0)
MCHC: 33.5 g/dL (ref 30.0–36.0)
MCV: 86.7 fL (ref 80.0–100.0)
Monocytes Absolute: 1 10*3/uL (ref 0.1–1.0)
Monocytes Relative: 14 %
Neutro Abs: 5.9 10*3/uL (ref 1.7–7.7)
Neutrophils Relative %: 81 %
Platelets: 382 10*3/uL (ref 150–400)
RBC: 3.61 MIL/uL — ABNORMAL LOW (ref 4.22–5.81)
RDW: 14.8 % (ref 11.5–15.5)
WBC: 7.4 10*3/uL (ref 4.0–10.5)
nRBC: 0 % (ref 0.0–0.2)

## 2021-11-18 MED ORDER — DEXAMETHASONE 4 MG PO TABS
20.0000 mg | ORAL_TABLET | Freq: Once | ORAL | Status: AC
  Administered 2021-11-18: 20 mg via ORAL
  Filled 2021-11-18: qty 5

## 2021-11-18 MED ORDER — DARATUMUMAB-HYALURONIDASE-FIHJ 1800-30000 MG-UT/15ML ~~LOC~~ SOLN
1800.0000 mg | Freq: Once | SUBCUTANEOUS | Status: AC
  Administered 2021-11-18: 1800 mg via SUBCUTANEOUS
  Filled 2021-11-18: qty 15

## 2021-11-18 MED ORDER — ACETAMINOPHEN 325 MG PO TABS
650.0000 mg | ORAL_TABLET | Freq: Once | ORAL | Status: AC
  Administered 2021-11-18: 650 mg via ORAL
  Filled 2021-11-18: qty 2

## 2021-11-18 MED ORDER — MONTELUKAST SODIUM 10 MG PO TABS
10.0000 mg | ORAL_TABLET | Freq: Once | ORAL | Status: AC
  Administered 2021-11-18: 10 mg via ORAL
  Filled 2021-11-18: qty 1

## 2021-11-18 MED ORDER — DIPHENHYDRAMINE HCL 25 MG PO CAPS
50.0000 mg | ORAL_CAPSULE | Freq: Once | ORAL | Status: AC
  Administered 2021-11-18: 50 mg via ORAL
  Filled 2021-11-18: qty 2

## 2021-11-18 MED ORDER — INFLUENZA VAC SPLIT QUAD 0.5 ML IM SUSY
0.5000 mL | PREFILLED_SYRINGE | Freq: Once | INTRAMUSCULAR | Status: AC
  Administered 2021-11-18: 0.5 mL via INTRAMUSCULAR
  Filled 2021-11-18: qty 0.5

## 2021-11-18 MED ORDER — BORTEZOMIB CHEMO SQ INJECTION 3.5 MG (2.5MG/ML)
1.3000 mg/m2 | Freq: Once | INTRAMUSCULAR | Status: AC
  Administered 2021-11-18: 3 mg via SUBCUTANEOUS
  Filled 2021-11-18: qty 1.2

## 2021-11-18 NOTE — Assessment & Plan Note (Signed)
PET scan showed multiple osseous lesions.  Previously discussed with neurosurgeon and radiation oncology.  No intervention for now. Awaiting dental clearance for bone strengthening agents- Dentist recommends wisdom teeth extraction.

## 2021-11-18 NOTE — Assessment & Plan Note (Signed)
Recommend prophylactic influenza vaccination.  He agrees.

## 2021-11-18 NOTE — Patient Instructions (Signed)
[facility] AT [place]  Discharge Instructions: Thank you for choosing [facility] to provide your oncology and hematology care.  If you have a lab appointment with the [facility], please go directly to the [facility] and check in at the registration area.  Wear comfortable clothing and clothing appropriate for easy access to any Portacath or PICC line.   We strive to give you quality time with your provider. You may need to reschedule your appointment if you arrive late (15 or more minutes).  Arriving late affects you and other patients whose appointments are after yours.  Also, if you miss three or more appointments without notifying the office, you may be dismissed from the clinic at the provider's discretion.      For prescription refill requests, have your pharmacy contact our office and allow 72 hours for refills to be completed.    Today you received the following chemotherapy and/or immunotherapy agents Velcade and Darzalex Faspro       To help prevent nausea and vomiting after your treatment, we encourage you to take your nausea medication as directed.  BELOW ARE SYMPTOMS THAT SHOULD BE REPORTED IMMEDIATELY: *FEVER GREATER THAN 100.4 F (38 C) OR HIGHER *CHILLS OR SWEATING *NAUSEA AND VOMITING THAT IS NOT CONTROLLED WITH YOUR NAUSEA MEDICATION *UNUSUAL SHORTNESS OF BREATH *UNUSUAL BRUISING OR BLEEDING *URINARY PROBLEMS (pain or burning when urinating, or frequent urination) *BOWEL PROBLEMS (unusual diarrhea, constipation, pain near the anus) TENDERNESS IN MOUTH AND THROAT WITH OR WITHOUT PRESENCE OF ULCERS (sore throat, sores in mouth, or a toothache) UNUSUAL RASH, SWELLING OR PAIN  UNUSUAL VAGINAL DISCHARGE OR ITCHING   Items with * indicate a potential emergency and should be followed up as soon as possible or go to the Emergency Department if any problems should occur.  Please show the CHEMOTHERAPY ALERT CARD or IMMUNOTHERAPY ALERT  CARD at check-in to the Emergency Department and triage nurse.  Should you have questions after your visit or need to cancel or reschedule your appointment, please contact [facility] AT [place]  [phone] and follow the prompts.  Office hours are 8:00 a.m. to 4:30 p.m. Monday - Friday. Please note that voicemails left after 4:00 p.m. may not be returned until the following business day.  We are closed weekends and major holidays. You have access to a nurse at all times for urgent questions. Please call the main number to the clinic [phone] and follow the prompts.  For any non-urgent questions, you may also contact your provider using MyChart. We now offer e-Visits for anyone 58 and older to request care online for non-urgent symptoms. For details visit [email or web address].   Also download the MyChart app! Go to the app store, search "MyChart", open the app, select [facility], and log in with your MyChart username and password.  Masks are optional in the cancer centers. If you would like for your care team to wear a mask while they are taking care of you, please let them know. For doctor visits, patients may have with them one support person who is at least 35 years old. At this time, visitors are not allowed in the infusion area.

## 2021-11-18 NOTE — Assessment & Plan Note (Signed)
Recommend warm compress.  If no significant improvement, recommend patient to seek urgent care/ophthalmologist for further evaluation.

## 2021-11-18 NOTE — Assessment & Plan Note (Signed)
Chemotherapy plan as listed above 

## 2021-11-18 NOTE — Progress Notes (Signed)
Hematology/Oncology Progress note [phone] [phone]        REFERRING PROVIDER: [name], MD    ASSESSMENT & PLAN:   Cancer Staging  Multiple myeloma not having achieved remission [facility]) Staging form: Plasma Cell Myeloma and Plasma Cell Disorders, AJCC 8th Edition - Clinical stage from 08/20/2021: RISS Stage II (Beta-2-microglobulin (mg/L): 3.6, Albumin (g/dL): 2.2, ISS: Stage II, High-risk cytogenetics: Absent, LDH: Normal) - Signed by [name], MD on 10/14/2021   Multiple myeloma not having achieved remission ([place]) # IgG lamda multiple myeloma On chemotherapy plan with lenalidomide (25 mg daily on days 1-14), subcutaneous bortezomib (1.3 mg/m2 on days 1, 4, 8, and 11), and oral dexamethasone (20 mg on days 1, 2, 8, 9, 15, and 16). SubQ daratumumab on days 1, 8, and 15 of cycles 1 through 4 and day 1 of post-ASCT consolidation cycles (cycles 5 and 6).   Labs are reviewed and discussed with patient. Proceed with Cycle D1 Dara/Velcade.  Patient will return to get day 4, day 8 and day 11 Velcade. Continue  Aspirin 19m daily [facility] to call [facility] to establish appointments.  #Hypoalbuminemia, follow up with nutritionist #Hyperuricemia, continue allopurinol  Encounter for antineoplastic chemotherapy Chemotherapy plan as listed above.   Metastatic cancer to bone ([facility] PET scan showed multiple osseous lesions.  Previously discussed with neurosurgeon and radiation oncology.  No intervention for now. Awaiting dental clearance for bone strengthening agents- Dentist recommends wisdom teeth extraction.    Stye Recommend warm compress.  If no significant improvement, recommend patient to seek urgent care/ophthalmologist for further evaluation.  Need for prophylactic vaccination and inoculation against influenza Recommend prophylactic influenza vaccination.  He agrees.   Recommend patient to obtain dental clearance Patient  gets dexamethasone 255mon Day 1, 8, along with his treatment. . Recommend him to take oral dexamethasone 20 mg, day 2, 9,   No orders of the defined types were placed in this encounter.   Follow up  Per LOS.  All questions were answered. The patient knows to call the clinic with any problems, questions or concerns. No barriers to learning was detected.  ZhEarlie ServerMD 11/18/2021   CHIEF COMPLAINTS/PURPOSE OF CONSULTATION:  Multiple myeloma  HISTORY OF PRESENTING ILLNESS:  [name]89.o. male presents  for follow up of Multiple myeloma I have reviewed his chart and materials related to his cancer extensively and collaborated history with the patient. Summary of oncologic history is as follows: Oncology History  [place]   CT chest abdomen pelvis without contrast Showed numerous lucencies throughout the bone structures, most pronounced throughout the spine and left left iliac bone.  Otherwise no acute findings in the chest abdomen or pelvis.  Sigmoid diverticulosis.   08/19/2021 Initial Diagnosis   Multiple myeloma not having achieved remission, stage II  -08/15/2021, bone marrow biopsy showed plasma cell myeloma involving greater than 80% of the marrow. Cytogenetics Myeloma  FISH   08/20/2021 Cancer Staging   Staging form: Plasma Cell Myeloma and Plasma Cell Disorders, AJCC 8th Edition - Clinical stage from 08/20/2021: RISS Stage II (Beta-2-microglobulin (mg/L): 3.6, Albumin (g/dL): 2.2, ISS: Stage II, High-risk cytogenetics: Absent, LDH: Normal) - Signed by YuEarlie ServerMD on 10/14/2021 Beta 2 microglobulin range (mg/L): 3.5 to 5.49 Albumin range (g/dL): Less than 3.5 Cytogenetics: No abnormalities   08/20/2021 -  Chemotherapy   MYELOMA NEWLY DIAGNOSED TRANSPLANT CANDIDATE DaraVRd (Daratumumab SUBQ) q21d x 6  Cycles (Induction/Consolidation)      09/11/2021 Imaging   PET scan showed  1. Extensive  multifocal hypermetabolic disease throughout the axial and appendicular skeleton, with numerous lucent osseous lesions,consistent with patient's known diagnosis of multiple myeloma. 2. No tracer avid lymph node or mass identified to suggest soft tissue plasmacytoma     INTERVAL HISTORY Tanner [name] is a 36 y.o. male who has above history reviewed by me today presents for follow up visit for management of multiple myeloma Denies any neuropathy symptoms. Tolerates Revlimid, no diarrhea + chronic back pain stable.  No nausea vomiting diarrhea.  Denies numbness and tingling He takes potassium supplementation.  Right eyelid swelling. No fever or chills.   MEDICAL HISTORY:  Past Medical History:  Diagnosis Date   Alcohol use    Sciatica    Smoking     SURGICAL HISTORY: Past Surgical History:  Procedure Laterality Date   INGUINAL HERNIA REPAIR      SOCIAL HISTORY: Social History   Socioeconomic History   Marital status: Single    Spouse name: Not on file   Number of children: Not on file   Years of education: Not on file   Highest education level: Not on file  Occupational History   Not on file  Tobacco Use   Smoking status: Former    Packs/day: 0.50    Years: 10.00    Total pack years: 5.00    Types: E-cigarettes, Cigarettes    Quit date: 08/11/2021    Years since quitting: 0.2   Smokeless tobacco: Never  Vaping Use   Vaping Use: Every day  Substance and Sexual Activity   Alcohol use: Not Currently    Alcohol/week: 24.0 standard drinks of alcohol    Types: 24 Cans of beer per week    Comment: quit drinking approx 08/11/2021   Drug use: Never   Sexual activity: Not on file  Other Topics Concern   Not on file  Social History Narrative   Not on file   Social Determinants of Health   Financial Resource Strain: Not on file  Food Insecurity: Not on file  Transportation Needs: Not on file  Physical Activity: Not on file  Stress: Not on file  Social  Connections: Not on file  Intimate Partner Violence: Not on file    FAMILY HISTORY: Family History  Problem Relation Age of Onset   Ovarian cancer Mother    Asthma Mother    Alcoholism Father    Throat cancer Maternal Uncle    Heart disease Maternal Uncle    Lung cancer Maternal Uncle    Lymphoma Maternal Uncle    Lung cancer Paternal Grandmother     ALLERGIES:  has No Known Allergies.  MEDICATIONS:  Current Outpatient Medications  Medication Sig Dispense Refill   acetaminophen-codeine (TYLENOL #3) 300-30 MG tablet Take 1 tablet by mouth every 6 (six) hours as needed for moderate pain. 30 tablet 0   acyclovir (ZOVIRAX) 400 MG tablet Take 1 tablet (400 mg total) by mouth 2 (two) times daily. 60 tablet 5   allopurinol (ZYLOPRIM) 100 MG tablet Take 0.5 tablets (50 mg total) by mouth daily. 30 tablet 2   aspirin EC 81 MG tablet Take 81 mg by mouth daily. Swallow whole.     dexamethasone (DECADRON) 4 MG tablet Take 5 tablets (20 mg total) by mouth See admin instructions. take dexamethasone 20 mg, day 2, 9,16. 60 tablet 0   potassium chloride SA (KLOR-CON M) 20 MEQ tablet  Take 1 tablet (20 mEq total) by mouth daily. 30 tablet 0   REVLIMID 25 MG capsule TAKE 1 CAPSULE BY MOUTH ONCE DAILY FOR 14 DAYS ON AND 7 DAYS OFF 14 capsule 0   ondansetron (ZOFRAN) 8 MG tablet Take 1 tablet (8 mg total) by mouth 2 (two) times daily as needed for nausea or vomiting. (Patient not taking: Reported on 10/28/2021) 20 tablet 2   prochlorperazine (COMPAZINE) 10 MG tablet Take 1 tablet (10 mg total) by mouth every 6 (six) hours as needed for nausea or vomiting. (Patient not taking: Reported on 10/28/2021) 30 tablet 2   No current facility-administered medications for this visit.    Review of Systems  Constitutional:  Positive for fatigue. Negative for appetite change, chills, fever and unexpected weight change.  HENT:   Negative for hearing loss and voice change.   Eyes:  Negative for eye problems and  icterus.       Right eyelid swelling  Respiratory:  Negative for chest tightness, cough and shortness of breath.   Cardiovascular:  Negative for chest pain and leg swelling.  Gastrointestinal:  Negative for abdominal distention and abdominal pain.  Endocrine: Negative for hot flashes.  Genitourinary:  Negative for difficulty urinating, dysuria and frequency.   Musculoskeletal:  Positive for back pain. Negative for arthralgias.  Skin:  Negative for itching and rash.  Neurological:  Negative for light-headedness and numbness.  Hematological:  Negative for adenopathy. Does not bruise/bleed easily.  Psychiatric/Behavioral:  Negative for confusion.      PHYSICAL EXAMINATION: ECOG PERFORMANCE STATUS: 1 - Symptomatic but completely ambulatory  Vitals:   11/18/21 1318  BP: 134/85  Pulse: 92  Temp: (!) 97.2 F (36.2 C)  SpO2: 100%   Filed Weights   11/18/21 1318  Weight: 237 lb 8 oz (107.7 kg)    Physical Exam Constitutional:      General: He is not in acute distress.    Appearance: He is obese. He is not diaphoretic.  HENT:     Head: Normocephalic and atraumatic.     Mouth/Throat:     Pharynx: No oropharyngeal exudate.  Eyes:     General: No scleral icterus. Cardiovascular:     Rate and Rhythm: Normal rate.     Heart sounds: No murmur heard. Pulmonary:     Effort: Pulmonary effort is normal. No respiratory distress.  Abdominal:     General: There is no distension.     Palpations: Abdomen is soft.  Musculoskeletal:        General: Normal range of motion.     Cervical back: Normal range of motion.  Skin:    General: Skin is warm and dry.     Findings: No erythema.  Neurological:     Mental Status: He is alert and oriented to person, place, and time. Mental status is at baseline.     Cranial Nerves: No cranial nerve deficit.     Motor: No abnormal muscle tone.  Psychiatric:        Mood and Affect: Mood and affect normal.      LABORATORY DATA:  I have reviewed the  data as listed    Latest Ref Rng & Units 11/18/2021   11:54 AM 11/11/2021    2:02 PM 11/04/2021    8:59 AM  CBC  WBC 4.0 - 10.5 K/uL 7.4  6.8  3.8   Hemoglobin 13.0 - 17.0 g/dL 10.5  10.5  10.2   Hematocrit 39.0 - 52.0 % 31.3  31.9  30.6   Platelets 150 - 400 K/uL 382  171  235       Latest Ref Rng & Units 11/18/2021   11:54 AM 11/11/2021    2:02 PM 11/04/2021    8:59 AM  CMP  Glucose 70 - 99 mg/dL 180  95  84   BUN 6 - 20 mg/dL _0 Creatinine 0.61 - 1.24 mg/dL 0.86  0.90  0.68   Sodium 135 - 145 mmol/L 133  137  136   Potassium 3.5 - 5.1 mmol/L 3.5  3.8  3.8   Chloride 98 - 111 mmol/L 103  103  105   CO2 22 - 32 mmol/L _1 Calcium 8.9 - 10.3 mg/dL 9.3  8.8  9.0   Total Protein 6.5 - 8.1 g/dL 7.2  6.9  6.6   Total Bilirubin 0.3 - 1.2 mg/dL 1.0  1.3  0.8   Alkaline Phos 38 - 126 U/L 75  86  93   AST 15 - 41 U/L _2 ALT 0 - 44 U/L _3 RADIOGRAPHIC STUDIES: I have personally reviewed the radiological images as listed and agreed with the findings in the report. No results found.

## 2021-11-18 NOTE — Progress Notes (Signed)
Patient is here for follow-up and treatment. He  has been having trouble with his right eye. It started last Tuesday. He thought  it was a stye because has as always gotten them, but it is not healing like it normally does. He thinks that he may have been bitten by something. He states that when he wakes up his eye has a lot of "crusty stuff" around it.

## 2021-11-18 NOTE — Assessment & Plan Note (Signed)
#  IgG lamda multiple myeloma On chemotherapy plan with lenalidomide (25 mg daily on days 1-14), subcutaneous bortezomib (1.3 mg/m2 on days 1, 4, 8, and 11), and oral dexamethasone (20 mg on days 1, 2, 8, 9, 15, and 16). SubQ daratumumab on days 1, 8, and 15 of cycles 1 through 4 and day 1 of post-ASCT consolidation cycles (cycles 5 and 6).   Labs are reviewed and discussed with patient. Proceed with Cycle D1 Dara/Velcade.  Patient will return to get day 4, day 8 and day 11 Velcade. Continue  Aspirin 50m daily [facility]-urged patient to call [facility] to establish appointments.  #Hypoalbuminemia, follow up with nutritionist #Hyperuricemia, continue allopurinol

## 2021-11-19 LAB — KAPPA/LAMBDA LIGHT CHAINS
Kappa free light chain: 3.6 mg/L (ref 3.3–19.4)
Kappa, lambda light chain ratio: 1.09 (ref 0.26–1.65)
Lambda free light chains: 3.3 mg/L — ABNORMAL LOW (ref 5.7–26.3)

## 2021-11-21 ENCOUNTER — Inpatient Hospital Stay (HOSPITAL_BASED_OUTPATIENT_CLINIC_OR_DEPARTMENT_OTHER): Payer: Commercial Managed Care - PPO | Admitting: Nurse Practitioner

## 2021-11-21 ENCOUNTER — Inpatient Hospital Stay: Payer: Commercial Managed Care - PPO

## 2021-11-21 VITALS — BP 143/89 | HR 104 | Temp 97.1°F | Resp 18

## 2021-11-21 DIAGNOSIS — H0100A Unspecified blepharitis right eye, upper and lower eyelids: Secondary | ICD-10-CM

## 2021-11-21 DIAGNOSIS — C9000 Multiple myeloma not having achieved remission: Secondary | ICD-10-CM

## 2021-11-21 DIAGNOSIS — Z5112 Encounter for antineoplastic immunotherapy: Secondary | ICD-10-CM | POA: Diagnosis not present

## 2021-11-21 LAB — MULTIPLE MYELOMA PANEL, SERUM
Albumin SerPl Elph-Mcnc: 4 g/dL (ref 2.9–4.4)
Albumin/Glob SerPl: 1.5 (ref 0.7–1.7)
Alpha 1: 0.3 g/dL (ref 0.0–0.4)
Alpha2 Glob SerPl Elph-Mcnc: 0.6 g/dL (ref 0.4–1.0)
B-Globulin SerPl Elph-Mcnc: 1 g/dL (ref 0.7–1.3)
Gamma Glob SerPl Elph-Mcnc: 0.8 g/dL (ref 0.4–1.8)
Globulin, Total: 2.7 g/dL (ref 2.2–3.9)
IgA: 24 mg/dL — ABNORMAL LOW (ref 90–386)
IgG (Immunoglobin G), Serum: 866 mg/dL (ref 603–1613)
IgM (Immunoglobulin M), Srm: 36 mg/dL (ref 20–172)
M Protein SerPl Elph-Mcnc: 0.5 g/dL — ABNORMAL HIGH
Total Protein ELP: 6.7 g/dL (ref 6.0–8.5)

## 2021-11-21 MED ORDER — BACITRACIN-POLYMYXIN B 500-10000 UNIT/GM OP OINT: 1.0000 | TOPICAL_OINTMENT | Freq: Two times a day (BID) | OPHTHALMIC | 0 refills | Status: AC

## 2021-11-21 MED ORDER — DOXYCYCLINE HYCLATE 100 MG PO TABS: 100.0000 mg | ORAL_TABLET | Freq: Two times a day (BID) | ORAL | 0 refills | Status: AC

## 2021-11-21 MED ORDER — BORTEZOMIB CHEMO SQ INJECTION 3.5 MG (2.5MG/ML)
1.3000 mg/m2 | Freq: Once | INTRAMUSCULAR | Status: AC
  Administered 2021-11-21: 3 mg via SUBCUTANEOUS
  Filled 2021-11-21: qty 1.2

## 2021-11-21 MED ORDER — PROCHLORPERAZINE MALEATE 10 MG PO TABS
10.0000 mg | ORAL_TABLET | Freq: Once | ORAL | Status: AC
  Administered 2021-11-21: 10 mg via ORAL
  Filled 2021-11-21: qty 1

## 2021-11-21 NOTE — Patient Instructions (Signed)
[facility]  Discharge Instructions: Thank you for choosing [facility] to provide your oncology and hematology care.  If you have a lab appointment with the Cancer Center, please go directly to the Cancer Center and check in at the registration area.  Wear comfortable clothing and clothing appropriate for easy access to any Portacath or PICC line.   We strive to give you quality time with your provider. You may need to reschedule your appointment if you arrive late (15 or more minutes).  Arriving late affects you and other patients whose appointments are after yours.  Also, if you miss three or more appointments without notifying the office, you may be dismissed from the clinic at the provider's discretion.      For prescription refill requests, have your pharmacy contact our office and allow 72 hours for refills to be completed.       To help prevent nausea and vomiting after your treatment, we encourage you to take your nausea medication as directed.  BELOW ARE SYMPTOMS THAT SHOULD BE REPORTED IMMEDIATELY: *FEVER GREATER THAN 100.4 F (38 C) OR HIGHER *CHILLS OR SWEATING *NAUSEA AND VOMITING THAT IS NOT CONTROLLED WITH YOUR NAUSEA MEDICATION *UNUSUAL SHORTNESS OF BREATH *UNUSUAL BRUISING OR BLEEDING *URINARY PROBLEMS (pain or burning when urinating, or frequent urination) *BOWEL PROBLEMS (unusual diarrhea, constipation, pain near the anus) TENDERNESS IN MOUTH AND THROAT WITH OR WITHOUT PRESENCE OF ULCERS (sore throat, sores in mouth, or a toothache) UNUSUAL RASH, SWELLING OR PAIN  UNUSUAL VAGINAL DISCHARGE OR ITCHING   Items with * indicate a potential emergency and should be followed up as soon as possible or go to the Emergency Department if any problems should occur.  Please show the CHEMOTHERAPY ALERT CARD or IMMUNOTHERAPY ALERT CARD at check-in to the Emergency Department and triage nurse.  Should you have questions after your  visit or need to cancel or reschedule your appointment, please contact [facility]  [phone] and follow the prompts.  Office hours are 8:00 a.m. to 4:30 p.m. Monday - Friday. Please note that voicemails left after 4:00 p.m. may not be returned until the following business day.  We are closed weekends and major holidays. You have access to a nurse at all times for urgent questions. Please call the main number to the clinic [phone] and follow the prompts.  For any non-urgent questions, you may also contact your provider using MyChart. We now offer e-Visits for anyone 18 and older to request care online for non-urgent symptoms. For details visit [email or web address].   Also download the MyChart app! Go to the app store, search "MyChart", open the app, select [facility], and log in with your MyChart username and password.  Masks are optional in the cancer centers. If you would like for your care team to wear a mask while they are taking care of you, please let them know. For doctor visits, patients may have with them one support person who is at least 35 years old. At this time, visitors are not allowed in the infusion area.   

## 2021-11-21 NOTE — Progress Notes (Signed)
Sympt[facility] Management [place] [facility] [place]. [facility] [address] [place], [place] [address] [phone] (phone) [phone] (fax)  Patient Care Team: Pcp, No as PCP - General   Name of the patient: Tanner [name]  [id number]  1985/09/30   Date of visit: 11/21/21  Diagnosis- Multiple Myeloma  Chief complaint/ Reason for visit- Eye Swelling  Heme/Onc history:  Oncology History  Multiple myeloma not having achieved remission ([place])  08/14/2021 Imaging   CT chest abdomen pelvis without contrast Showed numerous lucencies throughout the bone structures, most pronounced throughout the spine and left left iliac bone.  Otherwise no acute findings in the chest abdomen or pelvis.  Sigmoid diverticulosis.   08/19/2021 Initial Diagnosis   Multiple myeloma not having achieved remission, stage II  -08/15/2021, bone marrow biopsy showed plasma cell myeloma involving greater than 80% of the marrow. Cytogenetics Myeloma  FISH   08/20/2021 Cancer Staging   Staging form: Plasma Cell Myeloma and Plasma Cell Disorders, AJCC 8th Edition - Clinical stage from 08/20/2021: RISS Stage II (Beta-2-microglobulin (mg/L): 3.6, Albumin (g/dL): 2.2, ISS: Stage II, High-risk cytogenetics: Absent, LDH: Normal) - Signed by [name], MD on 10/14/2021 Beta 2 microglobulin range (mg/L): 3.5 to 5.49 Albumin range (g/dL): Less than 3.5 Cytogenetics: No abnormalities   08/20/2021 -  Chemotherapy   MYELOMA NEWLY DIAGNOSED TRANSPLANT CANDIDATE DaraVRd (Daratumumab SUBQ) q21d x 6 Cycles (Induction/Consolidation)      09/11/2021 Imaging   PET scan showed  1. Extensive multifocal hypermetabolic disease throughout the axial and appendicular skeleton, with numerous lucent osseous lesions,consistent with patient's known diagnosis of multiple myeloma. 2. No tracer avid lymph node or mass identified to suggest soft tissue  plasmacytoma     Interval history- Patient is 36 year old male, currently receiving induction chemotherapy for multiple myeloma who requests evaluation of inflammation of right eyelid and possible styes. He complained of symptoms earlier in the week and has been using cold compresses and gentle cleansing but now has worsening symptoms. Has noticed 2 more bumps appear and upper lid is crusting and swollen. No vision changes. Has a history of styes in the past but this is worse. Has not seen an eye doctor.   Review of systems- Review of Systems  Constitutional:  Positive for malaise/fatigue. Negative for chills and fever.  Eyes:  Positive for discharge and redness. Negative for blurred vision, double vision, photophobia and pain.     Current treatment- DaraVRd  No Known Allergies  Past Medical History:  Diagnosis Date   Alcohol use    Sciatica    Smoking     Past Surgical History:  Procedure Laterality Date   INGUINAL HERNIA REPAIR      Social History   Socioeconomic History   Marital status: Single    Spouse name: Not on file   Number of children: Not on file   Years of education: Not on file   Highest education level: Not on file  Occupational History   Not on file  Tobacco Use   Smoking status: Former    Packs/day: 0.50    Years: 10.00    Total pack years: 5.00    Types: E-cigarettes, Cigarettes    Quit date: 08/11/2021    Years since quitting: 0.2   Smokeless tobacco: Never  Vaping Use   Vaping Use: Every day  Substance and Sexual Activity   Alcohol use: Not Currently    Alcohol/week:  24.0 standard drinks of alcohol    Types: 24 Cans of beer per week    Comment: quit drinking approx 08/11/2021   Drug use: Never   Sexual activity: Not on file  Other Topics Concern   Not on file  Social History Narrative   Not on file   Social Determinants of Health   Financial Resource Strain: Not on file  Food Insecurity: Not on file  Transportation Needs: Not on file   Physical Activity: Not on file  Stress: Not on file  Social Connections: Not on file  Intimate Partner Violence: Not on file    Family History  Problem Relation Age of Onset   Ovarian cancer Mother    Asthma Mother    Alcoholism Father    Throat cancer Maternal Uncle    Heart disease Maternal Uncle    Lung cancer Maternal Uncle    Lymphoma Maternal Uncle    Lung cancer Paternal Grandmother      Current Outpatient Medications:    acetaminophen-codeine (TYLENOL #3) 300-30 MG tablet, Take 1 tablet by mouth every 6 (six) hours as needed for moderate pain., Disp: 30 tablet, Rfl: 0   acyclovir (ZOVIRAX) 400 MG tablet, Take 1 tablet (400 mg total) by mouth 2 (two) times daily., Disp: 60 tablet, Rfl: 5   allopurinol (ZYLOPRIM) 100 MG tablet, Take 0.5 tablets (50 mg total) by mouth daily., Disp: 30 tablet, Rfl: 2   aspirin EC 81 MG tablet, Take 81 mg by mouth daily. Swallow whole., Disp: , Rfl:    dexamethasone (DECADRON) 4 MG tablet, Take 5 tablets (20 mg total) by mouth See admin instructions. take dexamethasone 20 mg, day 2, 9,16., Disp: 60 tablet, Rfl: 0   ondansetron (ZOFRAN) 8 MG tablet, Take 1 tablet (8 mg total) by mouth 2 (two) times daily as needed for nausea or vomiting. (Patient not taking: Reported on 10/28/2021), Disp: 20 tablet, Rfl: 2   potassium chloride SA (KLOR-CON M) 20 MEQ tablet, Take 1 tablet (20 mEq total) by mouth daily., Disp: 30 tablet, Rfl: 0   prochlorperazine (COMPAZINE) 10 MG tablet, Take 1 tablet (10 mg total) by mouth every 6 (six) hours as needed for nausea or vomiting. (Patient not taking: Reported on 10/28/2021), Disp: 30 tablet, Rfl: 2   REVLIMID 25 MG capsule, TAKE 1 CAPSULE BY MOUTH ONCE DAILY FOR 14 DAYS ON AND 7 DAYS OFF, Disp: 14 capsule, Rfl: 0  Physical exam: There were no vitals filed for this visit. Physical Exam Constitutional:      Appearance: He is not ill-appearing.  Eyes:     General: No scleral icterus.    Extraocular Movements:  Extraocular movements intact.     Pupils: Pupils are equal, round, and reactive to light.     Comments: Upper eyelid appears inflamed, extending across entirety of upper lid, outer 1/3 > inner. Possible underlying stye. Lower lid, 2 styes are red and painful appearing.   Neurological:     Mental Status: He is alert.       Assessment and plan- Patient is a 36 y.o. male with multiple styes of upper and lower lid of right eye refractory to conservative management. Likely complexity related to him being on active chemotherapy for multiple myeloma.   Start topical antibiotics with bacitracin twice a day x 10 days with concurrent doxycycline 100 mg BID x 10 days. Continue conservative measures including warm compresses for 5-10 minutes 4 times a day. Wash lids with dilute baby shampoo gently to  help remove accumulated debris/drainage then rinse well.   Ok to continue chemo for now. If worsening symptoms or no improvement, may need to hold.   Will also refer to ophthalmology; patient requests referral to [facility].    Visit Diagnosis 1. Blepharitis of both upper and lower eyelid of right eye, unspecified type   2. Multiple myeloma not having achieved remission [facility])     Patient expressed understanding and was in agreement with this plan. He also understands that He can call clinic at any time with any questions, concerns, or complaints.   Thank you for allowing me to participate in the care of this very pleasant patient.   Beckey Rutter, DNP, AGNP-C [facility]  CC: Dr Tasia Catchings

## 2021-11-25 ENCOUNTER — Inpatient Hospital Stay: Payer: Commercial Managed Care - PPO

## 2021-11-25 VITALS — BP 129/95 | HR 110 | Temp 97.1°F | Resp 16

## 2021-11-25 DIAGNOSIS — C9000 Multiple myeloma not having achieved remission: Secondary | ICD-10-CM

## 2021-11-25 DIAGNOSIS — Z5112 Encounter for antineoplastic immunotherapy: Secondary | ICD-10-CM | POA: Diagnosis not present

## 2021-11-25 LAB — COMPREHENSIVE METABOLIC PANEL
ALT: 14 U/L (ref 0–44)
AST: 20 U/L (ref 15–41)
Albumin: 3.9 g/dL (ref 3.5–5.0)
Alkaline Phosphatase: 78 U/L (ref 38–126)
Anion gap: 7 (ref 5–15)
BUN: 7 mg/dL (ref 6–20)
CO2: 23 mmol/L (ref 22–32)
Calcium: 9 mg/dL (ref 8.9–10.3)
Chloride: 106 mmol/L (ref 98–111)
Creatinine, Ser: 0.94 mg/dL (ref 0.61–1.24)
GFR, Estimated: >60 mL/min (ref >60)
Glucose, Bld: 102 mg/dL — ABNORMAL HIGH (ref 70–99)
Potassium: 3.7 mmol/L (ref 3.5–5.1)
Sodium: 136 mmol/L (ref 135–145)
Total Bilirubin: 0.9 mg/dL (ref 0.3–1.2)
Total Protein: 6.9 g/dL (ref 6.5–8.1)

## 2021-11-25 LAB — CBC WITH DIFFERENTIAL/PLATELET
Abs Immature Granulocytes: 0.01 10*3/uL (ref 0.00–0.07)
Basophils Absolute: 0 10*3/uL (ref 0.0–0.1)
Basophils Relative: 1 %
Eosinophils Absolute: 0.1 10*3/uL (ref 0.0–0.5)
Eosinophils Relative: 3 %
HCT: 35.5 % — ABNORMAL LOW (ref 39.0–52.0)
Hemoglobin: 12 g/dL — ABNORMAL LOW (ref 13.0–17.0)
Immature Granulocytes: 0 %
Lymphocytes Relative: 27 %
Lymphs Abs: 1.1 10*3/uL (ref 0.7–4.0)
MCH: 29.2 pg (ref 26.0–34.0)
MCHC: 33.8 g/dL (ref 30.0–36.0)
MCV: 86.4 fL (ref 80.0–100.0)
Monocytes Absolute: 0.5 10*3/uL (ref 0.1–1.0)
Monocytes Relative: 12 %
Neutro Abs: 2.4 10*3/uL (ref 1.7–7.7)
Neutrophils Relative %: 57 %
Platelets: 353 10*3/uL (ref 150–400)
RBC: 4.11 MIL/uL — ABNORMAL LOW (ref 4.22–5.81)
RDW: 14.3 % (ref 11.5–15.5)
WBC: 4.2 10*3/uL (ref 4.0–10.5)
nRBC: 0.5 % — ABNORMAL HIGH (ref 0.0–0.2)

## 2021-11-25 LAB — SAMPLE TO BLOOD BANK

## 2021-11-25 MED ORDER — PROCHLORPERAZINE MALEATE 10 MG PO TABS
10.0000 mg | ORAL_TABLET | Freq: Four times a day (QID) | ORAL | Status: DC | PRN
  Administered 2021-11-25: 10 mg via ORAL
  Filled 2021-11-25: qty 1

## 2021-11-25 MED ORDER — DEXAMETHASONE 4 MG PO TABS
20.0000 mg | ORAL_TABLET | Freq: Once | ORAL | Status: AC
  Administered 2021-11-25: 20 mg via ORAL
  Filled 2021-11-25: qty 5

## 2021-11-25 MED ORDER — BORTEZOMIB CHEMO SQ INJECTION 3.5 MG (2.5MG/ML)
1.3000 mg/m2 | Freq: Once | INTRAMUSCULAR | Status: AC
  Administered 2021-11-25: 3 mg via SUBCUTANEOUS
  Filled 2021-11-25: qty 1.2

## 2021-11-25 NOTE — Patient Instructions (Signed)
[facility]  Discharge Instructions: Thank you for choosing [facility] to provide your oncology and hematology care.  If you have a lab appointment with the Cancer Center, please go directly to the Cancer Center and check in at the registration area.  Wear comfortable clothing and clothing appropriate for easy access to any Portacath or PICC line.   We strive to give you quality time with your provider. You may need to reschedule your appointment if you arrive late (15 or more minutes).  Arriving late affects you and other patients whose appointments are after yours.  Also, if you miss three or more appointments without notifying the office, you may be dismissed from the clinic at the provider's discretion.      For prescription refill requests, have your pharmacy contact our office and allow 72 hours for refills to be completed.    Today you received the following chemotherapy and/or immunotherapy agents Velcade      To help prevent nausea and vomiting after your treatment, we encourage you to take your nausea medication as directed.  BELOW ARE SYMPTOMS THAT SHOULD BE REPORTED IMMEDIATELY: *FEVER GREATER THAN 100.4 F (38 C) OR HIGHER *CHILLS OR SWEATING *NAUSEA AND VOMITING THAT IS NOT CONTROLLED WITH YOUR NAUSEA MEDICATION *UNUSUAL SHORTNESS OF BREATH *UNUSUAL BRUISING OR BLEEDING *URINARY PROBLEMS (pain or burning when urinating, or frequent urination) *BOWEL PROBLEMS (unusual diarrhea, constipation, pain near the anus) TENDERNESS IN MOUTH AND THROAT WITH OR WITHOUT PRESENCE OF ULCERS (sore throat, sores in mouth, or a toothache) UNUSUAL RASH, SWELLING OR PAIN  UNUSUAL VAGINAL DISCHARGE OR ITCHING   Items with * indicate a potential emergency and should be followed up as soon as possible or go to the Emergency Department if any problems should occur.  Please show the CHEMOTHERAPY ALERT CARD or IMMUNOTHERAPY ALERT CARD at check-in to the  Emergency Department and triage nurse.  Should you have questions after your visit or need to cancel or reschedule your appointment, please contact [facility]  [phone] and follow the prompts.  Office hours are 8:00 a.m. to 4:30 p.m. Monday - Friday. Please note that voicemails left after 4:00 p.m. may not be returned until the following business day.  We are closed weekends and major holidays. You have access to a nurse at all times for urgent questions. Please call the main number to the clinic [phone] and follow the prompts.  For any non-urgent questions, you may also contact your provider using MyChart. We now offer e-Visits for anyone 18 and older to request care online for non-urgent symptoms. For details visit [email or web address].   Also download the MyChart app! Go to the app store, search "MyChart", open the app, select [facility], and log in with your MyChart username and password.  Masks are optional in the cancer centers. If you would like for your care team to wear a mask while they are taking care of you, please let them know. For doctor visits, patients may have with them one support person who is at least 35 years old. At this time, visitors are not allowed in the infusion area.   

## 2021-12-01 ENCOUNTER — Inpatient Hospital Stay: Payer: Commercial Managed Care - PPO

## 2021-12-01 VITALS — BP 124/87 | HR 96 | Temp 96.0°F | Resp 17 | Wt 241.0 lb

## 2021-12-01 DIAGNOSIS — C9000 Multiple myeloma not having achieved remission: Secondary | ICD-10-CM

## 2021-12-01 DIAGNOSIS — Z5112 Encounter for antineoplastic immunotherapy: Secondary | ICD-10-CM | POA: Diagnosis not present

## 2021-12-01 MED ORDER — BORTEZOMIB CHEMO SQ INJECTION 3.5 MG (2.5MG/ML)
1.3000 mg/m2 | Freq: Once | INTRAMUSCULAR | Status: AC
  Administered 2021-12-01: 3 mg via SUBCUTANEOUS
  Filled 2021-12-01: qty 1.2

## 2021-12-01 MED ORDER — PROCHLORPERAZINE MALEATE 10 MG PO TABS
10.0000 mg | ORAL_TABLET | Freq: Once | ORAL | Status: AC
  Administered 2021-12-01: 10 mg via ORAL
  Filled 2021-12-01: qty 1

## 2021-12-01 NOTE — Patient Instructions (Signed)
[facility]  Discharge Instructions: Thank you for choosing [facility] to provide your oncology and hematology care.  If you have a lab appointment with the Cancer Center, please go directly to the Cancer Center and check in at the registration area.  Wear comfortable clothing and clothing appropriate for easy access to any Portacath or PICC line.   We strive to give you quality time with your provider. You may need to reschedule your appointment if you arrive late (15 or more minutes).  Arriving late affects you and other patients whose appointments are after yours.  Also, if you miss three or more appointments without notifying the office, you may be dismissed from the clinic at the provider's discretion.      For prescription refill requests, have your pharmacy contact our office and allow 72 hours for refills to be completed.    Today you received the following chemotherapy and/or immunotherapy agents Velcade      To help prevent nausea and vomiting after your treatment, we encourage you to take your nausea medication as directed.  BELOW ARE SYMPTOMS THAT SHOULD BE REPORTED IMMEDIATELY: *FEVER GREATER THAN 100.4 F (38 C) OR HIGHER *CHILLS OR SWEATING *NAUSEA AND VOMITING THAT IS NOT CONTROLLED WITH YOUR NAUSEA MEDICATION *UNUSUAL SHORTNESS OF BREATH *UNUSUAL BRUISING OR BLEEDING *URINARY PROBLEMS (pain or burning when urinating, or frequent urination) *BOWEL PROBLEMS (unusual diarrhea, constipation, pain near the anus) TENDERNESS IN MOUTH AND THROAT WITH OR WITHOUT PRESENCE OF ULCERS (sore throat, sores in mouth, or a toothache) UNUSUAL RASH, SWELLING OR PAIN  UNUSUAL VAGINAL DISCHARGE OR ITCHING   Items with * indicate a potential emergency and should be followed up as soon as possible or go to the Emergency Department if any problems should occur.  Please show the CHEMOTHERAPY ALERT CARD or IMMUNOTHERAPY ALERT CARD at check-in to the  Emergency Department and triage nurse.  Should you have questions after your visit or need to cancel or reschedule your appointment, please contact [facility]  [phone] and follow the prompts.  Office hours are 8:00 a.m. to 4:30 p.m. Monday - Friday. Please note that voicemails left after 4:00 p.m. may not be returned until the following business day.  We are closed weekends and major holidays. You have access to a nurse at all times for urgent questions. Please call the main number to the clinic [phone] and follow the prompts.  For any non-urgent questions, you may also contact your provider using MyChart. We now offer e-Visits for anyone 18 and older to request care online for non-urgent symptoms. For details visit [email or web address].   Also download the MyChart app! Go to the app store, search "MyChart", open the app, select [facility], and log in with your MyChart username and password.  Masks are optional in the cancer centers. If you would like for your care team to wear a mask while they are taking care of you, please let them know. For doctor visits, patients may have with them one support person who is at least 35 years old. At this time, visitors are not allowed in the infusion area.   

## 2021-12-02 ENCOUNTER — Other Ambulatory Visit: Payer: Self-pay | Admitting: Oncology

## 2021-12-02 NOTE — Telephone Encounter (Signed)
  Component Ref Range & Units 7 d ago (11/25/21) 2 wk ago (11/18/21) 3 wk ago (11/11/21) 4 wk ago (11/04/21) 1 mo ago (10/28/21) 1 mo ago (10/21/21) 1 mo ago (10/14/21)            Potassium 3.5 - 5.1 mmol/L 3.7 3.5 3.8 3.8 4.4 2.8 Low  3.3 Low

## 2021-12-09 ENCOUNTER — Inpatient Hospital Stay: Payer: Commercial Managed Care - PPO

## 2021-12-09 ENCOUNTER — Encounter: Payer: Self-pay | Admitting: Oncology

## 2021-12-09 ENCOUNTER — Inpatient Hospital Stay (HOSPITAL_BASED_OUTPATIENT_CLINIC_OR_DEPARTMENT_OTHER): Payer: Commercial Managed Care - PPO | Admitting: Oncology

## 2021-12-09 ENCOUNTER — Inpatient Hospital Stay: Payer: Commercial Managed Care - PPO | Attending: Oncology

## 2021-12-09 ENCOUNTER — Inpatient Hospital Stay: Payer: Commercial Managed Care - PPO | Admitting: Oncology

## 2021-12-09 VITALS — BP 139/106 | HR 100 | Temp 97.4°F | Wt 240.1 lb

## 2021-12-09 DIAGNOSIS — C9000 Multiple myeloma not having achieved remission: Secondary | ICD-10-CM | POA: Diagnosis present

## 2021-12-09 DIAGNOSIS — Z5112 Encounter for antineoplastic immunotherapy: Secondary | ICD-10-CM | POA: Insufficient documentation

## 2021-12-09 DIAGNOSIS — M545 Low back pain, unspecified: Secondary | ICD-10-CM

## 2021-12-09 DIAGNOSIS — C7951 Secondary malignant neoplasm of bone: Secondary | ICD-10-CM

## 2021-12-09 DIAGNOSIS — Z5111 Encounter for antineoplastic chemotherapy: Secondary | ICD-10-CM

## 2021-12-09 LAB — COMPREHENSIVE METABOLIC PANEL
ALT: 12 U/L (ref 0–44)
AST: 22 U/L (ref 15–41)
Albumin: 4 g/dL (ref 3.5–5.0)
Alkaline Phosphatase: 80 U/L (ref 38–126)
Anion gap: 12 (ref 5–15)
BUN: 7 mg/dL (ref 6–20)
CO2: 23 mmol/L (ref 22–32)
Calcium: 9 mg/dL (ref 8.9–10.3)
Chloride: 103 mmol/L (ref 98–111)
Creatinine, Ser: 0.91 mg/dL (ref 0.61–1.24)
GFR, Estimated: >60 mL/min (ref >60)
Glucose, Bld: 128 mg/dL — ABNORMAL HIGH (ref 70–99)
Potassium: 3.5 mmol/L (ref 3.5–5.1)
Sodium: 138 mmol/L (ref 135–145)
Total Bilirubin: 0.9 mg/dL (ref 0.3–1.2)
Total Protein: 6.8 g/dL (ref 6.5–8.1)

## 2021-12-09 LAB — CBC WITH DIFFERENTIAL/PLATELET
Abs Immature Granulocytes: 0.01 10*3/uL (ref 0.00–0.07)
Basophils Absolute: 0 10*3/uL (ref 0.0–0.1)
Basophils Relative: 0 %
Eosinophils Absolute: 0.1 10*3/uL (ref 0.0–0.5)
Eosinophils Relative: 2 %
HCT: 37.2 % — ABNORMAL LOW (ref 39.0–52.0)
Hemoglobin: 12.4 g/dL — ABNORMAL LOW (ref 13.0–17.0)
Immature Granulocytes: 0 %
Lymphocytes Relative: 19 %
Lymphs Abs: 0.9 10*3/uL (ref 0.7–4.0)
MCH: 27.9 pg (ref 26.0–34.0)
MCHC: 33.3 g/dL (ref 30.0–36.0)
MCV: 83.6 fL (ref 80.0–100.0)
Monocytes Absolute: 0.5 10*3/uL (ref 0.1–1.0)
Monocytes Relative: 10 %
Neutro Abs: 3.1 10*3/uL (ref 1.7–7.7)
Neutrophils Relative %: 69 %
Platelets: 278 10*3/uL (ref 150–400)
RBC: 4.45 MIL/uL (ref 4.22–5.81)
RDW: 13.6 % (ref 11.5–15.5)
WBC: 4.6 10*3/uL (ref 4.0–10.5)
nRBC: 0 % (ref 0.0–0.2)

## 2021-12-09 LAB — SAMPLE TO BLOOD BANK

## 2021-12-09 MED ORDER — MONTELUKAST SODIUM 10 MG PO TABS
10.0000 mg | ORAL_TABLET | Freq: Once | ORAL | Status: AC
  Administered 2021-12-09: 10 mg via ORAL
  Filled 2021-12-09: qty 1

## 2021-12-09 MED ORDER — REVLIMID 25 MG PO CAPS: ORAL_CAPSULE | ORAL | 0 refills | Status: DC

## 2021-12-09 MED ORDER — DEXAMETHASONE 4 MG PO TABS
20.0000 mg | ORAL_TABLET | Freq: Once | ORAL | Status: AC
  Administered 2021-12-09: 20 mg via ORAL
  Filled 2021-12-09: qty 5

## 2021-12-09 MED ORDER — BORTEZOMIB CHEMO SQ INJECTION 3.5 MG (2.5MG/ML)
1.3000 mg/m2 | Freq: Once | INTRAMUSCULAR | Status: AC
  Administered 2021-12-09: 3 mg via SUBCUTANEOUS
  Filled 2021-12-09: qty 1.2

## 2021-12-09 MED ORDER — ACETAMINOPHEN 325 MG PO TABS
650.0000 mg | ORAL_TABLET | Freq: Once | ORAL | Status: AC
  Administered 2021-12-09: 650 mg via ORAL
  Filled 2021-12-09: qty 2

## 2021-12-09 MED ORDER — DIPHENHYDRAMINE HCL 25 MG PO CAPS
50.0000 mg | ORAL_CAPSULE | Freq: Once | ORAL | Status: AC
  Administered 2021-12-09: 50 mg via ORAL
  Filled 2021-12-09: qty 2

## 2021-12-09 MED ORDER — DARATUMUMAB-HYALURONIDASE-FIHJ 1800-30000 MG-UT/15ML ~~LOC~~ SOLN
1800.0000 mg | Freq: Once | SUBCUTANEOUS | Status: AC
  Administered 2021-12-09: 1800 mg via SUBCUTANEOUS
  Filled 2021-12-09: qty 15

## 2021-12-09 NOTE — Assessment & Plan Note (Addendum)
#  IgG lamda multiple myeloma On chemotherapy plan with lenalidomide (25 mg daily on days 1-14), subcutaneous bortezomib (1.3 mg/m2 on days 1, 4, 8, and 11), and oral dexamethasone (20 mg on days 1, 2, 8, 9, 15, and 16). SubQ daratumumab on days 1, 8, and 15 of cycles 1 through 4 and day 1 of post-ASCT consolidation cycles (cycles 5 and 6).   Labs are reviewed and discussed with patient. Partial response. M protein 0.5, today's level is pending.  Proceed with Cycle 6  D1 Dara/Velcade.  Patient will return to get day 4, day 8 and day 11 Velcade. Continue  [facility]in [facility]  #Hypoalbuminemia, follow up with nutritionist #Hyperuricemia, continue allopurinol

## 2021-12-09 NOTE — Progress Notes (Signed)
Hematology/Oncology Progress note [phone] [phone]        REFERRING PROVIDER: [name], MD    ASSESSMENT & PLAN:   Cancer Staging  Multiple myeloma not having achieved remission [facility]) Staging form: Plasma Cell Myeloma and Plasma Cell Disorders, AJCC 8th Edition - Clinical stage from 08/20/2021: RISS Stage II (Beta-2-microglobulin (mg/L): 3.6, Albumin (g/dL): 2.2, ISS: Stage II, High-risk cytogenetics: Absent, LDH: Normal) - Signed by Tanner Server, MD on 10/14/2021   Multiple myeloma not having achieved remission ([place]) # IgG lamda multiple myeloma On chemotherapy plan with lenalidomide (25 mg daily on days 1-14), subcutaneous bortezomib (1.3 mg/m2 on days 1, 4, 8, and 11), and oral dexamethasone (20 mg on days 1, 2, 8, 9, 15, and 16). SubQ daratumumab on days 1, 8, and 15 of cycles 1 through 4 and day 1 of post-ASCT consolidation cycles (cycles 5 and 6).   Labs are reviewed and discussed with patient. Partial response. M protein 0.5, today's level is pending.  Proceed with Cycle 6  D1 Dara/Velcade.  Patient will return to get day 4, day 8 and day 11 Velcade. Continue  Aspirin 60m daily [facility]  #Hypoalbuminemia, follow up with nutritionist #Hyperuricemia, continue allopurinol  Encounter for antineoplastic chemotherapy Chemotherapy plan as listed above.   Metastatic cancer to bone ([facility] PET scan showed multiple osseous lesions.  Previously discussed with neurosurgeon and radiation oncology.  No intervention for now. Awaiting dental clearance for bone strengthening agents- Dentist recommends wisdom teeth extraction.  - new onset of lower back pain and extremity weakness. I will check MRI lumbar for evaluation.    Recommend patient to obtain dental clearance Patient gets dexamethasone 225mon Day 1, 8, along with his treatment. . Recommend him to take oral dexamethasone 20 mg, day 2, 9,   Orders Placed This Encounter   Procedures   MR Lumbar Spine W Wo Contrast    Standing Status:   Future    Standing Expiration Date:   12/09/2022    Order Specific Question:   If indicated for the ordered procedure, I authorize the administration of contrast media per Radiology protocol    Answer:   Yes    Order Specific Question:   What is the patient's sedation requirement?    Answer:   No Sedation    Order Specific Question:   Does the patient have a pacemaker or implanted devices?    Answer:   No    Order Specific Question:   Use SRS Protocol?    Answer:   No    Order Specific Question:   [facility]    [facility]     Follow up  Per LOS.  All questions were answered. The patient knows to call the clinic with any problems, questions or concerns. No barriers to learning was detected.  ZhEarlie ServerMD 12/09/2021   CHIEF COMPLAINTS/PURPOSE OF CONSULTATION:  Multiple myeloma  HISTORY OF PRESENTING ILLNESS:  Tanner Andujo89.o. male presents  for follow up of Multiple myeloma I have reviewed his chart and materials related to his cancer extensively and collaborated history with the patient. Summary of oncologic history is as follows: Oncology History  Multiple myeloma not having achieved remission ([facility] 08/14/2021 Imaging   CT chest abdomen pelvis without contrast Showed numerous lucencies throughout the bone structures, most pronounced throughout the spine and left left iliac bone.  Otherwise no acute findings in the  chest abdomen or pelvis.  Sigmoid diverticulosis.   08/19/2021 Initial Diagnosis   Multiple myeloma not having achieved remission, stage II  -08/15/2021, bone marrow biopsy showed plasma cell myeloma involving greater than 80% of the marrow. Cytogenetics Myeloma  FISH   08/20/2021 Cancer Staging   Staging form: Plasma Cell Myeloma and Plasma Cell Disorders, AJCC 8th Edition - Clinical stage from 08/20/2021: RISS Stage II  (Beta-2-microglobulin (mg/L): 3.6, Albumin (g/dL): 2.2, ISS: Stage II, High-risk cytogenetics: Absent, LDH: Normal) - Signed by Tanner Server, MD on 10/14/2021 Beta 2 microglobulin range (mg/L): 3.5 to 5.49 Albumin range (g/dL): Less than 3.5 Cytogenetics: No abnormalities   08/20/2021 -  Chemotherapy   MYELOMA NEWLY DIAGNOSED TRANSPLANT CANDIDATE DaraVRd (Daratumumab SUBQ) q21d x 6 Cycles (Induction/Consolidation)      09/11/2021 Imaging   PET scan showed  1. Extensive multifocal hypermetabolic disease throughout the axial and appendicular skeleton, with numerous lucent osseous lesions,consistent with patient's known diagnosis of multiple myeloma. 2. No tracer avid lymph node or mass identified to suggest soft tissue plasmacytoma     INTERVAL HISTORY Tanner [name] is a 36 y.o. male who has above history reviewed by me today presents for follow up visit for management of multiple myeloma Denies any neuropathy symptoms. Tolerates Revlimid, no diarrhea + chronic back pain has gotten worse recently. He has also noticed numbness tingling of bilateral posterior thigh after sitting on the toilet for 30 minutes a week ago.  After that episode, he noticed intermittent numbness tingling even after sitting for 5 minutes on toilets.  Some weakness as well. No nausea vomiting diarrhea.  He has intermittent fingertip and toe numbness tingling. He takes potassium supplementation, he ran out of 2 days.  He is going to pick up his refill today.   MEDICAL HISTORY:  Past Medical History:  Diagnosis Date   Alcohol use    Sciatica    Smoking     SURGICAL HISTORY: Past Surgical History:  Procedure Laterality Date   INGUINAL HERNIA REPAIR      SOCIAL HISTORY: Social History   Socioeconomic History   Marital status: Single    Spouse name: Not on file   Number of children: Not on file   Years of education: Not on file   Highest education level: Not on file  Occupational History   Not on  file  Tobacco Use   Smoking status: Former    Packs/day: 0.50    Years: 10.00    Total pack years: 5.00    Types: E-cigarettes, Cigarettes    Quit date: 08/11/2021    Years since quitting: 0.3   Smokeless tobacco: Never  Vaping Use   Vaping Use: Every day  Substance and Sexual Activity   Alcohol use: Not Currently    Alcohol/week: 24.0 standard drinks of alcohol    Types: 24 Cans of beer per week    Comment: quit drinking approx 08/11/2021   Drug use: Never   Sexual activity: Not on file  Other Topics Concern   Not on file  Social History Narrative   Not on file   Social Determinants of Health   Financial Resource Strain: Not on file  Food Insecurity: Not on file  Transportation Needs: Not on file  Physical Activity: Not on file  Stress: Not on file  Social Connections: Not on file  Intimate Partner Violence: Not on file    FAMILY HISTORY: Family History  Problem Relation Age of Onset   Ovarian cancer Mother  Asthma Mother    Alcoholism Father    Throat cancer Maternal Uncle    Heart disease Maternal Uncle    Lung cancer Maternal Uncle    Lymphoma Maternal Uncle    Lung cancer Paternal Grandmother     ALLERGIES:  has No Known Allergies.  MEDICATIONS:  Current Outpatient Medications  Medication Sig Dispense Refill   acetaminophen-codeine (TYLENOL #3) 300-30 MG tablet Take 1 tablet by mouth every 6 (six) hours as needed for moderate pain. 30 tablet 0   acyclovir (ZOVIRAX) 400 MG tablet Take 1 tablet (400 mg total) by mouth 2 (two) times daily. 60 tablet 5   allopurinol (ZYLOPRIM) 100 MG tablet Take 0.5 tablets (50 mg total) by mouth daily. 30 tablet 2   aspirin EC 81 MG tablet Take 81 mg by mouth daily. Swallow whole.     dexamethasone (DECADRON) 4 MG tablet Take 5 tablets (20 mg total) by mouth See admin instructions. take dexamethasone 20 mg, day 2, 9,16. 60 tablet 0   KLOR-CON M20 20 MEQ tablet TAKE 1 TABLET BY MOUTH EVERY DAY 30 tablet 0   REVLIMID 25 MG  capsule TAKE 1 CAPSULE BY MOUTH ONCE DAILY FOR 14 DAYS ON AND 7 DAYS OFF 14 capsule 0   ondansetron (ZOFRAN) 8 MG tablet Take 1 tablet (8 mg total) by mouth 2 (two) times daily as needed for nausea or vomiting. (Patient not taking: Reported on 10/28/2021) 20 tablet 2   prochlorperazine (COMPAZINE) 10 MG tablet Take 1 tablet (10 mg total) by mouth every 6 (six) hours as needed for nausea or vomiting. (Patient not taking: Reported on 10/28/2021) 30 tablet 2   No current facility-administered medications for this visit.    Review of Systems  Constitutional:  Positive for fatigue. Negative for appetite change, chills, fever and unexpected weight change.  HENT:   Negative for hearing loss and voice change.   Eyes:  Negative for eye problems and icterus.  Respiratory:  Negative for chest tightness, cough and shortness of breath.   Cardiovascular:  Negative for chest pain and leg swelling.  Gastrointestinal:  Negative for abdominal distention and abdominal pain.  Endocrine: Negative for hot flashes.  Genitourinary:  Negative for difficulty urinating, dysuria and frequency.   Musculoskeletal:  Positive for back pain. Negative for arthralgias.  Skin:  Negative for itching and rash.  Neurological:  Negative for light-headedness and numbness.  Hematological:  Negative for adenopathy. Does not bruise/bleed easily.  Psychiatric/Behavioral:  Negative for confusion.      PHYSICAL EXAMINATION: ECOG PERFORMANCE STATUS: 1 - Symptomatic but completely ambulatory  Vitals:   12/09/21 0904 12/09/21 0929  BP: (!) 139/106   Pulse: (!) 112 100  Temp: (!) 97.4 F (36.3 C)   SpO2: 100%    Filed Weights   12/09/21 0904  Weight: 240 lb 1.6 oz (108.9 kg)    Physical Exam Constitutional:      General: He is not in acute distress.    Appearance: He is obese. He is not diaphoretic.  HENT:     Head: Normocephalic and atraumatic.     Mouth/Throat:     Pharynx: No oropharyngeal exudate.  Eyes:      General: No scleral icterus. Cardiovascular:     Rate and Rhythm: Normal rate.     Heart sounds: No murmur heard. Pulmonary:     Effort: Pulmonary effort is normal. No respiratory distress.  Abdominal:     General: There is no distension.     Palpations:  Abdomen is soft.  Musculoskeletal:        General: Normal range of motion.     Cervical back: Normal range of motion.  Skin:    General: Skin is warm and dry.     Findings: No erythema.  Neurological:     Mental Status: He is alert and oriented to person, place, and time. Mental status is at baseline.     Cranial Nerves: No cranial nerve deficit.     Motor: No abnormal muscle tone.  Psychiatric:        Mood and Affect: Mood and affect normal.      LABORATORY DATA:  I have reviewed the data as listed    Latest Ref Rng & Units 12/09/2021    8:35 AM 11/25/2021   11:24 AM 11/18/2021   11:54 AM  CBC  WBC 4.0 - 10.5 K/uL 4.6  4.2  7.4   Hemoglobin 13.0 - 17.0 g/dL 12.4  12.0  10.5   Hematocrit 39.0 - 52.0 % 37.2  35.5  31.3   Platelets 150 - 400 K/uL 278  353  382       Latest Ref Rng & Units 12/09/2021    8:35 AM 11/25/2021   11:24 AM 11/18/2021   11:54 AM  CMP  Glucose 70 - 99 mg/dL 128  102  180   BUN 6 - 20 mg/dL _0 Creatinine 0.61 - 1.24 mg/dL 0.91  0.94  0.86   Sodium 135 - 145 mmol/L 138  136  133   Potassium 3.5 - 5.1 mmol/L 3.5  3.7  3.5   Chloride 98 - 111 mmol/L 103  106  103   CO2 22 - 32 mmol/L _1 Calcium 8.9 - 10.3 mg/dL 9.0  9.0  9.3   Total Protein 6.5 - 8.1 g/dL 6.8  6.9  7.2   Total Bilirubin 0.3 - 1.2 mg/dL 0.9  0.9  1.0   Alkaline Phos 38 - 126 U/L 80  78  75   AST 15 - 41 U/L _2 ALT 0 - 44 U/L _3 RADIOGRAPHIC STUDIES: I have personally reviewed the radiological images as listed and agreed with the findings in the report. No results found.

## 2021-12-09 NOTE — Assessment & Plan Note (Signed)
Chemotherapy plan as listed above 

## 2021-12-09 NOTE — Assessment & Plan Note (Addendum)
PET scan showed multiple osseous lesions.  Previously discussed with neurosurgeon and radiation oncology.  No intervention for now. Awaiting dental clearance for bone strengthening agents- Dentist recommends wisdom teeth extraction.  - new onset of lower back pain and extremity weakness. I will check MRI lumbar for evaluation.

## 2021-12-09 NOTE — Progress Notes (Signed)
Patrient states that recently he has been having some leg weakness and numbness when standing up. He states that this is especially true when he as been sitting on the toilet. He also states that he has been having some new lower back pain. This has been going on for about 7-8 days.

## 2021-12-10 LAB — KAPPA/LAMBDA LIGHT CHAINS
Kappa free light chain: 6 mg/L (ref 3.3–19.4)
Kappa, lambda light chain ratio: 1.46 (ref 0.26–1.65)
Lambda free light chains: 4.1 mg/L — ABNORMAL LOW (ref 5.7–26.3)

## 2021-12-12 ENCOUNTER — Inpatient Hospital Stay: Payer: Commercial Managed Care - PPO

## 2021-12-12 ENCOUNTER — Telehealth: Payer: Self-pay

## 2021-12-12 VITALS — BP 136/91 | HR 103 | Temp 97.5°F | Resp 18

## 2021-12-12 DIAGNOSIS — Z5112 Encounter for antineoplastic immunotherapy: Secondary | ICD-10-CM | POA: Diagnosis not present

## 2021-12-12 DIAGNOSIS — C9000 Multiple myeloma not having achieved remission: Secondary | ICD-10-CM

## 2021-12-12 MED ORDER — PROCHLORPERAZINE MALEATE 10 MG PO TABS
10.0000 mg | ORAL_TABLET | Freq: Once | ORAL | Status: AC
  Administered 2021-12-12: 10 mg via ORAL
  Filled 2021-12-12: qty 1

## 2021-12-12 MED ORDER — BORTEZOMIB CHEMO SQ INJECTION 3.5 MG (2.5MG/ML)
1.3000 mg/m2 | Freq: Once | INTRAMUSCULAR | Status: AC
  Administered 2021-12-12: 3 mg via SUBCUTANEOUS
  Filled 2021-12-12: qty 1.2

## 2021-12-12 NOTE — Telephone Encounter (Signed)
Spoke to rep at [facility] regarding referral for recurrent Stye. She stated no referral was received despite it being documented in system. Urgent Apt scheduled 12/11 750a, patient aware verbalizes understanding.

## 2021-12-16 ENCOUNTER — Inpatient Hospital Stay: Payer: Commercial Managed Care - PPO

## 2021-12-16 VITALS — BP 126/88 | HR 104 | Temp 97.6°F | Resp 18 | Ht 72.0 in | Wt 240.6 lb

## 2021-12-16 DIAGNOSIS — C9000 Multiple myeloma not having achieved remission: Secondary | ICD-10-CM

## 2021-12-16 DIAGNOSIS — Z5112 Encounter for antineoplastic immunotherapy: Secondary | ICD-10-CM | POA: Diagnosis not present

## 2021-12-16 LAB — CBC WITH DIFFERENTIAL/PLATELET
Abs Immature Granulocytes: 0.01 10*3/uL (ref 0.00–0.07)
Basophils Absolute: 0 10*3/uL (ref 0.0–0.1)
Basophils Relative: 0 %
Eosinophils Absolute: 0.1 10*3/uL (ref 0.0–0.5)
Eosinophils Relative: 2 %
HCT: 37.3 % — ABNORMAL LOW (ref 39.0–52.0)
Hemoglobin: 12.7 g/dL — ABNORMAL LOW (ref 13.0–17.0)
Immature Granulocytes: 0 %
Lymphocytes Relative: 25 %
Lymphs Abs: 1 10*3/uL (ref 0.7–4.0)
MCH: 27.8 pg (ref 26.0–34.0)
MCHC: 34 g/dL (ref 30.0–36.0)
MCV: 81.6 fL (ref 80.0–100.0)
Monocytes Absolute: 0.6 10*3/uL (ref 0.1–1.0)
Monocytes Relative: 17 %
Neutro Abs: 2.2 10*3/uL (ref 1.7–7.7)
Neutrophils Relative %: 56 %
Platelets: 208 10*3/uL (ref 150–400)
RBC: 4.57 MIL/uL (ref 4.22–5.81)
RDW: 13.5 % (ref 11.5–15.5)
WBC: 3.9 10*3/uL — ABNORMAL LOW (ref 4.0–10.5)
nRBC: 0 % (ref 0.0–0.2)

## 2021-12-16 LAB — COMPREHENSIVE METABOLIC PANEL
ALT: 10 U/L (ref 0–44)
AST: 14 U/L — ABNORMAL LOW (ref 15–41)
Albumin: 4.1 g/dL (ref 3.5–5.0)
Alkaline Phosphatase: 74 U/L (ref 38–126)
Anion gap: 10 (ref 5–15)
BUN: 10 mg/dL (ref 6–20)
CO2: 24 mmol/L (ref 22–32)
Calcium: 9.2 mg/dL (ref 8.9–10.3)
Chloride: 104 mmol/L (ref 98–111)
Creatinine, Ser: 0.86 mg/dL (ref 0.61–1.24)
GFR, Estimated: >60 mL/min (ref >60)
Glucose, Bld: 97 mg/dL (ref 70–99)
Potassium: 3.7 mmol/L (ref 3.5–5.1)
Sodium: 138 mmol/L (ref 135–145)
Total Bilirubin: 0.9 mg/dL (ref 0.3–1.2)
Total Protein: 7.2 g/dL (ref 6.5–8.1)

## 2021-12-16 LAB — MULTIPLE MYELOMA PANEL, SERUM
Albumin SerPl Elph-Mcnc: 3.9 g/dL (ref 2.9–4.4)
Albumin/Glob SerPl: 1.6 (ref 0.7–1.7)
Alpha 1: 0.2 g/dL (ref 0.0–0.4)
Alpha2 Glob SerPl Elph-Mcnc: 0.6 g/dL (ref 0.4–1.0)
B-Globulin SerPl Elph-Mcnc: 1 g/dL (ref 0.7–1.3)
Gamma Glob SerPl Elph-Mcnc: 0.6 g/dL (ref 0.4–1.8)
Globulin, Total: 2.5 g/dL (ref 2.2–3.9)
IgA: 38 mg/dL — ABNORMAL LOW (ref 90–386)
IgG (Immunoglobin G), Serum: 725 mg/dL (ref 603–1613)
IgM (Immunoglobulin M), Srm: 26 mg/dL (ref 20–172)
M Protein SerPl Elph-Mcnc: 0.4 g/dL — ABNORMAL HIGH
Total Protein ELP: 6.4 g/dL (ref 6.0–8.5)

## 2021-12-16 LAB — SAMPLE TO BLOOD BANK

## 2021-12-16 MED ORDER — DEXAMETHASONE 4 MG PO TABS
20.0000 mg | ORAL_TABLET | Freq: Once | ORAL | Status: AC
  Administered 2021-12-16: 20 mg via ORAL
  Filled 2021-12-16: qty 5

## 2021-12-16 MED ORDER — PROCHLORPERAZINE MALEATE 10 MG PO TABS
10.0000 mg | ORAL_TABLET | Freq: Four times a day (QID) | ORAL | Status: DC | PRN
  Administered 2021-12-16: 10 mg via ORAL
  Filled 2021-12-16: qty 1

## 2021-12-16 MED ORDER — BORTEZOMIB CHEMO SQ INJECTION 3.5 MG (2.5MG/ML)
1.3000 mg/m2 | Freq: Once | INTRAMUSCULAR | Status: AC
  Administered 2021-12-16: 3 mg via SUBCUTANEOUS
  Filled 2021-12-16: qty 1.2

## 2021-12-16 NOTE — Patient Instructions (Signed)
[facility] [place]  Discharge Instructions: Thank you for choosing [facility] to provide your oncology and hematology care.  If you have a lab appointment with the Cancer Center, please go directly to the Cancer Center and check in at the registration area.  Wear comfortable clothing and clothing appropriate for easy access to any Portacath or PICC line.   We strive to give you quality time with your provider. You may need to reschedule your appointment if you arrive late (15 or more minutes).  Arriving late affects you and other patients whose appointments are after yours.  Also, if you miss three or more appointments without notifying the office, you may be dismissed from the clinic at the provider's discretion.      For prescription refill requests, have your pharmacy contact our office and allow 72 hours for refills to be completed.    Today you received the following chemotherapy and/or immunotherapy agents Velcade      To help prevent nausea and vomiting after your treatment, we encourage you to take your nausea medication as directed.  BELOW ARE SYMPTOMS THAT SHOULD BE REPORTED IMMEDIATELY: *FEVER GREATER THAN 100.4 F (38 C) OR HIGHER *CHILLS OR SWEATING *NAUSEA AND VOMITING THAT IS NOT CONTROLLED WITH YOUR NAUSEA MEDICATION *UNUSUAL SHORTNESS OF BREATH *UNUSUAL BRUISING OR BLEEDING *URINARY PROBLEMS (pain or burning when urinating, or frequent urination) *BOWEL PROBLEMS (unusual diarrhea, constipation, pain near the anus) TENDERNESS IN MOUTH AND THROAT WITH OR WITHOUT PRESENCE OF ULCERS (sore throat, sores in mouth, or a toothache) UNUSUAL RASH, SWELLING OR PAIN  UNUSUAL VAGINAL DISCHARGE OR ITCHING   Items with * indicate a potential emergency and should be followed up as soon as possible or go to the Emergency Department if any problems should occur.  Please show the CHEMOTHERAPY ALERT CARD or IMMUNOTHERAPY ALERT CARD at check-in to the  Emergency Department and triage nurse.  Should you have questions after your visit or need to cancel or reschedule your appointment, please contact [facility] [place]  [phone] and follow the prompts.  Office hours are 8:00 a.m. to 4:30 p.m. Monday - Friday. Please note that voicemails left after 4:00 p.m. may not be returned until the following business day.  We are closed weekends and major holidays. You have access to a nurse at all times for urgent questions. Please call the main number to the clinic [phone] and follow the prompts.  For any non-urgent questions, you may also contact your provider using MyChart. We now offer e-Visits for anyone 18 and older to request care online for non-urgent symptoms. For details visit [email or web address].   Also download the MyChart app! Go to the app store, search "MyChart", open the app, select [facility], and log in with your MyChart username and password.  Masks are optional in the cancer centers. If you would like for your care team to wear a mask while they are taking care of you, please let them know. For doctor visits, patients may have with them one support person who is at least 35 years old. At this time, visitors are not allowed in the infusion area.   

## 2021-12-18 ENCOUNTER — Ambulatory Visit
Admission: RE | Admit: 2021-12-18 | Discharge: 2021-12-18 | Disposition: A | Discharge status: Home / Self Care | Payer: Commercial Managed Care - PPO | Source: Ambulatory Visit | Attending: Oncology | Admitting: Oncology

## 2021-12-18 DIAGNOSIS — M545 Low back pain, unspecified: Secondary | ICD-10-CM | POA: Diagnosis present

## 2021-12-18 MED ORDER — GADOBUTROL 1 MMOL/ML IV SOLN
10.0000 mL | Freq: Once | INTRAVENOUS | Status: AC | PRN
  Administered 2021-12-18: 10 mL via INTRAVENOUS

## 2021-12-19 ENCOUNTER — Other Ambulatory Visit: Payer: Self-pay | Admitting: Oncology

## 2021-12-19 ENCOUNTER — Telehealth: Payer: Self-pay | Admitting: Oncology

## 2021-12-19 ENCOUNTER — Inpatient Hospital Stay: Payer: Commercial Managed Care - PPO

## 2021-12-19 DIAGNOSIS — C9000 Multiple myeloma not having achieved remission: Secondary | ICD-10-CM

## 2021-12-19 MED ORDER — TRAMADOL HCL 50 MG PO TABS: 50.0000 mg | ORAL_TABLET | Freq: Four times a day (QID) | ORAL | 0 refills | Status: DC | PRN

## 2021-12-19 NOTE — Telephone Encounter (Signed)
+  back pain MRI reviewed. No fracture.  Tylenol #3 not working. He agrees with trying Tramadol '50mg'$  Q6h PRN. New Rx sent.

## 2021-12-23 ENCOUNTER — Other Ambulatory Visit: Payer: Self-pay | Admitting: Oncology

## 2021-12-23 ENCOUNTER — Emergency Department
Admission: EM | Admit: 2021-12-23 | Discharge: 2021-12-24 | Disposition: A | Discharge status: Home / Self Care | Payer: Commercial Managed Care - PPO | Attending: Emergency Medicine | Admitting: Emergency Medicine

## 2021-12-23 ENCOUNTER — Other Ambulatory Visit: Payer: Self-pay

## 2021-12-23 ENCOUNTER — Emergency Department: Payer: Commercial Managed Care - PPO

## 2021-12-23 DIAGNOSIS — R0602 Shortness of breath: Secondary | ICD-10-CM | POA: Diagnosis not present

## 2021-12-23 DIAGNOSIS — R000 Tachycardia, unspecified: Secondary | ICD-10-CM | POA: Diagnosis not present

## 2021-12-23 DIAGNOSIS — R0789 Other chest pain: Secondary | ICD-10-CM | POA: Diagnosis not present

## 2021-12-23 DIAGNOSIS — Z20822 Contact with and (suspected) exposure to covid-19: Secondary | ICD-10-CM | POA: Insufficient documentation

## 2021-12-23 DIAGNOSIS — M549 Dorsalgia, unspecified: Secondary | ICD-10-CM

## 2021-12-23 DIAGNOSIS — C9000 Multiple myeloma not having achieved remission: Secondary | ICD-10-CM

## 2021-12-23 DIAGNOSIS — R079 Chest pain, unspecified: Secondary | ICD-10-CM

## 2021-12-23 DIAGNOSIS — R109 Unspecified abdominal pain: Secondary | ICD-10-CM | POA: Diagnosis not present

## 2021-12-23 LAB — COMPREHENSIVE METABOLIC PANEL
ALT: 13 U/L (ref 0–44)
AST: 18 U/L (ref 15–41)
Albumin: 4.2 g/dL (ref 3.5–5.0)
Alkaline Phosphatase: 71 U/L (ref 38–126)
Anion gap: 9 (ref 5–15)
BUN: 12 mg/dL (ref 6–20)
CO2: 24 mmol/L (ref 22–32)
Calcium: 9.6 mg/dL (ref 8.9–10.3)
Chloride: 104 mmol/L (ref 98–111)
Creatinine, Ser: 0.88 mg/dL (ref 0.61–1.24)
GFR, Estimated: >60 mL/min (ref >60)
Glucose, Bld: 93 mg/dL (ref 70–99)
Potassium: 3.7 mmol/L (ref 3.5–5.1)
Sodium: 137 mmol/L (ref 135–145)
Total Bilirubin: 0.8 mg/dL (ref 0.3–1.2)
Total Protein: 6.9 g/dL (ref 6.5–8.1)

## 2021-12-23 LAB — URINALYSIS, ROUTINE W REFLEX MICROSCOPIC
Bilirubin Urine: NEGATIVE
Glucose, UA: NEGATIVE mg/dL
Hgb urine dipstick: NEGATIVE
Ketones, ur: NEGATIVE mg/dL
Leukocytes,Ua: NEGATIVE
Nitrite: NEGATIVE
Protein, ur: NEGATIVE mg/dL
Specific Gravity, Urine: 1.016 (ref 1.005–1.030)
pH: 6 (ref 5.0–8.0)

## 2021-12-23 LAB — CBC WITH DIFFERENTIAL/PLATELET
Abs Immature Granulocytes: 0.01 10*3/uL (ref 0.00–0.07)
Basophils Absolute: 0 10*3/uL (ref 0.0–0.1)
Basophils Relative: 0 %
Eosinophils Absolute: 0 10*3/uL (ref 0.0–0.5)
Eosinophils Relative: 0 %
HCT: 38.1 % — ABNORMAL LOW (ref 39.0–52.0)
Hemoglobin: 12.8 g/dL — ABNORMAL LOW (ref 13.0–17.0)
Immature Granulocytes: 0 %
Lymphocytes Relative: 13 %
Lymphs Abs: 0.8 10*3/uL (ref 0.7–4.0)
MCH: 27.8 pg (ref 26.0–34.0)
MCHC: 33.6 g/dL (ref 30.0–36.0)
MCV: 82.6 fL (ref 80.0–100.0)
Monocytes Absolute: 0.9 10*3/uL (ref 0.1–1.0)
Monocytes Relative: 14 %
Neutro Abs: 4.4 10*3/uL (ref 1.7–7.7)
Neutrophils Relative %: 73 %
Platelets: 245 10*3/uL (ref 150–400)
RBC: 4.61 MIL/uL (ref 4.22–5.81)
RDW: 14 % (ref 11.5–15.5)
WBC: 6 10*3/uL (ref 4.0–10.5)
nRBC: 0 % (ref 0.0–0.2)

## 2021-12-23 LAB — RESP PANEL BY RT-PCR (RSV, FLU A&B, COVID)  RVPGX2
Influenza A by PCR: NEGATIVE
Influenza B by PCR: NEGATIVE
Resp Syncytial Virus by PCR: NEGATIVE
SARS Coronavirus 2 by RT PCR: NEGATIVE

## 2021-12-23 LAB — TROPONIN I (HIGH SENSITIVITY)
Troponin I (High Sensitivity): 4 ng/L (ref <18)
Troponin I (High Sensitivity): 4 ng/L (ref <18)

## 2021-12-23 LAB — LACTIC ACID, PLASMA: Lactic Acid, Venous: 1.4 mmol/L (ref 0.5–1.9)

## 2021-12-23 MED ORDER — REVLIMID 25 MG PO CAPS: ORAL_CAPSULE | ORAL | 0 refills | Status: DC

## 2021-12-23 NOTE — ED Triage Notes (Signed)
Pt c/o SOB that started today. S/S associated with chills and suspected fevers. No cough. No known sick exposure. Pt also endorses urinary frequency. Per note pt is ongoing chemotherapy, last treatment Tuesday last week. Triage HR 132.

## 2021-12-24 ENCOUNTER — Emergency Department: Payer: Commercial Managed Care - PPO

## 2021-12-24 ENCOUNTER — Other Ambulatory Visit: Payer: Self-pay

## 2021-12-24 MED ORDER — SODIUM CHLORIDE 0.9 % IV BOLUS
1000.0000 mL | Freq: Once | INTRAVENOUS | Status: AC
  Administered 2021-12-24: 1000 mL via INTRAVENOUS

## 2021-12-24 MED ORDER — IOHEXOL 350 MG/ML SOLN
100.0000 mL | Freq: Once | INTRAVENOUS | Status: AC | PRN
  Administered 2021-12-24: 100 mL via INTRAVENOUS

## 2021-12-24 MED ORDER — MORPHINE SULFATE (PF) 4 MG/ML IV SOLN
4.0000 mg | Freq: Once | INTRAVENOUS | Status: AC
  Administered 2021-12-24: 4 mg via INTRAVENOUS
  Filled 2021-12-24: qty 1

## 2021-12-24 MED ORDER — ONDANSETRON HCL 4 MG/2ML IJ SOLN
4.0000 mg | Freq: Once | INTRAMUSCULAR | Status: AC
  Administered 2021-12-24: 4 mg via INTRAVENOUS
  Filled 2021-12-24: qty 2

## 2021-12-24 NOTE — ED Notes (Signed)
Pt DC to home. Dc instructions reviewed with all questions answered. Pt verbalizes understanding. Iv removed, cath intact, pressure dressing applied. No bleeding noted at site. Pt ambulatory out of dept with steady gait

## 2021-12-24 NOTE — ED Notes (Signed)
Patient to CT at this time

## 2021-12-24 NOTE — ED Provider Notes (Signed)
[facility] Provider Note    Event Date/Time   First MD Initiated Contact with Patient 12/24/21 0013     (approximate)  History   Chief Complaint: Elevated heart rate  HPI  Tanner [name] is a 36 y.o. male with a past medical history of multiple myeloma currently on chemotherapy per the patient's last oncology note from 12/09/2021, presents to the emergency department for elevated heart rate.  According to the patient on Friday he was supposed to get his chemotherapy infusion however due to a fast heart rate they did not give him the infusion.  He has been monitoring his heart rate since that time and has remained elevated as high as 140 bpm at times.  Patient also states intermittently he will have a small amount of chest pressure in the center of his chest and some shortness of breath with exertion.  No history of DVT or PE.  Denies any cough congestion.  Patient does state some intermittent chills but states that has been ongoing issue for quite some time.  Denies any vomiting abdominal pain diarrhea dysuria though did note some urine frequency.  Physical Exam   Triage Vital Signs: ED Triage Vitals  Enc Vitals Group     BP 12/23/21 1959 125/86     Pulse Rate 12/23/21 1959 (!) 135     Resp 12/23/21 1959 (!) 22     Temp 12/23/21 2117 99.3 F (37.4 C)     Temp Source 12/23/21 2117 Oral     SpO2 12/23/21 1959 100 %     Weight 12/23/21 1955 244 lb (110.7 kg)     Height 12/23/21 1955 6' (1.829 m)     Head Circumference --      Peak Flow --      Pain Score 12/23/21 1954 0     Pain Loc --      Pain Edu? --      Excl. in [place]? --     Most recent vital signs: Vitals:   12/23/21 2117 12/23/21 2245  BP:  (!) 112/94  Pulse:  (!) 120  Resp:  (!) 21  Temp: 99.3 F (37.4 C) 98.6 F (37 C)  SpO2:  100%    General: Awake, no distress.  CV:  Good peripheral perfusion.  Regular rate around 120 bpm. Resp:  Normal effort.  Equal breath sounds  bilaterally.  No pleuritic pain. Abd:  No distention.  Soft, nontender.  No rebound or guarding.  ED Results / Procedures / Treatments   EKG  EKG viewed and interpreted by myself shows sinus tachycardia 132 bpm with a narrow QRS, normal axis, normal intervals, no concerning ST changes.  RADIOLOGY  I have reviewed and interpreted the chest x-ray images.  No consolidation seen on my evaluation. Radiology is read the chest x-ray is negative   MEDICATIONS ORDERED IN ED: Medications  sodium chloride 0.9 % bolus 1,000 mL (has no administration in time range)  morphine (PF) 4 MG/ML injection 4 mg (has no administration in time range)  ondansetron (ZOFRAN) injection 4 mg (has no administration in time range)     IMPRESSION / MDM / ASSESSMENT AND PLAN / ED COURSE  I reviewed the triage vital signs and the nursing notes.  Patient's presentation is most consistent with acute presentation with potential threat to life or bodily function.  Patient presents to the emergency department for elevated heart rate some mild shortness of breath and chest pressure at times.  Overall  the patient appears well, no distress.  Patient does have a history of multiple myeloma currently on chemotherapy.  Patient also describes some lower back pain but states that has been ongoing for quite some time.  I reviewed the patient's chart and he recently had an MRI of the lumbar spine with and without contrast showing some multiple myeloma lesions but no other concerning findings.  Suspect that is likely the cause of the patient's back pain.  Has been taking Tylenol with codeine at home was recently prescribed tramadol but is not yet started this medication.  We will place an IV, IV hydrate, treat pain.  Given the patient's history of multiple myeloma now with tachycardia and some chest discomfort at times and shortness of breath at times we will obtain a CTA of the chest to rule out pulmonary embolism.  Patient agreeable to  plan of care.  CT scan is negative for PE does have a T8 endplate compression fracture which could be the cause of the pain.  After pain medication patient's heart rate is around 100 bpm he is only received approximately 300 cc of the fluid.  We will run the remainder 700 cc.  Discussed with the patient he should try taking his prescribed tramadol that was prescribed by his oncologist.  Patient will follow-up with his oncologist.  Patient agreeable to plan.  Otherwise reassuring workup including negative troponin, normal lactate, normal chemistry reassuring CBC normal urinalysis negative COVID/flu/RSV and now an overall reassuring CT scan of the chest abdomen and pelvis besides multiple myeloma lesions and likely T8 pathologic fracture  FINAL CLINICAL IMPRESSION(S) / ED DIAGNOSES   Tachycardia Dyspnea   Note:  This document was prepared using Dragon voice recognition software and may include unintentional dictation errors.   Harvest Dark, MD 12/24/21 [phone]

## 2021-12-25 ENCOUNTER — Inpatient Hospital Stay: Payer: Commercial Managed Care - PPO

## 2021-12-25 VITALS — BP 120/79 | HR 111 | Temp 97.0°F | Resp 20 | Wt 237.4 lb

## 2021-12-25 DIAGNOSIS — Z5112 Encounter for antineoplastic immunotherapy: Secondary | ICD-10-CM | POA: Diagnosis not present

## 2021-12-25 DIAGNOSIS — C9000 Multiple myeloma not having achieved remission: Secondary | ICD-10-CM

## 2021-12-25 LAB — CBC WITH DIFFERENTIAL/PLATELET
Abs Immature Granulocytes: 0.01 10*3/uL (ref 0.00–0.07)
Basophils Absolute: 0 10*3/uL (ref 0.0–0.1)
Basophils Relative: 1 %
Eosinophils Absolute: 0.1 10*3/uL (ref 0.0–0.5)
Eosinophils Relative: 2 %
HCT: 36.8 % — ABNORMAL LOW (ref 39.0–52.0)
Hemoglobin: 12.7 g/dL — ABNORMAL LOW (ref 13.0–17.0)
Immature Granulocytes: 0 %
Lymphocytes Relative: 18 %
Lymphs Abs: 0.7 10*3/uL (ref 0.7–4.0)
MCH: 28.1 pg (ref 26.0–34.0)
MCHC: 34.5 g/dL (ref 30.0–36.0)
MCV: 81.4 fL (ref 80.0–100.0)
Monocytes Absolute: 0.7 10*3/uL (ref 0.1–1.0)
Monocytes Relative: 17 %
Neutro Abs: 2.4 10*3/uL (ref 1.7–7.7)
Neutrophils Relative %: 62 %
Platelets: 280 10*3/uL (ref 150–400)
RBC: 4.52 MIL/uL (ref 4.22–5.81)
RDW: 13.8 % (ref 11.5–15.5)
WBC: 3.9 10*3/uL — ABNORMAL LOW (ref 4.0–10.5)
nRBC: 0 % (ref 0.0–0.2)

## 2021-12-25 LAB — COMPREHENSIVE METABOLIC PANEL
ALT: 17 U/L (ref 0–44)
AST: 20 U/L (ref 15–41)
Albumin: 4.2 g/dL (ref 3.5–5.0)
Alkaline Phosphatase: 73 U/L (ref 38–126)
Anion gap: 8 (ref 5–15)
BUN: 11 mg/dL (ref 6–20)
CO2: 26 mmol/L (ref 22–32)
Calcium: 9.1 mg/dL (ref 8.9–10.3)
Chloride: 105 mmol/L (ref 98–111)
Creatinine, Ser: 0.98 mg/dL (ref 0.61–1.24)
GFR, Estimated: >60 mL/min (ref >60)
Glucose, Bld: 107 mg/dL — ABNORMAL HIGH (ref 70–99)
Potassium: 3.9 mmol/L (ref 3.5–5.1)
Sodium: 139 mmol/L (ref 135–145)
Total Bilirubin: 0.6 mg/dL (ref 0.3–1.2)
Total Protein: 7.2 g/dL (ref 6.5–8.1)

## 2021-12-25 LAB — SAMPLE TO BLOOD BANK

## 2021-12-25 MED ORDER — PROCHLORPERAZINE MALEATE 10 MG PO TABS
10.0000 mg | ORAL_TABLET | Freq: Once | ORAL | Status: AC
  Administered 2021-12-25: 10 mg via ORAL
  Filled 2021-12-25: qty 1

## 2021-12-25 MED ORDER — BORTEZOMIB CHEMO SQ INJECTION 3.5 MG (2.5MG/ML)
1.3000 mg/m2 | Freq: Once | INTRAMUSCULAR | Status: AC
  Administered 2021-12-25: 3 mg via SUBCUTANEOUS
  Filled 2021-12-25: qty 1.2

## 2021-12-25 NOTE — Patient Instructions (Signed)
[facility]  Discharge Instructions: Thank you for choosing [facility] to provide your oncology and hematology care.  If you have a lab appointment with the Cancer Center, please go directly to the Cancer Center and check in at the registration area.  Wear comfortable clothing and clothing appropriate for easy access to any Portacath or PICC line.   We strive to give you quality time with your provider. You may need to reschedule your appointment if you arrive late (15 or more minutes).  Arriving late affects you and other patients whose appointments are after yours.  Also, if you miss three or more appointments without notifying the office, you may be dismissed from the clinic at the provider's discretion.      For prescription refill requests, have your pharmacy contact our office and allow 72 hours for refills to be completed.    Today you received the following chemotherapy and/or immunotherapy agents Velcade      To help prevent nausea and vomiting after your treatment, we encourage you to take your nausea medication as directed.  BELOW ARE SYMPTOMS THAT SHOULD BE REPORTED IMMEDIATELY: *FEVER GREATER THAN 100.4 F (38 C) OR HIGHER *CHILLS OR SWEATING *NAUSEA AND VOMITING THAT IS NOT CONTROLLED WITH YOUR NAUSEA MEDICATION *UNUSUAL SHORTNESS OF BREATH *UNUSUAL BRUISING OR BLEEDING *URINARY PROBLEMS (pain or burning when urinating, or frequent urination) *BOWEL PROBLEMS (unusual diarrhea, constipation, pain near the anus) TENDERNESS IN MOUTH AND THROAT WITH OR WITHOUT PRESENCE OF ULCERS (sore throat, sores in mouth, or a toothache) UNUSUAL RASH, SWELLING OR PAIN  UNUSUAL VAGINAL DISCHARGE OR ITCHING   Items with * indicate a potential emergency and should be followed up as soon as possible or go to the Emergency Department if any problems should occur.  Please show the CHEMOTHERAPY ALERT CARD or IMMUNOTHERAPY ALERT CARD at check-in to the  Emergency Department and triage nurse.  Should you have questions after your visit or need to cancel or reschedule your appointment, please contact [facility]  [phone] and follow the prompts.  Office hours are 8:00 a.m. to 4:30 p.m. Monday - Friday. Please note that voicemails left after 4:00 p.m. may not be returned until the following business day.  We are closed weekends and major holidays. You have access to a nurse at all times for urgent questions. Please call the main number to the clinic [phone] and follow the prompts.  For any non-urgent questions, you may also contact your provider using MyChart. We now offer e-Visits for anyone 18 and older to request care online for non-urgent symptoms. For details visit [email or web address].   Also download the MyChart app! Go to the app store, search "MyChart", open the app, select [facility], and log in with your MyChart username and password.  Masks are optional in the cancer centers. If you would like for your care team to wear a mask while they are taking care of you, please let them know. For doctor visits, patients may have with them one support person who is at least 35 years old. At this time, visitors are not allowed in the infusion area.   

## 2021-12-26 ENCOUNTER — Other Ambulatory Visit: Payer: Self-pay

## 2022-01-05 ENCOUNTER — Other Ambulatory Visit: Payer: Self-pay | Admitting: Oncology

## 2022-01-06 NOTE — Telephone Encounter (Signed)
  Component Ref Range & Units 12 d ago (12/25/21) 2 wk ago (12/23/21) 3 wk ago (12/16/21) 4 wk ago (12/09/21) 1 mo ago (11/25/21) 1 mo ago (11/18/21) 1 mo ago (11/11/21)  Potassium 3.5 - 5.1 mmol/L 3.9 3.7 3.7 3.5 3.7 3.5 3.8

## 2022-01-08 ENCOUNTER — Encounter: Payer: Self-pay | Admitting: Oncology

## 2022-01-08 ENCOUNTER — Inpatient Hospital Stay (HOSPITAL_BASED_OUTPATIENT_CLINIC_OR_DEPARTMENT_OTHER): Payer: Commercial Managed Care - PPO | Admitting: Oncology

## 2022-01-08 ENCOUNTER — Inpatient Hospital Stay: Payer: Commercial Managed Care - PPO | Attending: Oncology

## 2022-01-08 ENCOUNTER — Inpatient Hospital Stay: Payer: Commercial Managed Care - PPO

## 2022-01-08 VITALS — HR 100

## 2022-01-08 VITALS — BP 111/80 | HR 115 | Temp 97.2°F | Resp 100 | Wt 237.5 lb

## 2022-01-08 DIAGNOSIS — Z5112 Encounter for antineoplastic immunotherapy: Secondary | ICD-10-CM | POA: Diagnosis not present

## 2022-01-08 DIAGNOSIS — N189 Chronic kidney disease, unspecified: Secondary | ICD-10-CM | POA: Diagnosis not present

## 2022-01-08 DIAGNOSIS — C9000 Multiple myeloma not having achieved remission: Secondary | ICD-10-CM

## 2022-01-08 DIAGNOSIS — G8929 Other chronic pain: Secondary | ICD-10-CM

## 2022-01-08 DIAGNOSIS — E876 Hypokalemia: Secondary | ICD-10-CM

## 2022-01-08 DIAGNOSIS — Z5111 Encounter for antineoplastic chemotherapy: Secondary | ICD-10-CM

## 2022-01-08 DIAGNOSIS — C7951 Secondary malignant neoplasm of bone: Secondary | ICD-10-CM | POA: Diagnosis not present

## 2022-01-08 DIAGNOSIS — M545 Low back pain, unspecified: Secondary | ICD-10-CM

## 2022-01-08 LAB — CBC WITH DIFFERENTIAL/PLATELET
Abs Immature Granulocytes: 0 10*3/uL (ref 0.00–0.07)
Basophils Absolute: 0 10*3/uL (ref 0.0–0.1)
Basophils Relative: 1 %
Eosinophils Absolute: 0.2 10*3/uL (ref 0.0–0.5)
Eosinophils Relative: 6 %
HCT: 38 % — ABNORMAL LOW (ref 39.0–52.0)
Hemoglobin: 12.9 g/dL — ABNORMAL LOW (ref 13.0–17.0)
Immature Granulocytes: 0 %
Lymphocytes Relative: 30 %
Lymphs Abs: 1.1 10*3/uL (ref 0.7–4.0)
MCH: 26.9 pg (ref 26.0–34.0)
MCHC: 33.9 g/dL (ref 30.0–36.0)
MCV: 79.3 fL — ABNORMAL LOW (ref 80.0–100.0)
Monocytes Absolute: 0.5 10*3/uL (ref 0.1–1.0)
Monocytes Relative: 12 %
Neutro Abs: 1.9 10*3/uL (ref 1.7–7.7)
Neutrophils Relative %: 51 %
Platelets: 362 10*3/uL (ref 150–400)
RBC: 4.79 MIL/uL (ref 4.22–5.81)
RDW: 13.5 % (ref 11.5–15.5)
WBC: 3.8 10*3/uL — ABNORMAL LOW (ref 4.0–10.5)
nRBC: 0 % (ref 0.0–0.2)

## 2022-01-08 LAB — COMPREHENSIVE METABOLIC PANEL
ALT: 13 U/L (ref 0–44)
AST: 17 U/L (ref 15–41)
Albumin: 4.2 g/dL (ref 3.5–5.0)
Alkaline Phosphatase: 88 U/L (ref 38–126)
Anion gap: 9 (ref 5–15)
BUN: 10 mg/dL (ref 6–20)
CO2: 27 mmol/L (ref 22–32)
Calcium: 9.3 mg/dL (ref 8.9–10.3)
Chloride: 100 mmol/L (ref 98–111)
Creatinine, Ser: 0.89 mg/dL (ref 0.61–1.24)
GFR, Estimated: >60 mL/min (ref >60)
Glucose, Bld: 97 mg/dL (ref 70–99)
Potassium: 3.7 mmol/L (ref 3.5–5.1)
Sodium: 136 mmol/L (ref 135–145)
Total Bilirubin: 0.6 mg/dL (ref 0.3–1.2)
Total Protein: 7.3 g/dL (ref 6.5–8.1)

## 2022-01-08 MED ORDER — DARATUMUMAB-HYALURONIDASE-FIHJ 1800-30000 MG-UT/15ML ~~LOC~~ SOLN
1800.0000 mg | Freq: Once | SUBCUTANEOUS | Status: AC
  Administered 2022-01-08: 1800 mg via SUBCUTANEOUS
  Filled 2022-01-08: qty 15

## 2022-01-08 MED ORDER — ACETAMINOPHEN 325 MG PO TABS
650.0000 mg | ORAL_TABLET | Freq: Once | ORAL | Status: AC
  Administered 2022-01-08: 650 mg via ORAL
  Filled 2022-01-08: qty 2

## 2022-01-08 MED ORDER — DIPHENHYDRAMINE HCL 25 MG PO CAPS
50.0000 mg | ORAL_CAPSULE | Freq: Once | ORAL | Status: AC
  Administered 2022-01-08: 50 mg via ORAL
  Filled 2022-01-08: qty 2

## 2022-01-08 MED ORDER — MONTELUKAST SODIUM 10 MG PO TABS
10.0000 mg | ORAL_TABLET | Freq: Once | ORAL | Status: AC
  Administered 2022-01-08: 10 mg via ORAL
  Filled 2022-01-08: qty 1

## 2022-01-08 MED ORDER — DEXAMETHASONE 4 MG PO TABS
20.0000 mg | ORAL_TABLET | Freq: Once | ORAL | Status: AC
  Administered 2022-01-08: 20 mg via ORAL
  Filled 2022-01-08: qty 5

## 2022-01-08 MED ORDER — MEPERIDINE HCL 25 MG/ML IJ SOLN: 25.0000 mg | Freq: Once | INTRAMUSCULAR | Status: DC

## 2022-01-08 NOTE — Assessment & Plan Note (Signed)
Chemotherapy plan as listed above 

## 2022-01-08 NOTE — Assessment & Plan Note (Addendum)
PET scan showed multiple osseous lesions.  Previously discussed with neurosurgeon and radiation oncology.  No intervention for now. Awaiting dental clearance for bone strengthening agents- Dentist recommends wisdom teeth extraction.  MRI lumbar reviewed.

## 2022-01-08 NOTE — Patient Instructions (Signed)
[facility] [place]  Discharge Instructions: Thank you for choosing [facility] to provide your oncology and hematology care.  If you have a lab appointment with the [facility], please go directly to the [facility] and check in at the registration area.  Wear comfortable clothing and clothing appropriate for easy access to any Portacath or PICC line.   We strive to give you quality time with your provider. You may need to reschedule your appointment if you arrive late (15 or more minutes).  Arriving late affects you and other patients whose appointments are after yours.  Also, if you miss three or more appointments without notifying the office, you may be dismissed from the clinic at the provider's discretion.      For prescription refill requests, have your pharmacy contact our office and allow 72 hours for refills to be completed.    Today you received the following chemotherapy and/or immunotherapy agents Darzalex      To help prevent nausea and vomiting after your treatment, we encourage you to take your nausea medication as directed.  BELOW ARE SYMPTOMS THAT SHOULD BE REPORTED IMMEDIATELY: *FEVER GREATER THAN 100.4 F (38 C) OR HIGHER *CHILLS OR SWEATING *NAUSEA AND VOMITING THAT IS NOT CONTROLLED WITH YOUR NAUSEA MEDICATION *UNUSUAL SHORTNESS OF BREATH *UNUSUAL BRUISING OR BLEEDING *URINARY PROBLEMS (pain or burning when urinating, or frequent urination) *BOWEL PROBLEMS (unusual diarrhea, constipation, pain near the anus) TENDERNESS IN MOUTH AND THROAT WITH OR WITHOUT PRESENCE OF ULCERS (sore throat, sores in mouth, or a toothache) UNUSUAL RASH, SWELLING OR PAIN  UNUSUAL VAGINAL DISCHARGE OR ITCHING   Items with * indicate a potential emergency and should be followed up as soon as possible or go to the Emergency Department if any problems should occur.  Please show the CHEMOTHERAPY ALERT CARD or IMMUNOTHERAPY ALERT CARD at check-in to  the Emergency Department and triage nurse.  Should you have questions after your visit or need to cancel or reschedule your appointment, please contact [facility] [place] [facility] [place]  [phone] and follow the prompts.  Office hours are 8:00 a.m. to 4:30 p.m. Monday - Friday. Please note that voicemails left after 4:00 p.m. may not be returned until the following business day.  We are closed weekends and major holidays. You have access to a nurse at all times for urgent questions. Please call the main number to the clinic [phone] and follow the prompts.  For any non-urgent questions, you may also contact your provider using MyChart. We now offer e-Visits for anyone 59 and older to request care online for non-urgent symptoms. For details visit [email or web address].   Also download the MyChart app! Go to the app store, search "MyChart", open the app, select [facility], and log in with your MyChart username and password.

## 2022-01-08 NOTE — Assessment & Plan Note (Signed)
Recommend Tramadol PRN

## 2022-01-08 NOTE — Assessment & Plan Note (Signed)
Avoid nephrotoxins, encourage oral hydration

## 2022-01-08 NOTE — Progress Notes (Signed)
Hematology/Oncology Progress note [phone] [phone]        REFERRING PROVIDER: [name], MD    ASSESSMENT & PLAN:   Cancer Staging  Multiple myeloma not having achieved remission [name]) Staging form: Plasma Cell Myeloma and Plasma Cell Disorders, AJCC 8th Edition - Clinical stage from 08/20/2021: RISS Stage II (Beta-2-microglobulin (mg/L): 3.6, Albumin (g/dL): 2.2, ISS: Stage II, High-risk cytogenetics: Absent, LDH: Normal) - Signed by Tanner Server, MD on 10/14/2021   Multiple myeloma not having achieved remission ([name]) # IgG lamda multiple myeloma On chemotherapy plan with lenalidomide (25 mg daily on days 1-14), subcutaneous bortezomib (1.3 mg/m2 on days 1, 4, 8, and 11), and oral dexamethasone (20 mg on days 1, 2, 8, 9, 15, and 16). SubQ daratumumab on days 1, 8, and 15 of cycles 1 through 4 and day 1 of  consolidation cycles (cycles 5 and 6).   Labs are reviewed and discussed with patient. VGPR M protein 0.4, today's level is pending.  Dara VRD.   I will proceed with Daratumumab maintenance. He is going to establish care with Dr. Tracey [name]. Will most likely get bone marrow biopsy at [name], upcoming bone marrow transplant.  Continue  Aspirin 54m daily  #Hyperuricemia, continue allopurinol  Metastatic cancer to bone ([name], [place] PET scan showed multiple osseous lesions.  Previously discussed with neurosurgeon and radiation oncology.  No intervention for now. Awaiting dental clearance for bone strengthening agents- Dentist recommends wisdom teeth extraction.  MRI lumbar reviewed.   CKD (chronic kidney disease) Avoid nephrotoxins, encourage oral hydration  Encounter for antineoplastic chemotherapy Chemotherapy plan as listed above.   Back pain Recommend Tramadol PRN     Hypokalemia Continue potassium supplementation.    Recommend patient to obtain dental clearance  Follow up  Per LOS.  All questions were answered. The patient knows to call the clinic  with any problems, questions or concerns. No barriers to learning was detected.  Tanner Server MD 01/08/2022   CHIEF COMPLAINTS/PURPOSE OF CONSULTATION:  Multiple myeloma  HISTORY OF PRESENTING ILLNESS:  CChales Pelissier89y.o. male presents  for follow up of Multiple myeloma I have reviewed his chart and materials related to his cancer extensively and collaborated history with the patient. Summary of oncologic history is as follows: Oncology History  Multiple myeloma not having achieved remission ([facility]  08/14/2021 Imaging   CT chest abdomen pelvis without contrast Showed numerous lucencies throughout the bone structures, most pronounced throughout the spine and left left iliac bone.  Otherwise no acute findings in the chest abdomen or pelvis.  Sigmoid diverticulosis.   08/19/2021 Initial Diagnosis   Multiple myeloma not having achieved remission, stage II  -08/15/2021, bone marrow biopsy showed plasma cell myeloma involving greater than 80% of the marrow. Cytogenetics Myeloma  FISH   08/20/2021 Cancer Staging   Staging form: Plasma Cell Myeloma and Plasma Cell Disorders, AJCC 8th Edition - Clinical stage from 08/20/2021: RISS Stage II (Beta-2-microglobulin (mg/L): 3.6, Albumin (g/dL): 2.2, ISS: Stage II, High-risk cytogenetics: Absent, LDH: Normal) - Signed by YEarlie Server MD on 10/14/2021 Beta 2 microglobulin range (mg/L): 3.5 to 5.49 Albumin range (g/dL): Less than 3.5 Cytogenetics: No abnormalities   08/20/2021 -  Chemotherapy   MYELOMA NEWLY DIAGNOSED TRANSPLANT CANDIDATE DaraVRd (Daratumumab SUBQ) q21d x 6 Cycles (Induction/Consolidation)      09/11/2021 Imaging   PET scan showed  1. Extensive multifocal hypermetabolic disease throughout the axial and appendicular skeleton, with numerous lucent osseous lesions,consistent with patient's known diagnosis of multiple myeloma. 2.  No tracer avid lymph node or mass identified to suggest soft tissue plasmacytoma     INTERVAL  HISTORY Tanner [name] is a 37 y.o. male who has above history reviewed by me today presents for follow up visit for management of multiple myeloma Denies any neuropathy symptoms. Tolerates Revlimid, no diarrhea + chronic back pain has gotten worse recently. He takes Tramadol PRN, manageable pain.  He takes potassium supplementation,  MEDICAL HISTORY:  Past Medical History:  Diagnosis Date   Alcohol use    Sciatica    Smoking     SURGICAL HISTORY: Past Surgical History:  Procedure Laterality Date   INGUINAL HERNIA REPAIR      SOCIAL HISTORY: Social History   Socioeconomic History   Marital status: Single    Spouse name: Not on file   Number of children: Not on file   Years of education: Not on file   Highest education level: Not on file  Occupational History   Not on file  Tobacco Use   Smoking status: Former    Packs/day: 0.50    Years: 10.00    Total pack years: 5.00    Types: E-cigarettes, Cigarettes    Quit date: 08/11/2021    Years since quitting: 0.4   Smokeless tobacco: Never  Vaping Use   Vaping Use: Every day  Substance and Sexual Activity   Alcohol use: Not Currently    Alcohol/week: 24.0 standard drinks of alcohol    Types: 24 Cans of beer per week    Comment: quit drinking approx 08/11/2021   Drug use: Never   Sexual activity: Not on file  Other Topics Concern   Not on file  Social History Narrative   Not on file   Social Determinants of Health   Financial Resource Strain: Not on file  Food Insecurity: Not on file  Transportation Needs: Not on file  Physical Activity: Not on file  Stress: Not on file  Social Connections: Not on file  Intimate Partner Violence: Not on file    FAMILY HISTORY: Family History  Problem Relation Age of Onset   Ovarian cancer Mother    Asthma Mother    Alcoholism Father    Throat cancer Maternal Uncle    Heart disease Maternal Uncle    Lung cancer Maternal Uncle    Lymphoma Maternal Uncle    Lung  cancer Paternal Grandmother     ALLERGIES:  has No Known Allergies.  MEDICATIONS:  Current Outpatient Medications  Medication Sig Dispense Refill   acyclovir (ZOVIRAX) 400 MG tablet Take 1 tablet (400 mg total) by mouth 2 (two) times daily. 60 tablet 5   allopurinol (ZYLOPRIM) 100 MG tablet Take 0.5 tablets (50 mg total) by mouth daily. 30 tablet 2   aspirin EC 81 MG tablet Take 81 mg by mouth daily. Swallow whole.     dexamethasone (DECADRON) 4 MG tablet Take 5 tablets (20 mg total) by mouth See admin instructions. take dexamethasone 20 mg, day 2, 9,16. 60 tablet 0   KLOR-CON M20 20 MEQ tablet TAKE 1 TABLET BY MOUTH EVERY DAY 30 tablet 0   REVLIMID 25 MG capsule TAKE 1 CAPSULE BY MOUTH ONCE DAILY FOR 14 DAYS ON AND 7 DAYS OFF 14 capsule 0   traMADol (ULTRAM) 50 MG tablet Take 1 tablet (50 mg total) by mouth every 6 (six) hours as needed. 30 tablet 0   ondansetron (ZOFRAN) 8 MG tablet Take 1 tablet (8 mg total) by mouth 2 (two) times  daily as needed for nausea or vomiting. (Patient not taking: Reported on 10/28/2021) 20 tablet 2   prochlorperazine (COMPAZINE) 10 MG tablet Take 1 tablet (10 mg total) by mouth every 6 (six) hours as needed for nausea or vomiting. (Patient not taking: Reported on 10/28/2021) 30 tablet 2   No current facility-administered medications for this visit.    Review of Systems  Constitutional:  Positive for fatigue. Negative for appetite change, chills, fever and unexpected weight change.  HENT:   Negative for hearing loss and voice change.   Eyes:  Negative for eye problems and icterus.  Respiratory:  Negative for chest tightness, cough and shortness of breath.   Cardiovascular:  Negative for chest pain and leg swelling.  Gastrointestinal:  Negative for abdominal distention and abdominal pain.  Endocrine: Negative for hot flashes.  Genitourinary:  Negative for difficulty urinating, dysuria and frequency.   Musculoskeletal:  Positive for back pain. Negative for  arthralgias.  Skin:  Negative for itching and rash.  Neurological:  Negative for light-headedness and numbness.  Hematological:  Negative for adenopathy. Does not bruise/bleed easily.  Psychiatric/Behavioral:  Negative for confusion.      PHYSICAL EXAMINATION: ECOG PERFORMANCE STATUS: 1 - Symptomatic but completely ambulatory  Vitals:   01/08/22 0949  BP: 111/80  Pulse: (!) 115  Resp: (!) 100  Temp: (!) 97.2 F (36.2 C)   Filed Weights   01/08/22 0949  Weight: 237 lb 8 oz (107.7 kg)    Physical Exam Constitutional:      General: He is not in acute distress.    Appearance: He is obese. He is not diaphoretic.  HENT:     Head: Normocephalic and atraumatic.     Mouth/Throat:     Pharynx: No oropharyngeal exudate.  Eyes:     General: No scleral icterus. Cardiovascular:     Rate and Rhythm: Normal rate.     Heart sounds: No murmur heard. Pulmonary:     Effort: Pulmonary effort is normal. No respiratory distress.  Abdominal:     General: There is no distension.     Palpations: Abdomen is soft.  Musculoskeletal:        General: Normal range of motion.     Cervical back: Normal range of motion.  Skin:    General: Skin is warm and dry.     Findings: No erythema.  Neurological:     Mental Status: He is alert and oriented to person, place, and time. Mental status is at baseline.     Cranial Nerves: No cranial nerve deficit.     Motor: No abnormal muscle tone.  Psychiatric:        Mood and Affect: Mood and affect normal.      LABORATORY DATA:  I have reviewed the data as listed    Latest Ref Rng & Units 01/08/2022    9:25 AM 12/25/2021   12:59 PM 12/23/2021    8:00 PM  CBC  WBC 4.0 - 10.5 K/uL 3.8  3.9  6.0   Hemoglobin 13.0 - 17.0 g/dL 12.9  12.7  12.8   Hematocrit 39.0 - 52.0 % 38.0  36.8  38.1   Platelets 150 - 400 K/uL 362  280  245       Latest Ref Rng & Units 01/08/2022    9:25 AM 12/25/2021   12:59 PM 12/23/2021    8:00 PM  CMP  Glucose 70 - 99 mg/dL  97  107  93   BUN 6 -  20 mg/dL _0 Creatinine 0.61 - 1.24 mg/dL 0.89  0.98  0.88   Sodium 135 - 145 mmol/L 136  139  137   Potassium 3.5 - 5.1 mmol/L 3.7  3.9  3.7   Chloride 98 - 111 mmol/L 100  105  104   CO2 22 - 32 mmol/L _1 Calcium 8.9 - 10.3 mg/dL 9.3  9.1  9.6   Total Protein 6.5 - 8.1 g/dL 7.3  7.2  6.9   Total Bilirubin 0.3 - 1.2 mg/dL 0.6  0.6  0.8   Alkaline Phos 38 - 126 U/L 88  73  71   AST 15 - 41 U/L _2 ALT 0 - 44 U/L _3 RADIOGRAPHIC STUDIES: I have personally reviewed the radiological images as listed and agreed with the findings in the report. CT Angio Chest PE W and/or Wo Contrast  Result Date: 12/24/2021 CLINICAL DATA:  Shortness of breath, fever, chills, upper abdominal pain. History of multiple myeloma. EXAM: CT ANGIOGRAPHY CHEST CT ABDOMEN AND PELVIS WITH CONTRAST TECHNIQUE: Multidetector CT imaging of the chest was performed using the standard protocol during bolus administration of intravenous contrast. Multiplanar CT image reconstructions and MIPs were obtained to evaluate the vascular anatomy. Multidetector CT imaging of the abdomen and pelvis was performed using the standard protocol during bolus administration of intravenous contrast. RADIATION DOSE REDUCTION: This exam was performed according to the departmental dose-optimization program which includes automated exposure control, adjustment of the mA and/or kV according to patient size and/or use of iterative reconstruction technique. CONTRAST:  187m OMNIPAQUE IOHEXOL 350 MG/ML SOLN COMPARISON:  PET-CT dated 09/10/2021. FINDINGS: CTA CHEST FINDINGS Cardiovascular: Satisfactory opacification of the bilateral pulmonary arteries to the segmental level. No evidence of pulmonary embolism. Although not tailored for evaluation of the thoracic aorta, there is no evidence of thoracic aortic aneurysm or dissection. The heart is normal in size.  No pericardial effusion.  Mediastinum/Nodes: No suspicious mediastinal lymphadenopathy. Visualized thyroid is unremarkable. Lungs/Pleura: Mild bilateral lower lobe atelectasis. No focal consolidation. No suspicious pulmonary nodules. No pleural effusion or pneumothorax. Musculoskeletal: Scattered lytic lesions throughout the visualized axial and appendicular skeleton, most of which are grossly unchanged. However, there is a dominant lytic lesion in the T8 vertebral body with interval superior endplate pathologic fracture (sagittal image 82). Review of the MIP images confirms the above findings. CT ABDOMEN and PELVIS FINDINGS Hepatobiliary: Liver is within normal limits. Gallbladder is unremarkable. No intrahepatic or extrahepatic duct dilatation. Pancreas: Within normal limits. Spleen: Within normal limits. Adrenals/Urinary Tract: Adrenal glands are within normal limits. Kidneys are within normal limits.  No hydronephrosis. Bladder is within normal limits. Stomach/Bowel: Stomach is within normal limits No evidence of bowel obstruction. Normal appendix (series 4/image 32). No colonic wall thickening or inflammatory changes. Vascular/Lymphatic: No evidence of abdominal aortic aneurysm. No suspicious abdominopelvic lymphadenopathy. Reproductive: Prostate is unremarkable. Other: No abdominopelvic ascites. Prior right inguinal hernia mesh repair. Musculoskeletal: Scattered lytic lesions throughout the visualized axial and appendicular skeleton, grossly unchanged. Review of the MIP images confirms the above findings. IMPRESSION: No evidence of pulmonary embolism. No acute findings in the abdomen/pelvis. Dominant lytic lesion in the T8 vertebral body with interval superior endplate pathologic fracture. Additional scattered lytic lesions throughout the visualized axial and appendicular skeleton in this patient with known multiple myeloma, otherwise grossly unchanged. Electronically Signed   By:  Julian Hy M.D.   On: 12/24/2021 01:20   CT  ABDOMEN PELVIS W CONTRAST  Result Date: 12/24/2021 CLINICAL DATA:  Shortness of breath, fever, chills, upper abdominal pain. History of multiple myeloma. EXAM: CT ANGIOGRAPHY CHEST CT ABDOMEN AND PELVIS WITH CONTRAST TECHNIQUE: Multidetector CT imaging of the chest was performed using the standard protocol during bolus administration of intravenous contrast. Multiplanar CT image reconstructions and MIPs were obtained to evaluate the vascular anatomy. Multidetector CT imaging of the abdomen and pelvis was performed using the standard protocol during bolus administration of intravenous contrast. RADIATION DOSE REDUCTION: This exam was performed according to the departmental dose-optimization program which includes automated exposure control, adjustment of the mA and/or kV according to patient size and/or use of iterative reconstruction technique. CONTRAST:  163m OMNIPAQUE IOHEXOL 350 MG/ML SOLN COMPARISON:  PET-CT dated 09/10/2021. FINDINGS: CTA CHEST FINDINGS Cardiovascular: Satisfactory opacification of the bilateral pulmonary arteries to the segmental level. No evidence of pulmonary embolism. Although not tailored for evaluation of the thoracic aorta, there is no evidence of thoracic aortic aneurysm or dissection. The heart is normal in size.  No pericardial effusion. Mediastinum/Nodes: No suspicious mediastinal lymphadenopathy. Visualized thyroid is unremarkable. Lungs/Pleura: Mild bilateral lower lobe atelectasis. No focal consolidation. No suspicious pulmonary nodules. No pleural effusion or pneumothorax. Musculoskeletal: Scattered lytic lesions throughout the visualized axial and appendicular skeleton, most of which are grossly unchanged. However, there is a dominant lytic lesion in the T8 vertebral body with interval superior endplate pathologic fracture (sagittal image 82). Review of the MIP images confirms the above findings. CT ABDOMEN and PELVIS FINDINGS Hepatobiliary: Liver is within normal limits.  Gallbladder is unremarkable. No intrahepatic or extrahepatic duct dilatation. Pancreas: Within normal limits. Spleen: Within normal limits. Adrenals/Urinary Tract: Adrenal glands are within normal limits. Kidneys are within normal limits.  No hydronephrosis. Bladder is within normal limits. Stomach/Bowel: Stomach is within normal limits No evidence of bowel obstruction. Normal appendix (series 4/image 32). No colonic wall thickening or inflammatory changes. Vascular/Lymphatic: No evidence of abdominal aortic aneurysm. No suspicious abdominopelvic lymphadenopathy. Reproductive: Prostate is unremarkable. Other: No abdominopelvic ascites. Prior right inguinal hernia mesh repair. Musculoskeletal: Scattered lytic lesions throughout the visualized axial and appendicular skeleton, grossly unchanged. Review of the MIP images confirms the above findings. IMPRESSION: No evidence of pulmonary embolism. No acute findings in the abdomen/pelvis. Dominant lytic lesion in the T8 vertebral body with interval superior endplate pathologic fracture. Additional scattered lytic lesions throughout the visualized axial and appendicular skeleton in this patient with known multiple myeloma, otherwise grossly unchanged. Electronically Signed   By: SJulian HyM.D.   On: 12/24/2021 01:20   DG Chest 2 View  Result Date: 12/23/2021 CLINICAL DATA:  Short of breath EXAM: CHEST - 2 VIEW COMPARISON:  08/14/2021 FINDINGS: The heart size and mediastinal contours are within normal limits. Both lungs are clear. The visualized skeletal structures are unremarkable. IMPRESSION: No active cardiopulmonary disease. Electronically Signed   By: KDonavan FoilM.D.   On: 12/23/2021 20:21   MR Lumbar Spine W Wo Contrast  Result Date: 12/19/2021 CLINICAL DATA:  Low back pain with lower extremity weakness for 2 weeks. History of multiple myeloma EXAM: MRI LUMBAR SPINE WITHOUT AND WITH CONTRAST TECHNIQUE: Multiplanar and multiecho pulse sequences of  the lumbar spine were obtained without and with intravenous contrast. CONTRAST:  167mGADAVIST GADOBUTROL 1 MMOL/ML IV SOLN COMPARISON:  MRI 03/26/2021.  PET-CT 09/10/2021 FINDINGS: Segmentation:  Standard. Alignment:  Physiologic. Vertebrae: Multiple scattered enhancing marrow replacing bone lesions  within the imaged spine and pelvis including the T12, L1, and L2 vertebral bodies as well as lesions within the sacrum and bilateral posterior iliac bones. Lesions are non expansile. No pathologic fracture. No extraosseous soft tissue component. Generalized decreased T1 marrow signal throughout the bony structures may be treatment related. Vertebral body heights are maintained without fracture. No evidence of discitis. Conus medullaris and cauda equina: Conus extends to the T12-L1 level. Conus and cauda equina appear normal. Paraspinal and other soft tissues: Negative. Disc levels: T12-L1: Sagittal sequences only.  Unremarkable. L1-L2: Shallow left paracentral disc protrusion. No foraminal or canal stenosis. Unchanged. L2-L3: Annular disc bulge with impress upon the ventral thecal sac resulting in mild canal stenosis. No foraminal stenosis. Unchanged. L3-L4: Minimal annular disc bulge with impress upon the ventral thecal sac resulting in mild canal stenosis. No foraminal stenosis. Unchanged. L4-L5: Shallow central disc protrusion. No foraminal or canal stenosis. Unchanged. L5-S1: Left paracentral disc protrusion with annular fissure resulting in left greater than right subarticular recess stenosis without canal stenosis. Mild left foraminal stenosis. Unchanged. IMPRESSION: 1. Multiple scattered enhancing marrow replacing bone lesions within the imaged spine and pelvis consistent with known history of multiple myeloma. No pathologic fracture or extraosseous soft tissue component. 2. Unchanged mild multilevel degenerative changes of the lumbar spine resulting in mild canal stenosis at L2-L3 and L3-L4. 3. Left greater than  right subarticular recess stenosis and mild left foraminal stenosis at L5-S1. Electronically Signed   By: Davina Poke D.O.   On: 12/19/2021 10:07

## 2022-01-08 NOTE — Assessment & Plan Note (Addendum)
#  IgG lamda multiple myeloma On chemotherapy plan with lenalidomide (25 mg daily on days 1-14), subcutaneous bortezomib (1.3 mg/m2 on days 1, 4, 8, and 11), and oral dexamethasone (20 mg on days 1, 2, 8, 9, 15, and 16). SubQ daratumumab on days 1, 8, and 15 of cycles 1 through 4 and day 1 of  consolidation cycles (cycles 5 and 6).   Labs are reviewed and discussed with patient. VGPR M protein 0.4, today's level is pending.  Dara VRD.   I will proceed with Daratumumab maintenance. He is going to establish care with Dr. Tracey Harries. Will most likely get bone marrow biopsy at [facility], upcoming bone marrow transplant.  Continue  Aspirin 23m daily  #Hyperuricemia, continue allopurinol

## 2022-01-08 NOTE — Assessment & Plan Note (Signed)
Continue potassium supplementation 

## 2022-01-09 LAB — SAMPLE TO BLOOD BANK

## 2022-01-09 LAB — KAPPA/LAMBDA LIGHT CHAINS
Kappa free light chain: 9.8 mg/L (ref 3.3–19.4)
Kappa, lambda light chain ratio: 1.09 (ref 0.26–1.65)
Lambda free light chains: 9 mg/L (ref 5.7–26.3)

## 2022-01-12 ENCOUNTER — Ambulatory Visit: Payer: Commercial Managed Care - PPO

## 2022-01-12 ENCOUNTER — Inpatient Hospital Stay: Payer: Commercial Managed Care - PPO

## 2022-01-12 LAB — MULTIPLE MYELOMA PANEL, SERUM
Albumin SerPl Elph-Mcnc: 3.9 g/dL (ref 2.9–4.4)
Albumin/Glob SerPl: 1.5 (ref 0.7–1.7)
Alpha 1: 0.2 g/dL (ref 0.0–0.4)
Alpha2 Glob SerPl Elph-Mcnc: 0.8 g/dL (ref 0.4–1.0)
B-Globulin SerPl Elph-Mcnc: 1 g/dL (ref 0.7–1.3)
Gamma Glob SerPl Elph-Mcnc: 0.6 g/dL (ref 0.4–1.8)
Globulin, Total: 2.7 g/dL (ref 2.2–3.9)
IgA: 54 mg/dL — ABNORMAL LOW (ref 90–386)
IgG (Immunoglobin G), Serum: 732 mg/dL (ref 603–1613)
IgM (Immunoglobulin M), Srm: 35 mg/dL (ref 20–172)
M Protein SerPl Elph-Mcnc: 0.4 g/dL — ABNORMAL HIGH
Total Protein ELP: 6.6 g/dL (ref 6.0–8.5)

## 2022-01-15 ENCOUNTER — Inpatient Hospital Stay: Payer: Commercial Managed Care - PPO

## 2022-01-15 ENCOUNTER — Ambulatory Visit: Payer: Commercial Managed Care - PPO

## 2022-01-15 ENCOUNTER — Other Ambulatory Visit: Payer: Commercial Managed Care - PPO

## 2022-01-19 ENCOUNTER — Other Ambulatory Visit: Payer: Self-pay | Admitting: Oncology

## 2022-01-21 ENCOUNTER — Other Ambulatory Visit: Payer: Self-pay | Admitting: Oncology

## 2022-01-21 ENCOUNTER — Ambulatory Visit: Payer: Commercial Managed Care - PPO

## 2022-01-21 MED ORDER — TRAMADOL HCL 50 MG PO TABS: 50.0000 mg | ORAL_TABLET | Freq: Four times a day (QID) | ORAL | 0 refills | Status: DC | PRN

## 2022-01-26 ENCOUNTER — Ambulatory Visit: Payer: Commercial Managed Care - PPO | Admitting: Radiation Oncology

## 2022-02-04 ENCOUNTER — Telehealth: Payer: Self-pay | Admitting: *Deleted

## 2022-02-04 ENCOUNTER — Ambulatory Visit: Payer: Commercial Managed Care - PPO

## 2022-02-04 ENCOUNTER — Other Ambulatory Visit: Payer: Self-pay | Admitting: *Deleted

## 2022-02-04 DIAGNOSIS — C9000 Multiple myeloma not having achieved remission: Secondary | ICD-10-CM

## 2022-02-04 DIAGNOSIS — R110 Nausea: Secondary | ICD-10-CM

## 2022-02-04 NOTE — Telephone Encounter (Signed)
Patient called reporting that he is no longer getting chemotherapy treatment in preparation for BMT. Looks like his last treatment was 01/08/22. He had his initial evaluate for BMT and is scheduled to go for lab and imaging on 2/7. He was told that he won't get transplant until March. His problem niw is that he is having constant nausea not completely controlled by prochlorperazine and anorexia. He states tha he is surviving on Ensure drinks. When he tries to eat, he finds that he gets more nauseated and puts food down. He denies constipation or diarrhea.  He currently has no follow up appointment with Dr Tasia Catchings. Please advise

## 2022-02-04 NOTE — Telephone Encounter (Signed)
Spoke with patient. He is clinically stable. Has not vomited since last week, but stays persistently nauseated. Pt has poor appetite. He denies any other symptoms. No fevers, no diarrhea.  Patient offered an apt in [place] today but pt declined as he does not have transportation today. His mom brings him to all apts. Pt accepted an apt tomorrow with Merrily Pew, NP at 830 for labs and smc visits following.

## 2022-02-05 ENCOUNTER — Inpatient Hospital Stay: Payer: Commercial Managed Care - PPO | Attending: Oncology

## 2022-02-05 ENCOUNTER — Inpatient Hospital Stay: Payer: Commercial Managed Care - PPO

## 2022-02-05 ENCOUNTER — Encounter: Payer: Self-pay | Admitting: Hospice and Palliative Medicine

## 2022-02-05 ENCOUNTER — Inpatient Hospital Stay (HOSPITAL_BASED_OUTPATIENT_CLINIC_OR_DEPARTMENT_OTHER): Payer: Commercial Managed Care - PPO | Admitting: Hospice and Palliative Medicine

## 2022-02-05 ENCOUNTER — Other Ambulatory Visit: Payer: Self-pay

## 2022-02-05 VITALS — BP 113/70 | HR 115 | Temp 97.2°F | Resp 20 | Ht 72.0 in | Wt 220.0 lb

## 2022-02-05 DIAGNOSIS — C9000 Multiple myeloma not having achieved remission: Secondary | ICD-10-CM | POA: Diagnosis present

## 2022-02-05 DIAGNOSIS — R110 Nausea: Secondary | ICD-10-CM

## 2022-02-05 DIAGNOSIS — F5104 Psychophysiologic insomnia: Secondary | ICD-10-CM | POA: Diagnosis not present

## 2022-02-05 LAB — CBC WITH DIFFERENTIAL/PLATELET
Abs Immature Granulocytes: 0.01 10*3/uL (ref 0.00–0.07)
Basophils Absolute: 0 10*3/uL (ref 0.0–0.1)
Basophils Relative: 1 %
Eosinophils Absolute: 0.4 10*3/uL (ref 0.0–0.5)
Eosinophils Relative: 8 %
HCT: 40.3 % (ref 39.0–52.0)
Hemoglobin: 13.5 g/dL (ref 13.0–17.0)
Immature Granulocytes: 0 %
Lymphocytes Relative: 25 %
Lymphs Abs: 1.3 10*3/uL (ref 0.7–4.0)
MCH: 25.8 pg — ABNORMAL LOW (ref 26.0–34.0)
MCHC: 33.5 g/dL (ref 30.0–36.0)
MCV: 77.1 fL — ABNORMAL LOW (ref 80.0–100.0)
Monocytes Absolute: 0.6 10*3/uL (ref 0.1–1.0)
Monocytes Relative: 11 %
Neutro Abs: 2.9 10*3/uL (ref 1.7–7.7)
Neutrophils Relative %: 55 %
Platelets: 396 10*3/uL (ref 150–400)
RBC: 5.23 MIL/uL (ref 4.22–5.81)
RDW: 14 % (ref 11.5–15.5)
WBC: 5.2 10*3/uL (ref 4.0–10.5)
nRBC: 0 % (ref 0.0–0.2)

## 2022-02-05 LAB — COMPREHENSIVE METABOLIC PANEL
ALT: 40 U/L (ref 0–44)
AST: 23 U/L (ref 15–41)
Albumin: 4.3 g/dL (ref 3.5–5.0)
Alkaline Phosphatase: 101 U/L (ref 38–126)
Anion gap: 12 (ref 5–15)
BUN: 15 mg/dL (ref 6–20)
CO2: 27 mmol/L (ref 22–32)
Calcium: 9.7 mg/dL (ref 8.9–10.3)
Chloride: 96 mmol/L — ABNORMAL LOW (ref 98–111)
Creatinine, Ser: 0.92 mg/dL (ref 0.61–1.24)
GFR, Estimated: >60 mL/min (ref >60)
Glucose, Bld: 107 mg/dL — ABNORMAL HIGH (ref 70–99)
Potassium: 4.2 mmol/L (ref 3.5–5.1)
Sodium: 135 mmol/L (ref 135–145)
Total Bilirubin: 0.6 mg/dL (ref 0.3–1.2)
Total Protein: 7.7 g/dL (ref 6.5–8.1)

## 2022-02-05 LAB — MAGNESIUM: Magnesium: 2 mg/dL (ref 1.7–2.4)

## 2022-02-05 MED ORDER — OLANZAPINE 5 MG PO TABS: 5.0000 mg | ORAL_TABLET | Freq: Every day | ORAL | 1 refills | Status: DC

## 2022-02-05 MED ORDER — ONDANSETRON HCL 8 MG PO TABS: 8.0000 mg | ORAL_TABLET | Freq: Two times a day (BID) | ORAL | 2 refills | Status: AC | PRN

## 2022-02-05 NOTE — Progress Notes (Signed)
Symptom Management [place] at [facility] [phone] [phone]  Patient Care Team: Pcp, No as PCP - General   NAME OF PATIENT: Tanner [name]  [id number]  04-Oct-1985   DATE OF VISIT: 02/05/22  REASON FOR CONSULT: Tanner [name] is a 37 y.o. male with multiple medical problems including IgG lambda multiple myeloma.  Patient is on chemotherapy with Dara VRD.  INTERVAL HISTORY: Patient saw Dr. Tasia Catchings on 01/08/2022 with plan to proceed with Tamera Stands Mab maintenance the patient was referred to [facility] for upcoming marrow transplant.  Patient presents today to [facility] for evaluation of nausea and poor appetite.  Patient reports several weeks of nausea without vomiting and reduced oral intake.  He reports chronic insomnia.  He is taking ondansetron intermittently, which he reports helps.  He is not using the prochlorperazine.    He denies other symptomatic complaints or concerns.  No fever or chills. Denies any neurologic complaints. Denies recent fevers or illnesses. Denies any easy bleeding or bruising. Denies chest pain. Denies any , vomiting, constipation, or diarrhea. Denies urinary complaints. Patient offers no further specific complaints today.   PAST MEDICAL HISTORY: Past Medical History:  Diagnosis Date   Alcohol use    Sciatica    Smoking     PAST SURGICAL HISTORY:  Past Surgical History:  Procedure Laterality Date   INGUINAL HERNIA REPAIR      HEMATOLOGY/ONCOLOGY HISTORY:  Oncology History  Multiple myeloma not having achieved remission ([place])  08/14/2021 Imaging   CT chest abdomen pelvis without contrast Showed numerous lucencies throughout the bone structures, most pronounced throughout the spine and left left iliac bone.  Otherwise no acute findings in the chest abdomen or pelvis.  Sigmoid diverticulosis.   08/19/2021 Initial Diagnosis   Multiple myeloma not having achieved remission, stage  II  -08/15/2021, bone marrow biopsy showed plasma cell myeloma involving greater than 80% of the marrow. Cytogenetics Myeloma  FISH   08/20/2021 Cancer Staging   Staging form: Plasma Cell Myeloma and Plasma Cell Disorders, AJCC 8th Edition - Clinical stage from 08/20/2021: RISS Stage II (Beta-2-microglobulin (mg/L): 3.6, Albumin (g/dL): 2.2, ISS: Stage II, High-risk cytogenetics: Absent, LDH: Normal) - Signed by [name], MD on 10/14/2021 Beta 2 microglobulin range (mg/L): 3.5 to 5.49 Albumin range (g/dL): Less than 3.5 Cytogenetics: No abnormalities   08/20/2021 -  Chemotherapy   MYELOMA NEWLY DIAGNOSED TRANSPLANT CANDIDATE DaraVRd (Daratumumab SUBQ) q21d x 6 Cycles (Induction/Consolidation)      09/11/2021 Imaging   PET scan showed  1. Extensive multifocal hypermetabolic disease throughout the axial and appendicular skeleton, with numerous lucent osseous lesions,consistent with patient's known diagnosis of multiple myeloma. 2. No tracer avid lymph node or mass identified to suggest soft tissue plasmacytoma   12/18/2021 Imaging   MRI lumbar 1. Multiple scattered enhancing marrow replacing bone lesions within the imaged spine and pelvis consistent with known history of multiple myeloma. No pathologic fracture or extraosseous soft tissue component. 2. Unchanged mild multilevel degenerative changes of the lumbar spine resulting in mild canal stenosis at L2-L3 and L3-L4.  3. Left greater than right subarticular recess stenosis and mild left foraminal stenosis at L5-S1.        ALLERGIES:  has No Known Allergies.  MEDICATIONS:  Current Outpatient Medications  Medication Sig Dispense Refill   acyclovir (ZOVIRAX) 400 MG tablet Take 1 tablet (400 mg total) by mouth 2 (two) times daily. 60 tablet 5   allopurinol (ZYLOPRIM) 100 MG tablet Take 0.5  tablets (50 mg total) by mouth daily. 30 tablet 2   aspirin EC 81 MG tablet Take 81 mg by mouth daily. Swallow whole.     KLOR-CON M20 20 MEQ tablet  TAKE 1 TABLET BY MOUTH EVERY DAY 30 tablet 0   ondansetron (ZOFRAN) 8 MG tablet Take 1 tablet (8 mg total) by mouth 2 (two) times daily as needed for nausea or vomiting. 20 tablet 2   traMADol (ULTRAM) 50 MG tablet Take 1 tablet (50 mg total) by mouth every 6 (six) hours as needed. 30 tablet 0   dexamethasone (DECADRON) 4 MG tablet Take 5 tablets (20 mg total) by mouth See admin instructions. take dexamethasone 20 mg, day 2, 9,16. (Patient not taking: Reported on 02/05/2022) 60 tablet 0   prochlorperazine (COMPAZINE) 10 MG tablet Take 1 tablet (10 mg total) by mouth every 6 (six) hours as needed for nausea or vomiting. (Patient not taking: Reported on 10/28/2021) 30 tablet 2   No current facility-administered medications for this visit.    VITAL SIGNS: BP 113/70   Pulse (!) 115   Temp (!) 97.2 F (36.2 C) (Tympanic)   Resp 20   Ht 6' (1.829 m)   Wt 220 lb (99.8 kg)   BMI 29.84 kg/m  Filed Weights   02/05/22 0838  Weight: 220 lb (99.8 kg)    Estimated body mass index is 29.84 kg/m as calculated from the following:   Height as of this encounter: 6' (1.829 m).   Weight as of this encounter: 220 lb (99.8 kg).  LABS: CBC:    Component Value Date/Time   WBC 5.2 02/05/2022 0824   HGB 13.5 02/05/2022 0824   HGB 10.9 (L) 08/17/2021 1412   HCT 40.3 02/05/2022 0824   HCT 42.3 10/12/2011 1207   PLT 396 02/05/2022 0824   PLT 256 10/12/2011 1207   MCV 77.1 (L) 02/05/2022 0824   MCV 76 (L) 10/12/2011 1207   NEUTROABS 2.9 02/05/2022 0824   NEUTROABS 2.9 10/12/2011 1207   LYMPHSABS 1.3 02/05/2022 0824   LYMPHSABS 2.4 10/12/2011 1207   MONOABS 0.6 02/05/2022 0824   MONOABS 0.7 10/12/2011 1207   EOSABS 0.4 02/05/2022 0824   EOSABS 0.2 10/12/2011 1207   BASOSABS 0.0 02/05/2022 0824   BASOSABS 0.0 10/12/2011 1207   Comprehensive Metabolic Panel:    Component Value Date/Time   NA 135 02/05/2022 0824   NA 140 10/12/2011 1207   K 4.2 02/05/2022 0824   K 3.6 10/13/2011 0953   CL 96  (L) 02/05/2022 0824   CL 104 10/12/2011 1207   CO2 27 02/05/2022 0824   CO2 28 10/12/2011 1207   BUN 15 02/05/2022 0824   BUN 6 (L) 10/12/2011 1207   CREATININE 0.92 02/05/2022 0824   CREATININE 0.86 10/12/2011 1207   GLUCOSE 107 (H) 02/05/2022 0824   GLUCOSE 92 10/12/2011 1207   CALCIUM 9.7 02/05/2022 0824   CALCIUM 8.7 10/12/2011 1207   AST 23 02/05/2022 0824   ALT 40 02/05/2022 0824   ALKPHOS 101 02/05/2022 0824   BILITOT 0.6 02/05/2022 0824   PROT 7.7 02/05/2022 0824   ALBUMIN 4.3 02/05/2022 0824    RADIOGRAPHIC STUDIES: No results found.  PERFORMANCE STATUS (ECOG) : 1 - Symptomatic but completely ambulatory  Review of Systems Unless otherwise noted, a complete review of systems is negative.  Physical Exam General: NAD Cardiovascular: regular rate and rhythm Pulmonary: clear anterior/posterior fields Abdomen: soft, nontender, + bowel sounds GU: no suprapubic tenderness Extremities: no edema, no  joint deformities Skin: no rashes Neurological: Grossly nonfocal  IMPRESSION/PLAN: Nausea/poor oral intake -unclear etiology as patient has been off treatment now for 2 weeks.  Labs unremarkable and unchanged from baseline.  Exam is benign.  Patient reports that he is having improvement when he takes ondansetron but he is not taking this regularly.  He is not using the prochlorperazine.  Discussed antiemetic regimen in detail.  Will discontinue prochlorperazine and start olanzapine at bedtime as hopefully this will also help with appetite and sleep. Referral back to nutrition.   Case and plan discussed with Dr. Tasia Catchings   Patient expressed understanding and was in agreement with this plan. He also understands that He can call clinic at any time with any questions, concerns, or complaints.   Thank you for allowing me to participate in the care of this very pleasant patient.   Time Total: 20 minutes  Visit consisted of counseling and education dealing with the complex and  emotionally intense issues of symptom management in the setting of serious illness.Greater than 50%  of this time was spent counseling and coordinating care related to the above assessment and plan.  Signed by: Altha Harm, PhD, NP-C

## 2022-02-06 ENCOUNTER — Other Ambulatory Visit: Payer: Self-pay

## 2022-02-11 ENCOUNTER — Other Ambulatory Visit: Payer: Self-pay

## 2022-02-11 DIAGNOSIS — R7689 Other specified abnormal immunological findings in serum: Secondary | ICD-10-CM | POA: Insufficient documentation

## 2022-02-12 ENCOUNTER — Telehealth: Payer: Self-pay

## 2022-02-12 ENCOUNTER — Inpatient Hospital Stay: Payer: Commercial Managed Care - PPO

## 2022-02-12 MED ORDER — SENNOSIDES-DOCUSATE SODIUM 8.6-50 MG PO TABS: 1.0000 | ORAL_TABLET | Freq: Every day | ORAL | 2 refills | Status: AC

## 2022-02-12 NOTE — Telephone Encounter (Signed)
Per Joli A, pt reported constipation and urge to have a bowel movement but unable to have one. Per MD, he may take senokot BID and OTC Miralax once a day. Rx for senokot sent to pharmacy. Pt informed and verbalized understanding.

## 2022-02-12 NOTE — Progress Notes (Signed)
Nutrition Follow-up:  Patient with multiple myeloma.  Last chemotherapy on 01/08/22.  Planning stem cell transplant at [facility] in March.   Spoke with patient via phone.  Reports that he is having issues with constipation. Last bowel movement was 3 days ago with small piece of stool.  Feels the urge to pass stool but when sits on the toilet unable to have a bowel movement.  Has not taken over the counter medication to help with constipation.  Says that nausea is better and sleep is better.  Still having some issues with gagging on foods when eating. Recently at [employer] and gagged. Had similar experience with macaroni and cheese.  He is taking zofran along with olanzapine.  Has been drinking ensure BID, along with water, juice, gatorade.     Medications: zofran, zyprexa,  Labs: no new  Anthropometrics:   Weight on 2/1 220 lb Noted weight at [facility] 217 lb 9.5 oz on 2/7 per chart 237 lb on 1/4 235 lb on 10/24  7% weight loss in the last month  NUTRITION DIAGNOSIS: Inadequate oral intake related to altered GI function (nausea) as evidenced by 7% weight loss in the last month and decreased intake   INTERVENTION:  Encouraged taking antinausea medication Discussed lack of bowel movement/constipation and no medication taken to help with RN, Tanner [name].  She will call and follow-up with patient.   Discussed strategies to help with nausea and foods to choose.  Will email handout ([email or web address]) Encouraged ensure 2-3 times per day if able    MONITORING, EVALUATION, GOAL: weight trends, intake   NEXT VISIT: phone call Feb 22nd Informed patient of follow-up call  Tanner [name], [place], [place] Registered Dietitian [phone]

## 2022-02-13 ENCOUNTER — Telehealth: Payer: Self-pay | Admitting: *Deleted

## 2022-02-13 ENCOUNTER — Other Ambulatory Visit: Payer: Self-pay

## 2022-02-13 NOTE — Telephone Encounter (Signed)
Heather from Dr Mertha Finders office called stating that Dr Tracey Harries wants to push patient BMT out a week and that patient needs a PET scan done before the transplant, but they are overbooked at [facility] and cannot get him in in that time frame so are asking if we can get it done here next week or the week of the 19th. I have asked Nira Conn to please fax [place] an order for exactly what they want and why she will fax order to [place]

## 2022-02-16 ENCOUNTER — Other Ambulatory Visit: Payer: Self-pay

## 2022-02-16 ENCOUNTER — Encounter: Payer: Self-pay | Admitting: Oncology

## 2022-02-16 ENCOUNTER — Telehealth: Payer: Self-pay

## 2022-02-16 DIAGNOSIS — C9000 Multiple myeloma not having achieved remission: Secondary | ICD-10-CM

## 2022-02-16 NOTE — Telephone Encounter (Signed)
Error

## 2022-02-16 NOTE — Telephone Encounter (Signed)
Heather at [facility] notified of PET appointment here at [facility]

## 2022-02-16 NOTE — Telephone Encounter (Signed)
Dr. Tracey Harries' office requesting for pt to have PET here at [facility].   Please schedule PET scan, this week or next and inform pt of appt.

## 2022-02-24 ENCOUNTER — Ambulatory Visit
Admission: RE | Admit: 2022-02-24 | Discharge: 2022-02-24 | Disposition: A | Discharge status: Home / Self Care | Payer: Commercial Managed Care - PPO | Source: Ambulatory Visit | Attending: Oncology | Admitting: Oncology

## 2022-02-24 DIAGNOSIS — J351 Hypertrophy of tonsils: Secondary | ICD-10-CM | POA: Diagnosis not present

## 2022-02-24 DIAGNOSIS — C9000 Multiple myeloma not having achieved remission: Secondary | ICD-10-CM | POA: Diagnosis not present

## 2022-02-24 DIAGNOSIS — R590 Localized enlarged lymph nodes: Secondary | ICD-10-CM | POA: Diagnosis not present

## 2022-02-24 LAB — GLUCOSE, CAPILLARY: Glucose-Capillary: 107 mg/dL — ABNORMAL HIGH (ref 70–99)

## 2022-02-24 MED ORDER — FLUDEOXYGLUCOSE F - 18 (FDG) INJECTION
10.9000 | Freq: Once | INTRAVENOUS | Status: AC
  Administered 2022-02-24: 11.32 via INTRAVENOUS

## 2022-02-25 ENCOUNTER — Other Ambulatory Visit: Payer: Self-pay | Admitting: Oncology

## 2022-02-25 NOTE — Telephone Encounter (Signed)
Comprehensive Metabolic Panel (CMP) Order: [id number]  Ref Range & Units 5 d ago  Potassium 3.5 - 5.0 mmol/L 3.6

## 2022-02-26 ENCOUNTER — Inpatient Hospital Stay: Payer: Commercial Managed Care - PPO

## 2022-02-26 NOTE — Progress Notes (Signed)
Nutrition  Called for nutrition follow-up.  No answer.  Left message with call back number.  Haidee Stogsdill B. Zenia Resides, [place], [place] Registered Dietitian [phone]

## 2022-03-02 ENCOUNTER — Other Ambulatory Visit: Payer: Self-pay

## 2022-03-04 ENCOUNTER — Encounter: Payer: Self-pay | Admitting: Oncology

## 2022-03-04 ENCOUNTER — Other Ambulatory Visit: Payer: Self-pay | Admitting: Oncology

## 2022-03-04 ENCOUNTER — Other Ambulatory Visit: Payer: Self-pay | Admitting: Pharmacist

## 2022-03-04 ENCOUNTER — Telehealth: Payer: Self-pay

## 2022-03-04 ENCOUNTER — Other Ambulatory Visit (HOSPITAL_COMMUNITY): Payer: Self-pay

## 2022-03-04 DIAGNOSIS — C9000 Multiple myeloma not having achieved remission: Secondary | ICD-10-CM

## 2022-03-04 MED ORDER — LENALIDOMIDE 25 MG PO CAPS: 25.0000 mg | ORAL_CAPSULE | Freq: Every day | ORAL | 0 refills | Status: DC

## 2022-03-04 NOTE — Telephone Encounter (Addendum)
Oral Oncology Patient Advocate Encounter  After completing a benefits investigation, prior authorization for Lenalidomide is not required at this time through [facility].  Prior Authorization on file from August of 2023 is good through August of 2024.   Patient must fill through [place] per insurance.     Berdine Addison, [facility] Patient [place]  [phone] (phone) [phone] (fax) 03/04/2022 12:52 PM

## 2022-03-05 ENCOUNTER — Other Ambulatory Visit: Payer: Self-pay

## 2022-03-11 ENCOUNTER — Other Ambulatory Visit: Payer: Self-pay

## 2022-03-13 ENCOUNTER — Telehealth: Payer: Self-pay | Admitting: *Deleted

## 2022-03-13 ENCOUNTER — Telehealth: Payer: Self-pay | Admitting: Oncology

## 2022-03-13 ENCOUNTER — Other Ambulatory Visit: Payer: Self-pay | Admitting: Oncology

## 2022-03-13 NOTE — Telephone Encounter (Signed)
I called back and told pt that they were able to get appt for 3/18 at 9 am labs then see md and then get treatment. He is great with that

## 2022-03-13 NOTE — Telephone Encounter (Signed)
spoke with pt and notified him of new appts dates and time

## 2022-03-13 NOTE — Telephone Encounter (Signed)
got a message from DR Tracey Harries about the pt. He is suppose to start back with velcade, dex, and daratumamab 3/12. he had a tooth extraction and Dr. Tracey Harries wanted to move the treatment out 1 more week with would be 3/19. they want to make sure you are ok with that and if you need written letter to wait to 3/19 or call Tracey Harries for questions. let me know  Dr. Tasia Catchings sent mea message that we will start 3/19 and I called Heather to let her know as well called pt too.When I called the pt. He would like to have the appt on 3/18 Monday because his mother is off and can come with him.I have snet a message back to [place] and scheduler to see if that can be done

## 2022-03-17 ENCOUNTER — Inpatient Hospital Stay: Payer: Commercial Managed Care - PPO

## 2022-03-17 ENCOUNTER — Inpatient Hospital Stay: Payer: Commercial Managed Care - PPO | Admitting: Oncology

## 2022-03-20 ENCOUNTER — Ambulatory Visit: Payer: Commercial Managed Care - PPO

## 2022-03-23 ENCOUNTER — Inpatient Hospital Stay: Payer: Commercial Managed Care - PPO | Attending: Oncology

## 2022-03-23 ENCOUNTER — Other Ambulatory Visit: Payer: Self-pay

## 2022-03-23 ENCOUNTER — Inpatient Hospital Stay: Payer: Commercial Managed Care - PPO

## 2022-03-23 ENCOUNTER — Inpatient Hospital Stay (HOSPITAL_BASED_OUTPATIENT_CLINIC_OR_DEPARTMENT_OTHER): Payer: Commercial Managed Care - PPO | Admitting: Oncology

## 2022-03-23 ENCOUNTER — Encounter: Payer: Self-pay | Admitting: Oncology

## 2022-03-23 VITALS — BP 100/76 | HR 107 | Temp 97.5°F | Resp 18 | Wt 205.9 lb

## 2022-03-23 DIAGNOSIS — Z5111 Encounter for antineoplastic chemotherapy: Secondary | ICD-10-CM

## 2022-03-23 DIAGNOSIS — E876 Hypokalemia: Secondary | ICD-10-CM | POA: Diagnosis not present

## 2022-03-23 DIAGNOSIS — D509 Iron deficiency anemia, unspecified: Secondary | ICD-10-CM

## 2022-03-23 DIAGNOSIS — C9000 Multiple myeloma not having achieved remission: Secondary | ICD-10-CM | POA: Diagnosis not present

## 2022-03-23 DIAGNOSIS — Z5112 Encounter for antineoplastic immunotherapy: Secondary | ICD-10-CM | POA: Insufficient documentation

## 2022-03-23 DIAGNOSIS — C7951 Secondary malignant neoplasm of bone: Secondary | ICD-10-CM

## 2022-03-23 LAB — CBC WITH DIFFERENTIAL/PLATELET
Abs Immature Granulocytes: 0 10*3/uL (ref 0.00–0.07)
Basophils Absolute: 0 10*3/uL (ref 0.0–0.1)
Basophils Relative: 0 %
Eosinophils Absolute: 0.2 10*3/uL (ref 0.0–0.5)
Eosinophils Relative: 7 %
HCT: 30.8 % — ABNORMAL LOW (ref 39.0–52.0)
Hemoglobin: 10.3 g/dL — ABNORMAL LOW (ref 13.0–17.0)
Immature Granulocytes: 0 %
Lymphocytes Relative: 35 %
Lymphs Abs: 1.1 10*3/uL (ref 0.7–4.0)
MCH: 25.9 pg — ABNORMAL LOW (ref 26.0–34.0)
MCHC: 33.4 g/dL (ref 30.0–36.0)
MCV: 77.6 fL — ABNORMAL LOW (ref 80.0–100.0)
Monocytes Absolute: 0.5 10*3/uL (ref 0.1–1.0)
Monocytes Relative: 15 %
Neutro Abs: 1.3 10*3/uL — ABNORMAL LOW (ref 1.7–7.7)
Neutrophils Relative %: 43 %
Platelets: 313 10*3/uL (ref 150–400)
RBC: 3.97 MIL/uL — ABNORMAL LOW (ref 4.22–5.81)
RDW: 15.8 % — ABNORMAL HIGH (ref 11.5–15.5)
WBC: 3 10*3/uL — ABNORMAL LOW (ref 4.0–10.5)
nRBC: 0 % (ref 0.0–0.2)

## 2022-03-23 LAB — COMPREHENSIVE METABOLIC PANEL
ALT: 27 U/L (ref 0–44)
AST: 16 U/L (ref 15–41)
Albumin: 4 g/dL (ref 3.5–5.0)
Alkaline Phosphatase: 82 U/L (ref 38–126)
Anion gap: 10 (ref 5–15)
BUN: 8 mg/dL (ref 6–20)
CO2: 24 mmol/L (ref 22–32)
Calcium: 9.1 mg/dL (ref 8.9–10.3)
Chloride: 103 mmol/L (ref 98–111)
Creatinine, Ser: 0.91 mg/dL (ref 0.61–1.24)
GFR, Estimated: >60 mL/min (ref >60)
Glucose, Bld: 99 mg/dL (ref 70–99)
Potassium: 3.8 mmol/L (ref 3.5–5.1)
Sodium: 137 mmol/L (ref 135–145)
Total Bilirubin: 0.9 mg/dL (ref 0.3–1.2)
Total Protein: 6.9 g/dL (ref 6.5–8.1)

## 2022-03-23 LAB — IRON AND TIBC
Iron: 107 ug/dL (ref 45–182)
Saturation Ratios: 34 % (ref 17.9–39.5)
TIBC: 318 ug/dL (ref 250–450)
UIBC: 211 ug/dL

## 2022-03-23 LAB — FERRITIN: Ferritin: 567 ng/mL — ABNORMAL HIGH (ref 24–336)

## 2022-03-23 MED ORDER — DARATUMUMAB-HYALURONIDASE-FIHJ 1800-30000 MG-UT/15ML ~~LOC~~ SOLN
1800.0000 mg | Freq: Once | SUBCUTANEOUS | Status: AC
  Administered 2022-03-23: 1800 mg via SUBCUTANEOUS
  Filled 2022-03-23: qty 15

## 2022-03-23 MED ORDER — DEXAMETHASONE 4 MG PO TABS
20.0000 mg | ORAL_TABLET | Freq: Once | ORAL | Status: AC
  Administered 2022-03-23: 20 mg via ORAL
  Filled 2022-03-23: qty 5

## 2022-03-23 MED ORDER — DIPHENHYDRAMINE HCL 25 MG PO CAPS
50.0000 mg | ORAL_CAPSULE | Freq: Once | ORAL | Status: AC
  Administered 2022-03-23: 50 mg via ORAL
  Filled 2022-03-23: qty 2

## 2022-03-23 MED ORDER — MONTELUKAST SODIUM 10 MG PO TABS
10.0000 mg | ORAL_TABLET | Freq: Once | ORAL | Status: AC
  Administered 2022-03-23: 10 mg via ORAL
  Filled 2022-03-23: qty 1

## 2022-03-23 MED ORDER — SODIUM CHLORIDE 0.9 % IV SOLN
Freq: Once | INTRAVENOUS | Status: DC
  Filled 2022-03-23: qty 250

## 2022-03-23 MED ORDER — ACETAMINOPHEN 325 MG PO TABS
650.0000 mg | ORAL_TABLET | Freq: Once | ORAL | Status: AC
  Administered 2022-03-23: 650 mg via ORAL
  Filled 2022-03-23: qty 2

## 2022-03-23 MED ORDER — BORTEZOMIB CHEMO SQ INJECTION 3.5 MG (2.5MG/ML)
1.3000 mg/m2 | Freq: Once | INTRAMUSCULAR | Status: AC
  Administered 2022-03-23: 3 mg via SUBCUTANEOUS
  Filled 2022-03-23: qty 1.2

## 2022-03-23 NOTE — Assessment & Plan Note (Addendum)
#   IgG lamda multiple myeloma On chemotherapy plan with lenalidomide (25 mg daily on days 1-14), subcutaneous bortezomib (1.3 mg/m2 on days 1, 4, 8, and 11), and oral dexamethasone (20 mg on days 1, 2, 8, 9, 15, and 16). SubQ daratumumab on days 1, 8, and 15 of cycles 1 through 4 and day 1 of  consolidation cycles (cycles 5 and 6).   Labs are reviewed and discussed with patient. 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 Proceed with Dara VRD-to bridge him to bone marrow biopsy. Continue  Aspirin 81mg  daily  #Hyperuricemia, continue allopurinol

## 2022-03-23 NOTE — Assessment & Plan Note (Signed)
Continue potassium supplementation 

## 2022-03-23 NOTE — Progress Notes (Addendum)
Hematology/Oncology Progress note [phone] Fax:([phone]     CHIEF COMPLAINTS/PURPOSE OF CONSULTATION:  Multiple myeloma  ASSESSMENT & PLAN:   Cancer Staging  Multiple myeloma not having achieved remission ([place]) Staging form: Plasma Cell Myeloma and Plasma Cell Disorders, AJCC 8th Edition - Clinical stage from 08/20/2021: RISS Stage II (Beta-2-microglobulin (mg/L): 3.6, Albumin (g/dL): 2.2, ISS: Stage II, High-risk cytogenetics: Absent, LDH: Normal) - Signed by [name], MD on 10/14/2021   Multiple myeloma not having achieved remission ([place]) # IgG lamda multiple myeloma On chemotherapy plan with lenalidomide (25 mg daily on days 1-14), subcutaneous bortezomib (1.3 mg/m2 on days 1, 4, 8, and 11), and oral dexamethasone (20 mg on days 1, 2, 8, 9, 15, and 16). SubQ daratumumab on days 1, 8, and 15 of cycles 1 through 4 and day 1 of  consolidation cycles (cycles 5 and 6).   Labs are reviewed and discussed with patient. 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 Proceed with Dara VRD-to bridge him to bone marrow biopsy. Continue  Aspirin 81mg  daily  #Hyperuricemia, continue allopurinol  Encounter for antineoplastic chemotherapy Chemotherapy plan as listed above.   Hypokalemia Continue potassium supplementation.   Metastatic cancer to bone [facility]) PET scan showed multiple osseous lesions.  Previously discussed with neurosurgeon and radiation oncology.  No intervention for now. Awaiting dental clearance for bone strengthening agents-status post wisdom teeth extraction.    Microcytic anemia Iron panel showed ferritin of 567.  Iron saturation normal.  Not typical iron deficiency. Likely anemia secondary to chronic disease.  Check reticulocyte panel.    Recommend patient to obtain dental clearance  Follow up  Per LOS.  All questions were answered. The patient knows to call the clinic with any problems, questions or concerns. No barriers to learning  was detected.  [name], MD 03/23/2022    HISTORY OF PRESENTING ILLNESS:  Tanner [name] 37 y.o. male presents  for follow up of Multiple myeloma I have reviewed his chart and materials related to his cancer extensively and collaborated history with the patient. Summary of oncologic history is as follows: Oncology History  Multiple myeloma not having achieved remission ([place])  08/14/2021 Imaging   CT chest abdomen pelvis without contrast Showed numerous lucencies throughout the bone structures, most pronounced throughout the spine and left left iliac bone.  Otherwise no acute findings in the chest abdomen or pelvis.  Sigmoid diverticulosis.   08/19/2021 Initial Diagnosis   Multiple myeloma not having achieved remission, stage II  -08/15/2021, bone marrow biopsy showed plasma cell myeloma involving greater than 80% of the marrow. Cytogenetics Myeloma  FISH   08/20/2021 Cancer Staging   Staging form: Plasma Cell Myeloma and Plasma Cell Disorders, AJCC 8th Edition - Clinical stage from 08/20/2021: RISS Stage II (Beta-2-microglobulin (mg/L): 3.6, Albumin (g/dL): 2.2, ISS: Stage II, High-risk cytogenetics: Absent, LDH: Normal) - Signed by [name], MD on 10/14/2021 Beta 2 microglobulin range (mg/L): 3.5 to 5.49 Albumin range (g/dL): Less than 3.5 Cytogenetics: No abnormalities   08/20/2021 -  Chemotherapy   MYELOMA NEWLY DIAGNOSED TRANSPLANT CANDIDATE DaraVRd (Daratumumab SUBQ) q21d x 6 Cycles (Induction/Consolidation)      09/11/2021 Imaging   PET scan showed  1. Extensive multifocal hypermetabolic disease throughout the axial and appendicular skeleton, with numerous lucent osseous lesions,consistent with patient's known diagnosis of multiple myeloma. 2. No tracer avid lymph node or mass identified to suggest soft tissue plasmacytoma   12/18/2021 Imaging   MRI lumbar 1. Multiple scattered enhancing marrow  replacing bone lesions within the imaged spine and pelvis consistent with  known history of multiple myeloma. No pathologic fracture or extraosseous soft tissue component. 2. Unchanged mild multilevel degenerative changes of the lumbar spine resulting in mild canal stenosis at L2-L3 and L3-L4.  3. Left greater than right subarticular recess stenosis and mild left foraminal stenosis at L5-S1.        INTERVAL HISTORY Arhum Cayabyab is a 36 y.o. male who has above history reviewed by me today presents for follow up visit for management of multiple myeloma Denies any neuropathy symptoms.  Patient is accompanied by mother.  During interval, patient has been evaluated by pulmonary transplant team at [facility]  He has had wisdom teeth extraction last week.  He also had seen cardiologist for pulmonary transplant evaluation. Patient presents for another cycle of chemotherapy bridging him to bone marrow transplant.   MEDICAL HISTORY:  Past Medical History:  Diagnosis Date   Alcohol use    Sciatica    Smoking     SURGICAL HISTORY: Past Surgical History:  Procedure Laterality Date   INGUINAL HERNIA REPAIR      SOCIAL HISTORY: Social History   Socioeconomic History   Marital status: Single    Spouse name: Not on file   Number of children: Not on file   Years of education: Not on file   Highest education level: Not on file  Occupational History   Not on file  Tobacco Use   Smoking status: Former    Packs/day: 0.50    Years: 10.00    Additional pack years: 0.00    Total pack years: 5.00    Types: E-cigarettes, Cigarettes    Quit date: 08/11/2021    Years since quitting: 0.6   Smokeless tobacco: Never  Vaping Use   Vaping Use: Every day  Substance and Sexual Activity   Alcohol use: Not Currently    Alcohol/week: 24.0 standard drinks of alcohol    Types: 24 Cans of beer per week    Comment: quit drinking approx 08/11/2021   Drug use: Never   Sexual activity: Not on file  Other Topics Concern   Not on file  Social History Narrative   Not on  file   Social Determinants of Health   Financial Resource Strain: Not on file  Food Insecurity: Not on file  Transportation Needs: Not on file  Physical Activity: Not on file  Stress: Not on file  Social Connections: Not on file  Intimate Partner Violence: Not on file    FAMILY HISTORY: Family History  Problem Relation Age of Onset   Ovarian cancer Mother    Asthma Mother    Alcoholism Father    Throat cancer Maternal Uncle    Heart disease Maternal Uncle    Lung cancer Maternal Uncle    Lymphoma Maternal Uncle    Lung cancer Paternal Grandmother     ALLERGIES:  has No Known Allergies.  MEDICATIONS:  Current Outpatient Medications  Medication Sig Dispense Refill   acyclovir (ZOVIRAX) 400 MG tablet Take 1 tablet (400 mg total) by mouth 2 (two) times daily. 60 tablet 5   allopurinol (ZYLOPRIM) 100 MG tablet Take 0.5 tablets (50 mg total) by mouth daily. 30 tablet 2   aspirin EC 81 MG tablet Take 81 mg by mouth daily. Swallow whole.     KLOR-CON M20 20 MEQ tablet TAKE 1 TABLET BY MOUTH EVERY DAY 30 tablet 0   lenalidomide (REVLIMID) 25 MG capsule Take 1  capsule (25 mg total) by mouth daily. Take for 14 days, then hold for 7 days. Repeat every 21 days. 14 capsule 0   OLANZapine (ZYPREXA) 5 MG tablet Take 1 tablet (5 mg total) by mouth at bedtime. 30 tablet 1   ondansetron (ZOFRAN) 8 MG tablet Take 1 tablet (8 mg total) by mouth 2 (two) times daily as needed for nausea or vomiting. 20 tablet 2   senna-docusate (SENOKOT-S) 8.6-50 MG tablet Take 1 tablet by mouth daily. 60 tablet 2   traMADol (ULTRAM) 50 MG tablet Take 1 tablet (50 mg total) by mouth every 6 (six) hours as needed. 30 tablet 0   dexamethasone (DECADRON) 4 MG tablet Take 5 tablets (20 mg total) by mouth See admin instructions. take dexamethasone 20 mg, day 2, 9,16. (Patient not taking: Reported on 02/05/2022) 60 tablet 0   No current facility-administered medications for this visit.   Facility-Administered  Medications Ordered in Other Visits  Medication Dose Route Frequency Provider Last Rate Last Admin   0.9 %  sodium chloride infusion   Intravenous Once [name], MD        Review of Systems  Constitutional:  Positive for fatigue. Negative for appetite change, chills, fever and unexpected weight change.  HENT:   Negative for hearing loss and voice change.   Eyes:  Negative for eye problems and icterus.  Respiratory:  Negative for chest tightness, cough and shortness of breath.   Cardiovascular:  Negative for chest pain and leg swelling.  Gastrointestinal:  Negative for abdominal distention and abdominal pain.  Endocrine: Negative for hot flashes.  Genitourinary:  Negative for difficulty urinating, dysuria and frequency.   Musculoskeletal:  Positive for back pain. Negative for arthralgias.  Skin:  Negative for itching and rash.  Neurological:  Negative for light-headedness and numbness.  Hematological:  Negative for adenopathy. Does not bruise/bleed easily.  Psychiatric/Behavioral:  Negative for confusion.      PHYSICAL EXAMINATION: ECOG PERFORMANCE STATUS: 1 - Symptomatic but completely ambulatory  Vitals:   03/23/22 0914  BP: 100/76  Pulse: (!) 107  Resp: 18  Temp: (!) 97.5 F (36.4 C)  SpO2: 100%   Filed Weights   03/23/22 0914  Weight: 205 lb 14.4 oz (93.4 kg)    Physical Exam Constitutional:      General: He is not in acute distress.    Appearance: He is obese. He is not diaphoretic.  HENT:     Head: Normocephalic and atraumatic.     Mouth/Throat:     Pharynx: No oropharyngeal exudate.  Eyes:     General: No scleral icterus. Cardiovascular:     Rate and Rhythm: Normal rate.     Heart sounds: No murmur heard. Pulmonary:     Effort: Pulmonary effort is normal. No respiratory distress.  Abdominal:     General: There is no distension.     Palpations: Abdomen is soft.  Musculoskeletal:        General: Normal range of motion.     Cervical back: Normal range of  motion.  Skin:    General: Skin is warm and dry.     Findings: No erythema.  Neurological:     Mental Status: He is alert and oriented to person, place, and time. Mental status is at baseline.     Cranial Nerves: No cranial nerve deficit.     Motor: No abnormal muscle tone.  Psychiatric:        Mood and Affect: Mood and affect normal.  LABORATORY DATA:  I have reviewed the data as listed    Latest Ref Rng & Units 03/23/2022    8:52 AM 02/05/2022    8:24 AM 01/08/2022    9:25 AM  CBC  WBC 4.0 - 10.5 K/uL 3.0  5.2  3.8   Hemoglobin 13.0 - 17.0 g/dL 10.3  13.5  12.9   Hematocrit 39.0 - 52.0 % 30.8  40.3  38.0   Platelets 150 - 400 K/uL 313  396  362       Latest Ref Rng & Units 03/23/2022    8:52 AM 02/05/2022    8:24 AM 01/08/2022    9:25 AM  CMP  Glucose 70 - 99 mg/dL 99  107  97   BUN 6 - 20 mg/dL 8  15  10    Creatinine 0.61 - 1.24 mg/dL 0.91  0.92  0.89   Sodium 135 - 145 mmol/L 137  135  136   Potassium 3.5 - 5.1 mmol/L 3.8  4.2  3.7   Chloride 98 - 111 mmol/L 103  96  100   CO2 22 - 32 mmol/L 24  27  27    Calcium 8.9 - 10.3 mg/dL 9.1  9.7  9.3   Total Protein 6.5 - 8.1 g/dL 6.9  7.7  7.3   Total Bilirubin 0.3 - 1.2 mg/dL 0.9  0.6  0.6   Alkaline Phos 38 - 126 U/L 82  101  88   AST 15 - 41 U/L 16  23  17    ALT 0 - 44 U/L 27  40  13        RADIOGRAPHIC STUDIES: I have personally reviewed the radiological images as listed and agreed with the findings in the report. NM PET Image Restage (PS) Whole Body (F-18 FDG)  Result Date: 02/25/2022 CLINICAL DATA:  Subsequent treatment strategy for multiple myeloma. EXAM: NUCLEAR MEDICINE PET WHOLE BODY TECHNIQUE: 11.3 mCi F-18 FDG was injected intravenously. Full-ring PET imaging was performed from the head to foot after the radiotracer. CT data was obtained and used for attenuation correction and anatomic localization. Fasting blood glucose: 107 mg/dl COMPARISON:  Multiple priors including CT chest abdomen pelvis December 24, 2021 and PET-CT September 10, 2021. FINDINGS: Mediastinal blood pool activity: SUV max 2.1 HEAD/NECK: Decreased FDG avidity in the clivus, occipital condyles and maxilla. For reference: -hypermetabolic lucency in the clivus now has a max SUV of 4.3 previously 13.1. Prominent bilateral cervical lymph nodes are similar in size to prior but now demonstrate mildly metabolic activity. For reference: Left level 2b lymph node measures 5 mm in short axis on image 67/2 with a max SUV of 2.2 previously measuring 4 mm without abnormal FDG avidity. Hypermetabolic hyperplasia of the tonsils with a max SUV of 15.5. Incidental CT findings: none CHEST: No hypermetabolic thoracic adenopathy. No hypermetabolic pulmonary nodules or masses. Hypermetabolic metabolic brown fat activity in the bilateral shoulder girdles Incidental CT findings: none ABDOMEN/PELVIS: No abnormal hypermetabolic activity within the liver, pancreas, adrenal glands, or spleen. No hypermetabolic lymph nodes in the abdomen or pelvis. Incidental CT findings: none SKELETON: Decreased FDG avidity associated with the multifocal hypermetabolic lucent osseous lesions in the visualized axial and appendicular skeleton. For reference: -max SUV in the T12 vertebral body is now 2.6 previously 10.7. -max SUV in the right hemi sacrum now 2.6 previously [id number] -metabolic activity within the left iliac bone lucent lesion on image 239/2 no has a max SUV of 1.7 previously 11.2 Incidental CT findings:  Unchanged pathologic fracture of the T8 superior endplate. EXTREMITIES: Resolved hypermetabolic activity in the bilateral femurs, max SUV of the left femur is now 1.2 previously 12.9. Incidental CT findings: none IMPRESSION: 1. Decreased FDG avidity associated with the multifocal hypermetabolic osseous lesions, consistent with treatment response. 2. Unchanged pathologic fracture of the T8 superior endplate. 3. Prominent bilateral cervical lymph nodes are similar in size to prior but now  demonstrate mildly metabolic activity, nonspecific but favored to be reactive. Attention on follow-up imaging suggested 4. Hypermetabolic hyperplasia of the tonsils, also favored reactive. Electronically Signed   By: Dahlia Bailiff M.D.   On: 02/25/2022 09:24

## 2022-03-23 NOTE — Assessment & Plan Note (Addendum)
Iron panel showed ferritin of 567.  Iron saturation normal.  Not typical iron deficiency. Likely anemia secondary to chronic disease.  Check reticulocyte panel.

## 2022-03-23 NOTE — Assessment & Plan Note (Signed)
PET scan showed multiple osseous lesions.  Previously discussed with neurosurgeon and radiation oncology.  No intervention for now. Awaiting dental clearance for bone strengthening agents-status post wisdom teeth extraction.

## 2022-03-23 NOTE — Assessment & Plan Note (Signed)
Chemotherapy plan as listed above 

## 2022-03-24 ENCOUNTER — Ambulatory Visit: Payer: Commercial Managed Care - PPO

## 2022-03-24 ENCOUNTER — Inpatient Hospital Stay: Payer: Commercial Managed Care - PPO

## 2022-03-24 ENCOUNTER — Other Ambulatory Visit: Payer: Commercial Managed Care - PPO

## 2022-03-24 LAB — KAPPA/LAMBDA LIGHT CHAINS
Kappa free light chain: 9.5 mg/L (ref 3.3–19.4)
Kappa, lambda light chain ratio: 1.06 (ref 0.26–1.65)
Lambda free light chains: 9 mg/L (ref 5.7–26.3)

## 2022-03-24 NOTE — Progress Notes (Signed)
Nutrition Follow-up:  Patient with multiple myeloma.  Received chemotherapy on 3/18 due to delay in getting stem cell transplant done, now planned for mid April.    Spoke with patient via phone for nutrition follow-up.  Reports that his appetite has improved following chemotherapy treatment yesterday (?steroids).  Prior to that appetite has been decreased.  Has some nausea and takes anti-nausea medication.  Has been drinking 1-2 ensure per day. Eating small meals frequently during the day of mostly fruits and vegetables.  Had wisdom teeth removed and has been staying away from hard to chew foods.     Medications: reviewed  Labs: reviewed  Anthropometrics:   Weight 205 lb 14.4 oz on 3/18 220 lb on 2/1 217 lb on 2/7 237 lb on 1/4 235 lb on 10/24  NUTRITION DIAGNOSIS: Inadequate oral intake improved    INTERVENTION:  Encouraged high calorie, high protein foods in preparation for transplant Continue ensure shakes for added nutrition Continue antinausea medication as needed    MONITORING, EVALUATION, GOAL: weight trends, intake   NEXT VISIT: as needed  Tanner [name], [place], [place] Registered Dietitian [phone]

## 2022-03-26 ENCOUNTER — Inpatient Hospital Stay: Payer: Commercial Managed Care - PPO

## 2022-03-26 ENCOUNTER — Other Ambulatory Visit: Payer: Self-pay | Admitting: Oncology

## 2022-03-26 VITALS — BP 107/78 | HR 114 | Temp 98.1°F | Resp 16

## 2022-03-26 DIAGNOSIS — Z5112 Encounter for antineoplastic immunotherapy: Secondary | ICD-10-CM | POA: Diagnosis not present

## 2022-03-26 DIAGNOSIS — C9000 Multiple myeloma not having achieved remission: Secondary | ICD-10-CM

## 2022-03-26 MED ORDER — PROCHLORPERAZINE MALEATE 10 MG PO TABS
10.0000 mg | ORAL_TABLET | Freq: Once | ORAL | Status: AC
  Administered 2022-03-26: 10 mg via ORAL
  Filled 2022-03-26: qty 1

## 2022-03-26 MED ORDER — BORTEZOMIB CHEMO SQ INJECTION 3.5 MG (2.5MG/ML)
1.3000 mg/m2 | Freq: Once | INTRAMUSCULAR | Status: AC
  Administered 2022-03-26: 3 mg via SUBCUTANEOUS
  Filled 2022-03-26: qty 1.2

## 2022-03-26 NOTE — Telephone Encounter (Signed)
Component Ref Range & Units 3 d ago (03/23/22) 1 mo ago (02/05/22) 2 mo ago (01/08/22) 3 mo ago (12/25/21) 3 mo ago (12/23/21) 3 mo ago (12/16/21) 3 mo ago (12/09/21)  Potassium 3.5 - 5.1 mmol/L 3.8 4.2 3.7 3.9 3.7 3.7 3.5

## 2022-03-26 NOTE — Progress Notes (Signed)
HR 114 ok to proceed per MD

## 2022-03-26 NOTE — Patient Instructions (Signed)
[facility] [place] [facility]  Discharge Instructions: Thank you for choosing [facility] to provide your oncology and hematology care.  If you have a lab appointment with the Cancer Center, please go directly to the Cancer Center and check in at the registration area.  Wear comfortable clothing and clothing appropriate for easy access to any Portacath or PICC line.   We strive to give you quality time with your provider. You may need to reschedule your appointment if you arrive late (15 or more minutes).  Arriving late affects you and other patients whose appointments are after yours.  Also, if you miss three or more appointments without notifying the office, you may be dismissed from the clinic at the provider's discretion.      For prescription refill requests, have your pharmacy contact our office and allow 72 hours for refills to be completed.    Today you received the following chemotherapy and/or immunotherapy agents velcade      To help prevent nausea and vomiting after your treatment, we encourage you to take your nausea medication as directed.  BELOW ARE SYMPTOMS THAT SHOULD BE REPORTED IMMEDIATELY: *FEVER GREATER THAN 100.4 F (38 C) OR HIGHER *CHILLS OR SWEATING *NAUSEA AND VOMITING THAT IS NOT CONTROLLED WITH YOUR NAUSEA MEDICATION *UNUSUAL SHORTNESS OF BREATH *UNUSUAL BRUISING OR BLEEDING *URINARY PROBLEMS (pain or burning when urinating, or frequent urination) *BOWEL PROBLEMS (unusual diarrhea, constipation, pain near the anus) TENDERNESS IN MOUTH AND THROAT WITH OR WITHOUT PRESENCE OF ULCERS (sore throat, sores in mouth, or a toothache) UNUSUAL RASH, SWELLING OR PAIN  UNUSUAL VAGINAL DISCHARGE OR ITCHING   Items with * indicate a potential emergency and should be followed up as soon as possible or go to the Emergency Department if any problems should occur.  Please show the CHEMOTHERAPY ALERT CARD or IMMUNOTHERAPY ALERT CARD at check-in to  the Emergency Department and triage nurse.  Should you have questions after your visit or need to cancel or reschedule your appointment, please contact [facility] [place] [facility]  [phone] and follow the prompts.  Office hours are 8:00 a.m. to 4:30 p.m. Monday - Friday. Please note that voicemails left after 4:00 p.m. may not be returned until the following business day.  We are closed weekends and major holidays. You have access to a nurse at all times for urgent questions. Please call the main number to the clinic [phone] and follow the prompts.  For any non-urgent questions, you may also contact your provider using MyChart. We now offer e-Visits for anyone 18 and older to request care online for non-urgent symptoms. For details visit [email or web address].   Also download the MyChart app! Go to the app store, search "MyChart", open the app, select , and log in with your MyChart username and password.    

## 2022-03-27 LAB — MULTIPLE MYELOMA PANEL, SERUM
Albumin SerPl Elph-Mcnc: 3.8 g/dL (ref 2.9–4.4)
Albumin/Glob SerPl: 1.6 (ref 0.7–1.7)
Alpha 1: 0.2 g/dL (ref 0.0–0.4)
Alpha2 Glob SerPl Elph-Mcnc: 0.7 g/dL (ref 0.4–1.0)
B-Globulin SerPl Elph-Mcnc: 0.9 g/dL (ref 0.7–1.3)
Gamma Glob SerPl Elph-Mcnc: 0.6 g/dL (ref 0.4–1.8)
Globulin, Total: 2.4 g/dL (ref 2.2–3.9)
IgA: 64 mg/dL — ABNORMAL LOW (ref 90–386)
IgG (Immunoglobin G), Serum: 678 mg/dL (ref 603–1613)
IgM (Immunoglobulin M), Srm: 42 mg/dL (ref 20–172)
M Protein SerPl Elph-Mcnc: 0.2 g/dL — ABNORMAL HIGH
Total Protein ELP: 6.2 g/dL (ref 6.0–8.5)

## 2022-03-30 ENCOUNTER — Inpatient Hospital Stay: Payer: Commercial Managed Care - PPO

## 2022-03-30 VITALS — BP 109/84 | HR 107 | Temp 97.8°F | Wt 206.5 lb

## 2022-03-30 DIAGNOSIS — Z5112 Encounter for antineoplastic immunotherapy: Secondary | ICD-10-CM | POA: Diagnosis not present

## 2022-03-30 DIAGNOSIS — C9000 Multiple myeloma not having achieved remission: Secondary | ICD-10-CM

## 2022-03-30 LAB — COMPREHENSIVE METABOLIC PANEL
ALT: 19 U/L (ref 0–44)
AST: 16 U/L (ref 15–41)
Albumin: 4.3 g/dL (ref 3.5–5.0)
Alkaline Phosphatase: 78 U/L (ref 38–126)
Anion gap: 7 (ref 5–15)
BUN: 12 mg/dL (ref 6–20)
CO2: 25 mmol/L (ref 22–32)
Calcium: 9.4 mg/dL (ref 8.9–10.3)
Chloride: 103 mmol/L (ref 98–111)
Creatinine, Ser: 0.97 mg/dL (ref 0.61–1.24)
GFR, Estimated: >60 mL/min (ref >60)
Glucose, Bld: 103 mg/dL — ABNORMAL HIGH (ref 70–99)
Potassium: 3.7 mmol/L (ref 3.5–5.1)
Sodium: 135 mmol/L (ref 135–145)
Total Bilirubin: 1 mg/dL (ref 0.3–1.2)
Total Protein: 7 g/dL (ref 6.5–8.1)

## 2022-03-30 LAB — CBC WITH DIFFERENTIAL/PLATELET
Abs Immature Granulocytes: 0 10*3/uL (ref 0.00–0.07)
Basophils Absolute: 0 10*3/uL (ref 0.0–0.1)
Basophils Relative: 0 %
Eosinophils Absolute: 0.2 10*3/uL (ref 0.0–0.5)
Eosinophils Relative: 5 %
HCT: 34 % — ABNORMAL LOW (ref 39.0–52.0)
Hemoglobin: 11.3 g/dL — ABNORMAL LOW (ref 13.0–17.0)
Immature Granulocytes: 0 %
Lymphocytes Relative: 28 %
Lymphs Abs: 0.9 10*3/uL (ref 0.7–4.0)
MCH: 26.2 pg (ref 26.0–34.0)
MCHC: 33.2 g/dL (ref 30.0–36.0)
MCV: 78.9 fL — ABNORMAL LOW (ref 80.0–100.0)
Monocytes Absolute: 0.5 10*3/uL (ref 0.1–1.0)
Monocytes Relative: 15 %
Neutro Abs: 1.6 10*3/uL — ABNORMAL LOW (ref 1.7–7.7)
Neutrophils Relative %: 52 %
Platelets: 343 10*3/uL (ref 150–400)
RBC: 4.31 MIL/uL (ref 4.22–5.81)
RDW: 17 % — ABNORMAL HIGH (ref 11.5–15.5)
WBC: 3.1 10*3/uL — ABNORMAL LOW (ref 4.0–10.5)
nRBC: 0 % (ref 0.0–0.2)

## 2022-03-30 MED ORDER — PROCHLORPERAZINE MALEATE 10 MG PO TABS
10.0000 mg | ORAL_TABLET | Freq: Four times a day (QID) | ORAL | Status: DC | PRN
  Administered 2022-03-30: 10 mg via ORAL
  Filled 2022-03-30: qty 1

## 2022-03-30 MED ORDER — BORTEZOMIB CHEMO SQ INJECTION 3.5 MG (2.5MG/ML)
1.3000 mg/m2 | Freq: Once | INTRAMUSCULAR | Status: AC
  Administered 2022-03-30: 3 mg via SUBCUTANEOUS
  Filled 2022-03-30: qty 1.2

## 2022-03-30 NOTE — Progress Notes (Signed)
Per Dr. Tasia Catchings and team, patient should only receive Velcade today.

## 2022-04-01 ENCOUNTER — Telehealth: Payer: Self-pay | Admitting: *Deleted

## 2022-04-01 NOTE — Telephone Encounter (Signed)
Patient called reporting that he is having an allergic reaction to his Velcade injections. He states that his recent injection site is red and that the old site is darker and is itching. He is asking if he needs to be seen for this. Please advise

## 2022-04-02 ENCOUNTER — Ambulatory Visit: Payer: Commercial Managed Care - PPO

## 2022-04-02 ENCOUNTER — Other Ambulatory Visit: Payer: Commercial Managed Care - PPO

## 2022-04-06 ENCOUNTER — Telehealth: Payer: Self-pay | Admitting: *Deleted

## 2022-04-06 NOTE — Telephone Encounter (Signed)
Call from [facility] asking about proceeding with BMT and patient needing 2 more cycles of treatment. Tanner [name] return her call to discuss further [phone]

## 2022-04-07 ENCOUNTER — Other Ambulatory Visit: Payer: Self-pay

## 2022-04-07 NOTE — Telephone Encounter (Signed)
Called and spoke to [place], informed her that pt is scheduled for Velcade tomorrow (4/3).  She is wanting to start setting up BM work up and procedures, but she wants to know if he is going to get additional cycles or just tomorrow?

## 2022-04-08 ENCOUNTER — Inpatient Hospital Stay: Payer: Commercial Managed Care - PPO

## 2022-04-08 ENCOUNTER — Other Ambulatory Visit: Payer: Self-pay

## 2022-04-08 ENCOUNTER — Other Ambulatory Visit: Payer: Self-pay | Admitting: Oncology

## 2022-04-08 ENCOUNTER — Inpatient Hospital Stay: Payer: Commercial Managed Care - PPO | Attending: Oncology

## 2022-04-08 VITALS — BP 99/75 | HR 100 | Temp 97.8°F | Resp 17 | Wt 207.0 lb

## 2022-04-08 DIAGNOSIS — Z5112 Encounter for antineoplastic immunotherapy: Secondary | ICD-10-CM | POA: Diagnosis present

## 2022-04-08 DIAGNOSIS — E876 Hypokalemia: Secondary | ICD-10-CM | POA: Insufficient documentation

## 2022-04-08 DIAGNOSIS — L089 Local infection of the skin and subcutaneous tissue, unspecified: Secondary | ICD-10-CM | POA: Insufficient documentation

## 2022-04-08 DIAGNOSIS — L03012 Cellulitis of left finger: Secondary | ICD-10-CM | POA: Diagnosis not present

## 2022-04-08 DIAGNOSIS — F17290 Nicotine dependence, other tobacco product, uncomplicated: Secondary | ICD-10-CM | POA: Insufficient documentation

## 2022-04-08 DIAGNOSIS — C9000 Multiple myeloma not having achieved remission: Secondary | ICD-10-CM

## 2022-04-08 LAB — CBC WITH DIFFERENTIAL/PLATELET
Abs Immature Granulocytes: 0.02 10*3/uL (ref 0.00–0.07)
Basophils Absolute: 0 10*3/uL (ref 0.0–0.1)
Basophils Relative: 0 %
Eosinophils Absolute: 0.3 10*3/uL (ref 0.0–0.5)
Eosinophils Relative: 5 %
HCT: 33.3 % — ABNORMAL LOW (ref 39.0–52.0)
Hemoglobin: 11.1 g/dL — ABNORMAL LOW (ref 13.0–17.0)
Immature Granulocytes: 0 %
Lymphocytes Relative: 22 %
Lymphs Abs: 1.2 10*3/uL (ref 0.7–4.0)
MCH: 26.7 pg (ref 26.0–34.0)
MCHC: 33.3 g/dL (ref 30.0–36.0)
MCV: 80 fL (ref 80.0–100.0)
Monocytes Absolute: 0.6 10*3/uL (ref 0.1–1.0)
Monocytes Relative: 12 %
Neutro Abs: 3.3 10*3/uL (ref 1.7–7.7)
Neutrophils Relative %: 61 %
Platelets: 268 10*3/uL (ref 150–400)
RBC: 4.16 MIL/uL — ABNORMAL LOW (ref 4.22–5.81)
RDW: 16.6 % — ABNORMAL HIGH (ref 11.5–15.5)
WBC: 5.5 10*3/uL (ref 4.0–10.5)
nRBC: 0 % (ref 0.0–0.2)

## 2022-04-08 LAB — COMPREHENSIVE METABOLIC PANEL
ALT: 14 U/L (ref 0–44)
AST: 16 U/L (ref 15–41)
Albumin: 4.1 g/dL (ref 3.5–5.0)
Alkaline Phosphatase: 77 U/L (ref 38–126)
Anion gap: 9 (ref 5–15)
BUN: 13 mg/dL (ref 6–20)
CO2: 24 mmol/L (ref 22–32)
Calcium: 9.3 mg/dL (ref 8.9–10.3)
Chloride: 104 mmol/L (ref 98–111)
Creatinine, Ser: 0.82 mg/dL (ref 0.61–1.24)
GFR, Estimated: >60 mL/min (ref >60)
Glucose, Bld: 95 mg/dL (ref 70–99)
Potassium: 3.9 mmol/L (ref 3.5–5.1)
Sodium: 137 mmol/L (ref 135–145)
Total Bilirubin: 0.6 mg/dL (ref 0.3–1.2)
Total Protein: 6.9 g/dL (ref 6.5–8.1)

## 2022-04-08 MED ORDER — PROCHLORPERAZINE MALEATE 10 MG PO TABS
10.0000 mg | ORAL_TABLET | Freq: Once | ORAL | Status: AC
  Administered 2022-04-08: 10 mg via ORAL
  Filled 2022-04-08: qty 1

## 2022-04-08 MED ORDER — LENALIDOMIDE 25 MG PO CAPS
25.0000 mg | ORAL_CAPSULE | Freq: Every day | ORAL | 0 refills | Status: DC
Start: 2022-04-08 — End: 2022-08-21

## 2022-04-08 MED ORDER — DEXAMETHASONE 4 MG PO TABS
8.0000 mg | ORAL_TABLET | Freq: Once | ORAL | Status: AC
  Administered 2022-04-08: 8 mg via ORAL
  Filled 2022-04-08: qty 2

## 2022-04-08 MED ORDER — BORTEZOMIB CHEMO SQ INJECTION 3.5 MG (2.5MG/ML)
1.3000 mg/m2 | Freq: Once | INTRAMUSCULAR | Status: AC
  Administered 2022-04-08: 3 mg via SUBCUTANEOUS
  Filled 2022-04-08: qty 1.2

## 2022-04-08 MED ORDER — DEXAMETHASONE 4 MG PO TABS: 10.0000 mg | ORAL_TABLET | Freq: Once | ORAL | Status: DC

## 2022-04-08 NOTE — Telephone Encounter (Signed)
Dr. Tasia Catchings spoke to BMT coordinator. plan is to do another cycle of chemo.  chemo per IS: 4/8- lab/MD/chemo.  Revlimid refill has been sent.   Please call pt with appts once scheduled.

## 2022-04-08 NOTE — Progress Notes (Signed)
Patient states he had reaction last time, redness, swelling and itching after the Velcade injection. He used the hydrocortisone cream prior to coming but is asking for a steroid pill. MD notified, ordered 8 mg Decadron PO. Given. Encouraged patient to wait after but patient refused to wait for monitoring post chemo injection. Discharged, stable

## 2022-04-09 ENCOUNTER — Other Ambulatory Visit: Payer: Self-pay

## 2022-04-09 ENCOUNTER — Other Ambulatory Visit: Payer: Self-pay | Admitting: Oncology

## 2022-04-09 ENCOUNTER — Encounter: Payer: Self-pay | Admitting: Oncology

## 2022-04-09 NOTE — Telephone Encounter (Signed)
Dr. Tasia Catchings has talked to [facility] and she will adjust his next tx to 4/15. Chemo IS updated and pt informed of appts. He  was also informed that Revlimid refill has been sent and not to start until he see's Dr. Tasia Catchings on 4/15. Pt verbalized understanding.

## 2022-04-10 ENCOUNTER — Other Ambulatory Visit: Payer: Self-pay

## 2022-04-13 ENCOUNTER — Inpatient Hospital Stay: Payer: Commercial Managed Care - PPO

## 2022-04-13 ENCOUNTER — Inpatient Hospital Stay: Payer: Commercial Managed Care - PPO | Admitting: Oncology

## 2022-04-16 ENCOUNTER — Ambulatory Visit: Payer: Commercial Managed Care - PPO

## 2022-04-20 ENCOUNTER — Inpatient Hospital Stay (HOSPITAL_BASED_OUTPATIENT_CLINIC_OR_DEPARTMENT_OTHER): Payer: Commercial Managed Care - PPO | Admitting: Oncology

## 2022-04-20 ENCOUNTER — Ambulatory Visit: Payer: Commercial Managed Care - PPO

## 2022-04-20 ENCOUNTER — Inpatient Hospital Stay: Payer: Commercial Managed Care - PPO

## 2022-04-20 ENCOUNTER — Encounter: Payer: Self-pay | Admitting: Oncology

## 2022-04-20 ENCOUNTER — Other Ambulatory Visit: Payer: Commercial Managed Care - PPO

## 2022-04-20 ENCOUNTER — Other Ambulatory Visit: Payer: Self-pay | Admitting: Oncology

## 2022-04-20 VITALS — BP 118/91 | HR 104 | Temp 97.9°F | Resp 18 | Wt 214.5 lb

## 2022-04-20 DIAGNOSIS — E876 Hypokalemia: Secondary | ICD-10-CM

## 2022-04-20 DIAGNOSIS — C9000 Multiple myeloma not having achieved remission: Secondary | ICD-10-CM

## 2022-04-20 DIAGNOSIS — Z5111 Encounter for antineoplastic chemotherapy: Secondary | ICD-10-CM

## 2022-04-20 DIAGNOSIS — C7951 Secondary malignant neoplasm of bone: Secondary | ICD-10-CM | POA: Diagnosis not present

## 2022-04-20 DIAGNOSIS — Z5112 Encounter for antineoplastic immunotherapy: Secondary | ICD-10-CM | POA: Diagnosis not present

## 2022-04-20 LAB — CBC WITH DIFFERENTIAL/PLATELET
Abs Immature Granulocytes: 0.01 10*3/uL (ref 0.00–0.07)
Basophils Absolute: 0 10*3/uL (ref 0.0–0.1)
Basophils Relative: 1 %
Eosinophils Absolute: 0.2 10*3/uL (ref 0.0–0.5)
Eosinophils Relative: 4 %
HCT: 35.3 % — ABNORMAL LOW (ref 39.0–52.0)
Hemoglobin: 11.7 g/dL — ABNORMAL LOW (ref 13.0–17.0)
Immature Granulocytes: 0 %
Lymphocytes Relative: 32 %
Lymphs Abs: 1.3 10*3/uL (ref 0.7–4.0)
MCH: 26.6 pg (ref 26.0–34.0)
MCHC: 33.1 g/dL (ref 30.0–36.0)
MCV: 80.2 fL (ref 80.0–100.0)
Monocytes Absolute: 0.5 10*3/uL (ref 0.1–1.0)
Monocytes Relative: 13 %
Neutro Abs: 2 10*3/uL (ref 1.7–7.7)
Neutrophils Relative %: 50 %
Platelets: 315 10*3/uL (ref 150–400)
RBC: 4.4 MIL/uL (ref 4.22–5.81)
RDW: 16.9 % — ABNORMAL HIGH (ref 11.5–15.5)
WBC: 4 10*3/uL (ref 4.0–10.5)
nRBC: 0 % (ref 0.0–0.2)

## 2022-04-20 LAB — COMPREHENSIVE METABOLIC PANEL
ALT: 10 U/L (ref 0–44)
AST: 16 U/L (ref 15–41)
Albumin: 4 g/dL (ref 3.5–5.0)
Alkaline Phosphatase: 66 U/L (ref 38–126)
Anion gap: 8 (ref 5–15)
BUN: 9 mg/dL (ref 6–20)
CO2: 24 mmol/L (ref 22–32)
Calcium: 9 mg/dL (ref 8.9–10.3)
Chloride: 105 mmol/L (ref 98–111)
Creatinine, Ser: 0.91 mg/dL (ref 0.61–1.24)
GFR, Estimated: >60 mL/min (ref >60)
Glucose, Bld: 98 mg/dL (ref 70–99)
Potassium: 3.7 mmol/L (ref 3.5–5.1)
Sodium: 137 mmol/L (ref 135–145)
Total Bilirubin: 0.8 mg/dL (ref 0.3–1.2)
Total Protein: 6.6 g/dL (ref 6.5–8.1)

## 2022-04-20 MED ORDER — MONTELUKAST SODIUM 10 MG PO TABS
10.0000 mg | ORAL_TABLET | Freq: Once | ORAL | Status: AC
  Administered 2022-04-20: 10 mg via ORAL
  Filled 2022-04-20: qty 1

## 2022-04-20 MED ORDER — DEXAMETHASONE 4 MG PO TABS: 20.0000 mg | ORAL_TABLET | ORAL | 0 refills | Status: DC

## 2022-04-20 MED ORDER — BORTEZOMIB CHEMO SQ INJECTION 3.5 MG (2.5MG/ML)
1.3000 mg/m2 | Freq: Once | INTRAMUSCULAR | Status: AC
  Administered 2022-04-20: 3 mg via SUBCUTANEOUS
  Filled 2022-04-20: qty 1.2

## 2022-04-20 MED ORDER — DEXAMETHASONE 4 MG PO TABS
20.0000 mg | ORAL_TABLET | Freq: Once | ORAL | Status: AC
  Administered 2022-04-20: 20 mg via ORAL
  Filled 2022-04-20: qty 5

## 2022-04-20 MED ORDER — DARATUMUMAB-HYALURONIDASE-FIHJ 1800-30000 MG-UT/15ML ~~LOC~~ SOLN
1800.0000 mg | Freq: Once | SUBCUTANEOUS | Status: AC
  Administered 2022-04-20: 1800 mg via SUBCUTANEOUS
  Filled 2022-04-20: qty 15

## 2022-04-20 MED ORDER — DIPHENHYDRAMINE HCL 25 MG PO CAPS
50.0000 mg | ORAL_CAPSULE | Freq: Once | ORAL | Status: AC
  Administered 2022-04-20: 50 mg via ORAL
  Filled 2022-04-20: qty 2

## 2022-04-20 MED ORDER — ACETAMINOPHEN 325 MG PO TABS
650.0000 mg | ORAL_TABLET | Freq: Once | ORAL | Status: AC
  Administered 2022-04-20: 650 mg via ORAL
  Filled 2022-04-20: qty 2

## 2022-04-20 NOTE — Assessment & Plan Note (Signed)
PET scan showed multiple osseous lesions.  Previously discussed with neurosurgeon and radiation oncology.  No intervention for now. Awaiting dental clearance for bone strengthening agents-status post wisdom teeth extraction.   

## 2022-04-20 NOTE — Assessment & Plan Note (Signed)
Continue potassium supplementation 

## 2022-04-20 NOTE — Assessment & Plan Note (Addendum)
#   IgG lamda multiple myeloma On chemotherapy plan with lenalidomide (25 mg daily on days 1-14), subcutaneous bortezomib (1.3 mg/m2 on days 1, 4, 8, and 11), and oral dexamethasone (20 mg on days 1, 2, 8, 9, 15, and 16). SubQ daratumumab on days 1, 8, and 15 of cycles 1 through 4 and day 1 of  consolidation cycles (cycles 5, 6, 7, 8, 9).   Labs are reviewed and discussed with patient. 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 Pandora Leiter at [facility] for decision of transplant.  Continue  Aspirin 81mg  daily  #Hyperuricemia, continue allopurinol

## 2022-04-20 NOTE — Progress Notes (Signed)
Pt here for follow up. No new concerns voiced.   

## 2022-04-20 NOTE — Progress Notes (Signed)
Hematology/Oncology Progress note [phone] Fax:([phone]     CHIEF COMPLAINTS/PURPOSE OF CONSULTATION:  Multiple myeloma  ASSESSMENT & PLAN:   Cancer Staging  Multiple myeloma not having achieved remission Staging form: Plasma Cell Myeloma and Plasma Cell Disorders, AJCC 8th Edition - Clinical stage from 08/20/2021: RISS Stage II (Beta-2-microglobulin (mg/L): 3.6, Albumin (g/dL): 2.2, ISS: Stage II, High-risk cytogenetics: Absent, LDH: Normal) - Signed by Rickard Patience, MD on 10/14/2021   Multiple myeloma not having achieved remission (HCC) # IgG lamda multiple myeloma On chemotherapy plan with lenalidomide (25 mg daily on days 1-14), subcutaneous bortezomib (1.3 mg/m2 on days 1, 4, 8, and 11), and oral dexamethasone (20 mg on days 1, 2, 8, 9, 15, and 16). SubQ daratumumab on days 1, 8, and 15 of cycles 1 through 4 and day 1 of  consolidation cycles (cycles 5, 6, 7, 8, 9).   Labs are reviewed and discussed with patient. 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 Pandora Leiter at [facility] for decision of transplant.  Continue  Aspirin  daily  #Hyperuricemia, continue allopurinol  Hypokalemia Continue potassium supplementation.   Encounter for antineoplastic chemotherapy Chemotherapy plan as listed above.   Metastatic cancer to bone [facility]) PET scan showed multiple osseous lesions.  Previously discussed with neurosurgeon and radiation oncology.  No intervention for now. Awaiting dental clearance for bone strengthening agents-status post wisdom teeth extraction.      Recommend patient to obtain dental clearance  Follow up  Per LOS.  All questions were answered. The patient knows to call the clinic with any problems, questions or concerns. No barriers to learning was detected.  Rickard Patience, MD 04/20/2022     HISTORY OF PRESENTING ILLNESS:  Tanner [name] 37 y.o. male presents  for follow up of Multiple myeloma I have reviewed his chart and materials related to his cancer extensively and collaborated history with the patient. Summary of oncologic history is as follows: Oncology History  Multiple myeloma not having achieved remission  08/14/2021 Imaging   CT chest abdomen pelvis without contrast Showed numerous lucencies throughout the bone structures, most pronounced throughout the spine and left left iliac bone.  Otherwise no acute findings in the chest abdomen or pelvis.  Sigmoid diverticulosis.   08/19/2021 Initial Diagnosis   Multiple myeloma not having achieved remission, stage II  -08/15/2021, bone marrow biopsy showed plasma cell myeloma involving greater than 80% of the marrow. Cytogenetics Myeloma  FISH   08/20/2021 Cancer Staging   Staging form: Plasma Cell Myeloma and Plasma Cell Disorders, AJCC 8th Edition - Clinical stage from 08/20/2021: RISS Stage II (Beta-2-microglobulin (mg/L): 3.6, Albumin (g/dL): 2.2, ISS: Stage II, High-risk cytogenetics: Absent, LDH: Normal) - Signed by Rickard Patience, MD on 10/14/2021 Beta 2 microglobulin range (mg/L): 3.5 to 5.49 Albumin range (g/dL): Less than 3.5 Cytogenetics: No abnormalities   08/20/2021 -  Chemotherapy   MYELOMA NEWLY DIAGNOSED TRANSPLANT CANDIDATE DaraVRd (Daratumumab SUBQ) q21d x 6 Cycles (Induction/Consolidation)      09/11/2021 Imaging   PET scan showed  1. Extensive multifocal hypermetabolic disease throughout the axial and appendicular skeleton, with numerous lucent osseous lesions,consistent with patient's known diagnosis of multiple myeloma. 2. No tracer avid lymph node or mass identified to suggest soft tissue plasmacytoma   12/18/2021 Imaging   MRI lumbar 1. Multiple scattered enhancing marrow replacing bone lesions within  the imaged spine and pelvis consistent with known history of multiple myeloma. No pathologic  fracture or extraosseous soft tissue component. 2. Unchanged mild multilevel degenerative changes of the lumbar spine resulting in mild canal stenosis at L2-L3 and L3-L4.  3. Left greater than right subarticular recess stenosis and mild left foraminal stenosis at L5-S1.        INTERVAL HISTORY Tanner [name] is a 37 y.o. male who has above history reviewed by me today presents for follow up visit for management of multiple myeloma Denies any neuropathy symptoms.  Patient is accompanied by mother.  During interval, patient has been evaluated by pulmonary transplant team at [facility]  He has had wisdom teeth extraction last week.  He also had seen cardiologist for pulmonary transplant evaluation. Patient presents for another cycle of chemotherapy bridging him to bone marrow transplant.   MEDICAL HISTORY:  Past Medical History:  Diagnosis Date   Alcohol use    Sciatica    Smoking     SURGICAL HISTORY: Past Surgical History:  Procedure Laterality Date   INGUINAL HERNIA REPAIR      SOCIAL HISTORY: Social History   Socioeconomic History   Marital status: Single    Spouse name: Not on file   Number of children: Not on file   Years of education: Not on file   Highest education level: Not on file  Occupational History   Not on file  Tobacco Use   Smoking status: Former    Packs/day: 0.50    Years: 10.00    Additional pack years: 0.00    Total pack years: 5.00    Types: E-cigarettes, Cigarettes    Quit date: 08/11/2021    Years since quitting: 0.6   Smokeless tobacco: Never  Vaping Use   Vaping Use: Every day  Substance and Sexual Activity   Alcohol use: Not Currently    Alcohol/week: 24.0 standard drinks of alcohol    Types: 24 Cans of beer per week    Comment: quit drinking approx 08/11/2021   Drug use: Never   Sexual activity: Not on file  Other Topics Concern   Not on file  Social History Narrative   Not on file   Social Determinants of Health    Financial Resource Strain: Not on file  Food Insecurity: Not on file  Transportation Needs: Not on file  Physical Activity: Not on file  Stress: Not on file  Social Connections: Not on file  Intimate Partner Violence: Not on file    FAMILY HISTORY: Family History  Problem Relation Age of Onset   Ovarian cancer Mother    Asthma Mother    Alcoholism Father    Throat cancer Maternal Uncle    Heart disease Maternal Uncle    Lung cancer Maternal Uncle    Lymphoma Maternal Uncle    Lung cancer Paternal Grandmother     ALLERGIES:  has No Known Allergies.  MEDICATIONS:  Current Outpatient Medications  Medication Sig Dispense Refill   acyclovir (ZOVIRAX) 400 MG tablet Take 1 tablet (400 mg total) by mouth 2 (two) times daily. 60 tablet 5   allopurinol (ZYLOPRIM) 100 MG tablet Take 0.5 tablets (50 mg total) by mouth daily. 30 tablet 2   aspirin EC 81 MG tablet Take 81 mg by mouth daily. Swallow whole.     KLOR-CON M20 20 MEQ tablet TAKE 1 TABLET BY MOUTH EVERY DAY 30 tablet 0   lenalidomide (REVLIMID) 25 MG capsule Take 1 capsule (25 mg total)  by mouth daily. Take for 14 days, then hold for 7 days. Repeat every 21 days. 14 capsule 0   OLANZapine (ZYPREXA) 5 MG tablet Take 1 tablet (5 mg total) by mouth at bedtime. 30 tablet 1   ondansetron (ZOFRAN) 8 MG tablet Take 1 tablet (8 mg total) by mouth 2 (two) times daily as needed for nausea or vomiting. 20 tablet 2   senna-docusate (SENOKOT-S) 8.6-50 MG tablet Take 1 tablet by mouth daily. 60 tablet 2   traMADol (ULTRAM) 50 MG tablet Take 1 tablet (50 mg total) by mouth every 6 (six) hours as needed. 30 tablet 0   dexamethasone (DECADRON) 4 MG tablet Take 5 tablets (20 mg total) by mouth See admin instructions. take dexamethasone 20 mg, day 2, 9,16. 60 tablet 0   No current facility-administered medications for this visit.    Review of Systems  Constitutional:  Positive for fatigue. Negative for appetite change, chills, fever and  unexpected weight change.  HENT:   Negative for hearing loss and voice change.   Eyes:  Negative for eye problems and icterus.  Respiratory:  Negative for chest tightness, cough and shortness of breath.   Cardiovascular:  Negative for chest pain and leg swelling.  Gastrointestinal:  Negative for abdominal distention and abdominal pain.  Endocrine: Negative for hot flashes.  Genitourinary:  Negative for difficulty urinating, dysuria and frequency.   Musculoskeletal:  Positive for back pain. Negative for arthralgias.  Skin:  Negative for itching and rash.  Neurological:  Negative for light-headedness and numbness.  Hematological:  Negative for adenopathy. Does not bruise/bleed easily.  Psychiatric/Behavioral:  Negative for confusion.      PHYSICAL EXAMINATION: ECOG PERFORMANCE STATUS: 1 - Symptomatic but completely ambulatory  Vitals:   04/20/22 0910  BP: (!) 118/91  Pulse: (!) 104  Resp: 18  Temp: 97.9 F (36.6 C)   Filed Weights   04/20/22 0910  Weight: 214 lb 8 oz (97.3 kg)    Physical Exam Constitutional:      General: He is not in acute distress.    Appearance: He is obese. He is not diaphoretic.  HENT:     Head: Normocephalic and atraumatic.     Mouth/Throat:     Pharynx: No oropharyngeal exudate.  Eyes:     General: No scleral icterus. Cardiovascular:     Rate and Rhythm: Normal rate.     Heart sounds: No murmur heard. Pulmonary:     Effort: Pulmonary effort is normal. No respiratory distress.  Abdominal:     General: There is no distension.     Palpations: Abdomen is soft.  Musculoskeletal:        General: Normal range of motion.     Cervical back: Normal range of motion.  Skin:    General: Skin is warm and dry.     Findings: No erythema.  Neurological:     Mental Status: He is alert and oriented to person, place, and time. Mental status is at baseline.     Cranial Nerves: No cranial nerve deficit.     Motor: No abnormal muscle tone.  Psychiatric:         Mood and Affect: Mood and affect normal.      LABORATORY DATA:  I have reviewed the data as listed    Latest Ref Rng & Units 04/20/2022    8:52 AM 04/08/2022   12:41 PM 03/30/2022   10:25 AM  CBC  WBC 4.0 - 10.5 K/uL 4.0  5.5  3.1   Hemoglobin 13.0 - 17.0 g/dL 60.7  37.1  06.2   Hematocrit 39.0 - 52.0 % 35.3  33.3  34.0   Platelets 150 - 400 K/uL 315  268  343       Latest Ref Rng & Units 04/20/2022    8:52 AM 04/08/2022   12:41 PM 03/30/2022   10:25 AM  CMP  Glucose 70 - 99 mg/dL 98  95  694   BUN 6 - 20 mg/dL 9  13  12    Creatinine 0.61 - 1.24 mg/dL 8.54  6.27  0.35   Sodium 135 - 145 mmol/L 137  137  135   Potassium 3.5 - 5.1 mmol/L 3.7  3.9  3.7   Chloride 98 - 111 mmol/L 105  104  103   CO2 22 - 32 mmol/L 24  24  25    Calcium 8.9 - 10.3 mg/dL 9.0  9.3  9.4   Total Protein 6.5 - 8.1 g/dL 6.6  6.9  7.0   Total Bilirubin 0.3 - 1.2 mg/dL 0.8  0.6  1.0   Alkaline Phos 38 - 126 U/L 66  77  78   AST 15 - 41 U/L 16  16  16    ALT 0 - 44 U/L 10  14  19         RADIOGRAPHIC STUDIES: I have personally reviewed the radiological images as listed and agreed with the findings in the report. No results found.

## 2022-04-20 NOTE — Assessment & Plan Note (Signed)
Chemotherapy plan as listed above 

## 2022-04-21 ENCOUNTER — Other Ambulatory Visit: Payer: Self-pay | Admitting: Oncology

## 2022-04-21 ENCOUNTER — Other Ambulatory Visit: Payer: Self-pay

## 2022-04-21 ENCOUNTER — Encounter: Payer: Self-pay | Admitting: Oncology

## 2022-04-21 LAB — KAPPA/LAMBDA LIGHT CHAINS
Kappa free light chain: 5.2 mg/L (ref 3.3–19.4)
Kappa, lambda light chain ratio: 1.27 (ref 0.26–1.65)
Lambda free light chains: 4.1 mg/L — ABNORMAL LOW (ref 5.7–26.3)

## 2022-04-21 MED ORDER — DEXAMETHASONE 4 MG PO TABS: 20.0000 mg | ORAL_TABLET | ORAL | 0 refills | Status: DC

## 2022-04-22 ENCOUNTER — Other Ambulatory Visit: Payer: Self-pay

## 2022-04-22 MED ORDER — DEXAMETHASONE 4 MG PO TABS: 20.0000 mg | ORAL_TABLET | ORAL | 0 refills | Status: DC

## 2022-04-23 ENCOUNTER — Other Ambulatory Visit: Payer: Self-pay

## 2022-04-23 ENCOUNTER — Inpatient Hospital Stay: Payer: Commercial Managed Care - PPO

## 2022-04-23 VITALS — BP 121/76 | HR 94 | Temp 97.4°F | Resp 18 | Wt 216.3 lb

## 2022-04-23 DIAGNOSIS — C9000 Multiple myeloma not having achieved remission: Secondary | ICD-10-CM

## 2022-04-23 DIAGNOSIS — Z5112 Encounter for antineoplastic immunotherapy: Secondary | ICD-10-CM | POA: Diagnosis not present

## 2022-04-23 LAB — MULTIPLE MYELOMA PANEL, SERUM
Albumin SerPl Elph-Mcnc: 3.6 g/dL (ref 2.9–4.4)
Albumin/Glob SerPl: 1.5 (ref 0.7–1.7)
Alpha 1: 0.2 g/dL (ref 0.0–0.4)
Alpha2 Glob SerPl Elph-Mcnc: 0.7 g/dL (ref 0.4–1.0)
B-Globulin SerPl Elph-Mcnc: 1 g/dL (ref 0.7–1.3)
Gamma Glob SerPl Elph-Mcnc: 0.5 g/dL (ref 0.4–1.8)
Globulin, Total: 2.5 g/dL (ref 2.2–3.9)
IgA: 38 mg/dL — ABNORMAL LOW (ref 90–386)
IgG (Immunoglobin G), Serum: 561 mg/dL — ABNORMAL LOW (ref 603–1613)
IgM (Immunoglobulin M), Srm: 20 mg/dL (ref 20–172)
Total Protein ELP: 6.1 g/dL (ref 6.0–8.5)

## 2022-04-23 MED ORDER — PROCHLORPERAZINE MALEATE 10 MG PO TABS
10.0000 mg | ORAL_TABLET | Freq: Once | ORAL | Status: AC
  Administered 2022-04-23: 10 mg via ORAL
  Filled 2022-04-23: qty 1

## 2022-04-23 MED ORDER — BORTEZOMIB CHEMO SQ INJECTION 3.5 MG (2.5MG/ML)
1.3000 mg/m2 | Freq: Once | INTRAMUSCULAR | Status: AC
  Administered 2022-04-23: 3 mg via SUBCUTANEOUS
  Filled 2022-04-23: qty 1.2

## 2022-04-23 NOTE — Patient Instructions (Signed)
[facility] [facility] [place] [facility]  Discharge Instructions: Thank you for choosing [facility] to provide your oncology and hematology care.  If you have a lab appointment with the Cancer Center, please go directly to the Cancer Center and check in at the registration area.  Wear comfortable clothing and clothing appropriate for easy access to any Portacath or PICC line.   We strive to give you quality time with your provider. You may need to reschedule your appointment if you arrive late (15 or more minutes).  Arriving late affects you and other patients whose appointments are after yours.  Also, if you miss three or more appointments without notifying the office, you may be dismissed from the clinic at the provider's discretion.      For prescription refill requests, have your pharmacy contact our office and allow 72 hours for refills to be completed.    Today you received the following chemotherapy and/or immunotherapy agents Velcade.      To help prevent nausea and vomiting after your treatment, we encourage you to take your nausea medication as directed.  BELOW ARE SYMPTOMS THAT SHOULD BE REPORTED IMMEDIATELY: *FEVER GREATER THAN 100.4 F (38 C) OR HIGHER *CHILLS OR SWEATING *NAUSEA AND VOMITING THAT IS NOT CONTROLLED WITH YOUR NAUSEA MEDICATION *UNUSUAL SHORTNESS OF BREATH *UNUSUAL BRUISING OR BLEEDING *URINARY PROBLEMS (pain or burning when urinating, or frequent urination) *BOWEL PROBLEMS (unusual diarrhea, constipation, pain near the anus) TENDERNESS IN MOUTH AND THROAT WITH OR WITHOUT PRESENCE OF ULCERS (sore throat, sores in mouth, or a toothache) UNUSUAL RASH, SWELLING OR PAIN  UNUSUAL VAGINAL DISCHARGE OR ITCHING   Items with * indicate a potential emergency and should be followed up as soon as possible or go to the Emergency Department if any problems should occur.  Please show the CHEMOTHERAPY ALERT CARD or IMMUNOTHERAPY ALERT CARD at check-in to  the Emergency Department and triage nurse.  Should you have questions after your visit or need to cancel or reschedule your appointment, please contact [facility] [place] [facility]  [phone] and follow the prompts.  Office hours are 8:00 a.m. to 4:30 p.m. Monday - Friday. Please note that voicemails left after 4:00 p.m. may not be returned until the following business day.  We are closed weekends and major holidays. You have access to a nurse at all times for urgent questions. Please call the main number to the clinic [phone] and follow the prompts.  For any non-urgent questions, you may also contact your provider using MyChart. We now offer e-Visits for anyone 18 and older to request care online for non-urgent symptoms. For details visit [email or web address].   Also download the MyChart app! Go to the app store, search "MyChart", open the app, select [place], and log in with your MyChart username and password.    

## 2022-04-27 ENCOUNTER — Inpatient Hospital Stay (HOSPITAL_BASED_OUTPATIENT_CLINIC_OR_DEPARTMENT_OTHER): Payer: Commercial Managed Care - PPO | Admitting: Nurse Practitioner

## 2022-04-27 ENCOUNTER — Inpatient Hospital Stay: Payer: Commercial Managed Care - PPO

## 2022-04-27 ENCOUNTER — Other Ambulatory Visit: Payer: Self-pay | Admitting: Oncology

## 2022-04-27 ENCOUNTER — Other Ambulatory Visit: Payer: Self-pay

## 2022-04-27 VITALS — BP 109/83 | HR 107 | Temp 97.5°F | Resp 16 | Wt 210.5 lb

## 2022-04-27 DIAGNOSIS — C9000 Multiple myeloma not having achieved remission: Secondary | ICD-10-CM

## 2022-04-27 DIAGNOSIS — L089 Local infection of the skin and subcutaneous tissue, unspecified: Secondary | ICD-10-CM | POA: Diagnosis not present

## 2022-04-27 DIAGNOSIS — Z5112 Encounter for antineoplastic immunotherapy: Secondary | ICD-10-CM | POA: Diagnosis not present

## 2022-04-27 LAB — CBC WITH DIFFERENTIAL/PLATELET
Abs Immature Granulocytes: 0.01 10*3/uL (ref 0.00–0.07)
Basophils Absolute: 0 10*3/uL (ref 0.0–0.1)
Basophils Relative: 0 %
Eosinophils Absolute: 0.1 10*3/uL (ref 0.0–0.5)
Eosinophils Relative: 2 %
HCT: 37.2 % — ABNORMAL LOW (ref 39.0–52.0)
Hemoglobin: 12.3 g/dL — ABNORMAL LOW (ref 13.0–17.0)
Immature Granulocytes: 0 %
Lymphocytes Relative: 17 %
Lymphs Abs: 0.9 10*3/uL (ref 0.7–4.0)
MCH: 26.4 pg (ref 26.0–34.0)
MCHC: 33.1 g/dL (ref 30.0–36.0)
MCV: 79.8 fL — ABNORMAL LOW (ref 80.0–100.0)
Monocytes Absolute: 0.7 10*3/uL (ref 0.1–1.0)
Monocytes Relative: 15 %
Neutro Abs: 3.2 10*3/uL (ref 1.7–7.7)
Neutrophils Relative %: 66 %
Platelets: 217 10*3/uL (ref 150–400)
RBC: 4.66 MIL/uL (ref 4.22–5.81)
RDW: 16.6 % — ABNORMAL HIGH (ref 11.5–15.5)
WBC: 4.9 10*3/uL (ref 4.0–10.5)
nRBC: 0 % (ref 0.0–0.2)

## 2022-04-27 LAB — COMPREHENSIVE METABOLIC PANEL
ALT: 14 U/L (ref 0–44)
AST: 16 U/L (ref 15–41)
Albumin: 4.3 g/dL (ref 3.5–5.0)
Alkaline Phosphatase: 68 U/L (ref 38–126)
Anion gap: 10 (ref 5–15)
BUN: 8 mg/dL (ref 6–20)
CO2: 24 mmol/L (ref 22–32)
Calcium: 9.2 mg/dL (ref 8.9–10.3)
Chloride: 101 mmol/L (ref 98–111)
Creatinine, Ser: 0.87 mg/dL (ref 0.61–1.24)
GFR, Estimated: >60 mL/min (ref >60)
Glucose, Bld: 100 mg/dL — ABNORMAL HIGH (ref 70–99)
Potassium: 3.2 mmol/L — ABNORMAL LOW (ref 3.5–5.1)
Sodium: 135 mmol/L (ref 135–145)
Total Bilirubin: 1.2 mg/dL (ref 0.3–1.2)
Total Protein: 6.9 g/dL (ref 6.5–8.1)

## 2022-04-27 MED ORDER — PROCHLORPERAZINE MALEATE 10 MG PO TABS
10.0000 mg | ORAL_TABLET | Freq: Four times a day (QID) | ORAL | Status: DC | PRN
  Administered 2022-04-27: 10 mg via ORAL
  Filled 2022-04-27: qty 1

## 2022-04-27 MED ORDER — BORTEZOMIB CHEMO SQ INJECTION 3.5 MG (2.5MG/ML)
1.3000 mg/m2 | Freq: Once | INTRAMUSCULAR | Status: AC
  Administered 2022-04-27: 3 mg via SUBCUTANEOUS
  Filled 2022-04-27: qty 1.2

## 2022-04-27 MED ORDER — DOXYCYCLINE HYCLATE 100 MG PO TABS: 100.0000 mg | ORAL_TABLET | Freq: Two times a day (BID) | ORAL | 0 refills | Status: AC

## 2022-04-27 MED ORDER — DEXAMETHASONE 4 MG PO TABS
20.0000 mg | ORAL_TABLET | Freq: Once | ORAL | Status: AC
  Administered 2022-04-27: 20 mg via ORAL
  Filled 2022-04-27: qty 5

## 2022-04-27 NOTE — Patient Instructions (Signed)
[facility] [facility] [place] [facility]  Discharge Instructions: Thank you for choosing [facility] to provide your oncology and hematology care.  If you have a lab appointment with the Cancer Center, please go directly to the Cancer Center and check in at the registration area.  Wear comfortable clothing and clothing appropriate for easy access to any Portacath or PICC line.   We strive to give you quality time with your provider. You may need to reschedule your appointment if you arrive late (15 or more minutes).  Arriving late affects you and other patients whose appointments are after yours.  Also, if you miss three or more appointments without notifying the office, you may be dismissed from the clinic at the provider's discretion.      For prescription refill requests, have your pharmacy contact our office and allow 72 hours for refills to be completed.    Today you received the following chemotherapy and/or immunotherapy agents Velcade.      To help prevent nausea and vomiting after your treatment, we encourage you to take your nausea medication as directed.  BELOW ARE SYMPTOMS THAT SHOULD BE REPORTED IMMEDIATELY: *FEVER GREATER THAN 100.4 F (38 C) OR HIGHER *CHILLS OR SWEATING *NAUSEA AND VOMITING THAT IS NOT CONTROLLED WITH YOUR NAUSEA MEDICATION *UNUSUAL SHORTNESS OF BREATH *UNUSUAL BRUISING OR BLEEDING *URINARY PROBLEMS (pain or burning when urinating, or frequent urination) *BOWEL PROBLEMS (unusual diarrhea, constipation, pain near the anus) TENDERNESS IN MOUTH AND THROAT WITH OR WITHOUT PRESENCE OF ULCERS (sore throat, sores in mouth, or a toothache) UNUSUAL RASH, SWELLING OR PAIN  UNUSUAL VAGINAL DISCHARGE OR ITCHING   Items with * indicate a potential emergency and should be followed up as soon as possible or go to the Emergency Department if any problems should occur.  Please show the CHEMOTHERAPY ALERT CARD or IMMUNOTHERAPY ALERT CARD at check-in to  the Emergency Department and triage nurse.  Should you have questions after your visit or need to cancel or reschedule your appointment, please contact [facility] [place] [facility]  [phone] and follow the prompts.  Office hours are 8:00 a.m. to 4:30 p.m. Monday - Friday. Please note that voicemails left after 4:00 p.m. may not be returned until the following business day.  We are closed weekends and major holidays. You have access to a nurse at all times for urgent questions. Please call the main number to the clinic [phone] and follow the prompts.  For any non-urgent questions, you may also contact your provider using MyChart. We now offer e-Visits for anyone 18 and older to request care online for non-urgent symptoms. For details visit [email or web address].   Also download the MyChart app! Go to the app store, search "MyChart", open the app, select [place], and log in with your MyChart username and password.    

## 2022-04-27 NOTE — Progress Notes (Signed)
Sympt[facility] Management Clinic  [facility] at [facility] A Department of the [name]. [facility] [address] [place], [place] [address] [phone] (phone) [phone] (fax)  Patient Care Team: Pcp, No as PCP - General   Name of the patient: Tanner [name]  [id number]  11-Aug-1985   Date of visit: 04/27/22  Diagnosis- Multiple Myeloma  Chief complaint/ Reason for visit- swollen finger/hang nail  Heme/Onc history:  Oncology History  Multiple myeloma not having achieved remission  08/14/2021 Imaging   CT chest abdomen pelvis without contrast Showed numerous lucencies throughout the bone structures, most pronounced throughout the spine and left left iliac bone.  Otherwise no acute findings in the chest abdomen or pelvis.  Sigmoid diverticulosis.   08/19/2021 Initial Diagnosis   Multiple myeloma not having achieved remission, stage II  -08/15/2021, bone marrow biopsy showed plasma cell myeloma involving greater than 80% of the marrow. Cytogenetics Myeloma  FISH   08/20/2021 Cancer Staging   Staging form: Plasma Cell Myeloma and Plasma Cell Disorders, AJCC 8th Edition - Clinical stage from 08/20/2021: RISS Stage II (Beta-2-microglobulin (mg/L): 3.6, Albumin (g/dL): 2.2, ISS: Stage II, High-risk cytogenetics: Absent, LDH: Normal) - Signed by Rickard Patience, MD on 10/14/2021 Beta 2 microglobulin range (mg/L): 3.5 to 5.49 Albumin range (g/dL): Less than 3.5 Cytogenetics: No abnormalities   08/20/2021 -  Chemotherapy   MYELOMA NEWLY DIAGNOSED TRANSPLANT CANDIDATE DaraVRd (Daratumumab SUBQ) q21d x 6 Cycles (Induction/Consolidation)      09/11/2021 Imaging   PET scan showed  1. Extensive multifocal hypermetabolic disease throughout the axial and appendicular skeleton, with numerous lucent osseous lesions,consistent with patient's known diagnosis of multiple myeloma. 2. No tracer avid lymph node or mass identified to suggest soft tissue  plasmacytoma   12/18/2021 Imaging   MRI lumbar 1. Multiple scattered enhancing marrow replacing bone lesions within the imaged spine and pelvis consistent with known history of multiple myeloma. No pathologic fracture or extraosseous soft tissue component. 2. Unchanged mild multilevel degenerative changes of the lumbar spine resulting in mild canal stenosis at L2-L3 and L3-L4.  3. Left greater than right subarticular recess stenosis and mild left foraminal stenosis at L5-S1.        Interval history- Patient is 37 year old male currently undergoing Dara- VRD chemotherapy to bridge to bone marrow biopsy for possible transplant who [facility] to [facility] for complaints of swollen pinky finger of left hand. He had hang nail that has now become painful and swollen. No fevers or chills. Says this has happened in the past and symptoms resolved on their own.   Review of systems- Review of Systems  Constitutional:  Negative for chills, fever and malaise/fatigue.     No Known Allergies  Past Medical History:  Diagnosis Date   Alcohol use    Sciatica    Smoking     Past Surgical History:  Procedure Laterality Date   INGUINAL HERNIA REPAIR      Social History   Socioeconomic History   Marital status: Single    Spouse name: Not on file   Number of children: Not on file   Years of education: Not on file   Highest education level: Not on file  Occupational History   Not on file  Tobacco Use   Smoking status: Former    Packs/day: 0.50    Years: 10.00    Additional pack years: 0.00    Total pack years: 5.00    Types: E-cigarettes, Cigarettes  Quit date: 08/11/2021    Years since quitting: 0.7   Smokeless tobacco: Never  Vaping Use   Vaping Use: Every day  Substance and Sexual Activity   Alcohol use: Not Currently    Alcohol/week: 24.0 standard drinks of alcohol    Types: 24 Cans of beer per week    Comment: quit drinking approx 08/11/2021   Drug use: Never    Sexual activity: Not on file  Other Topics Concern   Not on file  Social History Narrative   Not on file   Social Determinants of Health   Financial Resource Strain: Not on file  Food Insecurity: Not on file  Transportation Needs: Not on file  Physical Activity: Not on file  Stress: Not on file  Social Connections: Not on file  Intimate Partner Violence: Not on file    Family History  Problem Relation Age of Onset   Ovarian cancer Mother    Asthma Mother    Alcoholism Father    Throat cancer Maternal Uncle    Heart disease Maternal Uncle    Lung cancer Maternal Uncle    Lymphoma Maternal Uncle    Lung cancer Paternal Grandmother      Current Outpatient Medications:    acyclovir (ZOVIRAX) 400 MG tablet, Take 1 tablet (400 mg total) by mouth 2 (two) times daily., Disp: 60 tablet, Rfl: 5   allopurinol (ZYLOPRIM) 100 MG tablet, Take 0.5 tablets (50 mg total) by mouth daily., Disp: 30 tablet, Rfl: 2   aspirin EC 81 MG tablet, Take 81 mg by mouth daily. Swallow whole., Disp: , Rfl:    dexamethasone (DECADRON) 4 MG tablet, Take 5 tablets (20 mg total) by mouth See admin instructions. take dexamethasone 20 mg, on day 2, 9,15., Disp: 60 tablet, Rfl: 0   KLOR-CON M20 20 MEQ tablet, TAKE 1 TABLET BY MOUTH EVERY DAY, Disp: 30 tablet, Rfl: 0   lenalidomide (REVLIMID) 25 MG capsule, Take 1 capsule (25 mg total) by mouth daily. Take for 14 days, then hold for 7 days. Repeat every 21 days., Disp: 14 capsule, Rfl: 0   OLANZapine (ZYPREXA) 5 MG tablet, Take 1 tablet (5 mg total) by mouth at bedtime., Disp: 30 tablet, Rfl: 1   ondansetron (ZOFRAN) 8 MG tablet, Take 1 tablet (8 mg total) by mouth 2 (two) times daily as needed for nausea or vomiting., Disp: 20 tablet, Rfl: 2   senna-docusate (SENOKOT-S) 8.6-50 MG tablet, Take 1 tablet by mouth daily., Disp: 60 tablet, Rfl: 2   traMADol (ULTRAM) 50 MG tablet, Take 1 tablet (50 mg total) by mouth every 6 (six) hours as needed., Disp: 30 tablet, Rfl:  0 No current facility-administered medications for this visit.  Facility-Administered Medications Ordered in Other Visits:    bortezomib SQ (VELCADE) chemo injection (2.5mg /mL concentration) 3 mg, 1.3 mg/m2 (Treatment Plan Recorded), Subcutaneous, Once, Rickard Patience, MD   dexamethasone (DECADRON) tablet 20 mg, 20 mg, Oral, Once, Rickard Patience, MD   prochlorperazine (COMPAZINE) tablet 10 mg, 10 mg, Oral, Q6H PRN, Rickard Patience, MD  Physical exam: There were no vitals filed for this visit. Physical Exam Constitutional:      Appearance: He is not ill-appearing.  Musculoskeletal:     Left hand: Swelling present.     Comments: Left pinky finger, tender, warm, erythematous  Skin:    General: Skin is warm.  Neurological:     Mental Status: He is alert.       No results found.  Assessment and plan-  Patient is a 37 y.o. male   Skin infection- suspect damaged cuticle has led to secondary infection. High risk d/t chemotherapy. Recommend oral antibiotics. Start doxycycline 100 mg BID x 7 days. Can trial warm water and mild soap soaks several times a day. Consider evaluation with urgent care vs derm for additional management.   Multiple myeloma- ok to proceed with treatment today.   Disposition:  Rtc if symptoms odn't improve or worsen in interim, otherwise, follow up with Dr Cathie Hoops as scheduled.    Visit Diagnosis 1. Local skin infection    Patient expressed understanding and was in agreement with this plan. He also understands that He can call clinic at any time with any questions, concerns, or complaints.   Thank you for allowing me to participate in the care of this very pleasant patient.   Consuello Masse, DNP, AGNP-C, AOCNP Cancer Center at [facility] [phone]

## 2022-04-28 LAB — BETA 2 MICROGLOBULIN, SERUM: Beta-2 Microglobulin: 1.4 mg/L (ref 0.6–2.4)

## 2022-04-29 ENCOUNTER — Ambulatory Visit: Payer: Commercial Managed Care - PPO

## 2022-04-29 ENCOUNTER — Other Ambulatory Visit: Payer: Commercial Managed Care - PPO

## 2022-04-30 ENCOUNTER — Telehealth: Payer: Self-pay | Admitting: *Deleted

## 2022-04-30 ENCOUNTER — Inpatient Hospital Stay: Payer: Commercial Managed Care - PPO

## 2022-04-30 ENCOUNTER — Inpatient Hospital Stay (HOSPITAL_BASED_OUTPATIENT_CLINIC_OR_DEPARTMENT_OTHER): Payer: Commercial Managed Care - PPO | Admitting: Nurse Practitioner

## 2022-04-30 VITALS — BP 124/87 | HR 57 | Temp 97.6°F | Resp 20

## 2022-04-30 DIAGNOSIS — C9000 Multiple myeloma not having achieved remission: Secondary | ICD-10-CM

## 2022-04-30 DIAGNOSIS — L03012 Cellulitis of left finger: Secondary | ICD-10-CM

## 2022-04-30 DIAGNOSIS — Z5112 Encounter for antineoplastic immunotherapy: Secondary | ICD-10-CM | POA: Diagnosis not present

## 2022-04-30 LAB — CBC WITH DIFFERENTIAL/PLATELET
Abs Immature Granulocytes: 0.02 10*3/uL (ref 0.00–0.07)
Basophils Absolute: 0 10*3/uL (ref 0.0–0.1)
Basophils Relative: 0 %
Eosinophils Absolute: 0 10*3/uL (ref 0.0–0.5)
Eosinophils Relative: 1 %
HCT: 35.4 % — ABNORMAL LOW (ref 39.0–52.0)
Hemoglobin: 11.8 g/dL — ABNORMAL LOW (ref 13.0–17.0)
Immature Granulocytes: 1 %
Lymphocytes Relative: 29 %
Lymphs Abs: 1.2 10*3/uL (ref 0.7–4.0)
MCH: 26.5 pg (ref 26.0–34.0)
MCHC: 33.3 g/dL (ref 30.0–36.0)
MCV: 79.4 fL — ABNORMAL LOW (ref 80.0–100.0)
Monocytes Absolute: 0.7 10*3/uL (ref 0.1–1.0)
Monocytes Relative: 19 %
Neutro Abs: 2 10*3/uL (ref 1.7–7.7)
Neutrophils Relative %: 50 %
Platelets: 176 10*3/uL (ref 150–400)
RBC: 4.46 MIL/uL (ref 4.22–5.81)
RDW: 16.4 % — ABNORMAL HIGH (ref 11.5–15.5)
WBC: 3.9 10*3/uL — ABNORMAL LOW (ref 4.0–10.5)
nRBC: 0.5 % — ABNORMAL HIGH (ref 0.0–0.2)

## 2022-04-30 LAB — COMPREHENSIVE METABOLIC PANEL
ALT: 17 U/L (ref 0–44)
AST: 15 U/L (ref 15–41)
Albumin: 3.8 g/dL (ref 3.5–5.0)
Alkaline Phosphatase: 65 U/L (ref 38–126)
Anion gap: 7 (ref 5–15)
BUN: 12 mg/dL (ref 6–20)
CO2: 25 mmol/L (ref 22–32)
Calcium: 9.1 mg/dL (ref 8.9–10.3)
Chloride: 105 mmol/L (ref 98–111)
Creatinine, Ser: 0.94 mg/dL (ref 0.61–1.24)
GFR, Estimated: >60 mL/min (ref >60)
Glucose, Bld: 97 mg/dL (ref 70–99)
Potassium: 3.3 mmol/L — ABNORMAL LOW (ref 3.5–5.1)
Sodium: 137 mmol/L (ref 135–145)
Total Bilirubin: 0.6 mg/dL (ref 0.3–1.2)
Total Protein: 6.5 g/dL (ref 6.5–8.1)

## 2022-04-30 MED ORDER — PROCHLORPERAZINE MALEATE 10 MG PO TABS
10.0000 mg | ORAL_TABLET | Freq: Once | ORAL | Status: AC
  Administered 2022-04-30: 10 mg via ORAL
  Filled 2022-04-30: qty 1

## 2022-04-30 MED ORDER — DEXAMETHASONE 4 MG PO TABS
8.0000 mg | ORAL_TABLET | Freq: Once | ORAL | Status: AC
  Administered 2022-04-30: 8 mg via ORAL
  Filled 2022-04-30: qty 2

## 2022-04-30 MED ORDER — BORTEZOMIB CHEMO SQ INJECTION 3.5 MG (2.5MG/ML)
1.3000 mg/m2 | Freq: Once | INTRAMUSCULAR | Status: AC
  Administered 2022-04-30: 3 mg via SUBCUTANEOUS
  Filled 2022-04-30: qty 1.2

## 2022-04-30 NOTE — Patient Instructions (Signed)
[facility] [place] [facility]  Discharge Instructions: Thank you for choosing [facility] to provide your oncology and hematology care.  If you have a lab appointment with the Cancer Center, please go directly to the Cancer Center and check in at the registration area.  Wear comfortable clothing and clothing appropriate for easy access to any Portacath or PICC line.   We strive to give you quality time with your provider. You may need to reschedule your appointment if you arrive late (15 or more minutes).  Arriving late affects you and other patients whose appointments are after yours.  Also, if you miss three or more appointments without notifying the office, you may be dismissed from the clinic at the provider's discretion.      For prescription refill requests, have your pharmacy contact our office and allow 72 hours for refills to be completed.    Today you received the following chemotherapy and/or immunotherapy agents Velcade.      To help prevent nausea and vomiting after your treatment, we encourage you to take your nausea medication as directed.  BELOW ARE SYMPTOMS THAT SHOULD BE REPORTED IMMEDIATELY: *FEVER GREATER THAN 100.4 F (38 C) OR HIGHER *CHILLS OR SWEATING *NAUSEA AND VOMITING THAT IS NOT CONTROLLED WITH YOUR NAUSEA MEDICATION *UNUSUAL SHORTNESS OF BREATH *UNUSUAL BRUISING OR BLEEDING *URINARY PROBLEMS (pain or burning when urinating, or frequent urination) *BOWEL PROBLEMS (unusual diarrhea, constipation, pain near the anus) TENDERNESS IN MOUTH AND THROAT WITH OR WITHOUT PRESENCE OF ULCERS (sore throat, sores in mouth, or a toothache) UNUSUAL RASH, SWELLING OR PAIN  UNUSUAL VAGINAL DISCHARGE OR ITCHING   Items with * indicate a potential emergency and should be followed up as soon as possible or go to the Emergency Department if any problems should occur.  Please show the CHEMOTHERAPY ALERT CARD or IMMUNOTHERAPY ALERT CARD at check-in to  the Emergency Department and triage nurse.  Should you have questions after your visit or need to cancel or reschedule your appointment, please contact [facility] [place] [facility]  [phone] and follow the prompts.  Office hours are 8:00 a.m. to 4:30 p.m. Monday - Friday. Please note that voicemails left after 4:00 p.m. may not be returned until the following business day.  We are closed weekends and major holidays. You have access to a nurse at all times for urgent questions. Please call the main number to the clinic [phone] and follow the prompts.  For any non-urgent questions, you may also contact your provider using MyChart. We now offer e-Visits for anyone 18 and older to request care online for non-urgent symptoms. For details visit [email or web address].   Also download the MyChart app! Go to the app store, search "MyChart", open the app, select [facility], and log in with your MyChart username and password.    

## 2022-04-30 NOTE — Telephone Encounter (Signed)
Call from [facility] asking why patient Revlimid is on hold so they know if they need to complete paperwork on it. Lease return their call

## 2022-04-30 NOTE — Progress Notes (Signed)
Symptom Management Clinic  [facility] at [facility] A Department of the [place]. [facility] [address], [address] [place], [place] [address] [phone] (phone) [phone] (fax)  Patient Care Team: Pcp, No as PCP - General   Name of the patient: Tanner [name]  [id number]  1985-05-31   Date of visit: 04/30/22  Diagnosis- Multiple Myeloma  Chief complaint/ Reason for visit- swollen finger/hang nail  Heme/Onc history:  Oncology History  Multiple myeloma not having achieved remission  08/14/2021 Imaging   CT chest abdomen pelvis without contrast Showed numerous lucencies throughout the bone structures, most pronounced throughout the spine and left left iliac bone.  Otherwise no acute findings in the chest abdomen or pelvis.  Sigmoid diverticulosis.   08/19/2021 Initial Diagnosis   Multiple myeloma not having achieved remission, stage II  -08/15/2021, bone marrow biopsy showed plasma cell myeloma involving greater than 80% of the marrow. Cytogenetics Myeloma  FISH   08/20/2021 Cancer Staging   Staging form: Plasma Cell Myeloma and Plasma Cell Disorders, AJCC 8th Edition - Clinical stage from 08/20/2021: RISS Stage II (Beta-2-microglobulin (mg/L): 3.6, Albumin (g/dL): 2.2, ISS: Stage II, High-risk cytogenetics: Absent, LDH: Normal) - Signed by Rickard Patience, MD on 10/14/2021 Beta 2 microglobulin range (mg/L): 3.5 to 5.49 Albumin range (g/dL): Less than 3.5 Cytogenetics: No abnormalities   08/20/2021 -  Chemotherapy   MYELOMA NEWLY DIAGNOSED TRANSPLANT CANDIDATE DaraVRd (Daratumumab SUBQ) q21d x 6 Cycles (Induction/Consolidation)      09/11/2021 Imaging   PET scan showed  1. Extensive multifocal hypermetabolic disease throughout the axial and appendicular skeleton, with numerous lucent osseous lesions,consistent with patient's known diagnosis of multiple myeloma. 2. No tracer avid lymph node or mass identified to suggest soft tissue  plasmacytoma   12/18/2021 Imaging   MRI lumbar 1. Multiple scattered enhancing marrow replacing bone lesions within the imaged spine and pelvis consistent with known history of multiple myeloma. No pathologic fracture or extraosseous soft tissue component. 2. Unchanged mild multilevel degenerative changes of the lumbar spine resulting in mild canal stenosis at L2-L3 and L3-L4.  3. Left greater than right subarticular recess stenosis and mild left foraminal stenosis at L5-S1.        Interval history- Patient is 37 year old male currently undergoing Dara- VRD chemotherapy to bridge to bone marrow biopsy for possible transplant who re-presents to [facility] Clinic for worsening swelling of left pinky finger. Now appears to have an abscess forming. No fevers or chills. He's taken 3 doses of doxycycline.   Review of systems- Review of Systems  Constitutional:  Negative for chills, fever and malaise/fatigue.     No Known Allergies  Past Medical History:  Diagnosis Date   Alcohol use    Sciatica    Smoking     Past Surgical History:  Procedure Laterality Date   INGUINAL HERNIA REPAIR      Social History   Socioeconomic History   Marital status: Single    Spouse name: Not on file   Number of children: Not on file   Years of education: Not on file   Highest education level: Not on file  Occupational History   Not on file  Tobacco Use   Smoking status: Former    Packs/day: 0.50    Years: 10.00    Additional pack years: 0.00    Total pack years: 5.00    Types: E-cigarettes, Cigarettes    Quit date: 08/11/2021    Years since quitting: 0.7  Smokeless tobacco: Never  Vaping Use   Vaping Use: Every day  Substance and Sexual Activity   Alcohol use: Not Currently    Alcohol/week: 24.0 standard drinks of alcohol    Types: 24 Cans of beer per week    Comment: quit drinking approx 08/11/2021   Drug use: Never   Sexual activity: Not on file  Other Topics Concern   Not on  file  Social History Narrative   Not on file   Social Determinants of Health   Financial Resource Strain: Not on file  Food Insecurity: Not on file  Transportation Needs: Not on file  Physical Activity: Not on file  Stress: Not on file  Social Connections: Not on file  Intimate Partner Violence: Not on file    Family History  Problem Relation Age of Onset   Ovarian cancer Mother    Asthma Mother    Alcoholism Father    Throat cancer Maternal Uncle    Heart disease Maternal Uncle    Lung cancer Maternal Uncle    Lymphoma Maternal Uncle    Lung cancer Paternal Grandmother      Current Outpatient Medications:    acyclovir (ZOVIRAX) 400 MG tablet, Take 1 tablet (400 mg total) by mouth 2 (two) times daily., Disp: 60 tablet, Rfl: 5   allopurinol (ZYLOPRIM) 100 MG tablet, Take 0.5 tablets (50 mg total) by mouth daily., Disp: 30 tablet, Rfl: 2   aspirin EC 81 MG tablet, Take 81 mg by mouth daily. Swallow whole., Disp: , Rfl:    dexamethasone (DECADRON) 4 MG tablet, Take 5 tablets (20 mg total) by mouth See admin instructions. take dexamethasone 20 mg, on day 2, 9,15., Disp: 60 tablet, Rfl: 0   doxycycline (VIBRA-TABS) 100 MG tablet, Take 1 tablet (100 mg total) by mouth 2 (two) times daily for 7 days., Disp: 14 tablet, Rfl: 0   KLOR-CON M20 20 MEQ tablet, TAKE 1 TABLET BY MOUTH EVERY DAY, Disp: 30 tablet, Rfl: 0   lenalidomide (REVLIMID) 25 MG capsule, Take 1 capsule (25 mg total) by mouth daily. Take for 14 days, then hold for 7 days. Repeat every 21 days., Disp: 14 capsule, Rfl: 0   OLANZapine (ZYPREXA) 5 MG tablet, Take 1 tablet (5 mg total) by mouth at bedtime., Disp: 30 tablet, Rfl: 1   ondansetron (ZOFRAN) 8 MG tablet, Take 1 tablet (8 mg total) by mouth 2 (two) times daily as needed for nausea or vomiting., Disp: 20 tablet, Rfl: 2   senna-docusate (SENOKOT-S) 8.6-50 MG tablet, Take 1 tablet by mouth daily., Disp: 60 tablet, Rfl: 2   traMADol (ULTRAM) 50 MG tablet, Take 1 tablet  (50 mg total) by mouth every 6 (six) hours as needed., Disp: 30 tablet, Rfl: 0  Physical exam: There were no vitals filed for this visit. Physical Exam Constitutional:      Appearance: He is not ill-appearing.  Musculoskeletal:     Left hand: Swelling present.     Comments: Left pinky finger, tender, warm, erythematous  Skin:    General: Skin is warm.  Neurological:     Mental Status: He is alert.      Day 1   today  No results found.  Assessment and plan- Patient is a 37 y.o. male   Acute paronychia- suspect damaged cuticle has led to secondary infection. High risk d/t chemotherapy. Currently on 3rd dose of doxycycline with worsening symptoms. Recommend evaluation with urgent care for I&D. Suspect he will need extended course of  antibiotics. Can be managed by UC or CC. Patient will notify clinic once he is evaluated.   Multiple myeloma- he has now completed his treatments and is awaiting BMT.   Disposition:  Rtc as needed.    Visit Diagnosis 1. Acute paronychia of finger, left    Patient expressed understanding and was in agreement with this plan. He also understands that He can call clinic at any time with any questions, concerns, or complaints.   Thank you for allowing me to participate in the care of this very pleasant patient.   Consuello Masse, DNP, AGNP-C, AOCNP Cancer Center at [facility] [phone]

## 2022-04-30 NOTE — Telephone Encounter (Signed)
Is this a new message? They called yesterday and spoke to Zella Ball, she informed them that it was on hold due to upcoming bone marrow transplant

## 2022-05-01 ENCOUNTER — Encounter: Payer: Self-pay | Admitting: Oncology

## 2022-05-01 NOTE — Telephone Encounter (Signed)
Called and spoke to [facility] with [facility].

## 2022-05-26 ENCOUNTER — Other Ambulatory Visit: Payer: Self-pay

## 2022-06-06 ENCOUNTER — Other Ambulatory Visit: Payer: Self-pay

## 2022-06-12 ENCOUNTER — Telehealth: Payer: Self-pay | Admitting: *Deleted

## 2022-06-12 ENCOUNTER — Other Ambulatory Visit: Payer: Self-pay | Admitting: *Deleted

## 2022-06-12 DIAGNOSIS — C9000 Multiple myeloma not having achieved remission: Secondary | ICD-10-CM

## 2022-06-12 NOTE — Telephone Encounter (Signed)
For Monday 5/10- Pt on app's schedule w/o any lab encounter/orders- ref back by [facility].  I asked Dr. Cathie Hoops about lab orders, Dr. Cathie Hoops requested to change pt to her schedule and order a cbc/metc/mag. Apts changed and I called pt to move the apts to am. Pt accepted apt at 1030 for labs and 1045 for Dr. Cathie Hoops.

## 2022-06-15 ENCOUNTER — Inpatient Hospital Stay: Payer: Commercial Managed Care - PPO

## 2022-06-15 ENCOUNTER — Inpatient Hospital Stay: Payer: Commercial Managed Care - PPO | Attending: Oncology | Admitting: Oncology

## 2022-06-15 ENCOUNTER — Encounter: Payer: Self-pay | Admitting: Oncology

## 2022-06-15 DIAGNOSIS — N189 Chronic kidney disease, unspecified: Secondary | ICD-10-CM | POA: Diagnosis not present

## 2022-06-15 DIAGNOSIS — C9000 Multiple myeloma not having achieved remission: Secondary | ICD-10-CM | POA: Insufficient documentation

## 2022-06-15 DIAGNOSIS — C7951 Secondary malignant neoplasm of bone: Secondary | ICD-10-CM

## 2022-06-15 DIAGNOSIS — Z79899 Other long term (current) drug therapy: Secondary | ICD-10-CM | POA: Diagnosis not present

## 2022-06-15 DIAGNOSIS — R000 Tachycardia, unspecified: Secondary | ICD-10-CM | POA: Insufficient documentation

## 2022-06-15 DIAGNOSIS — D709 Neutropenia, unspecified: Secondary | ICD-10-CM | POA: Insufficient documentation

## 2022-06-15 DIAGNOSIS — D708 Other neutropenia: Secondary | ICD-10-CM

## 2022-06-15 DIAGNOSIS — Z87891 Personal history of nicotine dependence: Secondary | ICD-10-CM | POA: Insufficient documentation

## 2022-06-15 LAB — CBC WITH DIFFERENTIAL (CANCER CENTER ONLY)
Abs Immature Granulocytes: 0.02 10*3/uL (ref 0.00–0.07)
Basophils Absolute: 0 10*3/uL (ref 0.0–0.1)
Basophils Relative: 2 %
Eosinophils Absolute: 0.1 10*3/uL (ref 0.0–0.5)
Eosinophils Relative: 4 %
HCT: 34.9 % — ABNORMAL LOW (ref 39.0–52.0)
Hemoglobin: 11.5 g/dL — ABNORMAL LOW (ref 13.0–17.0)
Immature Granulocytes: 1 %
Lymphocytes Relative: 30 %
Lymphs Abs: 0.7 10*3/uL (ref 0.7–4.0)
MCH: 26.5 pg (ref 26.0–34.0)
MCHC: 33 g/dL (ref 30.0–36.0)
MCV: 80.4 fL (ref 80.0–100.0)
Monocytes Absolute: 0.7 10*3/uL (ref 0.1–1.0)
Monocytes Relative: 28 %
Neutro Abs: 0.9 10*3/uL — ABNORMAL LOW (ref 1.7–7.7)
Neutrophils Relative %: 35 %
Platelet Count: 273 10*3/uL (ref 150–400)
RBC: 4.34 MIL/uL (ref 4.22–5.81)
RDW: 14.8 % (ref 11.5–15.5)
WBC Count: 2.4 10*3/uL — ABNORMAL LOW (ref 4.0–10.5)
nRBC: 0 % (ref 0.0–0.2)

## 2022-06-15 LAB — CMP (CANCER CENTER ONLY)
ALT: 25 U/L (ref 0–44)
AST: 20 U/L (ref 15–41)
Albumin: 4 g/dL (ref 3.5–5.0)
Alkaline Phosphatase: 58 U/L (ref 38–126)
Anion gap: 7 (ref 5–15)
BUN: 10 mg/dL (ref 6–20)
CO2: 24 mmol/L (ref 22–32)
Calcium: 9 mg/dL (ref 8.9–10.3)
Chloride: 107 mmol/L (ref 98–111)
Creatinine: 0.89 mg/dL (ref 0.61–1.24)
GFR, Estimated: >60 mL/min (ref >60)
Glucose, Bld: 113 mg/dL — ABNORMAL HIGH (ref 70–99)
Potassium: 3.7 mmol/L (ref 3.5–5.1)
Sodium: 138 mmol/L (ref 135–145)
Total Bilirubin: 0.3 mg/dL (ref 0.3–1.2)
Total Protein: 7 g/dL (ref 6.5–8.1)

## 2022-06-15 LAB — MAGNESIUM: Magnesium: 1.4 mg/dL — ABNORMAL LOW (ref 1.7–2.4)

## 2022-06-15 MED ORDER — MAGNESIUM SULFATE 4 GM/100ML IV SOLN
4.0000 g | Freq: Once | INTRAVENOUS | Status: AC
  Administered 2022-06-15: 4 g via INTRAVENOUS
  Filled 2022-06-15: qty 100

## 2022-06-15 MED ORDER — SODIUM CHLORIDE 0.9 % IV SOLN
Freq: Once | INTRAVENOUS | Status: AC
  Filled 2022-06-15: qty 250

## 2022-06-15 NOTE — Assessment & Plan Note (Signed)
Multifactorial, anxiety, decreased oral hydration. He received 1 L of normal saline for hydration today.  Encourage oral intake.

## 2022-06-15 NOTE — Assessment & Plan Note (Signed)
Plan to start bisphosphonate in the near future.

## 2022-06-15 NOTE — Assessment & Plan Note (Signed)
Patient will get IV magnesium 4 g today.

## 2022-06-15 NOTE — Assessment & Plan Note (Signed)
Avoid nephrotoxins, encourage oral hydration 

## 2022-06-15 NOTE — Progress Notes (Signed)
Hematology/Oncology Progress note Telephone:(336) [phone] Fax:(336) [phone]     CHIEF COMPLAINTS/PURPOSE OF CONSULTATION:  Multiple myeloma  ASSESSMENT & PLAN:   Cancer Staging  Multiple myeloma not having achieved remission (HCC) Staging form: Plasma Cell Myeloma and Plasma Cell Disorders, AJCC 8th Edition - Clinical stage from 08/20/2021: RISS Stage II (Beta-2-microglobulin (mg/L): 3.6, Albumin (g/dL): 2.2, ISS: Stage II, High-risk cytogenetics: Absent, LDH: Normal) - Signed by Rickard Patience, MD on 10/14/2021   Multiple myeloma not having achieved remission (HCC) # IgG lamda multiple myeloma, 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 Labs reviewed and discussed with him.   Continue follow-up with [facility] for posttransplant bone marrow biopsy, any decision of maintenance Weekly CBC, CMP, magnesium x 3 Transfuse Hgb for <7.5 and plts <10k  Hypomagnesemia Patient will get IV magnesium 4 g today.  Tachycardia Multifactorial, anxiety, decreased oral hydration. He received 1 L of normal saline for hydration today.  Encourage oral intake.   CKD (chronic kidney disease) Avoid nephrotoxins, encourage oral hydration  Metastatic cancer to bone [facility]) Plan to start bisphosphonate in the near future.   Neutropenia (HCC) ANC 0.9.  Discussed with patient by neutropenia precaution. He has [facility] appointment tomorrow-May need to get G-CSF there. Going forward, we will arrange possible G-CSF injection along with his lab check and appointments.    Follow up  Weekly lab IV fluid IV magnesium x 3. Follow-up in 4 weeks All questions were answered. The patient knows to call the clinic with any problems, questions or concerns. No barriers to learning was detected.  Rickard Patience, MD 06/15/2022    HISTORY OF PRESENTING ILLNESS:  Tanner [name] 37 y.o. male presents  for follow up of Multiple myeloma I have reviewed his  chart and materials related to his cancer extensively and collaborated history with the patient. Summary of oncologic history is as follows: Oncology History  Multiple myeloma not having achieved remission (HCC)  08/14/2021 Imaging   CT chest abdomen pelvis without contrast Showed numerous lucencies throughout the bone structures, most pronounced throughout the spine and left left iliac bone.  Otherwise no acute findings in the chest abdomen or pelvis.  Sigmoid diverticulosis.   08/19/2021 Initial Diagnosis   Multiple myeloma not having achieved remission, stage II  -08/15/2021, bone marrow biopsy showed plasma cell myeloma involving greater than 80% of the marrow. Cytogenetics Myeloma  FISH   08/20/2021 Cancer Staging   Staging form: Plasma Cell Myeloma and Plasma Cell Disorders, AJCC 8th Edition - Clinical stage from 08/20/2021: RISS Stage II (Beta-2-microglobulin (mg/L): 3.6, Albumin (g/dL): 2.2, ISS: Stage II, High-risk cytogenetics: Absent, LDH: Normal) - Signed by Rickard Patience, MD on 10/14/2021 Beta 2 microglobulin range (mg/L): 3.5 to 5.49 Albumin range (g/dL): Less than 3.5 Cytogenetics: No abnormalities   08/20/2021 -  Chemotherapy   MYELOMA NEWLY DIAGNOSED TRANSPLANT CANDIDATE DaraVRd (Daratumumab SUBQ) q21d x 6 Cycles (Induction/Consolidation)      09/11/2021 Imaging   PET scan showed  1. Extensive multifocal hypermetabolic disease throughout the axial and appendicular skeleton, with numerous lucent osseous lesions,consistent with patient's known diagnosis of multiple myeloma. 2. No tracer avid lymph node or mass identified to suggest soft tissue plasmacytoma   12/18/2021 Imaging   MRI lumbar 1. Multiple scattered enhancing marrow replacing bone lesions within the imaged spine and pelvis consistent with known history of multiple myeloma. No pathologic fracture or extraosseous soft tissue component. 2. Unchanged mild multilevel degenerative changes of the lumbar spine resulting in mild  canal stenosis at L2-L3 and L3-L4.  3. Left greater than right subarticular recess stenosis and mild left foraminal stenosis at L5-S1.      05/22/2022 Bone Marrow Transplant   Patient underwent autologous stem cell transplant. 05/30/2022, neutropenic fever.   06/03/2022 engraftment 06/04/2022, G-CSF stopped.      INTERVAL HISTORY Tanner [name] is a 37 y.o. male who has above history reviewed by me today presents for follow up visit for management of multiple myeloma Patient is status post autologous stem cell transplant.   He is doing well. He denies fever, chills, nausea vomiting diarrhea. He endorses some taste changes.  He does not hydrate himself well.  MEDICAL HISTORY:  Past Medical History:  Diagnosis Date   Alcohol use    Sciatica    Smoking     SURGICAL HISTORY: Past Surgical History:  Procedure Laterality Date   INGUINAL HERNIA REPAIR      SOCIAL HISTORY: Social History   Socioeconomic History   Marital status: Single    Spouse name: Not on file   Number of children: Not on file   Years of education: Not on file   Highest education level: Not on file  Occupational History   Not on file  Tobacco Use   Smoking status: Former    Packs/day: 0.50    Years: 10.00    Additional pack years: 0.00    Total pack years: 5.00    Types: E-cigarettes, Cigarettes    Quit date: 08/11/2021    Years since quitting: 0.8   Smokeless tobacco: Never  Vaping Use   Vaping Use: Every day  Substance and Sexual Activity   Alcohol use: Not Currently    Alcohol/week: 24.0 standard drinks of alcohol    Types: 24 Cans of beer per week    Comment: quit drinking approx 08/11/2021   Drug use: Never   Sexual activity: Not on file  Other Topics Concern   Not on file  Social History Narrative   Not on file   Social Determinants of Health   Financial Resource Strain: Not on file  Food Insecurity: Not on file  Transportation Needs: Not on file  Physical Activity: Not  on file  Stress: Not on file  Social Connections: Not on file  Intimate Partner Violence: Not on file    FAMILY HISTORY: Family History  Problem Relation Age of Onset   Ovarian cancer Mother    Asthma Mother    Alcoholism Father    Throat cancer Maternal Uncle    Heart disease Maternal Uncle    Lung cancer Maternal Uncle    Lymphoma Maternal Uncle    Lung cancer Paternal Grandmother     ALLERGIES:  has No Known Allergies.  MEDICATIONS:  Current Outpatient Medications  Medication Sig Dispense Refill   acyclovir (ZOVIRAX) 400 MG tablet Take 1 tablet (400 mg total) by mouth 2 (two) times daily. 60 tablet 5   ondansetron (ZOFRAN) 8 MG tablet Take 1 tablet (8 mg total) by mouth 2 (two) times daily as needed for nausea or vomiting. 20 tablet 2   pantoprazole (PROTONIX) 40 MG tablet Take by mouth.     allopurinol (ZYLOPRIM) 100 MG tablet Take 0.5 tablets (50 mg total) by mouth daily. (Patient not taking: Reported on 06/15/2022) 30 tablet 2   aspirin EC 81 MG tablet Take 81 mg by mouth daily. Swallow whole. (Patient not taking: Reported on 06/15/2022)     dexamethasone (DECADRON) 4 MG tablet Take 5 tablets (  20 mg total) by mouth See admin instructions. take dexamethasone 20 mg, on day 2, 9,15. (Patient not taking: Reported on 06/15/2022) 60 tablet 0   KLOR-CON M20 20 MEQ tablet TAKE 1 TABLET BY MOUTH EVERY DAY (Patient not taking: Reported on 06/15/2022) 30 tablet 0   lenalidomide (REVLIMID) 25 MG capsule Take 1 capsule (25 mg total) by mouth daily. Take for 14 days, then hold for 7 days. Repeat every 21 days. (Patient not taking: Reported on 06/15/2022) 14 capsule 0   OLANZapine (ZYPREXA) 5 MG tablet Take 1 tablet (5 mg total) by mouth at bedtime. (Patient not taking: Reported on 06/15/2022) 30 tablet 1   senna-docusate (SENOKOT-S) 8.6-50 MG tablet Take 1 tablet by mouth daily. (Patient not taking: Reported on 06/15/2022) 60 tablet 2   traMADol (ULTRAM) 50 MG tablet Take 1 tablet (50 mg total) by  mouth every 6 (six) hours as needed. (Patient not taking: Reported on 06/15/2022) 30 tablet 0   No current facility-administered medications for this visit.    Review of Systems  Constitutional:  Positive for appetite change and fatigue. Negative for chills, fever and unexpected weight change.  HENT:   Negative for hearing loss and voice change.   Eyes:  Negative for eye problems and icterus.  Respiratory:  Negative for chest tightness, cough and shortness of breath.   Cardiovascular:  Negative for chest pain and leg swelling.  Gastrointestinal:  Negative for abdominal distention and abdominal pain.  Endocrine: Negative for hot flashes.  Genitourinary:  Negative for difficulty urinating, dysuria and frequency.   Musculoskeletal:  Positive for back pain. Negative for arthralgias.  Skin:  Negative for itching and rash.  Neurological:  Negative for light-headedness and numbness.  Hematological:  Negative for adenopathy. Does not bruise/bleed easily.  Psychiatric/Behavioral:  Negative for confusion.      PHYSICAL EXAMINATION: ECOG PERFORMANCE STATUS: 1 - Symptomatic but completely ambulatory  Vitals:   06/15/22 1045  BP: 99/72  Pulse: (!) 115  Resp: 18  Temp: 97.6 F (36.4 C)  SpO2: 100%   Filed Weights   06/15/22 1045  Weight: 209 lb 12.8 oz (95.2 kg)    Physical Exam Constitutional:      General: He is not in acute distress.    Appearance: He is obese. He is not diaphoretic.  HENT:     Head: Normocephalic and atraumatic.     Mouth/Throat:     Pharynx: No oropharyngeal exudate.  Eyes:     General: No scleral icterus. Cardiovascular:     Rate and Rhythm: Normal rate.     Heart sounds: No murmur heard. Pulmonary:     Effort: Pulmonary effort is normal. No respiratory distress.  Abdominal:     General: There is no distension.     Palpations: Abdomen is soft.  Musculoskeletal:        General: Normal range of motion.     Cervical back: Normal range of motion.   Skin:    General: Skin is warm and dry.     Findings: No erythema.  Neurological:     Mental Status: He is alert and oriented to person, place, and time. Mental status is at baseline.     Cranial Nerves: No cranial nerve deficit.     Motor: No abnormal muscle tone.  Psychiatric:        Mood and Affect: Mood and affect normal.      LABORATORY DATA:  I have reviewed the data as listed    Latest  Ref Rng & Units 06/15/2022   10:22 AM 04/30/2022   10:26 AM 04/27/2022   10:07 AM  CBC  WBC 4.0 - 10.5 K/uL 2.4  3.9  4.9   Hemoglobin 13.0 - 17.0 g/dL 23.5  57.3  22.0   Hematocrit 39.0 - 52.0 % 34.9  35.4  37.2   Platelets 150 - 400 K/uL 273  176  217       Latest Ref Rng & Units 06/15/2022   10:22 AM 04/30/2022   10:26 AM 04/27/2022   10:07 AM  CMP  Glucose 70 - 99 mg/dL 254  97  270   BUN 6 - 20 mg/dL 10  12  8    Creatinine 0.61 - 1.24 mg/dL 6.23  7.62  8.31   Sodium 135 - 145 mmol/L 138  137  135   Potassium 3.5 - 5.1 mmol/L 3.7  3.3  3.2   Chloride 98 - 111 mmol/L 107  105  101   CO2 22 - 32 mmol/L 24  25  24    Calcium 8.9 - 10.3 mg/dL 9.0  9.1  9.2   Total Protein 6.5 - 8.1 g/dL 7.0  6.5  6.9   Total Bilirubin 0.3 - 1.2 mg/dL 0.3  0.6  1.2   Alkaline Phos 38 - 126 U/L 58  65  68   AST 15 - 41 U/L 20  15  16    ALT 0 - 44 U/L 25  17  14         RADIOGRAPHIC STUDIES: I have personally reviewed the radiological images as listed and agreed with the findings in the report. No results found.

## 2022-06-15 NOTE — Assessment & Plan Note (Addendum)
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 Labs reviewed and discussed with him.   Continue follow-up with [facility] for posttransplant bone marrow biopsy, any decision of maintenance Weekly CBC, CMP, magnesium x 3 Transfuse Hgb for <7.5 and plts <10k

## 2022-06-15 NOTE — Assessment & Plan Note (Signed)
ANC 0.9.  Discussed with patient by neutropenia precaution. He has [facility] appointment tomorrow-May need to get G-CSF there. Going forward, we will arrange possible G-CSF injection along with his lab check and appointments.

## 2022-06-16 ENCOUNTER — Other Ambulatory Visit: Payer: Self-pay

## 2022-06-19 DIAGNOSIS — D6959 Other secondary thrombocytopenia: Secondary | ICD-10-CM | POA: Insufficient documentation

## 2022-06-22 ENCOUNTER — Inpatient Hospital Stay: Payer: Commercial Managed Care - PPO

## 2022-06-22 ENCOUNTER — Ambulatory Visit: Payer: Commercial Managed Care - PPO

## 2022-06-22 DIAGNOSIS — E860 Dehydration: Secondary | ICD-10-CM

## 2022-06-22 DIAGNOSIS — C9000 Multiple myeloma not having achieved remission: Secondary | ICD-10-CM | POA: Diagnosis not present

## 2022-06-22 LAB — CMP (CANCER CENTER ONLY)
ALT: 23 U/L (ref 0–44)
AST: 20 U/L (ref 15–41)
Albumin: 4 g/dL (ref 3.5–5.0)
Alkaline Phosphatase: 61 U/L (ref 38–126)
Anion gap: 8 (ref 5–15)
BUN: 8 mg/dL (ref 6–20)
CO2: 25 mmol/L (ref 22–32)
Calcium: 9.1 mg/dL (ref 8.9–10.3)
Chloride: 104 mmol/L (ref 98–111)
Creatinine: 0.83 mg/dL (ref 0.61–1.24)
GFR, Estimated: >60 mL/min (ref >60)
Glucose, Bld: 108 mg/dL — ABNORMAL HIGH (ref 70–99)
Potassium: 3.5 mmol/L (ref 3.5–5.1)
Sodium: 137 mmol/L (ref 135–145)
Total Bilirubin: 0.5 mg/dL (ref 0.3–1.2)
Total Protein: 6.6 g/dL (ref 6.5–8.1)

## 2022-06-22 LAB — CBC WITH DIFFERENTIAL (CANCER CENTER ONLY)
Abs Immature Granulocytes: 0.03 10*3/uL (ref 0.00–0.07)
Basophils Absolute: 0.1 10*3/uL (ref 0.0–0.1)
Basophils Relative: 1 %
Eosinophils Absolute: 0.2 10*3/uL (ref 0.0–0.5)
Eosinophils Relative: 5 %
HCT: 33.3 % — ABNORMAL LOW (ref 39.0–52.0)
Hemoglobin: 11.3 g/dL — ABNORMAL LOW (ref 13.0–17.0)
Immature Granulocytes: 1 %
Lymphocytes Relative: 27 %
Lymphs Abs: 0.9 10*3/uL (ref 0.7–4.0)
MCH: 26.8 pg (ref 26.0–34.0)
MCHC: 33.9 g/dL (ref 30.0–36.0)
MCV: 79.1 fL — ABNORMAL LOW (ref 80.0–100.0)
Monocytes Absolute: 1.1 10*3/uL — ABNORMAL HIGH (ref 0.1–1.0)
Monocytes Relative: 31 %
Neutro Abs: 1.2 10*3/uL — ABNORMAL LOW (ref 1.7–7.7)
Neutrophils Relative %: 35 %
Platelet Count: 171 10*3/uL (ref 150–400)
RBC: 4.21 MIL/uL — ABNORMAL LOW (ref 4.22–5.81)
RDW: 14.6 % (ref 11.5–15.5)
WBC Count: 3.5 10*3/uL — ABNORMAL LOW (ref 4.0–10.5)
nRBC: 0 % (ref 0.0–0.2)

## 2022-06-22 LAB — MAGNESIUM: Magnesium: 1.6 mg/dL — ABNORMAL LOW (ref 1.7–2.4)

## 2022-06-22 MED ORDER — SODIUM CHLORIDE 0.9 % IV SOLN
Freq: Once | INTRAVENOUS | Status: AC
  Filled 2022-06-22: qty 250

## 2022-06-22 MED ORDER — MAGNESIUM SULFATE 2 GM/50ML IV SOLN
2.0000 g | Freq: Once | INTRAVENOUS | Status: AC
  Administered 2022-06-22: 2 g via INTRAVENOUS
  Filled 2022-06-22: qty 50

## 2022-06-25 ENCOUNTER — Other Ambulatory Visit: Payer: Self-pay

## 2022-06-29 ENCOUNTER — Inpatient Hospital Stay: Payer: Commercial Managed Care - PPO

## 2022-06-29 ENCOUNTER — Ambulatory Visit: Payer: Commercial Managed Care - PPO

## 2022-06-29 DIAGNOSIS — C9000 Multiple myeloma not having achieved remission: Secondary | ICD-10-CM | POA: Diagnosis not present

## 2022-06-29 LAB — CBC WITH DIFFERENTIAL (CANCER CENTER ONLY)
Abs Immature Granulocytes: 0.01 10*3/uL (ref 0.00–0.07)
Basophils Absolute: 0 10*3/uL (ref 0.0–0.1)
Basophils Relative: 1 %
Eosinophils Absolute: 0.3 10*3/uL (ref 0.0–0.5)
Eosinophils Relative: 8 %
HCT: 33.2 % — ABNORMAL LOW (ref 39.0–52.0)
Hemoglobin: 11.3 g/dL — ABNORMAL LOW (ref 13.0–17.0)
Immature Granulocytes: 0 %
Lymphocytes Relative: 26 %
Lymphs Abs: 1.1 10*3/uL (ref 0.7–4.0)
MCH: 26.8 pg (ref 26.0–34.0)
MCHC: 34 g/dL (ref 30.0–36.0)
MCV: 78.9 fL — ABNORMAL LOW (ref 80.0–100.0)
Monocytes Absolute: 0.7 10*3/uL (ref 0.1–1.0)
Monocytes Relative: 17 %
Neutro Abs: 2 10*3/uL (ref 1.7–7.7)
Neutrophils Relative %: 48 %
Platelet Count: 181 10*3/uL (ref 150–400)
RBC: 4.21 MIL/uL — ABNORMAL LOW (ref 4.22–5.81)
RDW: 14.7 % (ref 11.5–15.5)
WBC Count: 4 10*3/uL (ref 4.0–10.5)
nRBC: 0 % (ref 0.0–0.2)

## 2022-06-29 LAB — CMP (CANCER CENTER ONLY)
ALT: 21 U/L (ref 0–44)
AST: 19 U/L (ref 15–41)
Albumin: 4.2 g/dL (ref 3.5–5.0)
Alkaline Phosphatase: 61 U/L (ref 38–126)
Anion gap: 8 (ref 5–15)
BUN: 6 mg/dL (ref 6–20)
CO2: 26 mmol/L (ref 22–32)
Calcium: 9.4 mg/dL (ref 8.9–10.3)
Chloride: 104 mmol/L (ref 98–111)
Creatinine: 0.86 mg/dL (ref 0.61–1.24)
GFR, Estimated: >60 mL/min (ref >60)
Glucose, Bld: 96 mg/dL (ref 70–99)
Potassium: 3.4 mmol/L — ABNORMAL LOW (ref 3.5–5.1)
Sodium: 138 mmol/L (ref 135–145)
Total Bilirubin: 0.6 mg/dL (ref 0.3–1.2)
Total Protein: 6.6 g/dL (ref 6.5–8.1)

## 2022-06-29 LAB — MAGNESIUM: Magnesium: 1.7 mg/dL (ref 1.7–2.4)

## 2022-07-06 ENCOUNTER — Inpatient Hospital Stay: Payer: Commercial Managed Care - PPO

## 2022-07-06 ENCOUNTER — Inpatient Hospital Stay: Payer: Commercial Managed Care - PPO | Attending: Oncology

## 2022-07-06 ENCOUNTER — Ambulatory Visit: Payer: Commercial Managed Care - PPO

## 2022-07-06 DIAGNOSIS — C9000 Multiple myeloma not having achieved remission: Secondary | ICD-10-CM | POA: Insufficient documentation

## 2022-07-06 LAB — CBC WITH DIFFERENTIAL (CANCER CENTER ONLY)
Abs Immature Granulocytes: 0.02 10*3/uL (ref 0.00–0.07)
Basophils Absolute: 0 10*3/uL (ref 0.0–0.1)
Basophils Relative: 0 %
Eosinophils Absolute: 0.2 10*3/uL (ref 0.0–0.5)
Eosinophils Relative: 5 %
HCT: 29.5 % — ABNORMAL LOW (ref 39.0–52.0)
Hemoglobin: 10.1 g/dL — ABNORMAL LOW (ref 13.0–17.0)
Immature Granulocytes: 1 %
Lymphocytes Relative: 19 %
Lymphs Abs: 0.6 10*3/uL — ABNORMAL LOW (ref 0.7–4.0)
MCH: 27.2 pg (ref 26.0–34.0)
MCHC: 34.2 g/dL (ref 30.0–36.0)
MCV: 79.5 fL — ABNORMAL LOW (ref 80.0–100.0)
Monocytes Absolute: 0.5 10*3/uL (ref 0.1–1.0)
Monocytes Relative: 15 %
Neutro Abs: 1.9 10*3/uL (ref 1.7–7.7)
Neutrophils Relative %: 60 %
Platelet Count: 204 10*3/uL (ref 150–400)
RBC: 3.71 MIL/uL — ABNORMAL LOW (ref 4.22–5.81)
RDW: 14.7 % (ref 11.5–15.5)
WBC Count: 3.2 10*3/uL — ABNORMAL LOW (ref 4.0–10.5)
nRBC: 0 % (ref 0.0–0.2)

## 2022-07-06 LAB — CMP (CANCER CENTER ONLY)
ALT: 18 U/L (ref 0–44)
AST: 17 U/L (ref 15–41)
Albumin: 4 g/dL (ref 3.5–5.0)
Alkaline Phosphatase: 59 U/L (ref 38–126)
Anion gap: 6 (ref 5–15)
BUN: 6 mg/dL (ref 6–20)
CO2: 25 mmol/L (ref 22–32)
Calcium: 9.1 mg/dL (ref 8.9–10.3)
Chloride: 106 mmol/L (ref 98–111)
Creatinine: 0.89 mg/dL (ref 0.61–1.24)
GFR, Estimated: >60 mL/min (ref >60)
Glucose, Bld: 121 mg/dL — ABNORMAL HIGH (ref 70–99)
Potassium: 3.5 mmol/L (ref 3.5–5.1)
Sodium: 137 mmol/L (ref 135–145)
Total Bilirubin: 0.8 mg/dL (ref 0.3–1.2)
Total Protein: 6.9 g/dL (ref 6.5–8.1)

## 2022-07-06 LAB — MAGNESIUM: Magnesium: 1.8 mg/dL (ref 1.7–2.4)

## 2022-07-13 ENCOUNTER — Other Ambulatory Visit: Payer: Self-pay | Admitting: *Deleted

## 2022-07-13 DIAGNOSIS — C9000 Multiple myeloma not having achieved remission: Secondary | ICD-10-CM

## 2022-07-14 ENCOUNTER — Inpatient Hospital Stay (HOSPITAL_BASED_OUTPATIENT_CLINIC_OR_DEPARTMENT_OTHER): Payer: Commercial Managed Care - PPO | Admitting: Oncology

## 2022-07-14 ENCOUNTER — Encounter: Payer: Self-pay | Admitting: Oncology

## 2022-07-14 ENCOUNTER — Inpatient Hospital Stay: Payer: Commercial Managed Care - PPO

## 2022-07-14 VITALS — BP 99/68 | HR 105 | Temp 98.6°F | Resp 18 | Wt 208.9 lb

## 2022-07-14 DIAGNOSIS — C7951 Secondary malignant neoplasm of bone: Secondary | ICD-10-CM | POA: Diagnosis not present

## 2022-07-14 DIAGNOSIS — E876 Hypokalemia: Secondary | ICD-10-CM | POA: Diagnosis not present

## 2022-07-14 DIAGNOSIS — C9000 Multiple myeloma not having achieved remission: Secondary | ICD-10-CM

## 2022-07-14 LAB — CMP (CANCER CENTER ONLY)
ALT: 16 U/L (ref 0–44)
AST: 18 U/L (ref 15–41)
Albumin: 4.1 g/dL (ref 3.5–5.0)
Alkaline Phosphatase: 64 U/L (ref 38–126)
Anion gap: 8 (ref 5–15)
BUN: 7 mg/dL (ref 6–20)
CO2: 26 mmol/L (ref 22–32)
Calcium: 9 mg/dL (ref 8.9–10.3)
Chloride: 104 mmol/L (ref 98–111)
Creatinine: 0.83 mg/dL (ref 0.61–1.24)
GFR, Estimated: >60 mL/min (ref >60)
Glucose, Bld: 112 mg/dL — ABNORMAL HIGH (ref 70–99)
Potassium: 3.3 mmol/L — ABNORMAL LOW (ref 3.5–5.1)
Sodium: 138 mmol/L (ref 135–145)
Total Bilirubin: 0.8 mg/dL (ref 0.3–1.2)
Total Protein: 6.7 g/dL (ref 6.5–8.1)

## 2022-07-14 LAB — CBC WITH DIFFERENTIAL (CANCER CENTER ONLY)
Abs Immature Granulocytes: 0.01 10*3/uL (ref 0.00–0.07)
Basophils Absolute: 0 10*3/uL (ref 0.0–0.1)
Basophils Relative: 1 %
Eosinophils Absolute: 0.2 10*3/uL (ref 0.0–0.5)
Eosinophils Relative: 6 %
HCT: 32.5 % — ABNORMAL LOW (ref 39.0–52.0)
Hemoglobin: 11 g/dL — ABNORMAL LOW (ref 13.0–17.0)
Immature Granulocytes: 0 %
Lymphocytes Relative: 36 %
Lymphs Abs: 1.2 10*3/uL (ref 0.7–4.0)
MCH: 27 pg (ref 26.0–34.0)
MCHC: 33.8 g/dL (ref 30.0–36.0)
MCV: 79.7 fL — ABNORMAL LOW (ref 80.0–100.0)
Monocytes Absolute: 0.5 10*3/uL (ref 0.1–1.0)
Monocytes Relative: 13 %
Neutro Abs: 1.5 10*3/uL — ABNORMAL LOW (ref 1.7–7.7)
Neutrophils Relative %: 44 %
Platelet Count: 234 10*3/uL (ref 150–400)
RBC: 4.08 MIL/uL — ABNORMAL LOW (ref 4.22–5.81)
RDW: 15.2 % (ref 11.5–15.5)
WBC Count: 3.4 10*3/uL — ABNORMAL LOW (ref 4.0–10.5)
nRBC: 0 % (ref 0.0–0.2)

## 2022-07-14 LAB — MAGNESIUM: Magnesium: 1.6 mg/dL — ABNORMAL LOW (ref 1.7–2.4)

## 2022-07-14 MED ORDER — MAGNESIUM SULFATE 2 GM/50ML IV SOLN
2.0000 g | Freq: Once | INTRAVENOUS | Status: AC
  Administered 2022-07-14: 2 g via INTRAVENOUS

## 2022-07-14 MED ORDER — POTASSIUM CHLORIDE CRYS ER 10 MEQ PO TBCR: 10.0000 meq | EXTENDED_RELEASE_TABLET | Freq: Every day | ORAL | 1 refills | Status: DC

## 2022-07-14 MED ORDER — MAGNESIUM CHLORIDE 64 MG PO TBEC: 1.0000 | DELAYED_RELEASE_TABLET | Freq: Every day | ORAL | 0 refills | Status: DC

## 2022-07-14 MED ORDER — SODIUM CHLORIDE 0.9 % IV SOLN
INTRAVENOUS | Status: DC
  Filled 2022-07-14: qty 250

## 2022-07-14 NOTE — Assessment & Plan Note (Addendum)
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 Labs reviewed and discussed with him.   Continue follow-up with [facility] for posttransplant bone marrow biopsy, and decision of maintenance regimen.  His blood counts are stable

## 2022-07-14 NOTE — Progress Notes (Signed)
Hematology/Oncology Progress note Telephone:(336) [phone] Fax:(336) [phone]     CHIEF COMPLAINTS/PURPOSE OF CONSULTATION:  Multiple myeloma  ASSESSMENT & PLAN:   Cancer Staging  Multiple myeloma not having achieved remission (HCC) Staging form: Plasma Cell Myeloma and Plasma Cell Disorders, AJCC 8th Edition - Clinical stage from 08/20/2021: RISS Stage II (Beta-2-microglobulin (mg/L): 3.6, Albumin (g/dL): 2.2, ISS: Stage II, High-risk cytogenetics: Absent, LDH: Normal) - Signed by Rickard Patience, MD on 10/14/2021   Multiple myeloma not having achieved remission (HCC) # IgG lamda multiple myeloma, 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 Labs reviewed and discussed with him.   Continue follow-up with [facility] for posttransplant bone marrow biopsy, and decision of maintenance regimen.  His blood counts are stable  Hypokalemia Recommend potassium supplementation.   Hypomagnesemia Patient will get IV magnesium 2 g today. Recommend slow mag 1 tab daily.   Metastatic cancer to bone [facility]) Plan to start bisphosphonate in the near future. He will also repeat PET at [facility] I ask him to obtain dental clearance .     Follow up   Follow-up in 4 weeks All questions were answered. The patient knows to call the clinic with any problems, questions or concerns. No barriers to learning was detected.  Rickard Patience, MD 07/14/2022    HISTORY OF PRESENTING ILLNESS:  Tanner [name] 37 y.o. male presents  for follow up of Multiple myeloma I have reviewed his chart and materials related to his cancer extensively and collaborated history with the patient. Summary of oncologic history is as follows: Oncology History  Multiple myeloma not having achieved remission (HCC)  08/14/2021 Imaging   CT chest abdomen pelvis without contrast Showed numerous lucencies throughout the bone structures, most pronounced throughout the spine and left  left iliac bone.  Otherwise no acute findings in the chest abdomen or pelvis.  Sigmoid diverticulosis.   08/19/2021 Initial Diagnosis   Multiple myeloma not having achieved remission, stage II  -08/15/2021, bone marrow biopsy showed plasma cell myeloma involving greater than 80% of the marrow. Cytogenetics Myeloma  FISH   08/20/2021 Cancer Staging   Staging form: Plasma Cell Myeloma and Plasma Cell Disorders, AJCC 8th Edition - Clinical stage from 08/20/2021: RISS Stage II (Beta-2-microglobulin (mg/L): 3.6, Albumin (g/dL): 2.2, ISS: Stage II, High-risk cytogenetics: Absent, LDH: Normal) - Signed by Rickard Patience, MD on 10/14/2021 Beta 2 microglobulin range (mg/L): 3.5 to 5.49 Albumin range (g/dL): Less than 3.5 Cytogenetics: No abnormalities   08/20/2021 -  Chemotherapy   MYELOMA NEWLY DIAGNOSED TRANSPLANT CANDIDATE DaraVRd (Daratumumab SUBQ) q21d x 6 Cycles (Induction/Consolidation)      09/11/2021 Imaging   PET scan showed  1. Extensive multifocal hypermetabolic disease throughout the axial and appendicular skeleton, with numerous lucent osseous lesions,consistent with patient's known diagnosis of multiple myeloma. 2. No tracer avid lymph node or mass identified to suggest soft tissue plasmacytoma   12/18/2021 Imaging   MRI lumbar 1. Multiple scattered enhancing marrow replacing bone lesions within the imaged spine and pelvis consistent with known history of multiple myeloma. No pathologic fracture or extraosseous soft tissue component. 2. Unchanged mild multilevel degenerative changes of the lumbar spine resulting in mild canal stenosis at L2-L3 and L3-L4.  3. Left greater than right subarticular recess stenosis and mild left foraminal stenosis at L5-S1.      05/22/2022 Bone Marrow Transplant   Patient underwent autologous stem cell transplant. 05/30/2022, neutropenic fever.   06/03/2022 engraftment 06/04/2022, G-CSF stopped.  INTERVAL HISTORY Tanner [name] is a 37 y.o.  male who has above history reviewed by me today presents for follow up visit for management of multiple myeloma Patient is status post autologous stem cell transplant.   He is doing well. He denies fever, chills, nausea vomiting diarrhea. He is not taking potassium supplements.   MEDICAL HISTORY:  Past Medical History:  Diagnosis Date   Alcohol use    Sciatica    Smoking     SURGICAL HISTORY: Past Surgical History:  Procedure Laterality Date   INGUINAL HERNIA REPAIR      SOCIAL HISTORY: Social History   Socioeconomic History   Marital status: Single    Spouse name: Not on file   Number of children: Not on file   Years of education: Not on file   Highest education level: Not on file  Occupational History   Not on file  Tobacco Use   Smoking status: Former    Packs/day: 0.50    Years: 10.00    Additional pack years: 0.00    Total pack years: 5.00    Types: E-cigarettes, Cigarettes    Quit date: 08/11/2021    Years since quitting: 0.9   Smokeless tobacco: Never  Vaping Use   Vaping Use: Every day  Substance and Sexual Activity   Alcohol use: Not Currently    Alcohol/week: 24.0 standard drinks of alcohol    Types: 24 Cans of beer per week    Comment: quit drinking approx 08/11/2021   Drug use: Never   Sexual activity: Not on file  Other Topics Concern   Not on file  Social History Narrative   Not on file   Social Determinants of Health   Financial Resource Strain: Not on file  Food Insecurity: Not on file  Transportation Needs: Not on file  Physical Activity: Not on file  Stress: Not on file  Social Connections: Not on file  Intimate Partner Violence: Not on file    FAMILY HISTORY: Family History  Problem Relation Age of Onset   Ovarian cancer Mother    Asthma Mother    Alcoholism Father    Throat cancer Maternal Uncle    Heart disease Maternal Uncle    Lung cancer Maternal Uncle    Lymphoma Maternal Uncle    Lung cancer Paternal Grandmother      ALLERGIES:  has No Known Allergies.  MEDICATIONS:  Current Outpatient Medications  Medication Sig Dispense Refill   acyclovir (ZOVIRAX) 400 MG tablet Take 1 tablet (400 mg total) by mouth 2 (two) times daily. 60 tablet 5   magnesium chloride (SLOW-MAG) 64 MG TBEC SR tablet Take 1 tablet (64 mg total) by mouth daily. 30 tablet 0   ondansetron (ZOFRAN) 8 MG tablet Take 1 tablet (8 mg total) by mouth 2 (two) times daily as needed for nausea or vomiting. 20 tablet 2   pantoprazole (PROTONIX) 40 MG tablet Take by mouth.     potassium chloride (KLOR-CON M) 10 MEQ tablet Take 1 tablet (10 mEq total) by mouth daily. 30 tablet 1   allopurinol (ZYLOPRIM) 100 MG tablet Take 0.5 tablets (50 mg total) by mouth daily. (Patient not taking: Reported on 06/15/2022) 30 tablet 2   aspirin EC 81 MG tablet Take 81 mg by mouth daily. Swallow whole. (Patient not taking: Reported on 06/15/2022)     dexamethasone (DECADRON) 4 MG tablet Take 5 tablets (20 mg total) by mouth See admin instructions. take dexamethasone 20 mg, on day 2,  9,15. (Patient not taking: Reported on 06/15/2022) 60 tablet 0   KLOR-CON M20 20 MEQ tablet TAKE 1 TABLET BY MOUTH EVERY DAY (Patient not taking: Reported on 06/15/2022) 30 tablet 0   lenalidomide (REVLIMID) 25 MG capsule Take 1 capsule (25 mg total) by mouth daily. Take for 14 days, then hold for 7 days. Repeat every 21 days. (Patient not taking: Reported on 06/15/2022) 14 capsule 0   OLANZapine (ZYPREXA) 5 MG tablet Take 1 tablet (5 mg total) by mouth at bedtime. (Patient not taking: Reported on 06/15/2022) 30 tablet 1   senna-docusate (SENOKOT-S) 8.6-50 MG tablet Take 1 tablet by mouth daily. (Patient not taking: Reported on 06/15/2022) 60 tablet 2   traMADol (ULTRAM) 50 MG tablet Take 1 tablet (50 mg total) by mouth every 6 (six) hours as needed. (Patient not taking: Reported on 06/15/2022) 30 tablet 0   No current facility-administered medications for this visit.    Review of Systems   Constitutional:  Positive for appetite change and fatigue. Negative for chills, fever and unexpected weight change.  HENT:   Negative for hearing loss and voice change.   Eyes:  Negative for eye problems and icterus.  Respiratory:  Negative for chest tightness, cough and shortness of breath.   Cardiovascular:  Negative for chest pain and leg swelling.  Gastrointestinal:  Negative for abdominal distention and abdominal pain.  Endocrine: Negative for hot flashes.  Genitourinary:  Negative for difficulty urinating, dysuria and frequency.   Musculoskeletal:  Positive for back pain. Negative for arthralgias.  Skin:  Negative for itching and rash.  Neurological:  Negative for light-headedness and numbness.  Hematological:  Negative for adenopathy. Does not bruise/bleed easily.  Psychiatric/Behavioral:  Negative for confusion.      PHYSICAL EXAMINATION: ECOG PERFORMANCE STATUS: 1 - Symptomatic but completely ambulatory  Vitals:   07/14/22 1305  BP: 99/68  Pulse: (!) 105  Resp: 18  Temp: 98.6 F (37 C)  SpO2: 100%   Filed Weights   07/14/22 1305  Weight: 208 lb 14.4 oz (94.8 kg)    Physical Exam Constitutional:      General: He is not in acute distress.    Appearance: He is obese. He is not diaphoretic.  HENT:     Head: Normocephalic and atraumatic.     Mouth/Throat:     Pharynx: No oropharyngeal exudate.  Eyes:     General: No scleral icterus. Cardiovascular:     Rate and Rhythm: Normal rate.     Heart sounds: No murmur heard. Pulmonary:     Effort: Pulmonary effort is normal. No respiratory distress.  Abdominal:     General: There is no distension.     Palpations: Abdomen is soft.  Musculoskeletal:        General: Normal range of motion.     Cervical back: Normal range of motion.  Skin:    General: Skin is warm and dry.     Findings: No erythema.  Neurological:     Mental Status: He is alert and oriented to person, place, and time. Mental status is at baseline.      Cranial Nerves: No cranial nerve deficit.     Motor: No abnormal muscle tone.  Psychiatric:        Mood and Affect: Mood and affect normal.      LABORATORY DATA:  I have reviewed the data as listed    Latest Ref Rng & Units 07/14/2022   12:41 PM 07/06/2022    8:41 AM  06/29/2022    8:40 AM  CBC  WBC 4.0 - 10.5 K/uL 3.4  3.2  4.0   Hemoglobin 13.0 - 17.0 g/dL 54.0  98.1  19.1   Hematocrit 39.0 - 52.0 % 32.5  29.5  33.2   Platelets 150 - 400 K/uL 234  204  181       Latest Ref Rng & Units 07/14/2022   12:41 PM 07/06/2022    8:41 AM 06/29/2022    8:40 AM  CMP  Glucose 70 - 99 mg/dL 478  295  96   BUN 6 - 20 mg/dL 7  6  6    Creatinine 0.61 - 1.24 mg/dL 6.21  3.08  6.57   Sodium 135 - 145 mmol/L 138  137  138   Potassium 3.5 - 5.1 mmol/L 3.3  3.5  3.4   Chloride 98 - 111 mmol/L 104  106  104   CO2 22 - 32 mmol/L 26  25  26    Calcium 8.9 - 10.3 mg/dL 9.0  9.1  9.4   Total Protein 6.5 - 8.1 g/dL 6.7  6.9  6.6   Total Bilirubin 0.3 - 1.2 mg/dL 0.8  0.8  0.6   Alkaline Phos 38 - 126 U/L 64  59  61   AST 15 - 41 U/L 18  17  19    ALT 0 - 44 U/L 16  18  21         RADIOGRAPHIC STUDIES: I have personally reviewed the radiological images as listed and agreed with the findings in the report. No results found.

## 2022-07-14 NOTE — Assessment & Plan Note (Addendum)
Plan to start bisphosphonate in the near future. He will also repeat PET at [facility] I ask him to obtain dental clearance .

## 2022-07-14 NOTE — Assessment & Plan Note (Signed)
Recommend potassium supplementation.

## 2022-07-14 NOTE — Assessment & Plan Note (Signed)
Patient will get IV magnesium 2 g today. Recommend slow mag 1 tab daily.

## 2022-07-15 ENCOUNTER — Other Ambulatory Visit: Payer: Self-pay

## 2022-07-21 ENCOUNTER — Other Ambulatory Visit: Payer: Self-pay | Admitting: Oncology

## 2022-07-21 ENCOUNTER — Telehealth: Payer: Self-pay | Admitting: *Deleted

## 2022-07-21 DIAGNOSIS — C9000 Multiple myeloma not having achieved remission: Secondary | ICD-10-CM

## 2022-07-21 NOTE — Telephone Encounter (Signed)
Call from Cristie Hem with Dr Bosie Clos office requesting that we draw MM labs on this patient early in the week of 08/10/22 before he goes back to see Dr Pandora Leiter on the 13th. She said if you need anything form her to please call her [phone]

## 2022-07-21 NOTE — Telephone Encounter (Signed)
Dr Yu patient?

## 2022-08-04 ENCOUNTER — Other Ambulatory Visit: Payer: Self-pay

## 2022-08-11 ENCOUNTER — Inpatient Hospital Stay: Payer: Commercial Managed Care - PPO | Attending: Oncology

## 2022-08-11 ENCOUNTER — Inpatient Hospital Stay (HOSPITAL_BASED_OUTPATIENT_CLINIC_OR_DEPARTMENT_OTHER): Payer: Commercial Managed Care - PPO | Admitting: Oncology

## 2022-08-11 ENCOUNTER — Inpatient Hospital Stay: Payer: Commercial Managed Care - PPO

## 2022-08-11 ENCOUNTER — Other Ambulatory Visit: Payer: Self-pay

## 2022-08-11 ENCOUNTER — Encounter: Payer: Self-pay | Admitting: Oncology

## 2022-08-11 VITALS — BP 105/79 | HR 95 | Temp 97.9°F | Resp 18 | Wt 212.9 lb

## 2022-08-11 DIAGNOSIS — C7951 Secondary malignant neoplasm of bone: Secondary | ICD-10-CM

## 2022-08-11 DIAGNOSIS — C9000 Multiple myeloma not having achieved remission: Secondary | ICD-10-CM | POA: Diagnosis not present

## 2022-08-11 DIAGNOSIS — E876 Hypokalemia: Secondary | ICD-10-CM

## 2022-08-11 LAB — CBC WITH DIFFERENTIAL (CANCER CENTER ONLY)
Abs Immature Granulocytes: 0.01 10*3/uL (ref 0.00–0.07)
Basophils Absolute: 0 10*3/uL (ref 0.0–0.1)
Basophils Relative: 0 %
Eosinophils Absolute: 0 10*3/uL (ref 0.0–0.5)
Eosinophils Relative: 1 %
HCT: 32.2 % — ABNORMAL LOW (ref 39.0–52.0)
Hemoglobin: 10.7 g/dL — ABNORMAL LOW (ref 13.0–17.0)
Immature Granulocytes: 0 %
Lymphocytes Relative: 24 %
Lymphs Abs: 0.5 10*3/uL — ABNORMAL LOW (ref 0.7–4.0)
MCH: 27.8 pg (ref 26.0–34.0)
MCHC: 33.2 g/dL (ref 30.0–36.0)
MCV: 83.6 fL (ref 80.0–100.0)
Monocytes Absolute: 0.3 10*3/uL (ref 0.1–1.0)
Monocytes Relative: 13 %
Neutro Abs: 1.4 10*3/uL — ABNORMAL LOW (ref 1.7–7.7)
Neutrophils Relative %: 62 %
Platelet Count: 194 10*3/uL (ref 150–400)
RBC: 3.85 MIL/uL — ABNORMAL LOW (ref 4.22–5.81)
RDW: 15.9 % — ABNORMAL HIGH (ref 11.5–15.5)
WBC Count: 2.2 10*3/uL — ABNORMAL LOW (ref 4.0–10.5)
nRBC: 0 % (ref 0.0–0.2)

## 2022-08-11 LAB — CMP (CANCER CENTER ONLY)
ALT: 14 U/L (ref 0–44)
AST: 14 U/L — ABNORMAL LOW (ref 15–41)
Albumin: 4.2 g/dL (ref 3.5–5.0)
Alkaline Phosphatase: 59 U/L (ref 38–126)
Anion gap: 8 (ref 5–15)
BUN: 7 mg/dL (ref 6–20)
CO2: 21 mmol/L — ABNORMAL LOW (ref 22–32)
Calcium: 8.8 mg/dL — ABNORMAL LOW (ref 8.9–10.3)
Chloride: 107 mmol/L (ref 98–111)
Creatinine: 0.82 mg/dL (ref 0.61–1.24)
GFR, Estimated: >60 mL/min (ref >60)
Glucose, Bld: 97 mg/dL (ref 70–99)
Potassium: 3.1 mmol/L — ABNORMAL LOW (ref 3.5–5.1)
Sodium: 136 mmol/L (ref 135–145)
Total Bilirubin: 1 mg/dL (ref 0.3–1.2)
Total Protein: 6.5 g/dL (ref 6.5–8.1)

## 2022-08-11 LAB — MAGNESIUM: Magnesium: 1.8 mg/dL (ref 1.7–2.4)

## 2022-08-11 LAB — LACTATE DEHYDROGENASE: LDH: 80 U/L — ABNORMAL LOW (ref 98–192)

## 2022-08-11 MED ORDER — SODIUM CHLORIDE 0.9 % IV SOLN
Freq: Once | INTRAVENOUS | Status: AC
  Filled 2022-08-11: qty 250

## 2022-08-11 MED ORDER — POTASSIUM CHLORIDE CRYS ER 20 MEQ PO TBCR: 20.0000 meq | EXTENDED_RELEASE_TABLET | Freq: Every day | ORAL | 0 refills | Status: DC

## 2022-08-11 MED ORDER — SODIUM CHLORIDE 0.9 % IV SOLN
Freq: Once | INTRAVENOUS | Status: DC
Start: 2022-08-11 — End: 2022-08-11

## 2022-08-11 MED ORDER — POTASSIUM CHLORIDE 10 MEQ/100ML IV SOLN
10.0000 meq | Freq: Once | INTRAVENOUS | Status: AC
  Administered 2022-08-11: 10 meq via INTRAVENOUS
  Filled 2022-08-11: qty 100

## 2022-08-11 NOTE — Assessment & Plan Note (Addendum)
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 Labs reviewed and discussed with him.   Stable counts. Bone marrow biopsy done at [facility] showed no plasma cell.  Cytogenetics, molecular studies are pending. PET scan is pending Continue follow-up with [facility]ology for posttransplant recommendation of maintenance regimen.

## 2022-08-11 NOTE — Assessment & Plan Note (Signed)
Plan to start bisphosphonate in the near future. He will also repeat PET at [facility] I ask him to obtain dental clearance .

## 2022-08-11 NOTE — Progress Notes (Signed)
Hematology/Oncology Progress note Telephone:([phone]) [phone] [phone]     CHIEF COMPLAINTS/PURPOSE OF CONSULTATION:  Multiple myeloma  ASSESSMENT & PLAN:   Cancer Staging  Multiple myeloma not having achieved remission (HCC) Staging form: Plasma Cell Myeloma and Plasma Cell Disorders, AJCC 8th Edition - Clinical stage from 08/20/2021: RISS Stage II (Beta-2-microglobulin (mg/L): 3.6, Albumin (g/dL): 2.2, ISS: Stage II, High-risk cytogenetics: Absent, LDH: Normal) - Signed by Rickard Patience, MD on 10/14/2021   Multiple myeloma not having achieved remission (HCC) # IgG lamda multiple myeloma, 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 Labs reviewed and discussed with him.   Stable counts. Bone marrow biopsy done at [facility] showed no plasma cell.  Cytogenetics, molecular studies are pending. PET scan is pending Continue follow-up with [facility] for posttransplant recommendation of maintenance regimen.    Hypokalemia Recommend potassium supplementation.  Patient will get IV KCl 10 meq x 1 today  Hypomagnesemia Normal magnesium. Recommend slow mag 1 tab daily.   Metastatic cancer to bone [facility]) Plan to start bisphosphonate in the near future. He will also repeat PET at [facility] I ask him to obtain dental clearance .     Follow up   Follow-up in 4 weeks All questions were answered. The patient knows to call the clinic with any problems, questions or concerns. No barriers to learning was detected.  Rickard Patience, MD 08/11/2022    HISTORY OF PRESENTING ILLNESS:  Tanner [name] 37 y.o. male presents  for follow up of Multiple myeloma I have reviewed his chart and materials related to his cancer extensively and collaborated history with the patient. Summary of oncologic history is as follows: Oncology History  Multiple myeloma not having achieved remission (HCC)  08/14/2021 Imaging   CT chest abdomen pelvis without  contrast Showed numerous lucencies throughout the bone structures, most pronounced throughout the spine and left left iliac bone.  Otherwise no acute findings in the chest abdomen or pelvis.  Sigmoid diverticulosis.   08/19/2021 Initial Diagnosis   Multiple myeloma not having achieved remission, stage II  -08/15/2021, bone marrow biopsy showed plasma cell myeloma involving greater than 80% of the marrow. Cytogenetics Myeloma  FISH   08/20/2021 Cancer Staging   Staging form: Plasma Cell Myeloma and Plasma Cell Disorders, AJCC 8th Edition - Clinical stage from 08/20/2021: RISS Stage II (Beta-2-microglobulin (mg/L): 3.6, Albumin (g/dL): 2.2, ISS: Stage II, High-risk cytogenetics: Absent, LDH: Normal) - Signed by Rickard Patience, MD on 10/14/2021 Beta 2 microglobulin range (mg/L): 3.5 to 5.49 Albumin range (g/dL): Less than 3.5 Cytogenetics: No abnormalities   08/20/2021 -  Chemotherapy   MYELOMA NEWLY DIAGNOSED TRANSPLANT CANDIDATE DaraVRd (Daratumumab SUBQ) q21d x 6 Cycles (Induction/Consolidation)      09/11/2021 Imaging   PET scan showed  1. Extensive multifocal hypermetabolic disease throughout the axial and appendicular skeleton, with numerous lucent osseous lesions,consistent with patient's known diagnosis of multiple myeloma. 2. No tracer avid lymph node or mass identified to suggest soft tissue plasmacytoma   12/18/2021 Imaging   MRI lumbar 1. Multiple scattered enhancing marrow replacing bone lesions within the imaged spine and pelvis consistent with known history of multiple myeloma. No pathologic fracture or extraosseous soft tissue component. 2. Unchanged mild multilevel degenerative changes of the lumbar spine resulting in mild canal stenosis at L2-L3 and L3-L4.  3. Left greater than right subarticular recess stenosis and mild left foraminal stenosis at L5-S1.      05/22/2022 Bone Marrow Transplant   Patient underwent autologous  stem cell transplant. 05/30/2022, neutropenic fever.    06/03/2022 engraftment 06/04/2022, G-CSF stopped.      INTERVAL HISTORY Tanner [name] is a 37 y.o. male who has above history reviewed by me today presents for follow up visit for management of multiple myeloma Patient is status post autologous stem cell transplant.   He is doing well. He denies fever, chills, nausea vomiting diarrhea.  MEDICAL HISTORY:  Past Medical History:  Diagnosis Date   Alcohol use    Sciatica    Smoking     SURGICAL HISTORY: Past Surgical History:  Procedure Laterality Date   INGUINAL HERNIA REPAIR      SOCIAL HISTORY: Social History   Socioeconomic History   Marital status: Single    Spouse name: Not on file   Number of children: Not on file   Years of education: Not on file   Highest education level: Not on file  Occupational History   Not on file  Tobacco Use   Smoking status: Former    Current packs/day: 0.00    Average packs/day: 0.5 packs/day for 10.0 years (5.0 ttl pk-yrs)    Types: E-cigarettes, Cigarettes    Start date: 08/12/2011    Quit date: 08/11/2021    Years since quitting: 1.0   Smokeless tobacco: Never  Vaping Use   Vaping status: Every Day  Substance and Sexual Activity   Alcohol use: Not Currently    Alcohol/week: 24.0 standard drinks of alcohol    Types: 24 Cans of beer per week    Comment: quit drinking approx 08/11/2021   Drug use: Never   Sexual activity: Not on file  Other Topics Concern   Not on file  Social History Narrative   Not on file   Social Determinants of Health   Financial Resource Strain: Low Risk  (05/30/2022)   Received from [facility], [facility]   Overall Financial Resource Strain (CARDIA)    Difficulty of Paying Living Expenses: Not hard at all  Food Insecurity: No Food Insecurity (05/30/2022)   Received from [facility], [facility]   Hunger Vital Sign    Worried About Running Out of Food in the Last Year:  Never true    Ran Out of Food in the Last Year: Never true  Transportation Needs: No Transportation Needs (06/01/2022)   Received from [facility], [facility]   [facility] - Transportation    In the past 12 months, has lack of transportation kept you from medical appointments or from getting medications?: No    Lack of Transportation (Non-Medical): No  Physical Activity: Not on file  Stress: Stress Concern Present (01/13/2022)   Received from [facility], [facility]   [employer] [facility] Occupational [facility] - Occupational Stress Questionnaire    Feeling of Stress : To some extent  Social Connections: Not on file  Intimate Partner Violence: Not on file    FAMILY HISTORY: Family History  Problem Relation Age of Onset   Ovarian cancer Mother    Asthma Mother    Alcoholism Father    Throat cancer Maternal Uncle    Heart disease Maternal Uncle    Lung cancer Maternal Uncle    Lymphoma Maternal Uncle    Lung cancer Paternal Grandmother     ALLERGIES:  has No Known Allergies.  MEDICATIONS:  Current Outpatient Medications  Medication Sig Dispense Refill   acyclovir (ZOVIRAX) 400 MG  tablet Take 1 tablet (400 mg total) by mouth 2 (two) times daily. 60 tablet 5   dexamethasone (DECADRON) 4 MG tablet Take 5 tablets (20 mg total) by mouth See admin instructions. take dexamethasone 20 mg, on day 2, 9,15. 60 tablet 0   magnesium chloride (SLOW-MAG) 64 MG TBEC SR tablet Take 1 tablet (64 mg total) by mouth daily. 30 tablet 0   ondansetron (ZOFRAN) 8 MG tablet Take 1 tablet (8 mg total) by mouth 2 (two) times daily as needed for nausea or vomiting. 20 tablet 2   allopurinol (ZYLOPRIM) 100 MG tablet Take 0.5 tablets (50 mg total) by mouth daily. (Patient not taking: Reported on 06/15/2022) 30 tablet 2   aspirin EC 81 MG tablet Take 81 mg by mouth daily. Swallow whole. (Patient not taking: Reported on 06/15/2022)     lenalidomide  (REVLIMID) 25 MG capsule Take 1 capsule (25 mg total) by mouth daily. Take for 14 days, then hold for 7 days. Repeat every 21 days. (Patient not taking: Reported on 06/15/2022) 14 capsule 0   OLANZapine (ZYPREXA) 5 MG tablet Take 1 tablet (5 mg total) by mouth at bedtime. (Patient not taking: Reported on 06/15/2022) 30 tablet 1   pantoprazole (PROTONIX) 40 MG tablet Take by mouth. (Patient not taking: Reported on 08/11/2022)     potassium chloride SA (KLOR-CON M20) 20 MEQ tablet Take 1 tablet (20 mEq total) by mouth daily. 30 tablet 0   senna-docusate (SENOKOT-S) 8.6-50 MG tablet Take 1 tablet by mouth daily. (Patient not taking: Reported on 06/15/2022) 60 tablet 2   traMADol (ULTRAM) 50 MG tablet Take 1 tablet (50 mg total) by mouth every 6 (six) hours as needed. (Patient not taking: Reported on 06/15/2022) 30 tablet 0   No current facility-administered medications for this visit.    Review of Systems  Constitutional:  Positive for fatigue. Negative for appetite change, chills, fever and unexpected weight change.  HENT:   Negative for hearing loss and voice change.   Eyes:  Negative for eye problems and icterus.  Respiratory:  Negative for chest tightness, cough and shortness of breath.   Cardiovascular:  Negative for chest pain and leg swelling.  Gastrointestinal:  Negative for abdominal distention and abdominal pain.  Endocrine: Negative for hot flashes.  Genitourinary:  Negative for difficulty urinating, dysuria and frequency.   Musculoskeletal:  Positive for back pain. Negative for arthralgias.  Skin:  Negative for itching and rash.  Neurological:  Negative for light-headedness and numbness.  Hematological:  Negative for adenopathy. Does not bruise/bleed easily.  Psychiatric/Behavioral:  Negative for confusion.      PHYSICAL EXAMINATION: ECOG PERFORMANCE STATUS: 1 - Symptomatic but completely ambulatory  Vitals:   08/11/22 1301  BP: 105/79  Pulse: 95  Resp: 18  Temp: 97.9 F (36.6  C)  SpO2: 100%   Filed Weights   08/11/22 1301  Weight: 212 lb 14.4 oz (96.6 kg)    Physical Exam Constitutional:      General: He is not in acute distress.    Appearance: He is obese. He is not diaphoretic.  HENT:     Head: Normocephalic and atraumatic.     Mouth/Throat:     Pharynx: No oropharyngeal exudate.  Eyes:     General: No scleral icterus. Cardiovascular:     Rate and Rhythm: Normal rate.     Heart sounds: No murmur heard. Pulmonary:     Effort: Pulmonary effort is normal. No respiratory distress.  Abdominal:  General: There is no distension.     Palpations: Abdomen is soft.  Musculoskeletal:        General: Normal range of motion.     Cervical back: Normal range of motion.  Skin:    General: Skin is warm and dry.     Findings: No erythema.  Neurological:     Mental Status: He is alert and oriented to person, place, and time. Mental status is at baseline.     Cranial Nerves: No cranial nerve deficit.     Motor: No abnormal muscle tone.  Psychiatric:        Mood and Affect: Mood and affect normal.      LABORATORY DATA:  I have reviewed the data as listed    Latest Ref Rng & Units 08/11/2022   12:49 PM 07/14/2022   12:41 PM 07/06/2022    8:41 AM  CBC  WBC 4.0 - 10.5 K/uL 2.2  3.4  3.2   Hemoglobin 13.0 - 17.0 g/dL 16.1  09.6  04.5   Hematocrit 39.0 - 52.0 % 32.2  32.5  29.5   Platelets 150 - 400 K/uL 194  234  204       Latest Ref Rng & Units 08/11/2022   12:49 PM 07/14/2022   12:41 PM 07/06/2022    8:41 AM  CMP  Glucose 70 - 99 mg/dL 97  409  811   BUN 6 - 20 mg/dL 7  7  6    Creatinine 0.61 - 1.24 mg/dL 9.14  7.82  9.56   Sodium 135 - 145 mmol/L 136  138  137   Potassium 3.5 - 5.1 mmol/L 3.1  3.3  3.5   Chloride 98 - 111 mmol/L 107  104  106   CO2 22 - 32 mmol/L 21  26  25    Calcium 8.9 - 10.3 mg/dL 8.8  9.0  9.1   Total Protein 6.5 - 8.1 g/dL 6.5  6.7  6.9   Total Bilirubin 0.3 - 1.2 mg/dL 1.0  0.8  0.8   Alkaline Phos 38 - 126 U/L 59  64  59    AST 15 - 41 U/L 14  18  17    ALT 0 - 44 U/L 14  16  18         RADIOGRAPHIC STUDIES: I have personally reviewed the radiological images as listed and agreed with the findings in the report. No results found.

## 2022-08-11 NOTE — Assessment & Plan Note (Signed)
Normal magnesium. Recommend slow mag 1 tab daily.

## 2022-08-11 NOTE — Patient Instructions (Signed)
Hypokalemia Hypokalemia means that the amount of potassium in the blood is lower than normal. Potassium is a mineral (electrolyte) that helps regulate the amount of fluid in the body. It also stimulates muscle tightening (contraction) and helps nerves work properly. Normally, most of the body's potassium is inside cells, and only a very small amount is in the blood. Because the amount in the blood is so small, minor changes to potassium levels in the blood can be life-threatening. What are the causes? This condition may be caused by: Antibiotic medicine. Diarrhea or vomiting. Taking too much of a medicine that helps you have a bowel movement (laxative) can cause diarrhea and lead to hypokalemia. Chronic kidney disease (CKD). Medicines that help the body get rid of excess fluid (diuretics). Eating disorders, such as anorexia or bulimia. Low magnesium levels in the body. Sweating a lot. What are the signs or symptoms? Symptoms of this condition include: Weakness. Constipation. Fatigue. Muscle cramps. Mental confusion. Skipped heartbeats or irregular heartbeat (palpitations). Tingling or numbness. How is this diagnosed? This condition is diagnosed with a blood test. How is this treated? This condition may be treated by: Taking potassium supplements. Adjusting the medicines that you take. Eating more foods that contain a lot of potassium. If your potassium level is very low, you may need to get potassium through an IV and be monitored in the hospital. Follow these instructions at home: Eating and drinking  Eat a healthy diet. A healthy diet includes fresh fruits and vegetables, whole grains, healthy fats, and lean proteins. If told, eat more foods that contain a lot of potassium. These include: Nuts, such as peanuts and pistachios. Seeds, such as sunflower seeds and pumpkin seeds. Peas, lentils, and lima beans. Whole grain and bran cereals and breads. Fresh fruits and vegetables,  such as apricots, avocado, bananas, cantaloupe, kiwi, oranges, tomatoes, asparagus, and potatoes. Juices, such as orange, tomato, and prune. Lean meats, including fish. Milk and milk products, such as yogurt. General instructions Take over-the-counter and prescription medicines only as told by your health care provider. This includes vitamins, natural food products, and supplements. Keep all follow-up visits. This is important. Contact a health care provider if: You have weakness that gets worse. You feel your heart pounding or racing. You vomit. You have diarrhea. You have diabetes and you have trouble keeping your blood sugar in your target range. Get help right away if: You have chest pain. You have shortness of breath. You have vomiting or diarrhea that lasts for more than 2 days. You faint. These symptoms may be an emergency. Get help right away. Call 911. Do not wait to see if the symptoms will go away. Do not drive yourself to the hospital. Summary Hypokalemia means that the amount of potassium in the blood is lower than normal. This condition is diagnosed with a blood test. Hypokalemia may be treated by taking potassium supplements, adjusting the medicines that you take, or eating more foods that are high in potassium. If your potassium level is very low, you may need to get potassium through an IV and be monitored in the hospital. This information is not intended to replace advice given to you by your health care provider. Make sure you discuss any questions you have with your health care provider. Document Revised: 09/05/2020 Document Reviewed: 09/05/2020 Elsevier Patient Education  2024 Elsevier Inc.  

## 2022-08-11 NOTE — Assessment & Plan Note (Signed)
Recommend potassium supplementation.  Patient will get IV KCl 10 meq x 1 today

## 2022-08-14 ENCOUNTER — Other Ambulatory Visit: Payer: Self-pay

## 2022-08-21 ENCOUNTER — Other Ambulatory Visit: Payer: Self-pay | Admitting: Oncology

## 2022-08-21 MED ORDER — LENALIDOMIDE 10 MG PO CAPS: 10.0000 mg | ORAL_CAPSULE | Freq: Every day | ORAL | Status: DC

## 2022-08-23 ENCOUNTER — Other Ambulatory Visit: Payer: Self-pay

## 2022-08-24 ENCOUNTER — Other Ambulatory Visit: Payer: Self-pay | Admitting: Pharmacist

## 2022-08-24 ENCOUNTER — Telehealth: Payer: Self-pay

## 2022-08-24 DIAGNOSIS — C9000 Multiple myeloma not having achieved remission: Secondary | ICD-10-CM

## 2022-08-24 MED ORDER — LENALIDOMIDE 10 MG PO CAPS
10.0000 mg | ORAL_CAPSULE | Freq: Every day | ORAL | 0 refills | Status: DC
Start: 2022-08-24 — End: 2022-09-29

## 2022-08-24 NOTE — Telephone Encounter (Signed)
-----   Message from Remi Haggard sent at 08/24/2022 10:46 AM EDT ----- Will do, I will take care of the Revlimid! ----- Message ----- From: Rickard Patience, MD Sent: 08/21/2022   5:33 PM EDT To: Coralee Rud, RN; Paulita Fujita, CMA; #  Nadara Mode and [place],  I have discussed with [facility] and his posttransplant maintenance regimen will be Revlimid 10 mg daily [no break if he tolerates, otherwise I will further adjust] Would you please arrange patient to receive his medication. Please advise him not to start until he sees me.  Thank you  Cathie Hoops Team,  Please arrange patient to see me next week, lab MD, please order CBC CMP.  Thank you\   Janyth Contes

## 2022-08-24 NOTE — Telephone Encounter (Signed)
Dr. Cathie Hoops is wanting patient to start on maintenance lenalidomide. Prescription sent to [facility], patient has used this in the past. Attempted to call patient to let him know rx has been sent, lvm to inform patient and left phone number if he has any additional questions.

## 2022-08-24 NOTE — Telephone Encounter (Signed)
Called pt and unable to reach him. Detailed VM left and Mychart message sent.   Please schedule lab/MD next week and inform pt of appt details.

## 2022-08-25 ENCOUNTER — Other Ambulatory Visit: Payer: Self-pay

## 2022-08-25 ENCOUNTER — Encounter: Payer: Self-pay | Admitting: Oncology

## 2022-08-25 NOTE — Telephone Encounter (Signed)
Called pt again. No answer. Appt reminder mailed.

## 2022-09-01 ENCOUNTER — Encounter: Payer: Self-pay | Admitting: Oncology

## 2022-09-01 ENCOUNTER — Inpatient Hospital Stay: Payer: Commercial Managed Care - PPO

## 2022-09-01 ENCOUNTER — Inpatient Hospital Stay: Payer: Commercial Managed Care - PPO | Admitting: Oncology

## 2022-09-01 ENCOUNTER — Telehealth: Payer: Self-pay

## 2022-09-01 ENCOUNTER — Other Ambulatory Visit (HOSPITAL_COMMUNITY): Payer: Self-pay

## 2022-09-01 VITALS — BP 114/74 | HR 91 | Temp 97.9°F | Resp 18 | Wt 208.0 lb

## 2022-09-01 DIAGNOSIS — Z5111 Encounter for antineoplastic chemotherapy: Secondary | ICD-10-CM

## 2022-09-01 DIAGNOSIS — C9000 Multiple myeloma not having achieved remission: Secondary | ICD-10-CM | POA: Diagnosis not present

## 2022-09-01 DIAGNOSIS — E876 Hypokalemia: Secondary | ICD-10-CM

## 2022-09-01 DIAGNOSIS — C7951 Secondary malignant neoplasm of bone: Secondary | ICD-10-CM | POA: Diagnosis not present

## 2022-09-01 LAB — CMP (CANCER CENTER ONLY)
ALT: 16 U/L (ref 0–44)
AST: 19 U/L (ref 15–41)
Albumin: 4.5 g/dL (ref 3.5–5.0)
Alkaline Phosphatase: 69 U/L (ref 38–126)
Anion gap: 8 (ref 5–15)
BUN: 12 mg/dL (ref 6–20)
CO2: 22 mmol/L (ref 22–32)
Calcium: 9.1 mg/dL (ref 8.9–10.3)
Chloride: 105 mmol/L (ref 98–111)
Creatinine: 1.01 mg/dL (ref 0.61–1.24)
GFR, Estimated: >60 mL/min (ref >60)
Glucose, Bld: 98 mg/dL (ref 70–99)
Potassium: 3.6 mmol/L (ref 3.5–5.1)
Sodium: 135 mmol/L (ref 135–145)
Total Bilirubin: 0.9 mg/dL (ref 0.3–1.2)
Total Protein: 6.8 g/dL (ref 6.5–8.1)

## 2022-09-01 LAB — CBC WITH DIFFERENTIAL (CANCER CENTER ONLY)
Abs Immature Granulocytes: 0.02 10*3/uL (ref 0.00–0.07)
Basophils Absolute: 0 10*3/uL (ref 0.0–0.1)
Basophils Relative: 0 %
Eosinophils Absolute: 0.1 10*3/uL (ref 0.0–0.5)
Eosinophils Relative: 2 %
HCT: 33.9 % — ABNORMAL LOW (ref 39.0–52.0)
Hemoglobin: 11.6 g/dL — ABNORMAL LOW (ref 13.0–17.0)
Immature Granulocytes: 1 %
Lymphocytes Relative: 27 %
Lymphs Abs: 1 10*3/uL (ref 0.7–4.0)
MCH: 28.9 pg (ref 26.0–34.0)
MCHC: 34.2 g/dL (ref 30.0–36.0)
MCV: 84.5 fL (ref 80.0–100.0)
Monocytes Absolute: 0.6 10*3/uL (ref 0.1–1.0)
Monocytes Relative: 16 %
Neutro Abs: 2 10*3/uL (ref 1.7–7.7)
Neutrophils Relative %: 54 %
Platelet Count: 217 10*3/uL (ref 150–400)
RBC: 4.01 MIL/uL — ABNORMAL LOW (ref 4.22–5.81)
RDW: 15.4 % (ref 11.5–15.5)
WBC Count: 3.7 10*3/uL — ABNORMAL LOW (ref 4.0–10.5)
nRBC: 0 % (ref 0.0–0.2)

## 2022-09-01 MED ORDER — POTASSIUM CHLORIDE CRYS ER 20 MEQ PO TBCR: 20.0000 meq | EXTENDED_RELEASE_TABLET | Freq: Every day | ORAL | 0 refills | Status: DC

## 2022-09-01 NOTE — Telephone Encounter (Signed)
Oral Oncology Patient Advocate Encounter  New authorization   Received notification that prior authorization for Lenalidomide is required.   PA submitted on 09/01/22  Key [id number]  Status is pending     Ardeen Fillers, CPhT Oncology Pharmacy Patient Advocate  [facility]  [phone] (phone) [phone] (fax) 09/01/2022 4:15 PM

## 2022-09-01 NOTE — Telephone Encounter (Addendum)
Oral Oncology Patient Advocate Encounter  Prior Authorization for Lenalidomide has been approved.    PA# [id number]  Effective dates: 09/01/22 through 09/01/23  Patient must fill through [facility] per insurance.   Script already sent by MD Office.  Supporting documents sent to [facility] for processing and fulfillment.    Ardeen Fillers, CPhT Oncology Pharmacy Patient Advocate  [facility]  [phone] (phone) [phone] (fax) 09/01/2022 4:17 PM

## 2022-09-01 NOTE — Progress Notes (Signed)
Hematology/Oncology Progress note [phone] [phone]     CHIEF COMPLAINTS/PURPOSE OF CONSULTATION:  Multiple myeloma  ASSESSMENT & PLAN:   Cancer Staging  Multiple myeloma not having achieved remission (HCC) Staging form: Plasma Cell Myeloma and Plasma Cell Disorders, AJCC 8th Edition - Clinical stage from 08/20/2021: RISS Stage II (Beta-2-microglobulin (mg/L): 3.6, Albumin (g/dL): 2.2, ISS: Stage II, High-risk cytogenetics: Absent, LDH: Normal) - Signed by Rickard Patience, MD on 10/14/2021   Multiple myeloma not having achieved remission (HCC) # IgG lamda multiple myeloma, 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 Labs reviewed and discussed with him.   Stable counts. Bone marrow biopsy done at [facility] showed no plasma cell.  Cytogenetics, molecular studies are pending. Post transplant PET scan showed no hypermetabolic activity Discussed case with Dr. Pandora Leiter. Will start patient on Revlimid 10mg  daily, if he tolerates.  If he develops cytopenia, could consider change to 3 weeks on 1 week off.  Patient will start once he gets his supply.  Recommend Aspirin 81mg  daily  Acyclovir daily for 1 year post transplant.    Hypokalemia Recommend potassium supplementation.    Encounter for antineoplastic chemotherapy Chemotherapy plan as listed above.   Metastatic cancer to bone [facility]) Plan to start bisphosphonate in the near future. PET showed no active disease.  I ask him to obtain dental clearance .   Follow-up in 4 weeks All questions were answered. The patient knows to call the clinic with any problems, questions or concerns. No barriers to learning was detected.  Rickard Patience, MD 09/01/2022    HISTORY OF PRESENTING ILLNESS:  Tanner [name] 37 y.o. male presents  for follow up of Multiple myeloma I have reviewed his chart and materials related to his cancer extensively and collaborated history with the  patient. Summary of oncologic history is as follows: Oncology History  Multiple myeloma not having achieved remission (HCC)  08/14/2021 Imaging   CT chest abdomen pelvis without contrast Showed numerous lucencies throughout the bone structures, most pronounced throughout the spine and left left iliac bone.  Otherwise no acute findings in the chest abdomen or pelvis.  Sigmoid diverticulosis.   08/19/2021 Initial Diagnosis   Multiple myeloma not having achieved remission, stage II  -08/15/2021, bone marrow biopsy showed plasma cell myeloma involving greater than 80% of the marrow. Cytogenetics Myeloma  FISH   08/20/2021 Cancer Staging   Staging form: Plasma Cell Myeloma and Plasma Cell Disorders, AJCC 8th Edition - Clinical stage from 08/20/2021: RISS Stage II (Beta-2-microglobulin (mg/L): 3.6, Albumin (g/dL): 2.2, ISS: Stage II, High-risk cytogenetics: Absent, LDH: Normal) - Signed by Rickard Patience, MD on 10/14/2021 Beta 2 microglobulin range (mg/L): 3.5 to 5.49 Albumin range (g/dL): Less than 3.5 Cytogenetics: No abnormalities   08/20/2021 -  Chemotherapy   MYELOMA NEWLY DIAGNOSED TRANSPLANT CANDIDATE DaraVRd (Daratumumab SUBQ) q21d x 6 Cycles (Induction/Consolidation)      09/11/2021 Imaging   PET scan showed  1. Extensive multifocal hypermetabolic disease throughout the axial and appendicular skeleton, with numerous lucent osseous lesions,consistent with patient's known diagnosis of multiple myeloma. 2. No tracer avid lymph node or mass identified to suggest soft tissue plasmacytoma   12/18/2021 Imaging   MRI lumbar 1. Multiple scattered enhancing marrow replacing bone lesions within the imaged spine and pelvis consistent with known history of multiple myeloma. No pathologic fracture or extraosseous soft tissue component. 2. Unchanged mild multilevel degenerative changes of the lumbar spine resulting in mild canal stenosis at L2-L3 and L3-L4.  3. Left greater than right subarticular recess  stenosis and mild left foraminal stenosis at L5-S1.      02/25/2022 Imaging   PET  1. Decreased FDG avidity associated with the multifocal hypermetabolic osseous lesions, consistent with treatment response. 2. Unchanged pathologic fracture of the T8 superior endplate. 3. Prominent bilateral cervical lymph nodes are similar in size to prior but now demonstrate mildly metabolic activity, nonspecific but favored to be reactive. Attention on follow-up imaging suggested 4. Hypermetabolic hyperplasia of the tonsils, also favored reactive.   05/22/2022 Bone Marrow Transplant   Patient underwent autologous stem cell transplant. 05/30/2022, neutropenic fever.   06/03/2022 engraftment 06/04/2022, G-CSF stopped.    08/21/2022 Imaging   PET  No evidence of hypermetabolic myeloma.      INTERVAL HISTORY Tanner [name] is a 37 y.o. male who has above history reviewed by me today presents for follow up visit for management of multiple myeloma Patient is status post autologous stem cell transplant.   He is doing well. He denies fever, chills, nausea vomiting diarrhea.  MEDICAL HISTORY:  Past Medical History:  Diagnosis Date   Alcohol use    Sciatica    Smoking     SURGICAL HISTORY: Past Surgical History:  Procedure Laterality Date   INGUINAL HERNIA REPAIR      SOCIAL HISTORY: Social History   Socioeconomic History   Marital status: Single    Spouse name: Not on file   Number of children: Not on file   Years of education: Not on file   Highest education level: Not on file  Occupational History   Not on file  Tobacco Use   Smoking status: Former    Current packs/day: 0.00    Average packs/day: 0.5 packs/day for 10.0 years (5.0 ttl pk-yrs)    Types: E-cigarettes, Cigarettes    Start date: 08/12/2011    Quit date: 08/11/2021    Years since quitting: 1.0   Smokeless tobacco: Never  Vaping Use   Vaping status: Every Day  Substance and Sexual Activity   Alcohol use: Not  Currently    Alcohol/week: 24.0 standard drinks of alcohol    Types: 24 Cans of beer per week    Comment: quit drinking approx 08/11/2021   Drug use: Never   Sexual activity: Not on file  Other Topics Concern   Not on file  Social History Narrative   Not on file   Social Determinants of Health   Financial Resource Strain: Low Risk  (05/30/2022)   Received from [facility], [facility]   Overall Financial Resource Strain (CARDIA)    Difficulty of Paying Living Expenses: Not hard at all  Food Insecurity: No Food Insecurity (05/30/2022)   Received from [facility], [facility]   Hunger Vital Sign    Worried About Running Out of Food in the Last Year: Never true    Ran Out of Food in the Last Year: Never true  Transportation Needs: No Transportation Needs (06/01/2022)   Received from [facility], [facility]   [facility] - Transportation    In the past 12 months, has lack of transportation kept you from medical appointments or from getting medications?: No    Lack of Transportation (Non-Medical): No  Physical Activity: Not on file  Stress: Stress Concern Present (01/13/2022)   Received from [facility], [facility]   [employer] [facility] - Occupational Stress  Questionnaire    Feeling of Stress : To some extent  Social Connections: Not on file  Intimate Partner Violence: Not on file    FAMILY HISTORY: Family History  Problem Relation Age of Onset   Ovarian cancer Mother    Asthma Mother    Alcoholism Father    Throat cancer Maternal Uncle    Heart disease Maternal Uncle    Lung cancer Maternal Uncle    Lymphoma Maternal Uncle    Lung cancer Paternal Grandmother     ALLERGIES:  has No Known Allergies.  MEDICATIONS:  Current Outpatient Medications  Medication Sig Dispense Refill   acyclovir (ZOVIRAX) 400 MG tablet Take  1 tablet (400 mg total) by mouth 2 (two) times daily. 60 tablet 5   magnesium chloride (SLOW-MAG) 64 MG TBEC SR tablet Take 1 tablet (64 mg total) by mouth daily. 30 tablet 0   aspirin EC 81 MG tablet Take 81 mg by mouth daily. Swallow whole. (Patient not taking: Reported on 06/15/2022)     lenalidomide (REVLIMID) 10 MG capsule Take 1 capsule (10 mg total) by mouth daily. (Patient not taking: Reported on 09/01/2022) 28 capsule 0   OLANZapine (ZYPREXA) 5 MG tablet Take 1 tablet (5 mg total) by mouth at bedtime. (Patient not taking: Reported on 06/15/2022) 30 tablet 1   ondansetron (ZOFRAN) 8 MG tablet Take 1 tablet (8 mg total) by mouth 2 (two) times daily as needed for nausea or vomiting. (Patient not taking: Reported on 09/01/2022) 20 tablet 2   potassium chloride SA (KLOR-CON M20) 20 MEQ tablet Take 1 tablet (20 mEq total) by mouth daily. 30 tablet 0   senna-docusate (SENOKOT-S) 8.6-50 MG tablet Take 1 tablet by mouth daily. (Patient not taking: Reported on 06/15/2022) 60 tablet 2   traMADol (ULTRAM) 50 MG tablet Take 1 tablet (50 mg total) by mouth every 6 (six) hours as needed. (Patient not taking: Reported on 06/15/2022) 30 tablet 0   No current facility-administered medications for this visit.    Review of Systems  Constitutional:  Positive for fatigue. Negative for appetite change, chills, fever and unexpected weight change.  HENT:   Negative for hearing loss and voice change.   Eyes:  Negative for eye problems and icterus.  Respiratory:  Negative for chest tightness, cough and shortness of breath.   Cardiovascular:  Negative for chest pain and leg swelling.  Gastrointestinal:  Negative for abdominal distention and abdominal pain.  Endocrine: Negative for hot flashes.  Genitourinary:  Negative for difficulty urinating, dysuria and frequency.   Musculoskeletal:  Negative for arthralgias and back pain.  Skin:  Negative for itching and rash.  Neurological:  Negative for light-headedness and  numbness.  Hematological:  Negative for adenopathy. Does not bruise/bleed easily.  Psychiatric/Behavioral:  Negative for confusion.      PHYSICAL EXAMINATION: ECOG PERFORMANCE STATUS: 1 - Symptomatic but completely ambulatory  Vitals:   09/01/22 1020  BP: 114/74  Pulse: 91  Resp: 18  Temp: 97.9 F (36.6 C)   Filed Weights   09/01/22 1020  Weight: 208 lb (94.3 kg)    Physical Exam Constitutional:      General: He is not in acute distress.    Appearance: He is obese. He is not diaphoretic.  HENT:     Head: Normocephalic and atraumatic.     Mouth/Throat:     Pharynx: No oropharyngeal exudate.  Eyes:     General: No scleral icterus. Cardiovascular:     Rate and Rhythm: Normal  rate.     Heart sounds: No murmur heard. Pulmonary:     Effort: Pulmonary effort is normal. No respiratory distress.  Abdominal:     General: There is no distension.     Palpations: Abdomen is soft.  Musculoskeletal:        General: Normal range of motion.     Cervical back: Normal range of motion.  Skin:    General: Skin is warm and dry.     Findings: No erythema.  Neurological:     Mental Status: He is alert and oriented to person, place, and time. Mental status is at baseline.     Cranial Nerves: No cranial nerve deficit.     Motor: No abnormal muscle tone.  Psychiatric:        Mood and Affect: Mood and affect normal.      LABORATORY DATA:  I have reviewed the data as listed    Latest Ref Rng & Units 09/01/2022   10:10 AM 08/11/2022   12:49 PM 07/14/2022   12:41 PM  CBC  WBC 4.0 - 10.5 K/uL 3.7  2.2  3.4   Hemoglobin 13.0 - 17.0 g/dL 78.4  69.6  29.5   Hematocrit 39.0 - 52.0 % 33.9  32.2  32.5   Platelets 150 - 400 K/uL 217  194  234       Latest Ref Rng & Units 09/01/2022   10:10 AM 08/11/2022   12:49 PM 07/14/2022   12:41 PM  CMP  Glucose 70 - 99 mg/dL 98  97  284   BUN 6 - 20 mg/dL 12  7  7    Creatinine 0.61 - 1.24 mg/dL 1.32  4.40  1.02   Sodium 135 - 145 mmol/L 135  136  138    Potassium 3.5 - 5.1 mmol/L 3.6  3.1  3.3   Chloride 98 - 111 mmol/L 105  107  104   CO2 22 - 32 mmol/L 22  21  26    Calcium 8.9 - 10.3 mg/dL 9.1  8.8  9.0   Total Protein 6.5 - 8.1 g/dL 6.8  6.5  6.7   Total Bilirubin 0.3 - 1.2 mg/dL 0.9  1.0  0.8   Alkaline Phos 38 - 126 U/L 69  59  64   AST 15 - 41 U/L 19  14  18    ALT 0 - 44 U/L 16  14  16         RADIOGRAPHIC STUDIES: I have personally reviewed the radiological images as listed and agreed with the findings in the report. No results found.

## 2022-09-01 NOTE — Assessment & Plan Note (Signed)
Plan to start bisphosphonate in the near future. PET showed no active disease.  I ask him to obtain dental clearance .

## 2022-09-01 NOTE — Assessment & Plan Note (Signed)
Chemotherapy plan as listed above 

## 2022-09-01 NOTE — Assessment & Plan Note (Signed)
Recommend potassium supplementation.

## 2022-09-01 NOTE — Assessment & Plan Note (Signed)
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 Labs reviewed and discussed with him.   Stable counts. Bone marrow biopsy done at [facility] showed no plasma cell.  Cytogenetics, molecular studies are pending. Post transplant PET scan showed no hypermetabolic activity Discussed case with Dr. Pandora Leiter. Will start patient on Revlimid 10mg  daily, if he tolerates.  If he develops cytopenia, could consider change to 3 weeks on 1 week off.  Patient will start once he gets his supply.  Recommend Aspirin 81mg  daily  Acyclovir daily for 1 year post transplant.

## 2022-09-02 ENCOUNTER — Other Ambulatory Visit: Payer: Self-pay

## 2022-09-29 ENCOUNTER — Encounter: Payer: Self-pay | Admitting: Oncology

## 2022-09-29 ENCOUNTER — Inpatient Hospital Stay: Payer: Commercial Managed Care - PPO | Attending: Oncology

## 2022-09-29 ENCOUNTER — Other Ambulatory Visit: Payer: Self-pay | Admitting: Oncology

## 2022-09-29 ENCOUNTER — Inpatient Hospital Stay (HOSPITAL_BASED_OUTPATIENT_CLINIC_OR_DEPARTMENT_OTHER): Payer: Commercial Managed Care - PPO | Admitting: Oncology

## 2022-09-29 VITALS — BP 128/82 | HR 85 | Temp 97.6°F | Resp 18 | Wt 217.5 lb

## 2022-09-29 DIAGNOSIS — C7951 Secondary malignant neoplasm of bone: Secondary | ICD-10-CM

## 2022-09-29 DIAGNOSIS — Z5111 Encounter for antineoplastic chemotherapy: Secondary | ICD-10-CM | POA: Diagnosis not present

## 2022-09-29 DIAGNOSIS — D649 Anemia, unspecified: Secondary | ICD-10-CM | POA: Insufficient documentation

## 2022-09-29 DIAGNOSIS — C9000 Multiple myeloma not having achieved remission: Secondary | ICD-10-CM

## 2022-09-29 DIAGNOSIS — E876 Hypokalemia: Secondary | ICD-10-CM | POA: Diagnosis not present

## 2022-09-29 DIAGNOSIS — N189 Chronic kidney disease, unspecified: Secondary | ICD-10-CM

## 2022-09-29 DIAGNOSIS — Z79899 Other long term (current) drug therapy: Secondary | ICD-10-CM | POA: Insufficient documentation

## 2022-09-29 DIAGNOSIS — Z9484 Stem cells transplant status: Secondary | ICD-10-CM | POA: Diagnosis not present

## 2022-09-29 LAB — CBC WITH DIFFERENTIAL (CANCER CENTER ONLY)
Abs Immature Granulocytes: 0.01 10*3/uL (ref 0.00–0.07)
Basophils Absolute: 0 10*3/uL (ref 0.0–0.1)
Basophils Relative: 0 %
Eosinophils Absolute: 0.1 10*3/uL (ref 0.0–0.5)
Eosinophils Relative: 3 %
HCT: 36.1 % — ABNORMAL LOW (ref 39.0–52.0)
Hemoglobin: 12.1 g/dL — ABNORMAL LOW (ref 13.0–17.0)
Immature Granulocytes: 0 %
Lymphocytes Relative: 17 %
Lymphs Abs: 0.7 10*3/uL (ref 0.7–4.0)
MCH: 28.1 pg (ref 26.0–34.0)
MCHC: 33.5 g/dL (ref 30.0–36.0)
MCV: 84 fL (ref 80.0–100.0)
Monocytes Absolute: 0.5 10*3/uL (ref 0.1–1.0)
Monocytes Relative: 11 %
Neutro Abs: 3 10*3/uL (ref 1.7–7.7)
Neutrophils Relative %: 69 %
Platelet Count: 212 10*3/uL (ref 150–400)
RBC: 4.3 MIL/uL (ref 4.22–5.81)
RDW: 14.4 % (ref 11.5–15.5)
WBC Count: 4.3 10*3/uL (ref 4.0–10.5)
nRBC: 0 % (ref 0.0–0.2)

## 2022-09-29 LAB — CMP (CANCER CENTER ONLY)
ALT: 15 U/L (ref 0–44)
AST: 18 U/L (ref 15–41)
Albumin: 4.4 g/dL (ref 3.5–5.0)
Alkaline Phosphatase: 68 U/L (ref 38–126)
Anion gap: 7 (ref 5–15)
BUN: 14 mg/dL (ref 6–20)
CO2: 24 mmol/L (ref 22–32)
Calcium: 9.2 mg/dL (ref 8.9–10.3)
Chloride: 107 mmol/L (ref 98–111)
Creatinine: 0.88 mg/dL (ref 0.61–1.24)
GFR, Estimated: >60 mL/min (ref >60)
Glucose, Bld: 92 mg/dL (ref 70–99)
Potassium: 4 mmol/L (ref 3.5–5.1)
Sodium: 138 mmol/L (ref 135–145)
Total Bilirubin: 0.8 mg/dL (ref 0.3–1.2)
Total Protein: 6.8 g/dL (ref 6.5–8.1)

## 2022-09-29 LAB — MAGNESIUM: Magnesium: 2.2 mg/dL (ref 1.7–2.4)

## 2022-09-29 MED ORDER — POTASSIUM CHLORIDE CRYS ER 20 MEQ PO TBCR: 20.0000 meq | EXTENDED_RELEASE_TABLET | Freq: Every day | ORAL | 0 refills | Status: DC

## 2022-09-29 NOTE — Assessment & Plan Note (Signed)
Not cleared by dentist to start bisphosphonate. He has dental work that needs to be done first.  PET showed no active disease.

## 2022-09-29 NOTE — Progress Notes (Signed)
Hematology/Oncology Progress note [phone] [phone]     CHIEF COMPLAINTS/PURPOSE OF CONSULTATION:  Multiple myeloma  ASSESSMENT & PLAN:   Cancer Staging  Multiple myeloma not having achieved remission (HCC) Staging form: Plasma Cell Myeloma and Plasma Cell Disorders, AJCC 8th Edition - Clinical stage from 08/20/2021: RISS Stage II (Beta-2-microglobulin (mg/L): 3.6, Albumin (g/dL): 2.2, ISS: Stage II, High-risk cytogenetics: Absent, LDH: Normal) - Signed by Rickard Patience, MD on 10/14/2021   Multiple myeloma not having achieved remission (HCC) # IgG lamda multiple myeloma, 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 Labs reviewed and discussed with him.   Stable counts. Bone marrow biopsy done at [facility] showed no plasma cell.  Cytogenetics, molecular studies are pending. Post transplant PET scan showed no hypermetabolic activity Discussed case with Dr. Pandora Leiter. Will start patient on Revlimid 10mg  daily, if he tolerates.  If he develops cytopenia, could consider change to 3 weeks on 1 week off.  Labs are reviewed and discussed with patient. Counts are stable.  Recommend patient to continue Revlimid 10mg  daily  Recommend Aspirin 81mg  daily  Acyclovir daily for 1 year post transplant.   Hypokalemia Recommend potassium supplementation. K has improved.    Encounter for antineoplastic chemotherapy Chemotherapy plan as listed above.   Normocytic anemia Iron panel showed ferritin of 567.  Iron saturation normal.  Not typical iron deficiency. Hb has improved. Monitor.   Hypomagnesemia Normal magnesium. Recommend slow mag 1 tab daily.   CKD (chronic kidney disease) Avoid nephrotoxins, encourage oral hydration  Metastatic cancer to bone [facility]) Not cleared by dentist to start bisphosphonate. He has dental work that needs to be done first.  PET showed no active disease.    Follow-up in 4 weeks All questions were  answered. The patient knows to call the clinic with any problems, questions or concerns. No barriers to learning was detected.  Rickard Patience, MD 09/29/2022    HISTORY OF PRESENTING ILLNESS:  Tanner [name] 37 y.o. male presents  for follow up of Multiple myeloma I have reviewed his chart and materials related to his cancer extensively and collaborated history with the patient. Summary of oncologic history is as follows: Oncology History  Multiple myeloma not having achieved remission (HCC)  08/14/2021 Imaging   CT chest abdomen pelvis without contrast Showed numerous lucencies throughout the bone structures, most pronounced throughout the spine and left left iliac bone.  Otherwise no acute findings in the chest abdomen or pelvis.  Sigmoid diverticulosis.   08/19/2021 Initial Diagnosis   Multiple myeloma not having achieved remission, stage II  -08/15/2021, bone marrow biopsy showed plasma cell myeloma involving greater than 80% of the marrow. Cytogenetics Myeloma  FISH   08/20/2021 Cancer Staging   Staging form: Plasma Cell Myeloma and Plasma Cell Disorders, AJCC 8th Edition - Clinical stage from 08/20/2021: RISS Stage II (Beta-2-microglobulin (mg/L): 3.6, Albumin (g/dL): 2.2, ISS: Stage II, High-risk cytogenetics: Absent, LDH: Normal) - Signed by Rickard Patience, MD on 10/14/2021 Beta 2 microglobulin range (mg/L): 3.5 to 5.49 Albumin range (g/dL): Less than 3.5 Cytogenetics: No abnormalities   08/20/2021 -  Chemotherapy   MYELOMA NEWLY DIAGNOSED TRANSPLANT CANDIDATE DaraVRd (Daratumumab SUBQ) q21d x 6 Cycles (Induction/Consolidation)      09/11/2021 Imaging   PET scan showed  1. Extensive multifocal hypermetabolic disease throughout the axial and appendicular skeleton, with numerous lucent osseous lesions,consistent with patient's known diagnosis of multiple myeloma. 2. No tracer avid lymph node or mass identified to suggest soft tissue  plasmacytoma   12/18/2021 Imaging   MRI  lumbar 1. Multiple scattered enhancing marrow replacing bone lesions within the imaged spine and pelvis consistent with known history of multiple myeloma. No pathologic fracture or extraosseous soft tissue component. 2. Unchanged mild multilevel degenerative changes of the lumbar spine resulting in mild canal stenosis at L2-L3 and L3-L4.  3. Left greater than right subarticular recess stenosis and mild left foraminal stenosis at L5-S1.      02/25/2022 Imaging   PET  1. Decreased FDG avidity associated with the multifocal hypermetabolic osseous lesions, consistent with treatment response. 2. Unchanged pathologic fracture of the T8 superior endplate. 3. Prominent bilateral cervical lymph nodes are similar in size to prior but now demonstrate mildly metabolic activity, nonspecific but favored to be reactive. Attention on follow-up imaging suggested 4. Hypermetabolic hyperplasia of the tonsils, also favored reactive.   05/22/2022 Bone Marrow Transplant   Patient underwent autologous stem cell transplant. 05/30/2022, neutropenic fever.   06/03/2022 engraftment 06/04/2022, G-CSF stopped.    08/21/2022 Imaging   PET  No evidence of hypermetabolic myeloma.      INTERVAL HISTORY Tanner [name] is a 37 y.o. male who has above history reviewed by me today presents for follow up visit for management of multiple myeloma Patient is status post autologous stem cell transplant.   He is doing well.he takes Revlimid 10mg  daily. He tolerates well. He denies fever, chills, nausea vomiting diarrhea.  MEDICAL HISTORY:  Past Medical History:  Diagnosis Date   Alcohol use    Sciatica    Smoking     SURGICAL HISTORY: Past Surgical History:  Procedure Laterality Date   INGUINAL HERNIA REPAIR      SOCIAL HISTORY: Social History   Socioeconomic History   Marital status: Single    Spouse name: Not on file   Number of children: Not on file   Years of education: Not on file   Highest  education level: Not on file  Occupational History   Not on file  Tobacco Use   Smoking status: Former    Current packs/day: 0.00    Average packs/day: 0.5 packs/day for 10.0 years (5.0 ttl pk-yrs)    Types: E-cigarettes, Cigarettes    Start date: 08/12/2011    Quit date: 08/11/2021    Years since quitting: 1.1   Smokeless tobacco: Never  Vaping Use   Vaping status: Every Day  Substance and Sexual Activity   Alcohol use: Not Currently    Alcohol/week: 24.0 standard drinks of alcohol    Types: 24 Cans of beer per week    Comment: quit drinking approx 08/11/2021   Drug use: Never   Sexual activity: Not on file  Other Topics Concern   Not on file  Social History Narrative   Not on file   Social Determinants of Health   Financial Resource Strain: Low Risk  (05/30/2022)   Received from [facility], [facility]   Overall Financial Resource Strain (CARDIA)    Difficulty of Paying Living Expenses: Not hard at all  Food Insecurity: No Food Insecurity (05/30/2022)   Received from [facility], [facility]   Hunger Vital Sign    Worried About Running Out of Food in the Last Year: Never true    Ran Out of Food in the Last Year: Never true  Transportation Needs: No Transportation Needs (06/01/2022)   Received from [facility], [facility]   PRAPARE -  Transportation    In the past 12 months, has lack of transportation kept you from medical appointments or from getting medications?: No    Lack of Transportation (Non-Medical): No  Physical Activity: Not on file  Stress: Stress Concern Present (01/13/2022)   Received from [facility], [facility]   [employer] of Occupational Health - Occupational Stress Questionnaire    Feeling of Stress : To some extent  Social Connections: Not on file  Intimate Partner Violence: Not on file    FAMILY  HISTORY: Family History  Problem Relation Age of Onset   Ovarian cancer Mother    Asthma Mother    Alcoholism Father    Throat cancer Maternal Uncle    Heart disease Maternal Uncle    Lung cancer Maternal Uncle    Lymphoma Maternal Uncle    Lung cancer Paternal Grandmother     ALLERGIES:  has No Known Allergies.  MEDICATIONS:  Current Outpatient Medications  Medication Sig Dispense Refill   acyclovir (ZOVIRAX) 400 MG tablet Take 1 tablet (400 mg total) by mouth 2 (two) times daily. 60 tablet 5   magnesium chloride (SLOW-MAG) 64 MG TBEC SR tablet Take 1 tablet (64 mg total) by mouth daily. 30 tablet 0   aspirin EC 81 MG tablet Take 81 mg by mouth daily. Swallow whole. (Patient not taking: Reported on 06/15/2022)     OLANZapine (ZYPREXA) 5 MG tablet Take 1 tablet (5 mg total) by mouth at bedtime. (Patient not taking: Reported on 06/15/2022) 30 tablet 1   ondansetron (ZOFRAN) 8 MG tablet Take 1 tablet (8 mg total) by mouth 2 (two) times daily as needed for nausea or vomiting. (Patient not taking: Reported on 09/01/2022) 20 tablet 2   potassium chloride SA (KLOR-CON M20) 20 MEQ tablet Take 1 tablet (20 mEq total) by mouth daily. 90 tablet 0   REVLIMID 10 MG capsule TAKE 1 CAPSULE BY MOUTH 1 TIME A DAY 28 capsule 0   senna-docusate (SENOKOT-S) 8.6-50 MG tablet Take 1 tablet by mouth daily. (Patient not taking: Reported on 06/15/2022) 60 tablet 2   traMADol (ULTRAM) 50 MG tablet Take 1 tablet (50 mg total) by mouth every 6 (six) hours as needed. (Patient not taking: Reported on 06/15/2022) 30 tablet 0   No current facility-administered medications for this visit.    Review of Systems  Constitutional:  Positive for fatigue. Negative for appetite change, chills, fever and unexpected weight change.  HENT:   Negative for hearing loss and voice change.   Eyes:  Negative for eye problems and icterus.  Respiratory:  Negative for chest tightness, cough and shortness of breath.   Cardiovascular:   Negative for chest pain and leg swelling.  Gastrointestinal:  Negative for abdominal distention and abdominal pain.  Endocrine: Negative for hot flashes.  Genitourinary:  Negative for difficulty urinating, dysuria and frequency.   Musculoskeletal:  Negative for arthralgias and back pain.  Skin:  Negative for itching and rash.  Neurological:  Negative for light-headedness and numbness.  Hematological:  Negative for adenopathy. Does not bruise/bleed easily.  Psychiatric/Behavioral:  Negative for confusion.      PHYSICAL EXAMINATION: ECOG PERFORMANCE STATUS: 1 - Symptomatic but completely ambulatory  Vitals:   09/29/22 1050  BP: 128/82  Pulse: 85  Resp: 18  Temp: 97.6 F (36.4 C)  SpO2: 100%   Filed Weights   09/29/22 1050  Weight: 217 lb 8 oz (98.7 kg)    Physical Exam Constitutional:  General: He is not in acute distress.    Appearance: He is obese. He is not diaphoretic.  HENT:     Head: Normocephalic and atraumatic.     Mouth/Throat:     Pharynx: No oropharyngeal exudate.  Eyes:     General: No scleral icterus. Cardiovascular:     Rate and Rhythm: Normal rate.     Heart sounds: No murmur heard. Pulmonary:     Effort: Pulmonary effort is normal. No respiratory distress.  Abdominal:     General: There is no distension.     Palpations: Abdomen is soft.  Musculoskeletal:        General: Normal range of motion.     Cervical back: Normal range of motion.  Skin:    General: Skin is warm and dry.     Findings: No erythema.  Neurological:     Mental Status: He is alert and oriented to person, place, and time. Mental status is at baseline.     Cranial Nerves: No cranial nerve deficit.     Motor: No abnormal muscle tone.  Psychiatric:        Mood and Affect: Mood and affect normal.      LABORATORY DATA:  I have reviewed the data as listed    Latest Ref Rng & Units 09/29/2022   10:34 AM 09/01/2022   10:10 AM 08/11/2022   12:49 PM  CBC  WBC 4.0 - 10.5 K/uL  4.3  3.7  2.2   Hemoglobin 13.0 - 17.0 g/dL 82.9  56.2  13.0   Hematocrit 39.0 - 52.0 % 36.1  33.9  32.2   Platelets 150 - 400 K/uL 212  217  194       Latest Ref Rng & Units 09/29/2022   10:35 AM 09/01/2022   10:10 AM 08/11/2022   12:49 PM  CMP  Glucose 70 - 99 mg/dL 92  98  97   BUN 6 - 20 mg/dL 14  12  7    Creatinine 0.61 - 1.24 mg/dL 8.65  7.84  6.96   Sodium 135 - 145 mmol/L 138  135  136   Potassium 3.5 - 5.1 mmol/L 4.0  3.6  3.1   Chloride 98 - 111 mmol/L 107  105  107   CO2 22 - 32 mmol/L 24  22  21    Calcium 8.9 - 10.3 mg/dL 9.2  9.1  8.8   Total Protein 6.5 - 8.1 g/dL 6.8  6.8  6.5   Total Bilirubin 0.3 - 1.2 mg/dL 0.8  0.9  1.0   Alkaline Phos 38 - 126 U/L 68  69  59   AST 15 - 41 U/L 18  19  14    ALT 0 - 44 U/L 15  16  14         RADIOGRAPHIC STUDIES: I have personally reviewed the radiological images as listed and agreed with the findings in the report. No results found.

## 2022-09-29 NOTE — Assessment & Plan Note (Signed)
Normal magnesium. Recommend slow mag 1 tab daily.

## 2022-09-29 NOTE — Assessment & Plan Note (Signed)
Recommend potassium supplementation. K has improved.

## 2022-09-29 NOTE — Assessment & Plan Note (Signed)
Avoid nephrotoxins, encourage oral hydration

## 2022-09-29 NOTE — Assessment & Plan Note (Signed)
Chemotherapy plan as listed above 

## 2022-09-29 NOTE — Assessment & Plan Note (Signed)
Iron panel showed ferritin of 567.  Iron saturation normal.  Not typical iron deficiency. Hb has improved. Monitor.

## 2022-09-29 NOTE — Assessment & Plan Note (Addendum)
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 Labs reviewed and discussed with him.   Stable counts. Bone marrow biopsy done at [facility] showed no plasma cell.  Cytogenetics, molecular studies are pending. Post transplant PET scan showed no hypermetabolic activity Discussed case with Dr. Pandora Leiter. Will start patient on Revlimid 10mg  daily, if he tolerates.  If he develops cytopenia, could consider change to 3 weeks on 1 week off.  Labs are reviewed and discussed with patient. Counts are stable.  Recommend patient to continue Revlimid 10mg  daily  Recommend Aspirin 81mg  daily  Acyclovir daily for 1 year post transplant.

## 2022-09-30 LAB — KAPPA/LAMBDA LIGHT CHAINS
Kappa free light chain: 6.1 mg/L (ref 3.3–19.4)
Kappa, lambda light chain ratio: 2.03 — ABNORMAL HIGH (ref 0.26–1.65)
Lambda free light chains: 3 mg/L — ABNORMAL LOW (ref 5.7–26.3)

## 2022-10-01 ENCOUNTER — Other Ambulatory Visit: Payer: Self-pay

## 2022-10-02 LAB — MULTIPLE MYELOMA PANEL, SERUM
Albumin SerPl Elph-Mcnc: 4 g/dL (ref 2.9–4.4)
Albumin/Glob SerPl: 2.1 — ABNORMAL HIGH (ref 0.7–1.7)
Alpha 1: 0.2 g/dL (ref 0.0–0.4)
Alpha2 Glob SerPl Elph-Mcnc: 0.6 g/dL (ref 0.4–1.0)
B-Globulin SerPl Elph-Mcnc: 0.9 g/dL (ref 0.7–1.3)
Gamma Glob SerPl Elph-Mcnc: 0.4 g/dL (ref 0.4–1.8)
Globulin, Total: 2 g/dL — ABNORMAL LOW (ref 2.2–3.9)
IgA: 28 mg/dL — ABNORMAL LOW (ref 90–386)
IgG (Immunoglobin G), Serum: 538 mg/dL — ABNORMAL LOW (ref 603–1613)
IgM (Immunoglobulin M), Srm: 10 mg/dL — ABNORMAL LOW (ref 20–172)
Total Protein ELP: 6 g/dL (ref 6.0–8.5)

## 2022-10-18 ENCOUNTER — Other Ambulatory Visit: Payer: Self-pay

## 2022-10-28 ENCOUNTER — Inpatient Hospital Stay (HOSPITAL_BASED_OUTPATIENT_CLINIC_OR_DEPARTMENT_OTHER): Payer: Commercial Managed Care - PPO | Admitting: Oncology

## 2022-10-28 ENCOUNTER — Encounter: Payer: Self-pay | Admitting: Oncology

## 2022-10-28 ENCOUNTER — Inpatient Hospital Stay: Payer: Commercial Managed Care - PPO | Attending: Oncology

## 2022-10-28 VITALS — BP 124/81 | HR 79 | Temp 97.5°F | Resp 18 | Wt 228.2 lb

## 2022-10-28 DIAGNOSIS — Z9484 Stem cells transplant status: Secondary | ICD-10-CM | POA: Diagnosis not present

## 2022-10-28 DIAGNOSIS — E876 Hypokalemia: Secondary | ICD-10-CM | POA: Diagnosis not present

## 2022-10-28 DIAGNOSIS — Z87891 Personal history of nicotine dependence: Secondary | ICD-10-CM | POA: Insufficient documentation

## 2022-10-28 DIAGNOSIS — C9001 Multiple myeloma in remission: Secondary | ICD-10-CM

## 2022-10-28 DIAGNOSIS — Z5111 Encounter for antineoplastic chemotherapy: Secondary | ICD-10-CM

## 2022-10-28 DIAGNOSIS — C7951 Secondary malignant neoplasm of bone: Secondary | ICD-10-CM | POA: Diagnosis present

## 2022-10-28 DIAGNOSIS — C9000 Multiple myeloma not having achieved remission: Secondary | ICD-10-CM

## 2022-10-28 DIAGNOSIS — Z79899 Other long term (current) drug therapy: Secondary | ICD-10-CM | POA: Insufficient documentation

## 2022-10-28 DIAGNOSIS — D649 Anemia, unspecified: Secondary | ICD-10-CM | POA: Diagnosis not present

## 2022-10-28 LAB — CBC WITH DIFFERENTIAL (CANCER CENTER ONLY)
Abs Immature Granulocytes: 0.02 10*3/uL (ref 0.00–0.07)
Basophils Absolute: 0 10*3/uL (ref 0.0–0.1)
Basophils Relative: 1 %
Eosinophils Absolute: 0.1 10*3/uL (ref 0.0–0.5)
Eosinophils Relative: 2 %
HCT: 33.6 % — ABNORMAL LOW (ref 39.0–52.0)
Hemoglobin: 11.6 g/dL — ABNORMAL LOW (ref 13.0–17.0)
Immature Granulocytes: 1 %
Lymphocytes Relative: 26 %
Lymphs Abs: 1 10*3/uL (ref 0.7–4.0)
MCH: 28.4 pg (ref 26.0–34.0)
MCHC: 34.5 g/dL (ref 30.0–36.0)
MCV: 82.4 fL (ref 80.0–100.0)
Monocytes Absolute: 0.5 10*3/uL (ref 0.1–1.0)
Monocytes Relative: 11 %
Neutro Abs: 2.4 10*3/uL (ref 1.7–7.7)
Neutrophils Relative %: 59 %
Platelet Count: 214 10*3/uL (ref 150–400)
RBC: 4.08 MIL/uL — ABNORMAL LOW (ref 4.22–5.81)
RDW: 13.9 % (ref 11.5–15.5)
WBC Count: 4 10*3/uL (ref 4.0–10.5)
nRBC: 0 % (ref 0.0–0.2)

## 2022-10-28 LAB — CMP (CANCER CENTER ONLY)
ALT: 20 U/L (ref 0–44)
AST: 23 U/L (ref 15–41)
Albumin: 4.4 g/dL (ref 3.5–5.0)
Alkaline Phosphatase: 62 U/L (ref 38–126)
Anion gap: 6 (ref 5–15)
BUN: 16 mg/dL (ref 6–20)
CO2: 22 mmol/L (ref 22–32)
Calcium: 8.8 mg/dL — ABNORMAL LOW (ref 8.9–10.3)
Chloride: 104 mmol/L (ref 98–111)
Creatinine: 0.98 mg/dL (ref 0.61–1.24)
GFR, Estimated: >60 mL/min (ref >60)
Glucose, Bld: 94 mg/dL (ref 70–99)
Potassium: 3.5 mmol/L (ref 3.5–5.1)
Sodium: 132 mmol/L — ABNORMAL LOW (ref 135–145)
Total Bilirubin: 1.1 mg/dL (ref 0.3–1.2)
Total Protein: 6.9 g/dL (ref 6.5–8.1)

## 2022-10-28 LAB — MAGNESIUM: Magnesium: 2 mg/dL (ref 1.7–2.4)

## 2022-10-28 MED ORDER — ACYCLOVIR 400 MG PO TABS: 400.0000 mg | ORAL_TABLET | Freq: Two times a day (BID) | ORAL | 6 refills | Status: DC

## 2022-10-28 NOTE — Progress Notes (Signed)
Survivorship Care Plan visit completed.  Treatment summary reviewed and given to patient.  ASCO answers booklet reviewed and given to patient.  CARE program and Cancer Transitions discussed with patient along with other resources cancer center offers to patients and caregivers.  Patient verbalized understanding.    

## 2022-10-28 NOTE — Progress Notes (Signed)
Hematology/Oncology Progress note Telephone:(336) [phone] Fax:(336) [phone]     CHIEF COMPLAINTS/PURPOSE OF CONSULTATION:  Multiple myeloma  ASSESSMENT & PLAN:   Cancer Staging  Multiple myeloma in remission [facility]) Staging form: Plasma Cell Myeloma and Plasma Cell Disorders, AJCC 8th Edition - Clinical stage from 08/20/2021: RISS Stage II (Beta-2-microglobulin (mg/L): 3.6, Albumin (g/dL): 2.2, ISS: Stage II, High-risk cytogenetics: Absent, LDH: Normal) - Signed by Rickard Patience, MD on 10/14/2021   Multiple myeloma in remission (HCC) # IgG lamda multiple myeloma, 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 - in remission.  Post transplant PET scan showed no hypermetabolic activity Labs are reviewed and discussed with patient. Counts are stable. Tolerate treatment well.  Recommend patient to continue Revlimid 10mg  daily  Recommend Aspirin 81mg  daily  Acyclovir daily for 1 year post transplant.   Hypokalemia Recommend potassium supplementation. K has improved.    Encounter for antineoplastic chemotherapy Chemotherapy plan as listed above.   Metastatic cancer to bone [facility]) Not cleared by dentist to start bisphosphonate. He has dental work that needs to be done first.  PET showed no active disease.     Normocytic anemia Stable monitor.   Follow-up in 4 weeks All questions were answered. The patient knows to call the clinic with any problems, questions or concerns. No barriers to learning was detected.  Rickard Patience, MD 10/28/2022    HISTORY OF PRESENTING ILLNESS:  Tanner [name] 37 y.o. male presents  for follow up of Multiple myeloma I have reviewed his chart and materials related to his cancer extensively and collaborated history with the patient. Summary of oncologic history is as follows: Oncology History  Multiple myeloma in remission (HCC)  08/14/2021 Imaging   CT chest abdomen  pelvis without contrast Showed numerous lucencies throughout the bone structures, most pronounced throughout the spine and left left iliac bone.  Otherwise no acute findings in the chest abdomen or pelvis.  Sigmoid diverticulosis.   08/19/2021 Initial Diagnosis   Multiple myeloma not having achieved remission, stage II  -08/15/2021, bone marrow biopsy showed plasma cell myeloma involving greater than 80% of the marrow. Cytogenetics Myeloma  FISH   08/20/2021 Cancer Staging   Staging form: Plasma Cell Myeloma and Plasma Cell Disorders, AJCC 8th Edition - Clinical stage from 08/20/2021: RISS Stage II (Beta-2-microglobulin (mg/L): 3.6, Albumin (g/dL): 2.2, ISS: Stage II, High-risk cytogenetics: Absent, LDH: Normal) - Signed by Rickard Patience, MD on 10/14/2021 Beta 2 microglobulin range (mg/L): 3.5 to 5.49 Albumin range (g/dL): Less than 3.5 Cytogenetics: No abnormalities   08/20/2021 -  Chemotherapy   MYELOMA NEWLY DIAGNOSED TRANSPLANT CANDIDATE DaraVRd (Daratumumab SUBQ) q21d x 6 Cycles (Induction/Consolidation)      09/11/2021 Imaging   PET scan showed  1. Extensive multifocal hypermetabolic disease throughout the axial and appendicular skeleton, with numerous lucent osseous lesions,consistent with patient's known diagnosis of multiple myeloma. 2. No tracer avid lymph node or mass identified to suggest soft tissue plasmacytoma   12/18/2021 Imaging   MRI lumbar 1. Multiple scattered enhancing marrow replacing bone lesions within the imaged spine and pelvis consistent with known history of multiple myeloma. No pathologic fracture or extraosseous soft tissue component. 2. Unchanged mild multilevel degenerative changes of the lumbar spine resulting in mild canal stenosis at L2-L3 and L3-L4.  3. Left greater than right subarticular recess stenosis and mild left foraminal stenosis at L5-S1.      02/25/2022 Imaging   PET  1. Decreased FDG  avidity associated with the multifocal hypermetabolic osseous  lesions, consistent with treatment response. 2. Unchanged pathologic fracture of the T8 superior endplate. 3. Prominent bilateral cervical lymph nodes are similar in size to prior but now demonstrate mildly metabolic activity, nonspecific but favored to be reactive. Attention on follow-up imaging suggested 4. Hypermetabolic hyperplasia of the tonsils, also favored reactive.   05/22/2022 Bone Marrow Transplant   Patient underwent autologous stem cell transplant. 05/30/2022, neutropenic fever.   06/03/2022 engraftment 06/04/2022, G-CSF stopped.    08/21/2022 Imaging   PET  No evidence of hypermetabolic myeloma.      INTERVAL HISTORY Tanner [name] is a 37 y.o. male who has above history reviewed by me today presents for follow up visit for management of multiple myeloma Patient is status post autologous stem cell transplant.   He is doing well.he takes Revlimid 10mg  daily. He tolerates well. He denies fever, chills, nausea vomiting diarrhea.  MEDICAL HISTORY:  Past Medical History:  Diagnosis Date   Alcohol use    Sciatica    Smoking     SURGICAL HISTORY: Past Surgical History:  Procedure Laterality Date   INGUINAL HERNIA REPAIR      SOCIAL HISTORY: Social History   Socioeconomic History   Marital status: Single    Spouse name: Not on file   Number of children: Not on file   Years of education: Not on file   Highest education level: Not on file  Occupational History   Not on file  Tobacco Use   Smoking status: Former    Current packs/day: 0.00    Average packs/day: 0.5 packs/day for 10.0 years (5.0 ttl pk-yrs)    Types: E-cigarettes, Cigarettes    Start date: 08/12/2011    Quit date: 08/11/2021    Years since quitting: 1.2   Smokeless tobacco: Never  Vaping Use   Vaping status: Every Day  Substance and Sexual Activity   Alcohol use: Not Currently    Alcohol/week: 24.0 standard drinks of alcohol    Types: 24 Cans of beer per week    Comment: quit  drinking approx 08/11/2021   Drug use: Never   Sexual activity: Not on file  Other Topics Concern   Not on file  Social History Narrative   Not on file   Social Determinants of Health   Financial Resource Strain: Low Risk  (05/30/2022)   Received from [facility], [facility]   Overall Financial Resource Strain (CARDIA)    Difficulty of Paying Living Expenses: Not hard at all  Food Insecurity: No Food Insecurity (05/30/2022)   Received from [facility], [facility]   Hunger Vital Sign    Worried About Running Out of Food in the Last Year: Never true    Ran Out of Food in the Last Year: Never true  Transportation Needs: No Transportation Needs (06/01/2022)   Received from [facility], [facility]   [facility] - Transportation    In the past 12 months, has lack of transportation kept you from medical appointments or from getting medications?: No    Lack of Transportation (Non-Medical): No  Physical Activity: Not on file  Stress: Stress Concern Present (01/13/2022)   Received from [facility], [facility]   [employer] of Occupational Health - Occupational Stress Questionnaire    Feeling of Stress : To some extent  Social Connections: Not on file  Intimate Partner Violence: Not  on file    FAMILY HISTORY: Family History  Problem Relation Age of Onset   Ovarian cancer Mother    Asthma Mother    Alcoholism Father    Throat cancer Maternal Uncle    Heart disease Maternal Uncle    Lung cancer Maternal Uncle    Lymphoma Maternal Uncle    Lung cancer Paternal Grandmother     ALLERGIES:  has No Known Allergies.  MEDICATIONS:  Current Outpatient Medications  Medication Sig Dispense Refill   potassium chloride SA (KLOR-CON M20) 20 MEQ tablet Take 1 tablet (20 mEq total) by mouth daily. 90 tablet 0   REVLIMID 10 MG capsule TAKE 1 CAPSULE BY  MOUTH 1 TIME A DAY 28 capsule 0   acyclovir (ZOVIRAX) 400 MG tablet Take 1 tablet (400 mg total) by mouth 2 (two) times daily. 60 tablet 6   aspirin EC 81 MG tablet Take 81 mg by mouth daily. Swallow whole. (Patient not taking: Reported on 06/15/2022)     magnesium chloride (SLOW-MAG) 64 MG TBEC SR tablet Take 1 tablet (64 mg total) by mouth daily. (Patient not taking: Reported on 10/28/2022) 30 tablet 0   ondansetron (ZOFRAN) 8 MG tablet Take 1 tablet (8 mg total) by mouth 2 (two) times daily as needed for nausea or vomiting. (Patient not taking: Reported on 09/01/2022) 20 tablet 2   senna-docusate (SENOKOT-S) 8.6-50 MG tablet Take 1 tablet by mouth daily. (Patient not taking: Reported on 06/15/2022) 60 tablet 2   No current facility-administered medications for this visit.    Review of Systems  Constitutional:  Positive for fatigue. Negative for appetite change, chills, fever and unexpected weight change.  HENT:   Negative for hearing loss and voice change.   Eyes:  Negative for eye problems and icterus.  Respiratory:  Negative for chest tightness, cough and shortness of breath.   Cardiovascular:  Negative for chest pain and leg swelling.  Gastrointestinal:  Negative for abdominal distention and abdominal pain.  Endocrine: Negative for hot flashes.  Genitourinary:  Negative for difficulty urinating, dysuria and frequency.   Musculoskeletal:  Negative for arthralgias and back pain.  Skin:  Negative for itching and rash.  Neurological:  Negative for light-headedness and numbness.  Hematological:  Negative for adenopathy. Does not bruise/bleed easily.  Psychiatric/Behavioral:  Negative for confusion.      PHYSICAL EXAMINATION: ECOG PERFORMANCE STATUS: 1 - Symptomatic but completely ambulatory  Vitals:   10/28/22 1108  BP: 124/81  Pulse: 79  Resp: 18  Temp: (!) 97.5 F (36.4 C)  SpO2: 100%   Filed Weights   10/28/22 1108  Weight: 228 lb 3.2 oz (103.5 kg)    Physical  Exam Constitutional:      General: He is not in acute distress.    Appearance: He is obese. He is not diaphoretic.  HENT:     Head: Normocephalic and atraumatic.     Mouth/Throat:     Pharynx: No oropharyngeal exudate.  Eyes:     General: No scleral icterus. Cardiovascular:     Rate and Rhythm: Normal rate.     Heart sounds: No murmur heard. Pulmonary:     Effort: Pulmonary effort is normal. No respiratory distress.  Abdominal:     General: There is no distension.     Palpations: Abdomen is soft.  Musculoskeletal:        General: Normal range of motion.     Cervical back: Normal range of motion.  Skin:    General: Skin  is warm and dry.     Findings: No erythema.  Neurological:     Mental Status: He is alert and oriented to person, place, and time. Mental status is at baseline.     Cranial Nerves: No cranial nerve deficit.     Motor: No abnormal muscle tone.  Psychiatric:        Mood and Affect: Mood and affect normal.      LABORATORY DATA:  I have reviewed the data as listed    Latest Ref Rng & Units 10/28/2022   10:32 AM 09/29/2022   10:34 AM 09/01/2022   10:10 AM  CBC  WBC 4.0 - 10.5 K/uL 4.0  4.3  3.7   Hemoglobin 13.0 - 17.0 g/dL 60.1  09.3  23.5   Hematocrit 39.0 - 52.0 % 33.6  36.1  33.9   Platelets 150 - 400 K/uL 214  212  217       Latest Ref Rng & Units 10/28/2022   10:32 AM 09/29/2022   10:35 AM 09/01/2022   10:10 AM  CMP  Glucose 70 - 99 mg/dL 94  92  98   BUN 6 - 20 mg/dL 16  14  12    Creatinine 0.61 - 1.24 mg/dL 5.73  2.20  2.54   Sodium 135 - 145 mmol/L 132  138  135   Potassium 3.5 - 5.1 mmol/L 3.5  4.0  3.6   Chloride 98 - 111 mmol/L 104  107  105   CO2 22 - 32 mmol/L 22  24  22    Calcium 8.9 - 10.3 mg/dL 8.8  9.2  9.1   Total Protein 6.5 - 8.1 g/dL 6.9  6.8  6.8   Total Bilirubin 0.3 - 1.2 mg/dL 1.1  0.8  0.9   Alkaline Phos 38 - 126 U/L 62  68  69   AST 15 - 41 U/L 23  18  19    ALT 0 - 44 U/L 20  15  16         RADIOGRAPHIC  STUDIES: I have personally reviewed the radiological images as listed and agreed with the findings in the report. No results found.

## 2022-10-28 NOTE — Assessment & Plan Note (Signed)
Stable  monitor

## 2022-10-28 NOTE — Assessment & Plan Note (Addendum)
#   IgG lamda multiple myeloma, status post autologous marrow transplant on 05/22/2022 08/04/2022 Bone marrow biopsy done at [facility] showed no plasma cell. - in remission.  Post transplant PET scan showed no hypermetabolic activity Labs are reviewed and discussed with patient. Counts are stable. Tolerate treatment well.  Recommend patient to continue Revlimid 10mg  daily  Recommend Aspirin 81mg  daily  Acyclovir daily for 1 year post transplant.

## 2022-10-28 NOTE — Assessment & Plan Note (Signed)
Recommend potassium supplementation. K has improved.

## 2022-10-28 NOTE — Assessment & Plan Note (Signed)
Chemotherapy plan as listed above 

## 2022-10-28 NOTE — Assessment & Plan Note (Signed)
Not cleared by dentist to start bisphosphonate. He has dental work that needs to be done first.  PET showed no active disease.

## 2022-10-29 ENCOUNTER — Other Ambulatory Visit: Payer: Self-pay | Admitting: Oncology

## 2022-10-29 ENCOUNTER — Other Ambulatory Visit: Payer: Self-pay

## 2022-10-29 DIAGNOSIS — C9000 Multiple myeloma not having achieved remission: Secondary | ICD-10-CM

## 2022-10-29 LAB — KAPPA/LAMBDA LIGHT CHAINS
Kappa free light chain: 4.4 mg/L (ref 3.3–19.4)
Kappa, lambda light chain ratio: 1.07 (ref 0.26–1.65)
Lambda free light chains: 4.1 mg/L — ABNORMAL LOW (ref 5.7–26.3)

## 2022-10-30 ENCOUNTER — Encounter: Payer: Self-pay | Admitting: Oncology

## 2022-11-01 LAB — MULTIPLE MYELOMA PANEL, SERUM
Albumin SerPl Elph-Mcnc: 4.1 g/dL (ref 2.9–4.4)
Albumin/Glob SerPl: 1.9 — ABNORMAL HIGH (ref 0.7–1.7)
Alpha 1: 0.2 g/dL (ref 0.0–0.4)
Alpha2 Glob SerPl Elph-Mcnc: 0.6 g/dL (ref 0.4–1.0)
B-Globulin SerPl Elph-Mcnc: 1 g/dL (ref 0.7–1.3)
Gamma Glob SerPl Elph-Mcnc: 0.5 g/dL (ref 0.4–1.8)
Globulin, Total: 2.2 g/dL (ref 2.2–3.9)
IgA: 41 mg/dL — ABNORMAL LOW (ref 90–386)
IgG (Immunoglobin G), Serum: 602 mg/dL — ABNORMAL LOW (ref 603–1613)
IgM (Immunoglobulin M), Srm: 13 mg/dL — ABNORMAL LOW (ref 20–172)
Total Protein ELP: 6.3 g/dL (ref 6.0–8.5)

## 2022-11-27 ENCOUNTER — Inpatient Hospital Stay (HOSPITAL_BASED_OUTPATIENT_CLINIC_OR_DEPARTMENT_OTHER): Payer: Commercial Managed Care - PPO | Admitting: Oncology

## 2022-11-27 ENCOUNTER — Inpatient Hospital Stay: Payer: Commercial Managed Care - PPO | Attending: Oncology

## 2022-11-27 ENCOUNTER — Encounter: Payer: Self-pay | Admitting: Oncology

## 2022-11-27 VITALS — BP 126/81 | HR 70 | Temp 96.8°F | Resp 18 | Wt 233.7 lb

## 2022-11-27 DIAGNOSIS — D649 Anemia, unspecified: Secondary | ICD-10-CM | POA: Diagnosis not present

## 2022-11-27 DIAGNOSIS — Z87891 Personal history of nicotine dependence: Secondary | ICD-10-CM | POA: Insufficient documentation

## 2022-11-27 DIAGNOSIS — C7951 Secondary malignant neoplasm of bone: Secondary | ICD-10-CM | POA: Diagnosis present

## 2022-11-27 DIAGNOSIS — E876 Hypokalemia: Secondary | ICD-10-CM

## 2022-11-27 DIAGNOSIS — C9001 Multiple myeloma in remission: Secondary | ICD-10-CM

## 2022-11-27 DIAGNOSIS — Z5111 Encounter for antineoplastic chemotherapy: Secondary | ICD-10-CM

## 2022-11-27 DIAGNOSIS — Z79899 Other long term (current) drug therapy: Secondary | ICD-10-CM | POA: Insufficient documentation

## 2022-11-27 DIAGNOSIS — Z9484 Stem cells transplant status: Secondary | ICD-10-CM | POA: Insufficient documentation

## 2022-11-27 DIAGNOSIS — C9000 Multiple myeloma not having achieved remission: Secondary | ICD-10-CM | POA: Diagnosis present

## 2022-11-27 LAB — CMP (CANCER CENTER ONLY)
ALT: 17 U/L (ref 0–44)
AST: 22 U/L (ref 15–41)
Albumin: 4 g/dL (ref 3.5–5.0)
Alkaline Phosphatase: 66 U/L (ref 38–126)
Anion gap: 13 (ref 5–15)
BUN: 13 mg/dL (ref 6–20)
CO2: 21 mmol/L — ABNORMAL LOW (ref 22–32)
Calcium: 8.7 mg/dL — ABNORMAL LOW (ref 8.9–10.3)
Chloride: 105 mmol/L (ref 98–111)
Creatinine: 0.99 mg/dL (ref 0.61–1.24)
GFR, Estimated: >60 mL/min (ref >60)
Glucose, Bld: 101 mg/dL — ABNORMAL HIGH (ref 70–99)
Potassium: 3.3 mmol/L — ABNORMAL LOW (ref 3.5–5.1)
Sodium: 139 mmol/L (ref 135–145)
Total Bilirubin: 0.9 mg/dL (ref <1.2)
Total Protein: 6.5 g/dL (ref 6.5–8.1)

## 2022-11-27 LAB — CBC WITH DIFFERENTIAL (CANCER CENTER ONLY)
Abs Immature Granulocytes: 0.02 10*3/uL (ref 0.00–0.07)
Basophils Absolute: 0 10*3/uL (ref 0.0–0.1)
Basophils Relative: 1 %
Eosinophils Absolute: 0.1 10*3/uL (ref 0.0–0.5)
Eosinophils Relative: 3 %
HCT: 32.8 % — ABNORMAL LOW (ref 39.0–52.0)
Hemoglobin: 11.2 g/dL — ABNORMAL LOW (ref 13.0–17.0)
Immature Granulocytes: 1 %
Lymphocytes Relative: 25 %
Lymphs Abs: 0.9 10*3/uL (ref 0.7–4.0)
MCH: 28.6 pg (ref 26.0–34.0)
MCHC: 34.1 g/dL (ref 30.0–36.0)
MCV: 83.9 fL (ref 80.0–100.0)
Monocytes Absolute: 0.5 10*3/uL (ref 0.1–1.0)
Monocytes Relative: 12 %
Neutro Abs: 2.3 10*3/uL (ref 1.7–7.7)
Neutrophils Relative %: 58 %
Platelet Count: 212 10*3/uL (ref 150–400)
RBC: 3.91 MIL/uL — ABNORMAL LOW (ref 4.22–5.81)
RDW: 14.4 % (ref 11.5–15.5)
WBC Count: 3.8 10*3/uL — ABNORMAL LOW (ref 4.0–10.5)
nRBC: 0 % (ref 0.0–0.2)

## 2022-11-27 LAB — MAGNESIUM: Magnesium: 1.9 mg/dL (ref 1.7–2.4)

## 2022-11-27 NOTE — Assessment & Plan Note (Signed)
Stable  monitor

## 2022-11-27 NOTE — Assessment & Plan Note (Signed)
Not cleared by dentist to start bisphosphonate. He has dental work that needs to be done first.  PET showed no active disease.

## 2022-11-27 NOTE — Progress Notes (Signed)
Hematology/Oncology Progress note Telephone:(336) [phone] Fax:(336) [phone]     CHIEF COMPLAINTS/PURPOSE OF CONSULTATION:  Multiple [name]  ASSESSMENT & PLAN:   Cancer Staging  Multiple [name] in remission [facility]) Staging form: Plasma Cell [name] and Plasma Cell Disorders, AJCC 8th Edition - Clinical stage from 08/20/2021: RISS Stage II (Beta-2-microglobulin (mg/L): 3.6, Albumin (g/dL): 2.2, ISS: Stage II, High-risk cytogenetics: Absent, LDH: Normal) - Signed by Rickard Patience, MD on 10/14/2021   Multiple [name] in remission (HCC) # IgG lamda multiple [name], status post autologous marrow transplant on 05/22/2022 08/04/2022 Bone marrow biopsy done at [facility] showed no plasma cell. - in remission.  Post transplant PET scan showed no hypermetabolic activity Labs are reviewed and discussed with patient. Counts are stable. Tolerate treatment well.  Recommend patient to continue Revlimid 10mg  daily  Recommend Aspirin 81mg  daily  Acyclovir daily for 1 year post transplant.   Encounter for antineoplastic chemotherapy Chemotherapy plan as listed above.   Hypokalemia Recommend potassium supplementation.   Normocytic anemia Stable monitor.    Metastatic cancer to bone [facility]) Not cleared by dentist to start bisphosphonate. He has dental work that needs to be done first.  PET showed no active disease.     Hypomagnesemia Normal magnesium. Recommend slow mag 1 tab daily.   Follow-up in 4 weeks All questions were answered. The patient knows to call the clinic with any problems, questions or concerns. No barriers to learning was detected.  Rickard Patience, MD 11/27/2022    HISTORY OF PRESENTING ILLNESS:  Tanner [name] 37 y.o. male presents  for follow up of Multiple [name] I have reviewed his chart and materials related to his cancer extensively and collaborated history with the patient. Summary of oncologic history is as follows: Oncology History  Multiple [name] in  remission (HCC)  08/14/2021 Imaging   CT chest abdomen pelvis without contrast Showed numerous lucencies throughout the bone structures, most pronounced throughout the spine and left left iliac bone.  Otherwise no acute findings in the chest abdomen or pelvis.  Sigmoid diverticulosis.   08/19/2021 Initial Diagnosis   Multiple [name] not having achieved remission, stage II  -08/15/2021, bone marrow biopsy showed plasma cell [name] involving greater than 80% of the marrow. Cytogenetics [name]  FISH   08/20/2021 Cancer Staging   Staging form: Plasma Cell [name] and Plasma Cell Disorders, AJCC 8th Edition - Clinical stage from 08/20/2021: RISS Stage II (Beta-2-microglobulin (mg/L): 3.6, Albumin (g/dL): 2.2, ISS: Stage II, High-risk cytogenetics: Absent, LDH: Normal) - Signed by Rickard Patience, MD on 10/14/2021 Beta 2 microglobulin range (mg/L): 3.5 to 5.49 Albumin range (g/dL): Less than 3.5 Cytogenetics: No abnormalities   08/20/2021 -  Chemotherapy   [name] NEWLY DIAGNOSED TRANSPLANT CANDIDATE DaraVRd (Daratumumab SUBQ) q21d x 6 Cycles (Induction/Consolidation)      09/11/2021 Imaging   PET scan showed  1. Extensive multifocal hypermetabolic disease throughout the axial and appendicular skeleton, with numerous lucent osseous lesions,consistent with patient's known diagnosis of multiple [name]. 2. No tracer avid lymph node or mass identified to suggest soft tissue plasmacytoma   12/18/2021 Imaging   MRI lumbar 1. Multiple scattered enhancing marrow replacing bone lesions within the imaged spine and pelvis consistent with known history of multiple [name]. No pathologic fracture or extraosseous soft tissue component. 2. Unchanged mild multilevel degenerative changes of the lumbar spine resulting in mild canal stenosis at L2-L3 and L3-L4.  3. Left greater than right subarticular recess stenosis and mild left foraminal stenosis at L5-S1.      02/25/2022  Imaging   PET  1. Decreased FDG avidity  associated with the multifocal hypermetabolic osseous lesions, consistent with treatment response. 2. Unchanged pathologic fracture of the T8 superior endplate. 3. Prominent bilateral cervical lymph nodes are similar in size to prior but now demonstrate mildly metabolic activity, nonspecific but favored to be reactive. Attention on follow-up imaging suggested 4. Hypermetabolic hyperplasia of the tonsils, also favored reactive.   05/22/2022 Bone Marrow Transplant   Patient underwent autologous stem cell transplant. 05/30/2022, neutropenic fever.   06/03/2022 engraftment 06/04/2022, G-CSF stopped.    08/21/2022 Imaging   PET  No evidence of hypermetabolic [name].      INTERVAL HISTORY Tanner [name] is a 37 y.o. male who has above history reviewed by me today presents for follow up visit for management of multiple [name] Patient is status post autologous stem cell transplant.   He is doing well.he takes Revlimid 10mg  daily. He tolerates well. He denies fever, chills, nausea vomiting diarrhea.  MEDICAL HISTORY:  Past Medical History:  Diagnosis Date   Alcohol use    Sciatica    Smoking     SURGICAL HISTORY: Past Surgical History:  Procedure Laterality Date   INGUINAL HERNIA REPAIR      SOCIAL HISTORY: Social History   Socioeconomic History   Marital status: Single    Spouse name: Not on file   Number of children: Not on file   Years of education: Not on file   Highest education level: Not on file  Occupational History   Not on file  Tobacco Use   Smoking status: Former    Current packs/day: 0.00    Average packs/day: 0.5 packs/day for 10.0 years (5.0 ttl pk-yrs)    Types: E-cigarettes, Cigarettes    Start date: 08/12/2011    Quit date: 08/11/2021    Years since quitting: 1.2   Smokeless tobacco: Never  Vaping Use   Vaping status: Every Day  Substance and Sexual Activity   Alcohol use: Not Currently    Alcohol/week: 24.0 standard drinks of alcohol     Types: 24 Cans of beer per week    Comment: quit drinking approx 08/11/2021   Drug use: Never   Sexual activity: Not on file  Other Topics Concern   Not on file  Social History Narrative   Not on file   Social Determinants of Health   Financial Resource Strain: Low Risk  (05/30/2022)   Received from [facility], [facility]   Overall Financial Resource Strain (CARDIA)    Difficulty of Paying Living Expenses: Not hard at all  Food Insecurity: No Food Insecurity (05/30/2022)   Received from [facility], [facility]   Hunger Vital Sign    Worried About Running Out of Food in the Last Year: Never true    Ran Out of Food in the Last Year: Never true  Transportation Needs: No Transportation Needs (06/01/2022)   Received from [facility], [facility]   [facility] - Transportation    In the past 12 months, has lack of transportation kept you from medical appointments or from getting medications?: No    Lack of Transportation (Non-Medical): No  Physical Activity: Not on file  Stress: Stress Concern Present (01/13/2022)   Received from [facility], [facility]   [employer] of Occupational Health - Occupational Stress Questionnaire    Feeling of Stress : To some extent  Social Connections:  Not on file  Intimate Partner Violence: Not on file    FAMILY HISTORY: Family History  Problem Relation Age of Onset   Ovarian cancer Mother    Asthma Mother    Alcoholism Father    Throat cancer Maternal Uncle    Heart disease Maternal Uncle    Lung cancer Maternal Uncle    Lymphoma Maternal Uncle    Lung cancer Paternal Grandmother     ALLERGIES:  has No Known Allergies.  MEDICATIONS:  Current Outpatient Medications  Medication Sig Dispense Refill   acyclovir (ZOVIRAX) 400 MG tablet Take 1 tablet (400 mg total) by mouth 2 (two) times daily. 60 tablet  6   aspirin EC 81 MG tablet Take 81 mg by mouth daily. Swallow whole.     magnesium chloride (SLOW-MAG) 64 MG TBEC SR tablet Take 1 tablet (64 mg total) by mouth daily. 30 tablet 0   ondansetron (ZOFRAN) 8 MG tablet Take 1 tablet (8 mg total) by mouth 2 (two) times daily as needed for nausea or vomiting. 20 tablet 2   potassium chloride SA (KLOR-CON M20) 20 MEQ tablet Take 1 tablet (20 mEq total) by mouth daily. 90 tablet 0   REVLIMID 10 MG capsule TAKE 1 CAPSULE BY MOUTH 1 TIME A DAY 28 capsule 0   senna-docusate (SENOKOT-S) 8.6-50 MG tablet Take 1 tablet by mouth daily. 60 tablet 2   No current facility-administered medications for this visit.    Review of Systems  Constitutional:  Positive for fatigue. Negative for appetite change, chills, fever and unexpected weight change.  HENT:   Negative for hearing loss and voice change.   Eyes:  Negative for eye problems and icterus.  Respiratory:  Negative for chest tightness, cough and shortness of breath.   Cardiovascular:  Negative for chest pain and leg swelling.  Gastrointestinal:  Negative for abdominal distention and abdominal pain.  Endocrine: Negative for hot flashes.  Genitourinary:  Negative for difficulty urinating, dysuria and frequency.   Musculoskeletal:  Negative for arthralgias and back pain.  Skin:  Negative for itching and rash.  Neurological:  Negative for light-headedness and numbness.  Hematological:  Negative for adenopathy. Does not bruise/bleed easily.  Psychiatric/Behavioral:  Negative for confusion.      PHYSICAL EXAMINATION: ECOG PERFORMANCE STATUS: 1 - Symptomatic but completely ambulatory  Vitals:   11/27/22 1044  BP: 126/81  Pulse: 70  Resp: 18  Temp: (!) 96.8 F (36 C)   Filed Weights   11/27/22 1044  Weight: 233 lb 11.2 oz (106 kg)    Physical Exam Constitutional:      General: He is not in acute distress.    Appearance: He is obese. He is not diaphoretic.  HENT:     Head: Normocephalic and  atraumatic.     Mouth/Throat:     Pharynx: No oropharyngeal exudate.  Eyes:     General: No scleral icterus. Cardiovascular:     Rate and Rhythm: Normal rate.     Heart sounds: No murmur heard. Pulmonary:     Effort: Pulmonary effort is normal. No respiratory distress.  Abdominal:     General: There is no distension.     Palpations: Abdomen is soft.  Musculoskeletal:        General: Normal range of motion.     Cervical back: Normal range of motion.  Skin:    General: Skin is warm and dry.     Findings: No erythema.  Neurological:     Mental Status:  He is alert and oriented to person, place, and time. Mental status is at baseline.     Cranial Nerves: No cranial nerve deficit.     Motor: No abnormal muscle tone.  Psychiatric:        Mood and Affect: Mood and affect normal.      LABORATORY DATA:  I have reviewed the data as listed    Latest Ref Rng & Units 11/27/2022   10:30 AM 10/28/2022   10:32 AM 09/29/2022   10:34 AM  CBC  WBC 4.0 - 10.5 K/uL 3.8  4.0  4.3   Hemoglobin 13.0 - 17.0 g/dL 08.6  57.8  46.9   Hematocrit 39.0 - 52.0 % 32.8  33.6  36.1   Platelets 150 - 400 K/uL 212  214  212       Latest Ref Rng & Units 11/27/2022   10:31 AM 10/28/2022   10:32 AM 09/29/2022   10:35 AM  CMP  Glucose 70 - 99 mg/dL 629  94  92   BUN 6 - 20 mg/dL 13  16  14    Creatinine 0.61 - 1.24 mg/dL 5.28  4.13  2.44   Sodium 135 - 145 mmol/L 139  132  138   Potassium 3.5 - 5.1 mmol/L 3.3  3.5  4.0   Chloride 98 - 111 mmol/L 105  104  107   CO2 22 - 32 mmol/L 21  22  24    Calcium 8.9 - 10.3 mg/dL 8.7  8.8  9.2   Total Protein 6.5 - 8.1 g/dL 6.5  6.9  6.8   Total Bilirubin <1.2 mg/dL 0.9  1.1  0.8   Alkaline Phos 38 - 126 U/L 66  62  68   AST 15 - 41 U/L 22  23  18    ALT 0 - 44 U/L 17  20  15         RADIOGRAPHIC STUDIES: I have personally reviewed the radiological images as listed and agreed with the findings in the report. No results found.

## 2022-11-27 NOTE — Assessment & Plan Note (Signed)
Normal magnesium. Recommend slow mag 1 tab daily.

## 2022-11-27 NOTE — Assessment & Plan Note (Signed)
#  IgG lamda multiple myeloma, status post autologous marrow transplant on 05/22/2022 08/04/2022 Bone marrow biopsy done at [facility] showed no plasma cell. - in remission.  Post transplant PET scan showed no hypermetabolic activity Labs are reviewed and discussed with patient. Counts are stable. Tolerate treatment well.  Recommend patient to continue Revlimid 10mg  daily  Recommend Aspirin 81mg  daily  Acyclovir daily for 1 year post transplant.

## 2022-11-27 NOTE — Assessment & Plan Note (Signed)
Chemotherapy plan as listed above 

## 2022-11-27 NOTE — Assessment & Plan Note (Signed)
Recommend potassium supplementation.

## 2022-11-28 ENCOUNTER — Other Ambulatory Visit: Payer: Self-pay

## 2022-11-29 LAB — KAPPA/LAMBDA LIGHT CHAINS
Kappa free light chain: 8.8 mg/L (ref 3.3–19.4)
Kappa, lambda light chain ratio: 1.31 (ref 0.26–1.65)
Lambda free light chains: 6.7 mg/L (ref 5.7–26.3)

## 2022-12-02 ENCOUNTER — Other Ambulatory Visit: Payer: Self-pay

## 2022-12-02 ENCOUNTER — Telehealth: Payer: Self-pay | Admitting: *Deleted

## 2022-12-02 DIAGNOSIS — C9000 Multiple myeloma not having achieved remission: Secondary | ICD-10-CM

## 2022-12-02 MED ORDER — REVLIMID 10 MG PO CAPS: 10.0000 mg | ORAL_CAPSULE | Freq: Every day | ORAL | 0 refills | Status: DC

## 2022-12-02 NOTE — Telephone Encounter (Signed)
Pharmacy requesting authorization number for [employer] (sp) for Revlimid.

## 2022-12-02 NOTE — Telephone Encounter (Signed)
New Rx with [facility] # sent.

## 2022-12-06 LAB — MULTIPLE MYELOMA PANEL, SERUM
Albumin SerPl Elph-Mcnc: 3.8 g/dL (ref 2.9–4.4)
Albumin/Glob SerPl: 1.9 — ABNORMAL HIGH (ref 0.7–1.7)
Alpha 1: 0.2 g/dL (ref 0.0–0.4)
Alpha2 Glob SerPl Elph-Mcnc: 0.6 g/dL (ref 0.4–1.0)
B-Globulin SerPl Elph-Mcnc: 1 g/dL (ref 0.7–1.3)
Gamma Glob SerPl Elph-Mcnc: 0.4 g/dL (ref 0.4–1.8)
Globulin, Total: 2.1 g/dL — ABNORMAL LOW (ref 2.2–3.9)
IgA: 46 mg/dL — ABNORMAL LOW (ref 90–386)
IgG (Immunoglobin G), Serum: 608 mg/dL (ref 603–1613)
IgM (Immunoglobulin M), Srm: 15 mg/dL — ABNORMAL LOW (ref 20–172)
Total Protein ELP: 5.9 g/dL — ABNORMAL LOW (ref 6.0–8.5)

## 2022-12-25 ENCOUNTER — Inpatient Hospital Stay: Payer: Commercial Managed Care - PPO | Attending: Oncology

## 2022-12-25 ENCOUNTER — Inpatient Hospital Stay (HOSPITAL_BASED_OUTPATIENT_CLINIC_OR_DEPARTMENT_OTHER): Payer: Commercial Managed Care - PPO | Admitting: Oncology

## 2022-12-25 ENCOUNTER — Encounter: Payer: Self-pay | Admitting: Oncology

## 2022-12-25 VITALS — BP 131/86 | HR 99 | Temp 98.1°F | Resp 18

## 2022-12-25 DIAGNOSIS — Z79899 Other long term (current) drug therapy: Secondary | ICD-10-CM | POA: Diagnosis not present

## 2022-12-25 DIAGNOSIS — C9001 Multiple myeloma in remission: Secondary | ICD-10-CM

## 2022-12-25 DIAGNOSIS — C9000 Multiple myeloma not having achieved remission: Secondary | ICD-10-CM | POA: Diagnosis present

## 2022-12-25 DIAGNOSIS — C7951 Secondary malignant neoplasm of bone: Secondary | ICD-10-CM | POA: Insufficient documentation

## 2022-12-25 DIAGNOSIS — D649 Anemia, unspecified: Secondary | ICD-10-CM | POA: Diagnosis not present

## 2022-12-25 DIAGNOSIS — E876 Hypokalemia: Secondary | ICD-10-CM | POA: Insufficient documentation

## 2022-12-25 DIAGNOSIS — J069 Acute upper respiratory infection, unspecified: Secondary | ICD-10-CM

## 2022-12-25 DIAGNOSIS — R0981 Nasal congestion: Secondary | ICD-10-CM | POA: Diagnosis not present

## 2022-12-25 LAB — RESPIRATORY PANEL BY PCR

## 2022-12-25 LAB — CMP (CANCER CENTER ONLY)
ALT: 16 U/L (ref 0–44)
AST: 18 U/L (ref 15–41)
Albumin: 3.7 g/dL (ref 3.5–5.0)
Alkaline Phosphatase: 82 U/L (ref 38–126)
Anion gap: 9 (ref 5–15)
BUN: 7 mg/dL (ref 6–20)
CO2: 25 mmol/L (ref 22–32)
Calcium: 8.7 mg/dL — ABNORMAL LOW (ref 8.9–10.3)
Chloride: 104 mmol/L (ref 98–111)
Creatinine: 0.92 mg/dL (ref 0.61–1.24)
GFR, Estimated: >60 mL/min (ref >60)
Glucose, Bld: 96 mg/dL (ref 70–99)
Potassium: 3.7 mmol/L (ref 3.5–5.1)
Sodium: 138 mmol/L (ref 135–145)
Total Bilirubin: 0.7 mg/dL (ref <1.2)
Total Protein: 7 g/dL (ref 6.5–8.1)

## 2022-12-25 LAB — CBC WITH DIFFERENTIAL (CANCER CENTER ONLY)
Abs Immature Granulocytes: 0.03 10*3/uL (ref 0.00–0.07)
Basophils Absolute: 0 10*3/uL (ref 0.0–0.1)
Basophils Relative: 0 %
Eosinophils Absolute: 0.1 10*3/uL (ref 0.0–0.5)
Eosinophils Relative: 2 %
HCT: 34.1 % — ABNORMAL LOW (ref 39.0–52.0)
Hemoglobin: 11.4 g/dL — ABNORMAL LOW (ref 13.0–17.0)
Immature Granulocytes: 1 %
Lymphocytes Relative: 16 %
Lymphs Abs: 0.8 10*3/uL (ref 0.7–4.0)
MCH: 28.4 pg (ref 26.0–34.0)
MCHC: 33.4 g/dL (ref 30.0–36.0)
MCV: 84.8 fL (ref 80.0–100.0)
Monocytes Absolute: 0.7 10*3/uL (ref 0.1–1.0)
Monocytes Relative: 14 %
Neutro Abs: 3.2 10*3/uL (ref 1.7–7.7)
Neutrophils Relative %: 67 %
Platelet Count: 222 10*3/uL (ref 150–400)
RBC: 4.02 MIL/uL — ABNORMAL LOW (ref 4.22–5.81)
RDW: 14.2 % (ref 11.5–15.5)
WBC Count: 4.8 10*3/uL (ref 4.0–10.5)
nRBC: 0 % (ref 0.0–0.2)

## 2022-12-25 NOTE — Assessment & Plan Note (Signed)
Stable  monitor

## 2022-12-25 NOTE — Assessment & Plan Note (Addendum)
#   IgG lamda multiple myeloma, status post autologous marrow transplant on 05/22/2022 08/04/2022 Bone marrow biopsy done at [facility] showed no plasma cell. - in remission.  Post transplant PET scan showed no hypermetabolic activity Labs are reviewed and discussed with patient. Counts are stable. Tolerate treatment well.  Recommend patient to hold Revlimid 10mg  daily for 1 week due to URI. He may resume if symptoms resolves. Recommend Aspirin 81mg  daily  Acyclovir daily for 1 year post transplant.

## 2022-12-25 NOTE — Assessment & Plan Note (Addendum)
continue potassium supplementation.

## 2022-12-25 NOTE — Assessment & Plan Note (Signed)
Not cleared by dentist to start bisphosphonate. He has dental work that needs to be finished before bisphosphonate treatments 08/21/22 PET showed no active disease.

## 2022-12-25 NOTE — Progress Notes (Signed)
Pt here for follow up. Reports that he has a runny nose and sore throat, no coughing

## 2022-12-25 NOTE — Progress Notes (Signed)
Hematology/Oncology Progress note [phone] Fax:([phone]) [phone]     CHIEF COMPLAINTS/PURPOSE OF CONSULTATION:  Multiple myeloma  ASSESSMENT & PLAN:   Cancer Staging  Multiple myeloma in remission [facility]) Staging form: Plasma Cell Myeloma and Plasma Cell Disorders, AJCC 8th Edition - Clinical stage from 08/20/2021: RISS Stage II (Beta-2-microglobulin (mg/L): 3.6, Albumin (g/dL): 2.2, ISS: Stage II, High-risk cytogenetics: Absent, LDH: Normal) - Signed by Rickard Patience, MD on 10/14/2021   Multiple myeloma in remission (HCC) # IgG lamda multiple myeloma, status post autologous marrow transplant on 05/22/2022 08/04/2022 Bone marrow biopsy done at [facility] showed no plasma cell. - in remission.  Post transplant PET scan showed no hypermetabolic activity Labs are reviewed and discussed with patient. Counts are stable. Tolerate treatment well.  Recommend patient to hold Revlimid 10mg  daily for 1 week due to URI. He may resume if symptoms resolves. Recommend Aspirin 81mg  daily  Acyclovir daily for 1 year post transplant.   Hypokalemia continue potassium supplementation.   Hypomagnesemia Normal magnesium. continue slow mag 1 tab daily.   Metastatic cancer to bone [facility]) Not cleared by dentist to start bisphosphonate. He has dental work that needs to be finished before bisphosphonate treatments 08/21/22 PET showed no active disease.     Normocytic anemia Stable monitor.    URI (upper respiratory infection) Check respiratory virus panel.  Supportive care  Follow-up in 4 weeks All questions were answered. The patient knows to call the clinic with any problems, questions or concerns. No barriers to learning was detected.  Rickard Patience, MD 12/25/2022    HISTORY OF PRESENTING ILLNESS:  Tanner [name] 37 y.o. male presents  for follow up of Multiple myeloma I have reviewed his chart and materials related to his cancer extensively and collaborated history with the  patient. Summary of oncologic history is as follows: Oncology History  Multiple myeloma in remission (HCC)  08/14/2021 Imaging   CT chest abdomen pelvis without contrast Showed numerous lucencies throughout the bone structures, most pronounced throughout the spine and left left iliac bone.  Otherwise no acute findings in the chest abdomen or pelvis.  Sigmoid diverticulosis.   08/19/2021 Initial Diagnosis   Multiple myeloma not having achieved remission, stage II  -08/15/2021, bone marrow biopsy showed plasma cell myeloma involving greater than 80% of the marrow. Cytogenetics Myeloma  FISH   08/20/2021 Cancer Staging   Staging form: Plasma Cell Myeloma and Plasma Cell Disorders, AJCC 8th Edition - Clinical stage from 08/20/2021: RISS Stage II (Beta-2-microglobulin (mg/L): 3.6, Albumin (g/dL): 2.2, ISS: Stage II, High-risk cytogenetics: Absent, LDH: Normal) - Signed by Rickard Patience, MD on 10/14/2021 Beta 2 microglobulin range (mg/L): 3.5 to 5.49 Albumin range (g/dL): Less than 3.5 Cytogenetics: No abnormalities   08/20/2021 -  Chemotherapy   MYELOMA NEWLY DIAGNOSED TRANSPLANT CANDIDATE DaraVRd (Daratumumab SUBQ) q21d x 6 Cycles (Induction/Consolidation)      09/11/2021 Imaging   PET scan showed  1. Extensive multifocal hypermetabolic disease throughout the axial and appendicular skeleton, with numerous lucent osseous lesions,consistent with patient's known diagnosis of multiple myeloma. 2. No tracer avid lymph node or mass identified to suggest soft tissue plasmacytoma   12/18/2021 Imaging   MRI lumbar 1. Multiple scattered enhancing marrow replacing bone lesions within the imaged spine and pelvis consistent with known history of multiple myeloma. No pathologic fracture or extraosseous soft tissue component. 2. Unchanged mild multilevel degenerative changes of the lumbar spine resulting in mild canal stenosis at L2-L3 and L3-L4.  3. Left greater than right subarticular recess  stenosis and mild  left foraminal stenosis at L5-S1.      02/25/2022 Imaging   PET  1. Decreased FDG avidity associated with the multifocal hypermetabolic osseous lesions, consistent with treatment response. 2. Unchanged pathologic fracture of the T8 superior endplate. 3. Prominent bilateral cervical lymph nodes are similar in size to prior but now demonstrate mildly metabolic activity, nonspecific but favored to be reactive. Attention on follow-up imaging suggested 4. Hypermetabolic hyperplasia of the tonsils, also favored reactive.   05/22/2022 Bone Marrow Transplant   Patient underwent autologous stem cell transplant. 05/30/2022, neutropenic fever.   06/03/2022 engraftment 06/04/2022, G-CSF stopped.    08/21/2022 Imaging   PET  No evidence of hypermetabolic myeloma.      INTERVAL HISTORY Tanner [name] is a 37 y.o. male who has above history reviewed by me today presents for follow up visit for management of multiple myeloma Patient is status post autologous stem cell transplant.   He is doing well.he takes Revlimid 10mg  daily. He tolerates well. he has a runny nose and sore throat, mild cough  He denies fever, chills, nausea vomiting diarrhea.  MEDICAL HISTORY:  Past Medical History:  Diagnosis Date   Alcohol use    Sciatica    Smoking     SURGICAL HISTORY: Past Surgical History:  Procedure Laterality Date   INGUINAL HERNIA REPAIR      SOCIAL HISTORY: Social History   Socioeconomic History   Marital status: Single    Spouse name: Not on file   Number of children: Not on file   Years of education: Not on file   Highest education level: Not on file  Occupational History   Not on file  Tobacco Use   Smoking status: Former    Current packs/day: 0.00    Average packs/day: 0.5 packs/day for 10.0 years (5.0 ttl pk-yrs)    Types: E-cigarettes, Cigarettes    Start date: 08/12/2011    Quit date: 08/11/2021    Years since quitting: 1.3   Smokeless tobacco: Never  Vaping Use    Vaping status: Every Day  Substance and Sexual Activity   Alcohol use: Not Currently    Alcohol/week: 24.0 standard drinks of alcohol    Types: 24 Cans of beer per week    Comment: quit drinking approx 08/11/2021   Drug use: Never   Sexual activity: Not on file  Other Topics Concern   Not on file  Social History Narrative   Not on file   Social Drivers of Health   Financial Resource Strain: Low Risk  (05/30/2022)   Received from [facility], [facility]   Overall Financial Resource Strain (CARDIA)    Difficulty of Paying Living Expenses: Not hard at all  Food Insecurity: No Food Insecurity (05/30/2022)   Received from [facility], [facility]   Hunger Vital Sign    Worried About Running Out of Food in the Last Year: Never true    Ran Out of Food in the Last Year: Never true  Transportation Needs: No Transportation Needs (06/01/2022)   Received from [facility], [facility]   [facility] - Transportation    In the past 12 months, has lack of transportation kept you from medical appointments or from getting medications?: No    Lack of Transportation (Non-Medical): No  Physical Activity: Not on file  Stress: Stress Concern Present (01/13/2022)   Received from [facility], [facility]  System   [employer] [facility] - Occupational Stress Questionnaire    Feeling of Stress : To some extent  Social Connections: Not on file  Intimate Partner Violence: Not on file    FAMILY HISTORY: Family History  Problem Relation Age of Onset   Ovarian cancer Mother    Asthma Mother    Alcoholism Father    Throat cancer Maternal Uncle    Heart disease Maternal Uncle    Lung cancer Maternal Uncle    Lymphoma Maternal Uncle    Lung cancer Paternal Grandmother     ALLERGIES:  has no known allergies.  MEDICATIONS:  Current Outpatient Medications   Medication Sig Dispense Refill   acyclovir (ZOVIRAX) 400 MG tablet Take 1 tablet (400 mg total) by mouth 2 (two) times daily. 60 tablet 6   aspirin EC 81 MG tablet Take 81 mg by mouth daily. Swallow whole.     magnesium chloride (SLOW-MAG) 64 MG TBEC SR tablet Take 1 tablet (64 mg total) by mouth daily. 30 tablet 0   ondansetron (ZOFRAN) 8 MG tablet Take 1 tablet (8 mg total) by mouth 2 (two) times daily as needed for nausea or vomiting. 20 tablet 2   potassium chloride SA (KLOR-CON M20) 20 MEQ tablet Take 1 tablet (20 mEq total) by mouth daily. 90 tablet 0   REVLIMID 10 MG capsule Take 1 capsule (10 mg total) by mouth daily. 28 capsule 0   senna-docusate (SENOKOT-S) 8.6-50 MG tablet Take 1 tablet by mouth daily. 60 tablet 2   No current facility-administered medications for this visit.    Review of Systems  Constitutional:  Positive for fatigue. Negative for appetite change, chills, fever and unexpected weight change.  HENT:   Positive for sore throat. Negative for hearing loss and voice change.        Nasal congestion  Eyes:  Negative for eye problems and icterus.  Respiratory:  Negative for chest tightness, cough and shortness of breath.   Cardiovascular:  Negative for chest pain and leg swelling.  Gastrointestinal:  Negative for abdominal distention and abdominal pain.  Endocrine: Negative for hot flashes.  Genitourinary:  Negative for difficulty urinating, dysuria and frequency.   Musculoskeletal:  Negative for arthralgias and back pain.  Skin:  Negative for itching and rash.  Neurological:  Negative for light-headedness and numbness.  Hematological:  Negative for adenopathy. Does not bruise/bleed easily.  Psychiatric/Behavioral:  Negative for confusion.      PHYSICAL EXAMINATION: ECOG PERFORMANCE STATUS: 1 - Symptomatic but completely ambulatory  Vitals:   12/25/22 1110  BP: 131/86  Pulse: 99  Resp: 18  Temp: 98.1 F (36.7 C)   There were no vitals filed for this  visit.   Physical Exam Constitutional:      General: He is not in acute distress.    Appearance: He is obese. He is not diaphoretic.  HENT:     Head: Normocephalic and atraumatic.     Mouth/Throat:     Pharynx: No oropharyngeal exudate.  Eyes:     General: No scleral icterus. Cardiovascular:     Rate and Rhythm: Normal rate.     Heart sounds: No murmur heard. Pulmonary:     Effort: Pulmonary effort is normal. No respiratory distress.  Abdominal:     General: There is no distension.     Palpations: Abdomen is soft.  Musculoskeletal:        General: Normal range of motion.     Cervical back: Normal  range of motion.  Skin:    General: Skin is warm and dry.     Findings: No erythema.  Neurological:     Mental Status: He is alert and oriented to person, place, and time. Mental status is at baseline.     Cranial Nerves: No cranial nerve deficit.     Motor: No abnormal muscle tone.  Psychiatric:        Mood and Affect: Mood and affect normal.      LABORATORY DATA:  I have reviewed the data as listed    Latest Ref Rng & Units 12/25/2022   10:57 AM 11/27/2022   10:30 AM 10/28/2022   10:32 AM  CBC  WBC 4.0 - 10.5 K/uL 4.8  3.8  4.0   Hemoglobin 13.0 - 17.0 g/dL 84.1  66.0  63.0   Hematocrit 39.0 - 52.0 % 34.1  32.8  33.6   Platelets 150 - 400 K/uL 222  212  214       Latest Ref Rng & Units 12/25/2022   10:58 AM 11/27/2022   10:31 AM 10/28/2022   10:32 AM  CMP  Glucose 70 - 99 mg/dL 96  160  94   BUN 6 - 20 mg/dL 7  13  16    Creatinine 0.61 - 1.24 mg/dL 1.09  3.23  5.57   Sodium 135 - 145 mmol/L 138  139  132   Potassium 3.5 - 5.1 mmol/L 3.7  3.3  3.5   Chloride 98 - 111 mmol/L 104  105  104   CO2 22 - 32 mmol/L 25  21  22    Calcium 8.9 - 10.3 mg/dL 8.7  8.7  8.8   Total Protein 6.5 - 8.1 g/dL 7.0  6.5  6.9   Total Bilirubin <1.2 mg/dL 0.7  0.9  1.1   Alkaline Phos 38 - 126 U/L 82  66  62   AST 15 - 41 U/L 18  22  23    ALT 0 - 44 U/L 16  17  20          RADIOGRAPHIC STUDIES: I have personally reviewed the radiological images as listed and agreed with the findings in the report. No results found.

## 2022-12-25 NOTE — Assessment & Plan Note (Signed)
Normal magnesium. continue slow mag 1 tab daily.

## 2022-12-25 NOTE — Assessment & Plan Note (Signed)
Check respiratory virus panel.  Supportive care

## 2022-12-27 ENCOUNTER — Other Ambulatory Visit: Payer: Self-pay

## 2022-12-28 LAB — KAPPA/LAMBDA LIGHT CHAINS
Kappa free light chain: 11.9 mg/L (ref 3.3–19.4)
Kappa, lambda light chain ratio: 1.42 (ref 0.26–1.65)
Lambda free light chains: 8.4 mg/L (ref 5.7–26.3)

## 2022-12-29 ENCOUNTER — Other Ambulatory Visit: Payer: Self-pay | Admitting: Oncology

## 2022-12-29 DIAGNOSIS — C9000 Multiple myeloma not having achieved remission: Secondary | ICD-10-CM

## 2022-12-31 ENCOUNTER — Encounter: Payer: Self-pay | Admitting: Oncology

## 2023-01-08 LAB — MULTIPLE MYELOMA PANEL, SERUM
Albumin SerPl Elph-Mcnc: 3.6 g/dL (ref 2.9–4.4)
Albumin/Glob SerPl: 1.5 (ref 0.7–1.7)
Alpha 1: 0.2 g/dL (ref 0.0–0.4)
Alpha2 Glob SerPl Elph-Mcnc: 0.7 g/dL (ref 0.4–1.0)
B-Globulin SerPl Elph-Mcnc: 1.1 g/dL (ref 0.7–1.3)
Gamma Glob SerPl Elph-Mcnc: 0.5 g/dL (ref 0.4–1.8)
Globulin, Total: 2.5 g/dL (ref 2.2–3.9)
IgA: 82 mg/dL — ABNORMAL LOW (ref 90–386)
IgG (Immunoglobin G), Serum: 773 mg/dL (ref 603–1613)
IgM (Immunoglobulin M), Srm: 41 mg/dL (ref 20–172)
Total Protein ELP: 6.1 g/dL (ref 6.0–8.5)

## 2023-01-25 ENCOUNTER — Inpatient Hospital Stay: Payer: Commercial Managed Care - PPO | Attending: Oncology

## 2023-01-25 ENCOUNTER — Encounter: Payer: Self-pay | Admitting: Oncology

## 2023-01-25 ENCOUNTER — Inpatient Hospital Stay (HOSPITAL_BASED_OUTPATIENT_CLINIC_OR_DEPARTMENT_OTHER): Payer: Commercial Managed Care - PPO | Admitting: Oncology

## 2023-01-25 VITALS — BP 152/86 | HR 90 | Temp 98.7°F | Resp 18 | Wt 246.7 lb

## 2023-01-25 DIAGNOSIS — C9000 Multiple myeloma not having achieved remission: Secondary | ICD-10-CM

## 2023-01-25 DIAGNOSIS — Z808 Family history of malignant neoplasm of other organs or systems: Secondary | ICD-10-CM | POA: Diagnosis not present

## 2023-01-25 DIAGNOSIS — D649 Anemia, unspecified: Secondary | ICD-10-CM

## 2023-01-25 DIAGNOSIS — C9001 Multiple myeloma in remission: Secondary | ICD-10-CM | POA: Insufficient documentation

## 2023-01-25 DIAGNOSIS — Z8041 Family history of malignant neoplasm of ovary: Secondary | ICD-10-CM | POA: Diagnosis not present

## 2023-01-25 DIAGNOSIS — Z79899 Other long term (current) drug therapy: Secondary | ICD-10-CM | POA: Insufficient documentation

## 2023-01-25 DIAGNOSIS — E876 Hypokalemia: Secondary | ICD-10-CM | POA: Diagnosis not present

## 2023-01-25 DIAGNOSIS — N189 Chronic kidney disease, unspecified: Secondary | ICD-10-CM

## 2023-01-25 DIAGNOSIS — C7951 Secondary malignant neoplasm of bone: Secondary | ICD-10-CM | POA: Insufficient documentation

## 2023-01-25 DIAGNOSIS — F17290 Nicotine dependence, other tobacco product, uncomplicated: Secondary | ICD-10-CM | POA: Insufficient documentation

## 2023-01-25 DIAGNOSIS — Z9484 Stem cells transplant status: Secondary | ICD-10-CM | POA: Diagnosis not present

## 2023-01-25 DIAGNOSIS — Z807 Family history of other malignant neoplasms of lymphoid, hematopoietic and related tissues: Secondary | ICD-10-CM | POA: Insufficient documentation

## 2023-01-25 DIAGNOSIS — Z801 Family history of malignant neoplasm of trachea, bronchus and lung: Secondary | ICD-10-CM | POA: Insufficient documentation

## 2023-01-25 LAB — CBC WITH DIFFERENTIAL (CANCER CENTER ONLY)
Abs Immature Granulocytes: 0.03 10*3/uL (ref 0.00–0.07)
Basophils Absolute: 0 10*3/uL (ref 0.0–0.1)
Basophils Relative: 1 %
Eosinophils Absolute: 0.2 10*3/uL (ref 0.0–0.5)
Eosinophils Relative: 4 %
HCT: 37.1 % — ABNORMAL LOW (ref 39.0–52.0)
Hemoglobin: 12.7 g/dL — ABNORMAL LOW (ref 13.0–17.0)
Immature Granulocytes: 1 %
Lymphocytes Relative: 28 %
Lymphs Abs: 1.1 10*3/uL (ref 0.7–4.0)
MCH: 28.5 pg (ref 26.0–34.0)
MCHC: 34.2 g/dL (ref 30.0–36.0)
MCV: 83.4 fL (ref 80.0–100.0)
Monocytes Absolute: 0.5 10*3/uL (ref 0.1–1.0)
Monocytes Relative: 11 %
Neutro Abs: 2.2 10*3/uL (ref 1.7–7.7)
Neutrophils Relative %: 55 %
Platelet Count: 220 10*3/uL (ref 150–400)
RBC: 4.45 MIL/uL (ref 4.22–5.81)
RDW: 14.1 % (ref 11.5–15.5)
WBC Count: 4 10*3/uL (ref 4.0–10.5)
nRBC: 0 % (ref 0.0–0.2)

## 2023-01-25 LAB — CMP (CANCER CENTER ONLY)
ALT: 19 U/L (ref 0–44)
AST: 21 U/L (ref 15–41)
Albumin: 3.9 g/dL (ref 3.5–5.0)
Alkaline Phosphatase: 78 U/L (ref 38–126)
Anion gap: 9 (ref 5–15)
BUN: 13 mg/dL (ref 6–20)
CO2: 22 mmol/L (ref 22–32)
Calcium: 8.9 mg/dL (ref 8.9–10.3)
Chloride: 105 mmol/L (ref 98–111)
Creatinine: 1.26 mg/dL — ABNORMAL HIGH (ref 0.61–1.24)
GFR, Estimated: >60 mL/min (ref >60)
Glucose, Bld: 109 mg/dL — ABNORMAL HIGH (ref 70–99)
Potassium: 3.8 mmol/L (ref 3.5–5.1)
Sodium: 136 mmol/L (ref 135–145)
Total Bilirubin: 0.5 mg/dL (ref 0.0–1.2)
Total Protein: 7 g/dL (ref 6.5–8.1)

## 2023-01-25 LAB — MAGNESIUM: Magnesium: 1.9 mg/dL (ref 1.7–2.4)

## 2023-01-25 NOTE — Assessment & Plan Note (Signed)
Elevated creatine today. Encourage oral hydration.

## 2023-01-25 NOTE — Assessment & Plan Note (Signed)
 Not cleared by dentist to start bisphosphonate. He has dental work that needs to be finished before bisphosphonate treatments 08/21/22 PET showed no active disease.

## 2023-01-25 NOTE — Assessment & Plan Note (Signed)
 Normal magnesium. continue slow mag 1 tab daily.

## 2023-01-25 NOTE — Assessment & Plan Note (Signed)
#   IgG lamda multiple myeloma, status post autologous marrow transplant on 05/22/2022 08/04/2022 Bone marrow biopsy done at [facility] showed no plasma cell. - in remission.  Post transplant PET scan showed no hypermetabolic activity Labs are reviewed and discussed with patient. Counts are stable. Tolerate treatment well.  Continue Revlimid 10mg  daily  Recommend Aspirin 81mg  daily  Acyclovir daily for 1 year post transplant.

## 2023-01-25 NOTE — Assessment & Plan Note (Signed)
Stable  monitor

## 2023-01-25 NOTE — Progress Notes (Signed)
Hematology/Oncology Progress note Telephone:(336) [phone] Fax:(336) [phone]     CHIEF COMPLAINTS/PURPOSE OF CONSULTATION:  Multiple myeloma  ASSESSMENT & PLAN:   Cancer Staging  Multiple myeloma in remission [facility]) Staging form: Plasma Cell Myeloma and Plasma Cell Disorders, AJCC 8th Edition - Clinical stage from 08/20/2021: RISS Stage II (Beta-2-microglobulin (mg/L): 3.6, Albumin (g/dL): 2.2, ISS: Stage II, High-risk cytogenetics: Absent, LDH: Normal) - Signed by Rickard Patience, MD on 10/14/2021   Multiple myeloma in remission (HCC) # IgG lamda multiple myeloma, status post autologous marrow transplant on 05/22/2022 08/04/2022 Bone marrow biopsy done at [facility] showed no plasma cell. - in remission.  Post transplant PET scan showed no hypermetabolic activity Labs are reviewed and discussed with patient. Counts are stable. Tolerate treatment well.  Continue Revlimid 10mg  daily  Recommend Aspirin 81mg  daily  Acyclovir daily for 1 year post transplant.   Hypokalemia continue potassium supplementation.   Hypomagnesemia Normal magnesium. continue slow mag 1 tab daily.   Metastatic cancer to bone [facility]) Not cleared by dentist to start bisphosphonate.  He has dental work that needs to be finished before bisphosphonate treatments 08/21/22 PET showed no active disease.     Normocytic anemia Stable monitor.    Follow-up in 4 weeks All questions were answered. The patient knows to call the clinic with any problems, questions or concerns. No barriers to learning was detected.  Rickard Patience, MD 01/25/2023    HISTORY OF PRESENTING ILLNESS:  Tanner [name] 38 y.o. male presents  for follow up of Multiple myeloma I have reviewed his chart and materials related to his cancer extensively and collaborated history with the patient. Summary of oncologic history is as follows: Oncology History  Multiple myeloma in remission (HCC)  08/14/2021 Imaging   CT chest abdomen pelvis  without contrast Showed numerous lucencies throughout the bone structures, most pronounced throughout the spine and left left iliac bone.  Otherwise no acute findings in the chest abdomen or pelvis.  Sigmoid diverticulosis.   08/19/2021 Initial Diagnosis   Multiple myeloma not having achieved remission, stage II  -08/15/2021, bone marrow biopsy showed plasma cell myeloma involving greater than 80% of the marrow. Cytogenetics Myeloma  FISH   08/20/2021 Cancer Staging   Staging form: Plasma Cell Myeloma and Plasma Cell Disorders, AJCC 8th Edition - Clinical stage from 08/20/2021: RISS Stage II (Beta-2-microglobulin (mg/L): 3.6, Albumin (g/dL): 2.2, ISS: Stage II, High-risk cytogenetics: Absent, LDH: Normal) - Signed by Rickard Patience, MD on 10/14/2021 Beta 2 microglobulin range (mg/L): 3.5 to 5.49 Albumin range (g/dL): Less than 3.5 Cytogenetics: No abnormalities   08/20/2021 -  Chemotherapy   MYELOMA NEWLY DIAGNOSED TRANSPLANT CANDIDATE DaraVRd (Daratumumab SUBQ) q21d x 6 Cycles (Induction/Consolidation)      09/11/2021 Imaging   PET scan showed  1. Extensive multifocal hypermetabolic disease throughout the axial and appendicular skeleton, with numerous lucent osseous lesions,consistent with patient's known diagnosis of multiple myeloma. 2. No tracer avid lymph node or mass identified to suggest soft tissue plasmacytoma   12/18/2021 Imaging   MRI lumbar 1. Multiple scattered enhancing marrow replacing bone lesions within the imaged spine and pelvis consistent with known history of multiple myeloma. No pathologic fracture or extraosseous soft tissue component. 2. Unchanged mild multilevel degenerative changes of the lumbar spine resulting in mild canal stenosis at L2-L3 and L3-L4.  3. Left greater than right subarticular recess stenosis and mild left foraminal stenosis at L5-S1.      02/25/2022 Imaging   PET  1. Decreased FDG avidity associated with  the multifocal hypermetabolic osseous lesions,  consistent with treatment response. 2. Unchanged pathologic fracture of the T8 superior endplate. 3. Prominent bilateral cervical lymph nodes are similar in size to prior but now demonstrate mildly metabolic activity, nonspecific but favored to be reactive. Attention on follow-up imaging suggested 4. Hypermetabolic hyperplasia of the tonsils, also favored reactive.   05/22/2022 Bone Marrow Transplant   Patient underwent autologous stem cell transplant. 05/30/2022, neutropenic fever.   06/03/2022 engraftment 06/04/2022, G-CSF stopped.    08/21/2022 Imaging   PET  No evidence of hypermetabolic myeloma.      INTERVAL HISTORY Eyas Moron is a 38 y.o. male who has above history reviewed by me today presents for follow up visit for management of multiple myeloma Patient is status post autologous stem cell transplant.   He is doing well.he takes Revlimid 10mg  daily. He tolerates well. He denies fever, chills, nausea vomiting diarrhea.  MEDICAL HISTORY:  Past Medical History:  Diagnosis Date   Alcohol use    Sciatica    Smoking     SURGICAL HISTORY: Past Surgical History:  Procedure Laterality Date   INGUINAL HERNIA REPAIR      SOCIAL HISTORY: Social History   Socioeconomic History   Marital status: Single    Spouse name: Not on file   Number of children: Not on file   Years of education: Not on file   Highest education level: Not on file  Occupational History   Not on file  Tobacco Use   Smoking status: Former    Current packs/day: 0.00    Average packs/day: 0.5 packs/day for 10.0 years (5.0 ttl pk-yrs)    Types: E-cigarettes, Cigarettes    Start date: 08/12/2011    Quit date: 08/11/2021    Years since quitting: 1.4   Smokeless tobacco: Never  Vaping Use   Vaping status: Every Day  Substance and Sexual Activity   Alcohol use: Not Currently    Alcohol/week: 24.0 standard drinks of alcohol    Types: 24 Cans of beer per week    Comment: quit drinking approx  08/11/2021   Drug use: Never   Sexual activity: Not on file  Other Topics Concern   Not on file  Social History Narrative   Not on file   Social Drivers of Health   Financial Resource Strain: Low Risk  (05/30/2022)   Received from [facility], [facility]   Overall Financial Resource Strain (CARDIA)    Difficulty of Paying Living Expenses: Not hard at all  Food Insecurity: No Food Insecurity (05/30/2022)   Received from [facility], [facility]   Hunger Vital Sign    Worried About Running Out of Food in the Last Year: Never true    Ran Out of Food in the Last Year: Never true  Transportation Needs: No Transportation Needs (06/01/2022)   Received from [facility], [facility]   [facility] - Transportation    In the past 12 months, has lack of transportation kept you from medical appointments or from getting medications?: No    Lack of Transportation (Non-Medical): No  Physical Activity: Not on file  Stress: Stress Concern Present (01/13/2022)   Received from [facility], [facility]   [employer] of Occupational Health - Occupational Stress Questionnaire    Feeling of Stress : To some extent  Social Connections: Not on file  Intimate Partner Violence: Not on file  FAMILY HISTORY: Family History  Problem Relation Age of Onset   Ovarian cancer Mother    Asthma Mother    Alcoholism Father    Throat cancer Maternal Uncle    Heart disease Maternal Uncle    Lung cancer Maternal Uncle    Lymphoma Maternal Uncle    Lung cancer Paternal Grandmother     ALLERGIES:  has no known allergies.  MEDICATIONS:  Current Outpatient Medications  Medication Sig Dispense Refill   acyclovir (ZOVIRAX) 400 MG tablet Take 1 tablet (400 mg total) by mouth 2 (two) times daily. 60 tablet 6   aspirin EC 81 MG tablet Take 81 mg by mouth daily. Swallow whole.      magnesium chloride (SLOW-MAG) 64 MG TBEC SR tablet Take 1 tablet (64 mg total) by mouth daily. 30 tablet 0   ondansetron (ZOFRAN) 8 MG tablet Take 1 tablet (8 mg total) by mouth 2 (two) times daily as needed for nausea or vomiting. 20 tablet 2   potassium chloride SA (KLOR-CON M20) 20 MEQ tablet Take 1 tablet (20 mEq total) by mouth daily. 90 tablet 0   REVLIMID 10 MG capsule TAKE 1 CAPSULE BY MOUTH 1 TIME A DAY 28 capsule 0   senna-docusate (SENOKOT-S) 8.6-50 MG tablet Take 1 tablet by mouth daily. 60 tablet 2   No current facility-administered medications for this visit.    Review of Systems  Constitutional:  Negative for appetite change, chills, fatigue, fever and unexpected weight change.  HENT:   Negative for hearing loss, sore throat and voice change.        Nasal congestion  Eyes:  Negative for eye problems and icterus.  Respiratory:  Negative for chest tightness, cough and shortness of breath.   Cardiovascular:  Negative for chest pain and leg swelling.  Gastrointestinal:  Negative for abdominal distention and abdominal pain.  Endocrine: Negative for hot flashes.  Genitourinary:  Negative for difficulty urinating, dysuria and frequency.   Musculoskeletal:  Negative for arthralgias and back pain.  Skin:  Negative for itching and rash.  Neurological:  Negative for light-headedness and numbness.  Hematological:  Negative for adenopathy. Does not bruise/bleed easily.  Psychiatric/Behavioral:  Negative for confusion.      PHYSICAL EXAMINATION: ECOG PERFORMANCE STATUS: 1 - Symptomatic but completely ambulatory  Vitals:   01/25/23 1059 01/25/23 1107  BP: (!) 149/84 (!) 152/86  Pulse: 90   Resp: 18   Temp: 98.7 F (37.1 C)   SpO2: 100%    Filed Weights   01/25/23 1059  Weight: 246 lb 11.2 oz (111.9 kg)     Physical Exam Constitutional:      General: He is not in acute distress.    Appearance: He is obese. He is not diaphoretic.  HENT:     Head: Normocephalic and  atraumatic.     Mouth/Throat:     Pharynx: No oropharyngeal exudate.  Eyes:     General: No scleral icterus. Cardiovascular:     Rate and Rhythm: Normal rate.     Heart sounds: No murmur heard. Pulmonary:     Effort: Pulmonary effort is normal. No respiratory distress.  Abdominal:     General: There is no distension.     Palpations: Abdomen is soft.  Musculoskeletal:        General: Normal range of motion.     Cervical back: Normal range of motion.  Skin:    General: Skin is warm and dry.     Findings: No erythema.  Neurological:     Mental Status: He is alert and oriented to person, place, and time. Mental status is at baseline.     Cranial Nerves: No cranial nerve deficit.     Motor: No abnormal muscle tone.  Psychiatric:        Mood and Affect: Mood and affect normal.      LABORATORY DATA:  I have reviewed the data as listed    Latest Ref Rng & Units 01/25/2023   10:51 AM 12/25/2022   10:57 AM 11/27/2022   10:30 AM  CBC  WBC 4.0 - 10.5 K/uL 4.0  4.8  3.8   Hemoglobin 13.0 - 17.0 g/dL 16.1  09.6  04.5   Hematocrit 39.0 - 52.0 % 37.1  34.1  32.8   Platelets 150 - 400 K/uL 220  222  212       Latest Ref Rng & Units 01/25/2023   10:51 AM 12/25/2022   10:58 AM 11/27/2022   10:31 AM  CMP  Glucose 70 - 99 mg/dL 409  96  811   BUN 6 - 20 mg/dL 13  7  13    Creatinine 0.61 - 1.24 mg/dL 9.14  7.82  9.56   Sodium 135 - 145 mmol/L 136  138  139   Potassium 3.5 - 5.1 mmol/L 3.8  3.7  3.3   Chloride 98 - 111 mmol/L 105  104  105   CO2 22 - 32 mmol/L 22  25  21    Calcium 8.9 - 10.3 mg/dL 8.9  8.7  8.7   Total Protein 6.5 - 8.1 g/dL 7.0  7.0  6.5   Total Bilirubin 0.0 - 1.2 mg/dL 0.5  0.7  0.9   Alkaline Phos 38 - 126 U/L 78  82  66   AST 15 - 41 U/L 21  18  22    ALT 0 - 44 U/L 19  16  17         RADIOGRAPHIC STUDIES: I have personally reviewed the radiological images as listed and agreed with the findings in the report. No results found.

## 2023-01-25 NOTE — Assessment & Plan Note (Signed)
 continue potassium supplementation.

## 2023-01-26 ENCOUNTER — Other Ambulatory Visit: Payer: Self-pay

## 2023-01-26 LAB — KAPPA/LAMBDA LIGHT CHAINS
Kappa free light chain: 11.1 mg/L (ref 3.3–19.4)
Kappa, lambda light chain ratio: 1.59 (ref 0.26–1.65)
Lambda free light chains: 7 mg/L (ref 5.7–26.3)

## 2023-01-31 LAB — MULTIPLE MYELOMA PANEL, SERUM
Albumin SerPl Elph-Mcnc: 3.8 g/dL (ref 2.9–4.4)
Albumin/Glob SerPl: 1.5 (ref 0.7–1.7)
Alpha 1: 0.2 g/dL (ref 0.0–0.4)
Alpha2 Glob SerPl Elph-Mcnc: 0.5 g/dL (ref 0.4–1.0)
B-Globulin SerPl Elph-Mcnc: 1.1 g/dL (ref 0.7–1.3)
Gamma Glob SerPl Elph-Mcnc: 0.8 g/dL (ref 0.4–1.8)
Globulin, Total: 2.6 g/dL (ref 2.2–3.9)
IgA: 107 mg/dL (ref 90–386)
IgG (Immunoglobin G), Serum: 972 mg/dL (ref 603–1613)
IgM (Immunoglobulin M), Srm: 37 mg/dL (ref 20–172)
Total Protein ELP: 6.4 g/dL (ref 6.0–8.5)

## 2023-02-22 ENCOUNTER — Telehealth: Payer: Self-pay | Admitting: Oncology

## 2023-02-22 NOTE — Telephone Encounter (Signed)
Per secure chat, wed. 02/24/23 there is possible bad weather/snow. Appts need to be r/s.  I called and left vm for pt to call us back to get these (lab/MD) r/s

## 2023-02-24 ENCOUNTER — Inpatient Hospital Stay: Payer: Commercial Managed Care - PPO | Admitting: Oncology

## 2023-02-24 ENCOUNTER — Inpatient Hospital Stay: Payer: Commercial Managed Care - PPO | Attending: Oncology

## 2023-02-24 ENCOUNTER — Other Ambulatory Visit: Payer: Self-pay

## 2023-02-27 ENCOUNTER — Other Ambulatory Visit: Payer: Self-pay

## 2023-03-02 ENCOUNTER — Inpatient Hospital Stay: Payer: Commercial Managed Care - PPO | Attending: Oncology

## 2023-03-02 ENCOUNTER — Inpatient Hospital Stay (HOSPITAL_BASED_OUTPATIENT_CLINIC_OR_DEPARTMENT_OTHER): Payer: Commercial Managed Care - PPO | Admitting: Oncology

## 2023-03-02 ENCOUNTER — Encounter: Payer: Self-pay | Admitting: Oncology

## 2023-03-02 VITALS — BP 121/77 | HR 93 | Temp 97.8°F | Resp 18 | Wt 245.7 lb

## 2023-03-02 DIAGNOSIS — C9000 Multiple myeloma not having achieved remission: Secondary | ICD-10-CM

## 2023-03-02 DIAGNOSIS — C7951 Secondary malignant neoplasm of bone: Secondary | ICD-10-CM | POA: Insufficient documentation

## 2023-03-02 DIAGNOSIS — E876 Hypokalemia: Secondary | ICD-10-CM | POA: Insufficient documentation

## 2023-03-02 DIAGNOSIS — Z5111 Encounter for antineoplastic chemotherapy: Secondary | ICD-10-CM

## 2023-03-02 DIAGNOSIS — C9001 Multiple myeloma in remission: Secondary | ICD-10-CM | POA: Diagnosis not present

## 2023-03-02 DIAGNOSIS — D649 Anemia, unspecified: Secondary | ICD-10-CM | POA: Insufficient documentation

## 2023-03-02 DIAGNOSIS — Z79899 Other long term (current) drug therapy: Secondary | ICD-10-CM | POA: Diagnosis not present

## 2023-03-02 DIAGNOSIS — Z87891 Personal history of nicotine dependence: Secondary | ICD-10-CM | POA: Insufficient documentation

## 2023-03-02 LAB — CBC WITH DIFFERENTIAL (CANCER CENTER ONLY)
Abs Immature Granulocytes: 0.03 10*3/uL (ref 0.00–0.07)
Basophils Absolute: 0 10*3/uL (ref 0.0–0.1)
Basophils Relative: 0 %
Eosinophils Absolute: 0.1 10*3/uL (ref 0.0–0.5)
Eosinophils Relative: 2 %
HCT: 35.4 % — ABNORMAL LOW (ref 39.0–52.0)
Hemoglobin: 12.1 g/dL — ABNORMAL LOW (ref 13.0–17.0)
Immature Granulocytes: 1 %
Lymphocytes Relative: 20 %
Lymphs Abs: 0.9 10*3/uL (ref 0.7–4.0)
MCH: 28.2 pg (ref 26.0–34.0)
MCHC: 34.2 g/dL (ref 30.0–36.0)
MCV: 82.5 fL (ref 80.0–100.0)
Monocytes Absolute: 0.4 10*3/uL (ref 0.1–1.0)
Monocytes Relative: 10 %
Neutro Abs: 3.1 10*3/uL (ref 1.7–7.7)
Neutrophils Relative %: 67 %
Platelet Count: 228 10*3/uL (ref 150–400)
RBC: 4.29 MIL/uL (ref 4.22–5.81)
RDW: 13.9 % (ref 11.5–15.5)
WBC Count: 4.6 10*3/uL (ref 4.0–10.5)
nRBC: 0 % (ref 0.0–0.2)

## 2023-03-02 LAB — CMP (CANCER CENTER ONLY)
ALT: 28 U/L (ref 0–44)
AST: 27 U/L (ref 15–41)
Albumin: 4.1 g/dL (ref 3.5–5.0)
Alkaline Phosphatase: 71 U/L (ref 38–126)
Anion gap: 9 (ref 5–15)
BUN: 13 mg/dL (ref 6–20)
CO2: 24 mmol/L (ref 22–32)
Calcium: 9 mg/dL (ref 8.9–10.3)
Chloride: 103 mmol/L (ref 98–111)
Creatinine: 1.08 mg/dL (ref 0.61–1.24)
GFR, Estimated: >60 mL/min (ref >60)
Glucose, Bld: 116 mg/dL — ABNORMAL HIGH (ref 70–99)
Potassium: 3.5 mmol/L (ref 3.5–5.1)
Sodium: 136 mmol/L (ref 135–145)
Total Bilirubin: 0.9 mg/dL (ref 0.0–1.2)
Total Protein: 7.4 g/dL (ref 6.5–8.1)

## 2023-03-02 LAB — MAGNESIUM: Magnesium: 2 mg/dL (ref 1.7–2.4)

## 2023-03-02 MED ORDER — POTASSIUM CHLORIDE CRYS ER 20 MEQ PO TBCR: 20.0000 meq | EXTENDED_RELEASE_TABLET | Freq: Every day | ORAL | 0 refills | Status: DC

## 2023-03-02 MED ORDER — REVLIMID 10 MG PO CAPS: 10.0000 mg | ORAL_CAPSULE | Freq: Every day | ORAL | 0 refills | Status: DC

## 2023-03-02 NOTE — Assessment & Plan Note (Signed)
 Chemotherapy plan as listed above

## 2023-03-02 NOTE — Assessment & Plan Note (Signed)
 Normal magnesium. continue slow mag 1 tab daily.

## 2023-03-02 NOTE — Progress Notes (Signed)
 Hematology/Oncology Progress note Telephone:([phone] Fax:(336) [phone]     CHIEF COMPLAINTS/PURPOSE OF CONSULTATION:  Multiple myeloma  ASSESSMENT & PLAN:   Cancer Staging  Multiple myeloma in remission [facility]) Staging form: Plasma Cell Myeloma and Plasma Cell Disorders, AJCC 8th Edition - Clinical stage from 08/20/2021: RISS Stage II (Beta-2-microglobulin (mg/L): 3.6, Albumin (g/dL): 2.2, ISS: Stage II, High-risk cytogenetics: Absent, LDH: Normal) - Signed by Rickard Patience, MD on 10/14/2021   Multiple myeloma in remission (HCC) # IgG lamda multiple myeloma, status post autologous marrow transplant on 05/22/2022 08/04/2022 Bone marrow biopsy done at [facility] showed no plasma cell. - in remission.  Post transplant PET scan showed no hypermetabolic activity Labs are reviewed and discussed with patient. Counts are stable. Tolerate treatment well.  Continue Revlimid 10mg  daily  Recommend Aspirin 81mg  daily  Acyclovir daily for 1 year post transplant.   Encounter for antineoplastic chemotherapy Chemotherapy plan as listed above.   Hypokalemia continue potassium supplementation.   Hypomagnesemia Normal magnesium. continue slow mag 1 tab daily.   Metastatic cancer to bone (HCC) 08/21/22 PET showed no active disease.  Not cleared by dentist to start bisphosphonate.  He has dental work that needs to be finished before bisphosphonate treatments     Normocytic anemia Stable monitor.    Follow-up in 4 weeks All questions were answered. The patient knows to call the clinic with any problems, questions or concerns. No barriers to learning was detected.  Rickard Patience, MD 03/02/2023    HISTORY OF PRESENTING ILLNESS:  Tanner [name] 38 y.o. male presents  for follow up of Multiple myeloma I have reviewed his chart and materials related to his cancer extensively and collaborated history with the patient. Summary of oncologic history is as follows: Oncology History   Multiple myeloma in remission (HCC)  08/14/2021 Imaging   CT chest abdomen pelvis without contrast Showed numerous lucencies throughout the bone structures, most pronounced throughout the spine and left left iliac bone.  Otherwise no acute findings in the chest abdomen or pelvis.  Sigmoid diverticulosis.   08/19/2021 Initial Diagnosis   Multiple myeloma not having achieved remission, stage II  -08/15/2021, bone marrow biopsy showed plasma cell myeloma involving greater than 80% of the marrow. Cytogenetics Myeloma  FISH   08/20/2021 Cancer Staging   Staging form: Plasma Cell Myeloma and Plasma Cell Disorders, AJCC 8th Edition - Clinical stage from 08/20/2021: RISS Stage II (Beta-2-microglobulin (mg/L): 3.6, Albumin (g/dL): 2.2, ISS: Stage II, High-risk cytogenetics: Absent, LDH: Normal) - Signed by Rickard Patience, MD on 10/14/2021 Beta 2 microglobulin range (mg/L): 3.5 to 5.49 Albumin range (g/dL): Less than 3.5 Cytogenetics: No abnormalities   08/20/2021 -  Chemotherapy   MYELOMA NEWLY DIAGNOSED TRANSPLANT CANDIDATE DaraVRd (Daratumumab SUBQ) q21d x 6 Cycles (Induction/Consolidation)      09/11/2021 Imaging   PET scan showed  1. Extensive multifocal hypermetabolic disease throughout the axial and appendicular skeleton, with numerous lucent osseous lesions,consistent with patient's known diagnosis of multiple myeloma. 2. No tracer avid lymph node or mass identified to suggest soft tissue plasmacytoma   12/18/2021 Imaging   MRI lumbar 1. Multiple scattered enhancing marrow replacing bone lesions within the imaged spine and pelvis consistent with known history of multiple myeloma. No pathologic fracture or extraosseous soft tissue component. 2. Unchanged mild multilevel degenerative changes of the lumbar spine resulting in mild canal stenosis at L2-L3 and L3-L4.  3. Left greater than right subarticular recess stenosis and mild left foraminal stenosis at L5-S1.  02/25/2022 Imaging   PET  1.  Decreased FDG avidity associated with the multifocal hypermetabolic osseous lesions, consistent with treatment response. 2. Unchanged pathologic fracture of the T8 superior endplate. 3. Prominent bilateral cervical lymph nodes are similar in size to prior but now demonstrate mildly metabolic activity, nonspecific but favored to be reactive. Attention on follow-up imaging suggested 4. Hypermetabolic hyperplasia of the tonsils, also favored reactive.   05/22/2022 Bone Marrow Transplant   Patient underwent autologous stem cell transplant. 05/30/2022, neutropenic fever.   06/03/2022 engraftment 06/04/2022, G-CSF stopped.    08/21/2022 Imaging   PET  No evidence of hypermetabolic myeloma.      INTERVAL HISTORY Tanner [name] is a 38 y.o. male who has above history reviewed by me today presents for follow up visit for management of multiple myeloma Patient is status post autologous stem cell transplant.   He is doing well.he takes Revlimid 10mg  daily. He tolerates well. He denies fever, chills, nausea vomiting diarrhea. He get immunization at [facility]. Recently he noticed an area of skin rash after he received vaccination. Symptom has improved.   MEDICAL HISTORY:  Past Medical History:  Diagnosis Date   Alcohol use    Sciatica    Smoking     SURGICAL HISTORY: Past Surgical History:  Procedure Laterality Date   INGUINAL HERNIA REPAIR      SOCIAL HISTORY: Social History   Socioeconomic History   Marital status: Single    Spouse name: Not on file   Number of children: Not on file   Years of education: Not on file   Highest education level: Not on file  Occupational History   Not on file  Tobacco Use   Smoking status: Former    Current packs/day: 0.00    Average packs/day: 0.5 packs/day for 10.0 years (5.0 ttl pk-yrs)    Types: E-cigarettes, Cigarettes    Start date: 08/12/2011    Quit date: 08/11/2021    Years since quitting: 1.5   Smokeless tobacco: Never  Vaping Use    Vaping status: Every Day  Substance and Sexual Activity   Alcohol use: Not Currently    Alcohol/week: 24.0 standard drinks of alcohol    Types: 24 Cans of beer per week    Comment: quit drinking approx 08/11/2021   Drug use: Never   Sexual activity: Not on file  Other Topics Concern   Not on file  Social History Narrative   Not on file   Social Drivers of Health   Financial Resource Strain: Low Risk  (05/30/2022)   Received from [facility], [facility]   Overall Financial Resource Strain (CARDIA)    Difficulty of Paying Living Expenses: Not hard at all  Food Insecurity: No Food Insecurity (05/30/2022)   Received from [facility], [facility]   Hunger Vital Sign    Worried About Running Out of Food in the Last Year: Never true    Ran Out of Food in the Last Year: Never true  Transportation Needs: No Transportation Needs (06/01/2022)   Received from [facility], [facility]   [facility] - Transportation    In the past 12 months, has lack of transportation kept you from medical appointments or from getting medications?: No    Lack of Transportation (Non-Medical): No  Physical Activity: Not on file  Stress: Stress Concern Present (01/13/2022)   Received from [facility], [facility]  [employer] of Occupational Health - Occupational Stress Questionnaire    Feeling of Stress : To some extent  Social Connections: Not on file  Intimate Partner Violence: Not on file    FAMILY HISTORY: Family History  Problem Relation Age of Onset   Ovarian cancer Mother    Asthma Mother    Alcoholism Father    Throat cancer Maternal Uncle    Heart disease Maternal Uncle    Lung cancer Maternal Uncle    Lymphoma Maternal Uncle    Lung cancer Paternal Grandmother     ALLERGIES:  has no known allergies.  MEDICATIONS:  Current Outpatient Medications   Medication Sig Dispense Refill   acyclovir (ZOVIRAX) 400 MG tablet Take 1 tablet (400 mg total) by mouth 2 (two) times daily. 60 tablet 6   aspirin EC 81 MG tablet Take 81 mg by mouth daily. Swallow whole.     magnesium chloride (SLOW-MAG) 64 MG TBEC SR tablet Take 1 tablet (64 mg total) by mouth daily. 30 tablet 0   ondansetron (ZOFRAN) 8 MG tablet Take 1 tablet (8 mg total) by mouth 2 (two) times daily as needed for nausea or vomiting. 20 tablet 2   senna-docusate (SENOKOT-S) 8.6-50 MG tablet Take 1 tablet by mouth daily. 60 tablet 2   potassium chloride SA (KLOR-CON M20) 20 MEQ tablet Take 1 tablet (20 mEq total) by mouth daily. 90 tablet 0   REVLIMID 10 MG capsule Take 1 capsule (10 mg total) by mouth daily. Siri Cole # [id number]  Date Obtained: 03/02/23 28 capsule 0   No current facility-administered medications for this visit.    Review of Systems  Constitutional:  Negative for appetite change, chills, fatigue, fever and unexpected weight change.  HENT:   Negative for hearing loss, sore throat and voice change.   Eyes:  Negative for eye problems and icterus.  Respiratory:  Negative for chest tightness, cough and shortness of breath.   Cardiovascular:  Negative for chest pain and leg swelling.  Gastrointestinal:  Negative for abdominal distention and abdominal pain.  Endocrine: Negative for hot flashes.  Genitourinary:  Negative for difficulty urinating, dysuria and frequency.   Musculoskeletal:  Negative for arthralgias and back pain.  Skin:  Negative for itching and rash.  Neurological:  Negative for light-headedness and numbness.  Hematological:  Negative for adenopathy. Does not bruise/bleed easily.  Psychiatric/Behavioral:  Negative for confusion.      PHYSICAL EXAMINATION: ECOG PERFORMANCE STATUS: 1 - Symptomatic but completely ambulatory  Vitals:   03/02/23 0937  BP: 121/77  Pulse: 93  Resp: 18  Temp: 97.8 F (36.6 C)  SpO2: 99%   Filed Weights   03/02/23 0937   Weight: 245 lb 11.2 oz (111.4 kg)     Physical Exam Constitutional:      General: He is not in acute distress.    Appearance: He is obese. He is not diaphoretic.  HENT:     Head: Normocephalic and atraumatic.     Mouth/Throat:     Pharynx: No oropharyngeal exudate.  Eyes:     General: No scleral icterus. Cardiovascular:     Rate and Rhythm: Normal rate.     Heart sounds: No murmur heard. Pulmonary:     Effort: Pulmonary effort is normal. No respiratory distress.  Abdominal:     General: There is no distension.     Palpations: Abdomen is soft.  Musculoskeletal:        General: Normal range of motion.  Cervical back: Normal range of motion.  Skin:    General: Skin is warm and dry.     Findings: No erythema.  Neurological:     Mental Status: He is alert and oriented to person, place, and time. Mental status is at baseline.     Cranial Nerves: No cranial nerve deficit.     Motor: No abnormal muscle tone.  Psychiatric:        Mood and Affect: Mood and affect normal.      LABORATORY DATA:  I have reviewed the data as listed    Latest Ref Rng & Units 03/02/2023    9:29 AM 01/25/2023   10:51 AM 12/25/2022   10:57 AM  CBC  WBC 4.0 - 10.5 K/uL 4.6  4.0  4.8   Hemoglobin 13.0 - 17.0 g/dL 95.2  84.1  32.4   Hematocrit 39.0 - 52.0 % 35.4  37.1  34.1   Platelets 150 - 400 K/uL 228  220  222       Latest Ref Rng & Units 03/02/2023    9:29 AM 01/25/2023   10:51 AM 12/25/2022   10:58 AM  CMP  Glucose 70 - 99 mg/dL 401  027  96   BUN 6 - 20 mg/dL 13  13  7    Creatinine 0.61 - 1.24 mg/dL 2.53  6.64  4.03   Sodium 135 - 145 mmol/L 136  136  138   Potassium 3.5 - 5.1 mmol/L 3.5  3.8  3.7   Chloride 98 - 111 mmol/L 103  105  104   CO2 22 - 32 mmol/L 24  22  25    Calcium 8.9 - 10.3 mg/dL 9.0  8.9  8.7   Total Protein 6.5 - 8.1 g/dL 7.4  7.0  7.0   Total Bilirubin 0.0 - 1.2 mg/dL 0.9  0.5  0.7   Alkaline Phos 38 - 126 U/L 71  78  82   AST 15 - 41 U/L 27  21  18    ALT 0 -  44 U/L 28  19  16         RADIOGRAPHIC STUDIES: I have personally reviewed the radiological images as listed and agreed with the findings in the report. No results found.

## 2023-03-02 NOTE — Assessment & Plan Note (Signed)
 08/21/22 PET showed no active disease.  Not cleared by dentist to start bisphosphonate.  He has dental work that needs to be finished before bisphosphonate treatments

## 2023-03-02 NOTE — Assessment & Plan Note (Signed)
 continue potassium supplementation.

## 2023-03-02 NOTE — Assessment & Plan Note (Signed)
#   IgG lamda multiple myeloma, status post autologous marrow transplant on 05/22/2022 08/04/2022 Bone marrow biopsy done at [facility] showed no plasma cell. - in remission.  Post transplant PET scan showed no hypermetabolic activity Labs are reviewed and discussed with patient. Counts are stable. Tolerate treatment well.  Continue Revlimid 10mg  daily  Recommend Aspirin 81mg  daily  Acyclovir daily for 1 year post transplant.

## 2023-03-02 NOTE — Assessment & Plan Note (Signed)
 Stable  monitor

## 2023-03-03 ENCOUNTER — Other Ambulatory Visit: Payer: Self-pay

## 2023-03-03 LAB — KAPPA/LAMBDA LIGHT CHAINS
Kappa free light chain: 11.8 mg/L (ref 3.3–19.4)
Kappa, lambda light chain ratio: 0.53 (ref 0.26–1.65)
Lambda free light chains: 22.2 mg/L (ref 5.7–26.3)

## 2023-03-12 ENCOUNTER — Other Ambulatory Visit: Payer: Self-pay

## 2023-03-16 LAB — MULTIPLE MYELOMA PANEL, SERUM
Albumin SerPl Elph-Mcnc: 3.9 g/dL (ref 2.9–4.4)
Albumin/Glob SerPl: 1.4 (ref 0.7–1.7)
Alpha 1: 0.2 g/dL (ref 0.0–0.4)
Alpha2 Glob SerPl Elph-Mcnc: 0.6 g/dL (ref 0.4–1.0)
B-Globulin SerPl Elph-Mcnc: 1.2 g/dL (ref 0.7–1.3)
Gamma Glob SerPl Elph-Mcnc: 0.9 g/dL (ref 0.4–1.8)
Globulin, Total: 2.9 g/dL (ref 2.2–3.9)
IgA: 101 mg/dL (ref 90–386)
IgG (Immunoglobin G), Serum: 1020 mg/dL (ref 603–1613)
IgM (Immunoglobulin M), Srm: 23 mg/dL (ref 20–172)
Total Protein ELP: 6.8 g/dL (ref 6.0–8.5)

## 2023-04-02 ENCOUNTER — Inpatient Hospital Stay: Payer: Commercial Managed Care - PPO | Attending: Oncology

## 2023-04-02 ENCOUNTER — Encounter: Payer: Self-pay | Admitting: Oncology

## 2023-04-02 ENCOUNTER — Inpatient Hospital Stay: Payer: Commercial Managed Care - PPO | Admitting: Oncology

## 2023-04-02 VITALS — BP 110/77 | HR 80 | Temp 96.6°F | Resp 16 | Wt 242.5 lb

## 2023-04-02 DIAGNOSIS — Z79899 Other long term (current) drug therapy: Secondary | ICD-10-CM | POA: Insufficient documentation

## 2023-04-02 DIAGNOSIS — Z5111 Encounter for antineoplastic chemotherapy: Secondary | ICD-10-CM | POA: Diagnosis not present

## 2023-04-02 DIAGNOSIS — D649 Anemia, unspecified: Secondary | ICD-10-CM

## 2023-04-02 DIAGNOSIS — F17290 Nicotine dependence, other tobacco product, uncomplicated: Secondary | ICD-10-CM | POA: Insufficient documentation

## 2023-04-02 DIAGNOSIS — C7951 Secondary malignant neoplasm of bone: Secondary | ICD-10-CM | POA: Insufficient documentation

## 2023-04-02 DIAGNOSIS — Z807 Family history of other malignant neoplasms of lymphoid, hematopoietic and related tissues: Secondary | ICD-10-CM | POA: Insufficient documentation

## 2023-04-02 DIAGNOSIS — C9001 Multiple myeloma in remission: Secondary | ICD-10-CM | POA: Insufficient documentation

## 2023-04-02 DIAGNOSIS — Z801 Family history of malignant neoplasm of trachea, bronchus and lung: Secondary | ICD-10-CM | POA: Diagnosis not present

## 2023-04-02 DIAGNOSIS — E876 Hypokalemia: Secondary | ICD-10-CM

## 2023-04-02 DIAGNOSIS — C9000 Multiple myeloma not having achieved remission: Secondary | ICD-10-CM | POA: Diagnosis present

## 2023-04-02 LAB — CMP (CANCER CENTER ONLY)
ALT: 31 U/L (ref 0–44)
AST: 33 U/L (ref 15–41)
Albumin: 4.1 g/dL (ref 3.5–5.0)
Alkaline Phosphatase: 81 U/L (ref 38–126)
Anion gap: 9 (ref 5–15)
BUN: 14 mg/dL (ref 6–20)
CO2: 22 mmol/L (ref 22–32)
Calcium: 9 mg/dL (ref 8.9–10.3)
Chloride: 105 mmol/L (ref 98–111)
Creatinine: 1.05 mg/dL (ref 0.61–1.24)
GFR, Estimated: >60 mL/min (ref >60)
Glucose, Bld: 95 mg/dL (ref 70–99)
Potassium: 3.7 mmol/L (ref 3.5–5.1)
Sodium: 136 mmol/L (ref 135–145)
Total Bilirubin: 1.3 mg/dL — ABNORMAL HIGH (ref 0.0–1.2)
Total Protein: 7.4 g/dL (ref 6.5–8.1)

## 2023-04-02 LAB — CBC WITH DIFFERENTIAL (CANCER CENTER ONLY)
Abs Immature Granulocytes: 0.02 10*3/uL (ref 0.00–0.07)
Basophils Absolute: 0 10*3/uL (ref 0.0–0.1)
Basophils Relative: 1 %
Eosinophils Absolute: 0.2 10*3/uL (ref 0.0–0.5)
Eosinophils Relative: 4 %
HCT: 35.4 % — ABNORMAL LOW (ref 39.0–52.0)
Hemoglobin: 11.8 g/dL — ABNORMAL LOW (ref 13.0–17.0)
Immature Granulocytes: 1 %
Lymphocytes Relative: 29 %
Lymphs Abs: 1.3 10*3/uL (ref 0.7–4.0)
MCH: 28 pg (ref 26.0–34.0)
MCHC: 33.3 g/dL (ref 30.0–36.0)
MCV: 83.9 fL (ref 80.0–100.0)
Monocytes Absolute: 0.6 10*3/uL (ref 0.1–1.0)
Monocytes Relative: 13 %
Neutro Abs: 2.3 10*3/uL (ref 1.7–7.7)
Neutrophils Relative %: 52 %
Platelet Count: 227 10*3/uL (ref 150–400)
RBC: 4.22 MIL/uL (ref 4.22–5.81)
RDW: 14.9 % (ref 11.5–15.5)
WBC Count: 4.3 10*3/uL (ref 4.0–10.5)
nRBC: 0 % (ref 0.0–0.2)

## 2023-04-02 LAB — MAGNESIUM: Magnesium: 2 mg/dL (ref 1.7–2.4)

## 2023-04-02 NOTE — Assessment & Plan Note (Signed)
 Normal magnesium. continue slow mag 1 tab daily.

## 2023-04-02 NOTE — Assessment & Plan Note (Signed)
 Chemotherapy plan as listed above

## 2023-04-02 NOTE — Assessment & Plan Note (Signed)
 Stable  monitor

## 2023-04-02 NOTE — Progress Notes (Signed)
 Patient denies new or acute problems/concerns today.

## 2023-04-02 NOTE — Assessment & Plan Note (Signed)
 08/21/22 PET showed no active disease.  Will obtain dental clearance . He is Hydrologist work

## 2023-04-02 NOTE — Assessment & Plan Note (Signed)
 continue potassium supplementation.

## 2023-04-02 NOTE — Progress Notes (Signed)
 Hematology/Oncology Progress note [phone] [phone]     CHIEF COMPLAINTS/PURPOSE OF CONSULTATION:  Multiple myeloma  ASSESSMENT & PLAN:   Cancer Staging  Multiple myeloma in remission [facility]) Staging form: Plasma Cell Myeloma and Plasma Cell Disorders, AJCC 8th Edition - Clinical stage from 08/20/2021: RISS Stage II (Beta-2-microglobulin (mg/L): 3.6, Albumin (g/dL): 2.2, ISS: Stage II, High-risk cytogenetics: Absent, LDH: Normal) - Signed by Rickard Patience, MD on 10/14/2021   Multiple myeloma in remission (HCC) # IgG lamda multiple myeloma, status post autologous marrow transplant on 05/22/2022 08/04/2022 Bone marrow biopsy done at [facility] showed no plasma cell. - in remission.  Post transplant PET scan showed no hypermetabolic activity Labs are reviewed and discussed with patient. Counts are stable. Tolerate treatment well.  Continue Revlimid 10mg  daily  Recommend Aspirin 81mg  daily  Acyclovir daily for 1 year post transplant.   Encounter for antineoplastic chemotherapy Chemotherapy plan as listed above.   Metastatic cancer to bone (HCC) 08/21/22 PET showed no active disease.  Will obtain dental clearance . He is finishing dental work      Normocytic anemia Stable monitor.    Hypokalemia continue potassium supplementation.   Hypomagnesemia Normal magnesium. continue slow mag 1 tab daily.    Follow-up in 4 weeks All questions were answered. The patient knows to call the clinic with any problems, questions or concerns. No barriers to learning was detected.  Rickard Patience, MD 04/02/2023    HISTORY OF PRESENTING ILLNESS:  Tanner [name] 38 y.o. male presents  for follow up of Multiple myeloma I have reviewed his chart and materials related to his cancer extensively and collaborated history with the patient. Summary of oncologic history is as follows: Oncology History  Multiple myeloma in remission (HCC)  08/14/2021 Imaging   CT chest  abdomen pelvis without contrast Showed numerous lucencies throughout the bone structures, most pronounced throughout the spine and left left iliac bone.  Otherwise no acute findings in the chest abdomen or pelvis.  Sigmoid diverticulosis.   08/19/2021 Initial Diagnosis   Multiple myeloma not having achieved remission, stage II  -08/15/2021, bone marrow biopsy showed plasma cell myeloma involving greater than 80% of the marrow. Cytogenetics Myeloma  FISH   08/20/2021 Cancer Staging   Staging form: Plasma Cell Myeloma and Plasma Cell Disorders, AJCC 8th Edition - Clinical stage from 08/20/2021: RISS Stage II (Beta-2-microglobulin (mg/L): 3.6, Albumin (g/dL): 2.2, ISS: Stage II, High-risk cytogenetics: Absent, LDH: Normal) - Signed by Rickard Patience, MD on 10/14/2021 Beta 2 microglobulin range (mg/L): 3.5 to 5.49 Albumin range (g/dL): Less than 3.5 Cytogenetics: No abnormalities   08/20/2021 -  Chemotherapy   MYELOMA NEWLY DIAGNOSED TRANSPLANT CANDIDATE DaraVRd (Daratumumab SUBQ) q21d x 6 Cycles (Induction/Consolidation)      09/11/2021 Imaging   PET scan showed  1. Extensive multifocal hypermetabolic disease throughout the axial and appendicular skeleton, with numerous lucent osseous lesions,consistent with patient's known diagnosis of multiple myeloma. 2. No tracer avid lymph node or mass identified to suggest soft tissue plasmacytoma   12/18/2021 Imaging   MRI lumbar 1. Multiple scattered enhancing marrow replacing bone lesions within the imaged spine and pelvis consistent with known history of multiple myeloma. No pathologic fracture or extraosseous soft tissue component. 2. Unchanged mild multilevel degenerative changes of the lumbar spine resulting in mild canal stenosis at L2-L3 and L3-L4.  3. Left greater than right subarticular recess stenosis and mild left foraminal stenosis at L5-S1.      02/25/2022 Imaging   PET  1. Decreased  FDG avidity associated with the multifocal hypermetabolic  osseous lesions, consistent with treatment response. 2. Unchanged pathologic fracture of the T8 superior endplate. 3. Prominent bilateral cervical lymph nodes are similar in size to prior but now demonstrate mildly metabolic activity, nonspecific but favored to be reactive. Attention on follow-up imaging suggested 4. Hypermetabolic hyperplasia of the tonsils, also favored reactive.   05/22/2022 Bone Marrow Transplant   Patient underwent autologous stem cell transplant. 05/30/2022, neutropenic fever.   06/03/2022 engraftment 06/04/2022, G-CSF stopped.    08/21/2022 Imaging   PET  No evidence of hypermetabolic myeloma.      INTERVAL HISTORY Zalan Shidler is a 38 y.o. male who has above history reviewed by me today presents for follow up visit for management of multiple myeloma Patient is status post autologous stem cell transplant.   He is doing well.he takes Revlimid 10mg  daily. He tolerates well. He denies fever, chills, nausea vomiting diarrhea. He get immunization [facility].  Recently finished dental work. Marland Kitchen   MEDICAL HISTORY:  Past Medical History:  Diagnosis Date   Alcohol use    Sciatica    Smoking     SURGICAL HISTORY: Past Surgical History:  Procedure Laterality Date   INGUINAL HERNIA REPAIR      SOCIAL HISTORY: Social History   Socioeconomic History   Marital status: Single    Spouse name: Not on file   Number of children: Not on file   Years of education: Not on file   Highest education level: Not on file  Occupational History   Not on file  Tobacco Use   Smoking status: Former    Current packs/day: 0.00    Average packs/day: 0.5 packs/day for 10.0 years (5.0 ttl pk-yrs)    Types: E-cigarettes, Cigarettes    Start date: 08/12/2011    Quit date: 08/11/2021    Years since quitting: 1.6   Smokeless tobacco: Never  Vaping Use   Vaping status: Every Day  Substance and Sexual Activity   Alcohol use: Not Currently    Alcohol/week: 24.0 standard drinks  of alcohol    Types: 24 Cans of beer per week    Comment: quit drinking approx 08/11/2021   Drug use: Never   Sexual activity: Not on file  Other Topics Concern   Not on file  Social History Narrative   Not on file   Social Drivers of Health   Financial Resource Strain: Low Risk  (05/30/2022)   Received from [facility], [facility]   Overall Financial Resource Strain (CARDIA)    Difficulty of Paying Living Expenses: Not hard at all  Food Insecurity: No Food Insecurity (05/30/2022)   Received from [facility], [facility]   Hunger Vital Sign    Worried About Running Out of Food in the Last Year: Never true    Ran Out of Food in the Last Year: Never true  Transportation Needs: No Transportation Needs (06/01/2022)   Received from [facility], [facility]   [facility] - Transportation    In the past 12 months, has lack of transportation kept you from medical appointments or from getting medications?: No    Lack of Transportation (Non-Medical): No  Physical Activity: Not on file  Stress: Stress Concern Present (01/13/2022)   Received from [facility], [facility]   [employer] [facility] - Occupational Stress Questionnaire    Feeling of Stress : To  some extent  Social Connections: Not on file  Intimate Partner Violence: Not on file    FAMILY HISTORY: Family History  Problem Relation Age of Onset   Ovarian cancer Mother    Asthma Mother    Alcoholism Father    Throat cancer Maternal Uncle    Heart disease Maternal Uncle    Lung cancer Maternal Uncle    Lymphoma Maternal Uncle    Lung cancer Paternal Grandmother     ALLERGIES:  has no known allergies.  MEDICATIONS:  Current Outpatient Medications  Medication Sig Dispense Refill   acyclovir (ZOVIRAX) 400 MG tablet Take 1 tablet (400 mg total) by mouth 2 (two) times daily.  60 tablet 6   aspirin EC 81 MG tablet Take 81 mg by mouth daily. Swallow whole.     magnesium chloride (SLOW-MAG) 64 MG TBEC SR tablet Take 1 tablet (64 mg total) by mouth daily. 30 tablet 0   ondansetron (ZOFRAN) 8 MG tablet Take 1 tablet (8 mg total) by mouth 2 (two) times daily as needed for nausea or vomiting. 20 tablet 2   potassium chloride SA (KLOR-CON M20) 20 MEQ tablet Take 1 tablet (20 mEq total) by mouth daily. 90 tablet 0   REVLIMID 10 MG capsule Take 1 capsule (10 mg total) by mouth daily. [employer] Auth # [id number]  Date Obtained: 03/02/23 28 capsule 0   senna-docusate (SENOKOT-S) 8.6-50 MG tablet Take 1 tablet by mouth daily. 60 tablet 2   No current facility-administered medications for this visit.    Review of Systems  Constitutional:  Negative for appetite change, chills, fatigue, fever and unexpected weight change.  HENT:   Negative for hearing loss, sore throat and voice change.   Eyes:  Negative for eye problems and icterus.  Respiratory:  Negative for chest tightness, cough and shortness of breath.   Cardiovascular:  Negative for chest pain and leg swelling.  Gastrointestinal:  Negative for abdominal distention and abdominal pain.  Endocrine: Negative for hot flashes.  Genitourinary:  Negative for difficulty urinating, dysuria and frequency.   Musculoskeletal:  Negative for arthralgias and back pain.  Skin:  Negative for itching and rash.  Neurological:  Negative for light-headedness and numbness.  Hematological:  Negative for adenopathy. Does not bruise/bleed easily.  Psychiatric/Behavioral:  Negative for confusion.      PHYSICAL EXAMINATION: ECOG PERFORMANCE STATUS: 1 - Symptomatic but completely ambulatory  Vitals:   04/02/23 1100  BP: 110/77  Pulse: 80  Resp: 16  Temp: (!) 96.6 F (35.9 C)   Filed Weights   04/02/23 1100  Weight: 242 lb 8 oz (110 kg)     Physical Exam Constitutional:      General: He is not in acute distress.    Appearance: He is  obese. He is not diaphoretic.  HENT:     Head: Normocephalic and atraumatic.     Mouth/Throat:     Pharynx: No oropharyngeal exudate.  Eyes:     General: No scleral icterus. Cardiovascular:     Rate and Rhythm: Normal rate.     Heart sounds: No murmur heard. Pulmonary:     Effort: Pulmonary effort is normal. No respiratory distress.  Abdominal:     General: There is no distension.     Palpations: Abdomen is soft.  Musculoskeletal:        General: Normal range of motion.     Cervical back: Normal range of motion.  Skin:    General: Skin is warm and dry.  Findings: No erythema.  Neurological:     Mental Status: He is alert and oriented to person, place, and time. Mental status is at baseline.     Cranial Nerves: No cranial nerve deficit.     Motor: No abnormal muscle tone.  Psychiatric:        Mood and Affect: Mood and affect normal.      LABORATORY DATA:  I have reviewed the data as listed    Latest Ref Rng & Units 04/02/2023   11:27 AM 03/02/2023    9:29 AM 01/25/2023   10:51 AM  CBC  WBC 4.0 - 10.5 K/uL 4.3  4.6  4.0   Hemoglobin 13.0 - 17.0 g/dL 86.5  78.4  69.6   Hematocrit 39.0 - 52.0 % 35.4  35.4  37.1   Platelets 150 - 400 K/uL 227  228  220       Latest Ref Rng & Units 04/02/2023   11:28 AM 03/02/2023    9:29 AM 01/25/2023   10:51 AM  CMP  Glucose 70 - 99 mg/dL 95  295  284   BUN 6 - 20 mg/dL 14  13  13    Creatinine 0.61 - 1.24 mg/dL 1.32  4.40  1.02   Sodium 135 - 145 mmol/L 136  136  136   Potassium 3.5 - 5.1 mmol/L 3.7  3.5  3.8   Chloride 98 - 111 mmol/L 105  103  105   CO2 22 - 32 mmol/L 22  24  22    Calcium 8.9 - 10.3 mg/dL 9.0  9.0  8.9   Total Protein 6.5 - 8.1 g/dL 7.4  7.4  7.0   Total Bilirubin 0.0 - 1.2 mg/dL 1.3  0.9  0.5   Alkaline Phos 38 - 126 U/L 81  71  78   AST 15 - 41 U/L 33  27  21   ALT 0 - 44 U/L 31  28  19         RADIOGRAPHIC STUDIES: I have personally reviewed the radiological images as listed and agreed with the  findings in the report. No results found.

## 2023-04-02 NOTE — Assessment & Plan Note (Signed)
#   IgG lamda multiple myeloma, status post autologous marrow transplant on 05/22/2022 08/04/2022 Bone marrow biopsy done at [facility] showed no plasma cell. - in remission.  Post transplant PET scan showed no hypermetabolic activity Labs are reviewed and discussed with patient. Counts are stable. Tolerate treatment well.  Continue Revlimid 10mg  daily  Recommend Aspirin 81mg  daily  Acyclovir daily for 1 year post transplant.

## 2023-04-03 ENCOUNTER — Other Ambulatory Visit: Payer: Self-pay

## 2023-04-05 LAB — KAPPA/LAMBDA LIGHT CHAINS
Kappa free light chain: 11.2 mg/L (ref 3.3–19.4)
Kappa, lambda light chain ratio: 0.53 (ref 0.26–1.65)
Lambda free light chains: 21.2 mg/L (ref 5.7–26.3)

## 2023-04-05 LAB — MULTIPLE MYELOMA PANEL, SERUM
Albumin SerPl Elph-Mcnc: 3.6 g/dL (ref 2.9–4.4)
Albumin/Glob SerPl: 1.3 (ref 0.7–1.7)
Alpha 1: 0.2 g/dL (ref 0.0–0.4)
Alpha2 Glob SerPl Elph-Mcnc: 0.6 g/dL (ref 0.4–1.0)
B-Globulin SerPl Elph-Mcnc: 1.2 g/dL (ref 0.7–1.3)
Gamma Glob SerPl Elph-Mcnc: 1 g/dL (ref 0.4–1.8)
Globulin, Total: 3 g/dL (ref 2.2–3.9)
IgA: 113 mg/dL (ref 90–386)
IgG (Immunoglobin G), Serum: 1102 mg/dL (ref 603–1613)
IgM (Immunoglobulin M), Srm: 36 mg/dL (ref 20–172)
Total Protein ELP: 6.6 g/dL (ref 6.0–8.5)

## 2023-04-06 ENCOUNTER — Telehealth: Payer: Self-pay | Admitting: *Deleted

## 2023-04-06 ENCOUNTER — Other Ambulatory Visit: Payer: Self-pay

## 2023-04-06 DIAGNOSIS — C9000 Multiple myeloma not having achieved remission: Secondary | ICD-10-CM

## 2023-04-06 MED ORDER — REVLIMID 10 MG PO CAPS: 10.0000 mg | ORAL_CAPSULE | Freq: Every day | ORAL | 0 refills | Status: DC

## 2023-04-06 NOTE — Telephone Encounter (Signed)
 Pt needs to have revlimid  rx sent through [facility]

## 2023-04-06 NOTE — Telephone Encounter (Signed)
 Refill has been sent.

## 2023-04-08 ENCOUNTER — Other Ambulatory Visit: Payer: Self-pay

## 2023-04-21 ENCOUNTER — Other Ambulatory Visit: Payer: Self-pay

## 2023-04-27 ENCOUNTER — Encounter: Payer: Self-pay | Admitting: Oncology

## 2023-05-03 ENCOUNTER — Encounter: Payer: Self-pay | Admitting: Oncology

## 2023-05-03 ENCOUNTER — Other Ambulatory Visit: Payer: Self-pay | Admitting: Oncology

## 2023-05-03 ENCOUNTER — Inpatient Hospital Stay

## 2023-05-03 ENCOUNTER — Inpatient Hospital Stay (HOSPITAL_BASED_OUTPATIENT_CLINIC_OR_DEPARTMENT_OTHER): Admitting: Oncology

## 2023-05-03 ENCOUNTER — Inpatient Hospital Stay: Attending: Oncology

## 2023-05-03 VITALS — BP 130/89 | HR 93 | Temp 98.9°F | Resp 20 | Wt 249.7 lb

## 2023-05-03 VITALS — BP 134/87 | HR 80

## 2023-05-03 DIAGNOSIS — E876 Hypokalemia: Secondary | ICD-10-CM

## 2023-05-03 DIAGNOSIS — Z79899 Other long term (current) drug therapy: Secondary | ICD-10-CM | POA: Insufficient documentation

## 2023-05-03 DIAGNOSIS — C7951 Secondary malignant neoplasm of bone: Secondary | ICD-10-CM

## 2023-05-03 DIAGNOSIS — C9001 Multiple myeloma in remission: Secondary | ICD-10-CM

## 2023-05-03 DIAGNOSIS — C9000 Multiple myeloma not having achieved remission: Secondary | ICD-10-CM | POA: Diagnosis not present

## 2023-05-03 DIAGNOSIS — Z5111 Encounter for antineoplastic chemotherapy: Secondary | ICD-10-CM

## 2023-05-03 DIAGNOSIS — D649 Anemia, unspecified: Secondary | ICD-10-CM

## 2023-05-03 LAB — CBC WITH DIFFERENTIAL (CANCER CENTER ONLY)
Abs Immature Granulocytes: 0.03 10*3/uL (ref 0.00–0.07)
Basophils Absolute: 0 10*3/uL (ref 0.0–0.1)
Basophils Relative: 1 %
Eosinophils Absolute: 0.1 10*3/uL (ref 0.0–0.5)
Eosinophils Relative: 2 %
HCT: 36.4 % — ABNORMAL LOW (ref 39.0–52.0)
Hemoglobin: 12.4 g/dL — ABNORMAL LOW (ref 13.0–17.0)
Immature Granulocytes: 1 %
Lymphocytes Relative: 23 %
Lymphs Abs: 1.4 10*3/uL (ref 0.7–4.0)
MCH: 28 pg (ref 26.0–34.0)
MCHC: 34.1 g/dL (ref 30.0–36.0)
MCV: 82.2 fL (ref 80.0–100.0)
Monocytes Absolute: 0.6 10*3/uL (ref 0.1–1.0)
Monocytes Relative: 10 %
Neutro Abs: 3.9 10*3/uL (ref 1.7–7.7)
Neutrophils Relative %: 63 %
Platelet Count: 228 10*3/uL (ref 150–400)
RBC: 4.43 MIL/uL (ref 4.22–5.81)
RDW: 14.6 % (ref 11.5–15.5)
WBC Count: 6.1 10*3/uL (ref 4.0–10.5)
nRBC: 0 % (ref 0.0–0.2)

## 2023-05-03 LAB — CMP (CANCER CENTER ONLY)
ALT: 29 U/L (ref 0–44)
AST: 29 U/L (ref 15–41)
Albumin: 4.4 g/dL (ref 3.5–5.0)
Alkaline Phosphatase: 69 U/L (ref 38–126)
Anion gap: 11 (ref 5–15)
BUN: 16 mg/dL (ref 6–20)
CO2: 23 mmol/L (ref 22–32)
Calcium: 9.3 mg/dL (ref 8.9–10.3)
Chloride: 100 mmol/L (ref 98–111)
Creatinine: 1.04 mg/dL (ref 0.61–1.24)
GFR, Estimated: >60 mL/min (ref >60)
Glucose, Bld: 92 mg/dL (ref 70–99)
Potassium: 3.5 mmol/L (ref 3.5–5.1)
Sodium: 134 mmol/L — ABNORMAL LOW (ref 135–145)
Total Bilirubin: 0.8 mg/dL (ref 0.0–1.2)
Total Protein: 7.6 g/dL (ref 6.5–8.1)

## 2023-05-03 LAB — MAGNESIUM: Magnesium: 1.9 mg/dL (ref 1.7–2.4)

## 2023-05-03 MED ORDER — ZOLEDRONIC ACID 4 MG/100ML IV SOLN
4.0000 mg | Freq: Once | INTRAVENOUS | Status: AC
  Administered 2023-05-03: 4 mg via INTRAVENOUS

## 2023-05-03 MED ORDER — OYSTER SHELL CALCIUM/D3 500-5 MG-MCG PO TABS: 2.0000 | ORAL_TABLET | Freq: Every day | ORAL | 5 refills | Status: DC

## 2023-05-03 MED ORDER — POTASSIUM CHLORIDE CRYS ER 20 MEQ PO TBCR: 20.0000 meq | EXTENDED_RELEASE_TABLET | Freq: Every day | ORAL | 0 refills | Status: DC

## 2023-05-03 MED ORDER — SODIUM CHLORIDE 0.9 % IV SOLN
INTRAVENOUS | Status: DC
  Filled 2023-05-03: qty 250

## 2023-05-03 NOTE — Assessment & Plan Note (Signed)
 continue potassium supplementation.

## 2023-05-03 NOTE — Assessment & Plan Note (Signed)
 Normal magnesium. continue slow mag 1 tab daily.

## 2023-05-03 NOTE — Assessment & Plan Note (Signed)
 Chemotherapy plan as listed above

## 2023-05-03 NOTE — Assessment & Plan Note (Addendum)
 08/21/22 PET showed no active disease.  dental clearance obtained.  Recommend Zometa Q3 months. . Recommend calcium and vitamin D supplementation

## 2023-05-03 NOTE — Assessment & Plan Note (Signed)
 Stable  monitor

## 2023-05-03 NOTE — Patient Instructions (Signed)

## 2023-05-03 NOTE — Assessment & Plan Note (Signed)
#   IgG lamda multiple myeloma, status post autologous marrow transplant on 05/22/2022 08/04/2022 Bone marrow biopsy done at [facility] showed no plasma cell. - in remission.  Post transplant PET scan showed no hypermetabolic activity Labs are reviewed and discussed with patient. Counts are stable. Tolerate treatment well.  Continue Revlimid 10mg  daily  Recommend Aspirin 81mg  daily  Acyclovir daily for 1 year post transplant.

## 2023-05-03 NOTE — Progress Notes (Signed)
 Hematology/Oncology Progress note [phone] Fax:([phone]) [phone]     CHIEF COMPLAINTS/PURPOSE OF CONSULTATION:  Multiple myeloma  ASSESSMENT & PLAN:   Cancer Staging  Multiple myeloma in remission [facility]) Staging form: Plasma Cell Myeloma and Plasma Cell Disorders, AJCC 8th Edition - Clinical stage from 08/20/2021: RISS Stage II (Beta-2 -microglobulin (mg/L): 3.6, Albumin (g/dL): 2.2, ISS: Stage II, High-risk cytogenetics: Absent, LDH: Normal) - Signed by Tanner Forbes, MD on 10/14/2021   Multiple myeloma in remission (HCC) # IgG lamda multiple myeloma, status post autologous marrow transplant on 05/22/2022 08/04/2022 Bone marrow biopsy done at [facility] showed no plasma cell. - in remission.  Post transplant PET scan showed no hypermetabolic activity Labs are reviewed and discussed with patient. Counts are stable. Tolerate treatment well.  Continue Revlimid  10mg  daily  Recommend Aspirin  81mg  daily  Acyclovir  daily for 1 year post transplant.   Encounter for antineoplastic chemotherapy Chemotherapy plan as listed above.   Metastatic cancer to bone (HCC) 08/21/22 PET showed no active disease.  dental clearance obtained.  Recommend Zometa Q3 months. . Recommend calcium and vitamin D supplementation      Hypokalemia continue potassium 20meq supplementation.   Hypomagnesemia Normal magnesium . continue slow mag 1 tab daily.    Follow-up in 4 weeks All questions were answered. The patient knows to call the clinic with any problems, questions or concerns. No barriers to learning was detected.  Tanner Forbes, MD 05/03/2023    HISTORY OF PRESENTING ILLNESS:  Tanner [name] 38 y.o. male presents  for follow up of Multiple myeloma I have reviewed his chart and materials related to his cancer extensively and collaborated history with the patient. Summary of oncologic history is as follows: Oncology History  Multiple myeloma in remission (HCC)  08/14/2021 Imaging   CT  chest abdomen pelvis without contrast Showed numerous lucencies throughout the bone structures, most pronounced throughout the spine and left left iliac bone.  Otherwise no acute findings in the chest abdomen or pelvis.  Sigmoid diverticulosis.   08/19/2021 Initial Diagnosis   Multiple myeloma not having achieved remission, stage II  -08/15/2021, bone marrow biopsy showed plasma cell myeloma involving greater than 80% of the marrow. Cytogenetics Myeloma  FISH   08/20/2021 Cancer Staging   Staging form: Plasma Cell Myeloma and Plasma Cell Disorders, AJCC 8th Edition - Clinical stage from 08/20/2021: RISS Stage II (Beta-2 -microglobulin (mg/L): 3.6, Albumin (g/dL): 2.2, ISS: Stage II, High-risk cytogenetics: Absent, LDH: Normal) - Signed by Tanner Forbes, MD on 10/14/2021 Beta 2 microglobulin range (mg/L): 3.5 to 5.49 Albumin range (g/dL): Less than 3.5 Cytogenetics: No abnormalities   08/20/2021 -  Chemotherapy   MYELOMA NEWLY DIAGNOSED TRANSPLANT CANDIDATE DaraVRd (Daratumumab  SUBQ) q21d x 6 Cycles (Induction/Consolidation)      09/11/2021 Imaging   PET scan showed  1. Extensive multifocal hypermetabolic disease throughout the axial and appendicular skeleton, with numerous lucent osseous lesions,consistent with patient's known diagnosis of multiple myeloma. 2. No tracer avid lymph node or mass identified to suggest soft tissue plasmacytoma   12/18/2021 Imaging   MRI lumbar 1. Multiple scattered enhancing marrow replacing bone lesions within the imaged spine and pelvis consistent with known history of multiple myeloma. No pathologic fracture or extraosseous soft tissue component. 2. Unchanged mild multilevel degenerative changes of the lumbar spine resulting in mild canal stenosis at L2-L3 and L3-L4.  3. Left greater than right subarticular recess stenosis and mild left foraminal stenosis at L5-S1.      02/25/2022 Imaging   PET  1. Decreased FDG avidity  associated with the multifocal  hypermetabolic osseous lesions, consistent with treatment response. 2. Unchanged pathologic fracture of the T8 superior endplate. 3. Prominent bilateral cervical lymph nodes are similar in size to prior but now demonstrate mildly metabolic activity, nonspecific but favored to be reactive. Attention on follow-up imaging suggested 4. Hypermetabolic hyperplasia of the tonsils, also favored reactive.   05/22/2022 Bone Marrow Transplant   Patient underwent autologous stem cell transplant. 05/30/2022, neutropenic fever.   06/03/2022 engraftment 06/04/2022, G-CSF stopped.    08/21/2022 Imaging   PET  No evidence of hypermetabolic myeloma.      INTERVAL HISTORY Tanner [name] is a 38 y.o. male who has above history reviewed by me today presents for follow up visit for management of multiple myeloma Patient is status post autologous stem cell transplant.   He is doing well.he takes Revlimid  10mg  daily. He tolerates well. He denies fever, chills, nausea vomiting diarrhea. He get immunization at [facility].  Recently finished dental work. Tanner [name]   MEDICAL HISTORY:  Past Medical History:  Diagnosis Date   Alcohol use    Sciatica    Smoking     SURGICAL HISTORY: Past Surgical History:  Procedure Laterality Date   INGUINAL HERNIA REPAIR      SOCIAL HISTORY: Social History   Socioeconomic History   Marital status: Single    Spouse name: Not on file   Number of children: Not on file   Years of education: Not on file   Highest education level: Not on file  Occupational History   Not on file  Tobacco Use   Smoking status: Former    Current packs/day: 0.00    Average packs/day: 0.5 packs/day for 10.0 years (5.0 ttl pk-yrs)    Types: E-cigarettes, Cigarettes    Start date: 08/12/2011    Quit date: 08/11/2021    Years since quitting: 1.7   Smokeless tobacco: Never  Vaping Use   Vaping status: Every Day  Substance and Sexual Activity   Alcohol use: Not Currently    Alcohol/week: 24.0  standard drinks of alcohol    Types: 24 Cans of beer per week    Comment: quit drinking approx 08/11/2021   Drug use: Never   Sexual activity: Not on file  Other Topics Concern   Not on file  Social History Narrative   Not on file   Social Drivers of Health   Financial Resource Strain: Low Risk  (05/30/2022)   Received from [facility], [facility]   Overall Financial Resource Strain (CARDIA)    Difficulty of Paying Living Expenses: Not hard at all  Food Insecurity: No Food Insecurity (05/30/2022)   Received from [facility], [facility]   Hunger Vital Sign    Worried About Running Out of Food in the Last Year: Never true    Ran Out of Food in the Last Year: Never true  Transportation Needs: No Transportation Needs (06/01/2022)   Received from [facility], [facility]   [facility] - Transportation    In the past 12 months, has lack of transportation kept you from medical appointments or from getting medications?: No    Lack of Transportation (Non-Medical): No  Physical Activity: Not on file  Stress: Stress Concern Present (01/13/2022)   Received from [facility], [facility]   [employer] of Occupational [facility] - Occupational Stress Questionnaire    Feeling of Stress : To some extent  Social Connections: Not on file  Intimate Partner Violence: Not on file    FAMILY HISTORY: Family History  Problem Relation Age of Onset   Ovarian cancer Mother    Asthma Mother    Alcoholism Father    Throat cancer Maternal Uncle    Heart disease Maternal Uncle    Lung cancer Maternal Uncle    Lymphoma Maternal Uncle    Lung cancer Paternal Grandmother     ALLERGIES:  has no known allergies.  MEDICATIONS:  Current Outpatient Medications  Medication Sig Dispense Refill   acyclovir  (ZOVIRAX ) 400 MG tablet Take 1 tablet (400 mg total) by mouth 2  (two) times daily. 60 tablet 6   aspirin  EC 81 MG tablet Take 81 mg by mouth daily. Swallow whole.     calcium-vitamin D (OSCAL WITH D) 500-5 MG-MCG tablet Take 2 tablets by mouth daily. 60 tablet 5   ondansetron  (ZOFRAN ) 8 MG tablet Take 1 tablet (8 mg total) by mouth 2 (two) times daily as needed for nausea or vomiting. 20 tablet 2   REVLIMID  10 MG capsule Take 1 capsule (10 mg total) by mouth daily. [employer] Auth # [id number]  Date Obtained: 03/02/23 28 capsule 0   senna-docusate (SENOKOT-S) 8.6-50 MG tablet Take 1 tablet by mouth daily. 60 tablet 2   potassium chloride  SA (KLOR-CON  M20) 20 MEQ tablet Take 1 tablet (20 mEq total) by mouth daily. 90 tablet 0   No current facility-administered medications for this visit.    Review of Systems  Constitutional:  Negative for appetite change, chills, fatigue, fever and unexpected weight change.  HENT:   Negative for hearing loss, sore throat and voice change.   Eyes:  Negative for eye problems and icterus.  Respiratory:  Negative for chest tightness, cough and shortness of breath.   Cardiovascular:  Negative for chest pain and leg swelling.  Gastrointestinal:  Negative for abdominal distention and abdominal pain.  Endocrine: Negative for hot flashes.  Genitourinary:  Negative for difficulty urinating, dysuria and frequency.   Musculoskeletal:  Negative for arthralgias and back pain.  Skin:  Negative for itching and rash.  Neurological:  Negative for light-headedness and numbness.  Hematological:  Negative for adenopathy. Does not bruise/bleed easily.  Psychiatric/Behavioral:  Negative for confusion.      PHYSICAL EXAMINATION: ECOG PERFORMANCE STATUS: 1 - Symptomatic but completely ambulatory  Vitals:   05/03/23 0952  BP: 130/89  Pulse: 93  Resp: 20  Temp: 98.9 F (37.2 C)  SpO2: 100%   Filed Weights   05/03/23 0952  Weight: 249 lb 11.2 oz (113.3 kg)     Physical Exam Constitutional:      General: He is not in acute  distress.    Appearance: He is obese. He is not diaphoretic.  HENT:     Head: Normocephalic and atraumatic.     Mouth/Throat:     Pharynx: No oropharyngeal exudate.  Eyes:     General: No scleral icterus. Cardiovascular:     Rate and Rhythm: Normal rate.     Heart sounds: No murmur heard. Pulmonary:     Effort: Pulmonary effort is normal. No respiratory distress.  Abdominal:     General: There is no distension.     Palpations: Abdomen is soft.  Musculoskeletal:        General: Normal range of motion.     Cervical back: Normal range of motion.  Skin:    General: Skin is warm and dry.     Findings:  No erythema.  Neurological:     Mental Status: He is alert and oriented to person, place, and time. Mental status is at baseline.     Cranial Nerves: No cranial nerve deficit.     Motor: No abnormal muscle tone.  Psychiatric:        Mood and Affect: Mood and affect normal.      LABORATORY DATA:  I have reviewed the data as listed    Latest Ref Rng & Units 05/03/2023    9:41 AM 04/02/2023   11:27 AM 03/02/2023    9:29 AM  CBC  WBC 4.0 - 10.5 K/uL 6.1  4.3  4.6   Hemoglobin 13.0 - 17.0 g/dL 16.1  09.6  04.5   Hematocrit 39.0 - 52.0 % 36.4  35.4  35.4   Platelets 150 - 400 K/uL 228  227  228       Latest Ref Rng & Units 05/03/2023    9:42 AM 04/02/2023   11:28 AM 03/02/2023    9:29 AM  CMP  Glucose 70 - 99 mg/dL 92  95  409   BUN 6 - 20 mg/dL 16  14  13    Creatinine 0.61 - 1.24 mg/dL 8.11  9.14  7.82   Sodium 135 - 145 mmol/L 134  136  136   Potassium 3.5 - 5.1 mmol/L 3.5  3.7  3.5   Chloride 98 - 111 mmol/L 100  105  103   CO2 22 - 32 mmol/L 23  22  24    Calcium 8.9 - 10.3 mg/dL 9.3  9.0  9.0   Total Protein 6.5 - 8.1 g/dL 7.6  7.4  7.4   Total Bilirubin 0.0 - 1.2 mg/dL 0.8  1.3  0.9   Alkaline Phos 38 - 126 U/L 69  81  71   AST 15 - 41 U/L 29  33  27   ALT 0 - 44 U/L 29  31  28         RADIOGRAPHIC STUDIES: I have personally reviewed the radiological images as  listed and agreed with the findings in the report. No results found.

## 2023-05-04 LAB — KAPPA/LAMBDA LIGHT CHAINS
Kappa free light chain: 9.2 mg/L (ref 3.3–19.4)
Kappa, lambda light chain ratio: 0.7 (ref 0.26–1.65)
Lambda free light chains: 13.2 mg/L (ref 5.7–26.3)

## 2023-05-05 ENCOUNTER — Encounter: Payer: Self-pay | Admitting: Oncology

## 2023-05-05 ENCOUNTER — Other Ambulatory Visit: Payer: Self-pay

## 2023-05-06 LAB — MULTIPLE MYELOMA PANEL, SERUM
Albumin SerPl Elph-Mcnc: 4.2 g/dL (ref 2.9–4.4)
Albumin/Glob SerPl: 1.5 (ref 0.7–1.7)
Alpha 1: 0.2 g/dL (ref 0.0–0.4)
Alpha2 Glob SerPl Elph-Mcnc: 0.6 g/dL (ref 0.4–1.0)
B-Globulin SerPl Elph-Mcnc: 1.3 g/dL (ref 0.7–1.3)
Gamma Glob SerPl Elph-Mcnc: 1 g/dL (ref 0.4–1.8)
Globulin, Total: 3 g/dL (ref 2.2–3.9)
IgA: 119 mg/dL (ref 90–386)
IgG (Immunoglobin G), Serum: 1126 mg/dL (ref 603–1613)
IgM (Immunoglobulin M), Srm: 34 mg/dL (ref 20–172)
Total Protein ELP: 7.2 g/dL (ref 6.0–8.5)

## 2023-05-11 ENCOUNTER — Other Ambulatory Visit: Payer: Self-pay | Admitting: Oncology

## 2023-05-11 ENCOUNTER — Other Ambulatory Visit: Payer: Self-pay

## 2023-05-11 DIAGNOSIS — C9000 Multiple myeloma not having achieved remission: Secondary | ICD-10-CM

## 2023-05-18 ENCOUNTER — Other Ambulatory Visit: Payer: Self-pay

## 2023-05-21 ENCOUNTER — Telehealth: Payer: Self-pay | Admitting: *Deleted

## 2023-05-21 NOTE — Telephone Encounter (Signed)
 The pharmacy is asking if it is a reasonable okay to change from Revlimid  to the generic lenalidomide .  They understand that they have help with the Revlimid  itself, but Tanner [name] says that also if you do lenalidomide . they also have grants to help  They wanted to see if they can go to generic with his. An  need rx

## 2023-05-24 NOTE — Telephone Encounter (Signed)
 Called [facility] and spoke to Isa Manuel, She would like to know if pt would like to switch to generic brand Lenalidominde as it is more cost effective. Informed them that we can ask patient and let them know. Mychart message sent to pt.

## 2023-05-27 ENCOUNTER — Other Ambulatory Visit: Payer: Self-pay | Admitting: Oncology

## 2023-05-27 DIAGNOSIS — C9000 Multiple myeloma not having achieved remission: Secondary | ICD-10-CM

## 2023-06-01 ENCOUNTER — Encounter: Payer: Self-pay | Admitting: Oncology

## 2023-06-01 ENCOUNTER — Telehealth: Payer: Self-pay | Admitting: *Deleted

## 2023-06-01 NOTE — Telephone Encounter (Signed)
 Call returned to [facility] and informed them that generic substitution is ok

## 2023-06-01 NOTE — Telephone Encounter (Signed)
 Per Kerney Pee spoke with Nellie Banas and from MD status it is fine to do the lenalidomide .  Ammon Bales said that Nellie Banas was going to get in touch with the patient and see if he is okay with the plan of going on lenalidomide  versus the Revlimid .  Kerney Pee is waiting for a phone call to see if they got in touch with the patient in which way they want to go on this medicine

## 2023-06-01 NOTE — Telephone Encounter (Signed)
 Pt has not replied back to [employer].  I called him and he did not answer.

## 2023-06-07 ENCOUNTER — Inpatient Hospital Stay (HOSPITAL_BASED_OUTPATIENT_CLINIC_OR_DEPARTMENT_OTHER): Admitting: Oncology

## 2023-06-07 ENCOUNTER — Inpatient Hospital Stay: Attending: Oncology

## 2023-06-07 ENCOUNTER — Encounter: Payer: Self-pay | Admitting: Oncology

## 2023-06-07 ENCOUNTER — Other Ambulatory Visit: Payer: Self-pay

## 2023-06-07 VITALS — BP 120/91 | HR 86 | Temp 97.4°F | Resp 20 | Wt 257.6 lb

## 2023-06-07 DIAGNOSIS — Z79899 Other long term (current) drug therapy: Secondary | ICD-10-CM | POA: Diagnosis not present

## 2023-06-07 DIAGNOSIS — Z801 Family history of malignant neoplasm of trachea, bronchus and lung: Secondary | ICD-10-CM | POA: Diagnosis not present

## 2023-06-07 DIAGNOSIS — E876 Hypokalemia: Secondary | ICD-10-CM | POA: Diagnosis not present

## 2023-06-07 DIAGNOSIS — C7951 Secondary malignant neoplasm of bone: Secondary | ICD-10-CM

## 2023-06-07 DIAGNOSIS — C9001 Multiple myeloma in remission: Secondary | ICD-10-CM | POA: Diagnosis present

## 2023-06-07 DIAGNOSIS — Z808 Family history of malignant neoplasm of other organs or systems: Secondary | ICD-10-CM | POA: Diagnosis not present

## 2023-06-07 DIAGNOSIS — F17290 Nicotine dependence, other tobacco product, uncomplicated: Secondary | ICD-10-CM | POA: Insufficient documentation

## 2023-06-07 DIAGNOSIS — Z8041 Family history of malignant neoplasm of ovary: Secondary | ICD-10-CM | POA: Insufficient documentation

## 2023-06-07 DIAGNOSIS — Z5111 Encounter for antineoplastic chemotherapy: Secondary | ICD-10-CM | POA: Diagnosis not present

## 2023-06-07 DIAGNOSIS — Z807 Family history of other malignant neoplasms of lymphoid, hematopoietic and related tissues: Secondary | ICD-10-CM | POA: Insufficient documentation

## 2023-06-07 DIAGNOSIS — D649 Anemia, unspecified: Secondary | ICD-10-CM

## 2023-06-07 LAB — CBC WITH DIFFERENTIAL (CANCER CENTER ONLY)
Abs Immature Granulocytes: 0.04 10*3/uL (ref 0.00–0.07)
Basophils Absolute: 0 10*3/uL (ref 0.0–0.1)
Basophils Relative: 1 %
Eosinophils Absolute: 0.2 10*3/uL (ref 0.0–0.5)
Eosinophils Relative: 4 %
HCT: 35.9 % — ABNORMAL LOW (ref 39.0–52.0)
Hemoglobin: 12 g/dL — ABNORMAL LOW (ref 13.0–17.0)
Immature Granulocytes: 1 %
Lymphocytes Relative: 30 %
Lymphs Abs: 1.3 10*3/uL (ref 0.7–4.0)
MCH: 27.6 pg (ref 26.0–34.0)
MCHC: 33.4 g/dL (ref 30.0–36.0)
MCV: 82.5 fL (ref 80.0–100.0)
Monocytes Absolute: 0.5 10*3/uL (ref 0.1–1.0)
Monocytes Relative: 10 %
Neutro Abs: 2.4 10*3/uL (ref 1.7–7.7)
Neutrophils Relative %: 54 %
Platelet Count: 224 10*3/uL (ref 150–400)
RBC: 4.35 MIL/uL (ref 4.22–5.81)
RDW: 13.9 % (ref 11.5–15.5)
WBC Count: 4.4 10*3/uL (ref 4.0–10.5)
nRBC: 0 % (ref 0.0–0.2)

## 2023-06-07 LAB — CMP (CANCER CENTER ONLY)
ALT: 26 U/L (ref 0–44)
AST: 23 U/L (ref 15–41)
Albumin: 3.7 g/dL (ref 3.5–5.0)
Alkaline Phosphatase: 67 U/L (ref 38–126)
Anion gap: 11 (ref 5–15)
BUN: 15 mg/dL (ref 6–20)
CO2: 20 mmol/L — ABNORMAL LOW (ref 22–32)
Calcium: 9 mg/dL (ref 8.9–10.3)
Chloride: 104 mmol/L (ref 98–111)
Creatinine: 1.14 mg/dL (ref 0.61–1.24)
GFR, Estimated: >60 mL/min (ref >60)
Glucose, Bld: 119 mg/dL — ABNORMAL HIGH (ref 70–99)
Potassium: 3.8 mmol/L (ref 3.5–5.1)
Sodium: 135 mmol/L (ref 135–145)
Total Bilirubin: 0.7 mg/dL (ref 0.0–1.2)
Total Protein: 7.3 g/dL (ref 6.5–8.1)

## 2023-06-07 LAB — MAGNESIUM: Magnesium: 1.8 mg/dL (ref 1.7–2.4)

## 2023-06-07 NOTE — Progress Notes (Signed)
 Hematology/Oncology Progress note [phone] [phone]     CHIEF COMPLAINTS/PURPOSE OF CONSULTATION:  Multiple myeloma  ASSESSMENT & PLAN:   Cancer Staging  Multiple myeloma in remission [facility]) Staging form: Plasma Cell Myeloma and Plasma Cell Disorders, AJCC 8th Edition - Clinical stage from 08/20/2021: RISS Stage II (Beta-2 -microglobulin (mg/L): 3.6, Albumin (g/dL): 2.2, ISS: Stage II, High-risk cytogenetics: Absent, LDH: Normal) - Signed by Timmy Forbes, MD on 10/14/2021   Multiple myeloma in remission (HCC) # IgG lamda multiple myeloma, 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 - in remission.  Post transplant PET scan showed no hypermetabolic activity Labs are reviewed and discussed with patient. Counts are stable. Tolerate treatment well.  Continue Revlimid  10mg  daily  Recommend Aspirin  81mg  daily  Acyclovir  daily for 1 year post transplant   Encounter for antineoplastic chemotherapy Chemotherapy plan as listed above.   Hypokalemia continue potassium 20meq supplementation.   Hypomagnesemia Normal magnesium . continue slow mag 1 tab daily.   Metastatic cancer to bone (HCC) 08/21/22 PET showed no active disease.  dental clearance obtained.  Recommend Zometa  Q3 months. . Recommend calcium  and vitamin D supplementation      Normocytic anemia Stable monitor.     Follow-up in 4 weeks All questions were answered. The patient knows to call the clinic with any problems, questions or concerns. No barriers to learning was detected.  Timmy Forbes, MD 06/07/2023    HISTORY OF PRESENTING ILLNESS:  Tanner [name] 38 y.o. male presents  for follow up of Multiple myeloma I have reviewed his chart and materials related to his cancer extensively and collaborated history with the patient. Summary of oncologic history is as follows: Oncology History  Multiple myeloma in  remission (HCC)  08/14/2021 Imaging   CT chest abdomen pelvis without contrast Showed numerous lucencies throughout the bone structures, most pronounced throughout the spine and left left iliac bone.  Otherwise no acute findings in the chest abdomen or pelvis.  Sigmoid diverticulosis.   08/19/2021 Initial Diagnosis   Multiple myeloma not having achieved remission, stage II  -08/15/2021, bone marrow biopsy showed plasma cell myeloma involving greater than 80% of the marrow. Cytogenetics Myeloma  FISH   08/20/2021 Cancer Staging   Staging form: Plasma Cell Myeloma and Plasma Cell Disorders, AJCC 8th Edition - Clinical stage from 08/20/2021: RISS Stage II (Beta-2 -microglobulin (mg/L): 3.6, Albumin (g/dL): 2.2, ISS: Stage II, High-risk cytogenetics: Absent, LDH: Normal) - Signed by Timmy Forbes, MD on 10/14/2021 Beta 2 microglobulin range (mg/L): 3.5 to 5.49 Albumin range (g/dL): Less than 3.5 Cytogenetics: No abnormalities   08/20/2021 -  Chemotherapy   MYELOMA NEWLY DIAGNOSED TRANSPLANT CANDIDATE DaraVRd (Daratumumab  SUBQ) q21d x 6 Cycles (Induction/Consolidation)      09/11/2021 Imaging   PET scan showed  1. Extensive multifocal hypermetabolic disease throughout the axial and appendicular skeleton, with numerous lucent osseous lesions,consistent with patient's known diagnosis of multiple myeloma. 2. No tracer avid lymph node or mass identified to suggest soft tissue plasmacytoma   12/18/2021 Imaging   MRI lumbar 1. Multiple scattered enhancing marrow replacing bone lesions within the imaged spine and pelvis consistent with known history of multiple myeloma. No pathologic fracture or extraosseous soft tissue component. 2. Unchanged mild multilevel degenerative changes of the lumbar spine resulting in mild canal stenosis at L2-L3 and L3-L4.  3. Left greater than right subarticular recess stenosis and mild left foraminal stenosis at L5-S1.      02/25/2022 Imaging  PET  1. Decreased FDG avidity  associated with the multifocal hypermetabolic osseous lesions, consistent with treatment response. 2. Unchanged pathologic fracture of the T8 superior endplate. 3. Prominent bilateral cervical lymph nodes are similar in size to prior but now demonstrate mildly metabolic activity, nonspecific but favored to be reactive. Attention on follow-up imaging suggested 4. Hypermetabolic hyperplasia of the tonsils, also favored reactive.   05/22/2022 Bone Marrow Transplant   Patient underwent autologous stem cell transplant. 05/30/2022, neutropenic fever.   06/03/2022 engraftment 06/04/2022, G-CSF stopped.    08/21/2022 Imaging   PET  No evidence of hypermetabolic myeloma.      INTERVAL HISTORY Tanner [name] is a 38 y.o. male who has above history reviewed by me today presents for follow up visit for management of multiple myeloma Patient is status post autologous stem cell transplant.   He is doing well.he takes Revlimid  10mg  daily. He tolerates well. He denies fever, chills, nausea vomiting diarrhea. He gets immunization [facility].  Tolerated zometa  well.   MEDICAL HISTORY:  Past Medical History:  Diagnosis Date   Alcohol use    Sciatica    Smoking     SURGICAL HISTORY: Past Surgical History:  Procedure Laterality Date   INGUINAL HERNIA REPAIR      SOCIAL HISTORY: Social History   Socioeconomic History   Marital status: Single    Spouse name: Not on file   Number of children: Not on file   Years of education: Not on file   Highest education level: Not on file  Occupational History   Not on file  Tobacco Use   Smoking status: Former    Current packs/day: 0.00    Average packs/day: 0.5 packs/day for 10.0 years (5.0 ttl pk-yrs)    Types: E-cigarettes, Cigarettes    Start date: 08/12/2011    Quit date: 08/11/2021    Years since quitting: 1.8   Smokeless tobacco: Never  Vaping Use   Vaping status: Every Day  Substance and Sexual Activity   Alcohol use: Not Currently     Alcohol/week: 24.0 standard drinks of alcohol    Types: 24 Cans of beer per week    Comment: quit drinking approx 08/11/2021   Drug use: Never   Sexual activity: Not on file  Other Topics Concern   Not on file  Social History Narrative   Not on file   Social Drivers of Health   Financial Resource Strain: Low Risk  (05/30/2022)   Received from [facility], [facility]   Overall Financial Resource Strain (CARDIA)    Difficulty of Paying Living Expenses: Not hard at all  Food Insecurity: No Food Insecurity (05/30/2022)   Received from [facility], [facility]   Hunger Vital Sign    Worried About Running Out of Food in the Last Year: Never true    Ran Out of Food in the Last Year: Never true  Transportation Needs: No Transportation Needs (06/01/2022)   Received from [facility], [facility]   [facility] - Transportation    In the past 12 months, has lack of transportation kept you from medical appointments or from getting medications?: No    Lack of Transportation (Non-Medical): No  Physical Activity: Not on file  Stress: Stress Concern Present (01/13/2022)   Received from [facility], [facility]   [employer] [facility] - Occupational Stress Questionnaire    Feeling of Stress :  To some extent  Social Connections: Not on file  Intimate Partner Violence: Not on file    FAMILY HISTORY: Family History  Problem Relation Age of Onset   Ovarian cancer Mother    Asthma Mother    Alcoholism Father    Throat cancer Maternal Uncle    Heart disease Maternal Uncle    Lung cancer Maternal Uncle    Lymphoma Maternal Uncle    Lung cancer Paternal Grandmother     ALLERGIES:  has no known allergies.  MEDICATIONS:  Current Outpatient Medications  Medication Sig Dispense Refill   acyclovir  (ZOVIRAX ) 400 MG tablet Take 1 tablet (400  mg total) by mouth 2 (two) times daily. 60 tablet 6   aspirin  EC 81 MG tablet Take 81 mg by mouth daily. Swallow whole.     CALCIUM /VITAMIN D3 GUMMIES 200-5 MG-MCG CHEW TAKE 2 TABLETS BY MOUTH EVERY DAY 60 tablet 5   Cyanocobalamin  1000 MCG SUBL Take 1,000 mcg by mouth.     lenalidomide  (REVLIMID ) 10 MG capsule TAKE 1 CAPSULE BY MOUTH 1 TIME A DAY 28 capsule 0   ondansetron  (ZOFRAN ) 8 MG tablet Take 1 tablet (8 mg total) by mouth 2 (two) times daily as needed for nausea or vomiting. 20 tablet 2   potassium chloride  SA (KLOR-CON  M20) 20 MEQ tablet Take 1 tablet (20 mEq total) by mouth daily. 90 tablet 0   senna-docusate (SENOKOT-S) 8.6-50 MG tablet Take 1 tablet by mouth daily. 60 tablet 2   No current facility-administered medications for this visit.    Review of Systems  Constitutional:  Negative for appetite change, chills, fatigue, fever and unexpected weight change.  HENT:   Negative for hearing loss, sore throat and voice change.   Eyes:  Negative for eye problems and icterus.  Respiratory:  Negative for chest tightness, cough and shortness of breath.   Cardiovascular:  Negative for chest pain and leg swelling.  Gastrointestinal:  Negative for abdominal distention and abdominal pain.  Endocrine: Negative for hot flashes.  Genitourinary:  Negative for difficulty urinating, dysuria and frequency.   Musculoskeletal:  Negative for arthralgias and back pain.  Skin:  Negative for itching and rash.  Neurological:  Negative for light-headedness and numbness.  Hematological:  Negative for adenopathy. Does not bruise/bleed easily.  Psychiatric/Behavioral:  Negative for confusion.      PHYSICAL EXAMINATION: ECOG PERFORMANCE STATUS: 1 - Symptomatic but completely ambulatory  Vitals:   06/07/23 0946  BP: (!) 120/91  Pulse: 86  Resp: 20  Temp: (!) 97.4 F (36.3 C)  SpO2: 100%   Filed Weights   06/07/23 0946  Weight: 257 lb 9.6 oz (116.8 kg)     Physical Exam Constitutional:       General: He is not in acute distress.    Appearance: He is obese. He is not diaphoretic.  HENT:     Head: Normocephalic and atraumatic.     Mouth/Throat:     Pharynx: No oropharyngeal exudate.  Eyes:     General: No scleral icterus. Cardiovascular:     Rate and Rhythm: Normal rate.     Heart sounds: No murmur heard. Pulmonary:     Effort: Pulmonary effort is normal. No respiratory distress.  Abdominal:     General: There is no distension.     Palpations: Abdomen is soft.  Musculoskeletal:        General: Normal range of motion.     Cervical back: Normal range of motion.  Skin:  General: Skin is warm and dry.     Findings: No erythema.  Neurological:     Mental Status: He is alert and oriented to person, place, and time. Mental status is at baseline.     Cranial Nerves: No cranial nerve deficit.     Motor: No abnormal muscle tone.  Psychiatric:        Mood and Affect: Mood and affect normal.      LABORATORY DATA:  I have reviewed the data as listed    Latest Ref Rng & Units 06/07/2023    9:31 AM 05/03/2023    9:41 AM 04/02/2023   11:27 AM  CBC  WBC 4.0 - 10.5 K/uL 4.4  6.1  4.3   Hemoglobin 13.0 - 17.0 g/dL 40.9  81.1  91.4   Hematocrit 39.0 - 52.0 % 35.9  36.4  35.4   Platelets 150 - 400 K/uL 224  228  227       Latest Ref Rng & Units 06/07/2023    9:31 AM 05/03/2023    9:42 AM 04/02/2023   11:28 AM  CMP  Glucose 70 - 99 mg/dL 782  92  95   BUN 6 - 20 mg/dL 15  16  14    Creatinine 0.61 - 1.24 mg/dL 9.56  2.13  0.86   Sodium 135 - 145 mmol/L 135  134  136   Potassium 3.5 - 5.1 mmol/L 3.8  3.5  3.7   Chloride 98 - 111 mmol/L 104  100  105   CO2 22 - 32 mmol/L 20  23  22    Calcium  8.9 - 10.3 mg/dL 9.0  9.3  9.0   Total Protein 6.5 - 8.1 g/dL 7.3  7.6  7.4   Total Bilirubin 0.0 - 1.2 mg/dL 0.7  0.8  1.3   Alkaline Phos 38 - 126 U/L 67  69  81   AST 15 - 41 U/L 23  29  33   ALT 0 - 44 U/L 26  29  31         RADIOGRAPHIC STUDIES: I have personally reviewed  the radiological images as listed and agreed with the findings in the report. No results found.

## 2023-06-07 NOTE — Assessment & Plan Note (Signed)
 Chemotherapy plan as listed above

## 2023-06-07 NOTE — Assessment & Plan Note (Signed)
 Stable  monitor

## 2023-06-07 NOTE — Assessment & Plan Note (Signed)
#   IgG lamda multiple myeloma, status post autologous marrow transplant on 05/22/2022 08/04/2022 Bone marrow biopsy done at [facility] showed no plasma cell. - in remission.  Post transplant PET scan showed no hypermetabolic activity Labs are reviewed and discussed with patient. Counts are stable. Tolerate treatment well.  Continue Revlimid 10mg  daily  Recommend Aspirin 81mg  daily  Acyclovir daily for 1 year post transplant.

## 2023-06-07 NOTE — Assessment & Plan Note (Signed)
 Normal magnesium. continue slow mag 1 tab daily.

## 2023-06-07 NOTE — Assessment & Plan Note (Signed)
 continue potassium supplementation.

## 2023-06-07 NOTE — Assessment & Plan Note (Signed)
 08/21/22 PET showed no active disease.  dental clearance obtained.  Recommend Zometa Q3 months. . Recommend calcium and vitamin D supplementation

## 2023-06-08 ENCOUNTER — Other Ambulatory Visit: Payer: Self-pay

## 2023-06-08 LAB — KAPPA/LAMBDA LIGHT CHAINS
Kappa free light chain: 11.6 mg/L (ref 3.3–19.4)
Kappa, lambda light chain ratio: 1.02 (ref 0.26–1.65)
Lambda free light chains: 11.4 mg/L (ref 5.7–26.3)

## 2023-06-09 LAB — MULTIPLE MYELOMA PANEL, SERUM
Albumin SerPl Elph-Mcnc: 3.9 g/dL (ref 2.9–4.4)
Albumin/Glob SerPl: 1.4 (ref 0.7–1.7)
Alpha 1: 0.2 g/dL (ref 0.0–0.4)
Alpha2 Glob SerPl Elph-Mcnc: 0.6 g/dL (ref 0.4–1.0)
B-Globulin SerPl Elph-Mcnc: 1.3 g/dL (ref 0.7–1.3)
Gamma Glob SerPl Elph-Mcnc: 0.9 g/dL (ref 0.4–1.8)
Globulin, Total: 3 g/dL (ref 2.2–3.9)
IgA: 118 mg/dL (ref 90–386)
IgG (Immunoglobin G), Serum: 1070 mg/dL (ref 603–1613)
IgM (Immunoglobulin M), Srm: 38 mg/dL (ref 20–172)
Total Protein ELP: 6.9 g/dL (ref 6.0–8.5)

## 2023-06-15 ENCOUNTER — Other Ambulatory Visit: Payer: Self-pay

## 2023-06-19 ENCOUNTER — Other Ambulatory Visit: Payer: Self-pay

## 2023-06-20 IMAGING — MR MR LUMBAR SPINE W/O CM
5 series · 31 of 48 positions shown · non-contrast
Comparison: None.

CLINICAL DATA: Low back pain radiating to left leg

EXAM:
MRI LUMBAR SPINE WITHOUT CONTRAST
TECHNIQUE: Multiplanar, multisequence MR imaging of the lumbar spine was
performed. No intravenous contrast was administered.

[Series 9: T2 · sagittal · 4.0mm · 0.81mm/px · 6 of 17 slices shown (1 of 2)]
[im 1/17]
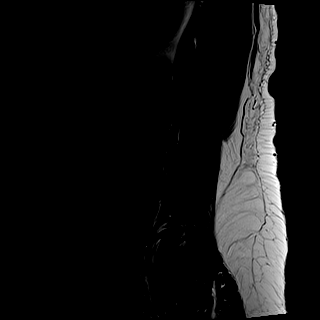
[im 4/17]
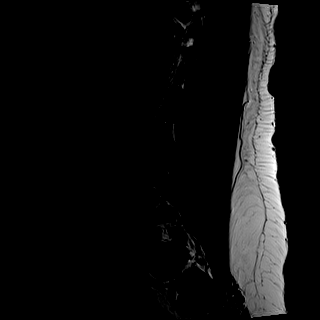
[im 7/17]
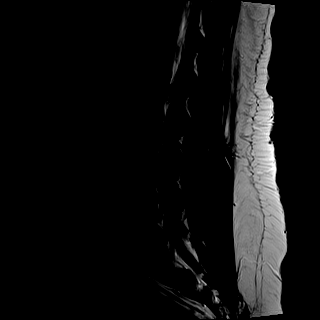
[im 10/17]
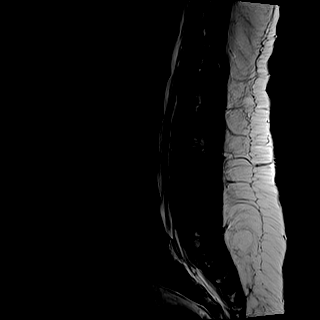
[im 13/17]
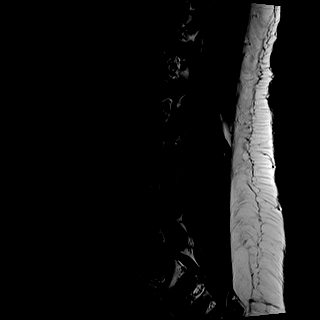
[im 17/17]
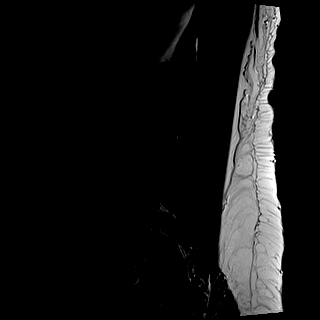

[Series 10: T1 · sagittal · 4.0mm · 0.81mm/px · 7 of 17 slices shown (1 of 2)]
[im 1/17]
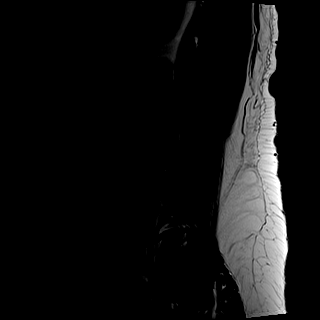
[im 3/17]
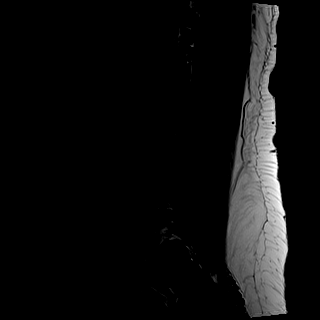
[im 6/17]
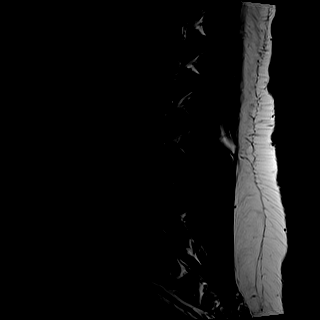
[im 9/17]
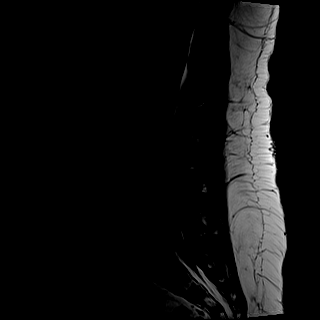
[im 11/17]
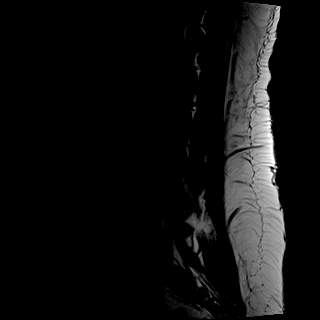
[im 14/17]
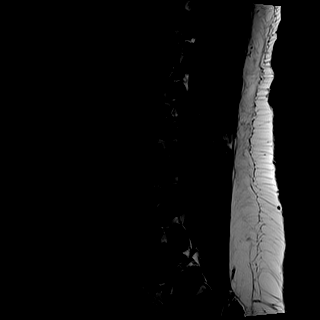
[im 17/17]
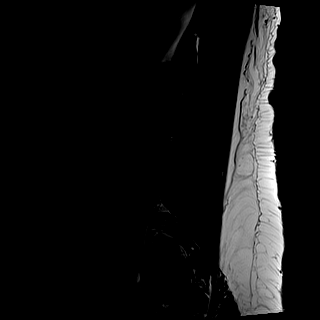

[Series 11: STIR · sagittal · 4.0mm · 0.41mm/px · 2 of 17 slices shown]
[im 1/17]
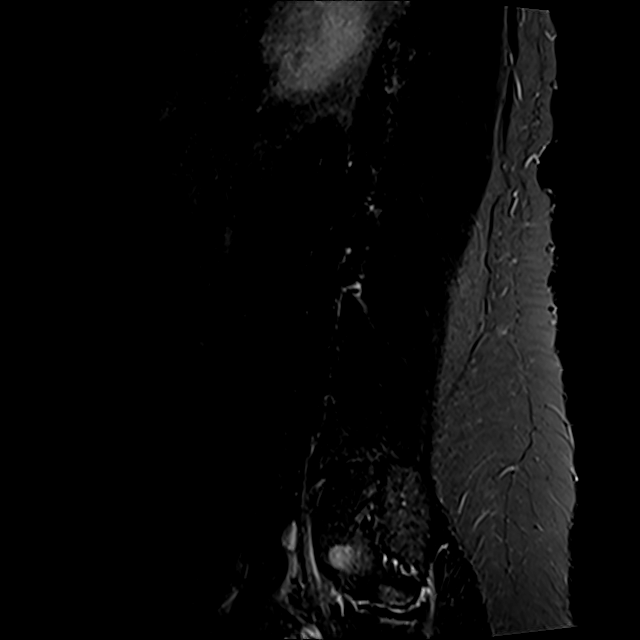
[im 3/17]
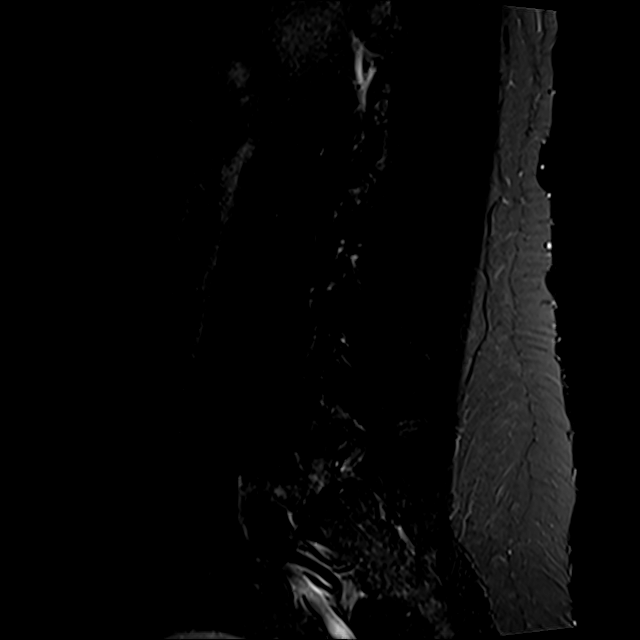

[Series 12: T2 · axial · 4.0mm · 0.78mm/px · z∈[-28,+195]mm · 8 of 37 slices shown (2 of 2)]
[im 1/37]
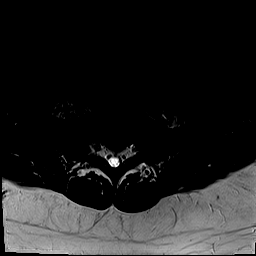
[im 6/37]
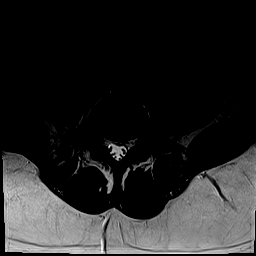
[im 12/37]
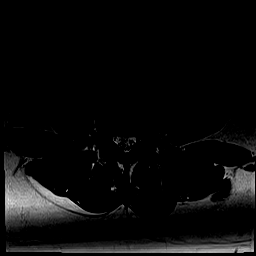
[im 17/37]
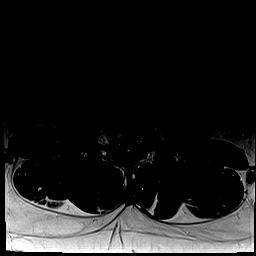
[im 20/37]
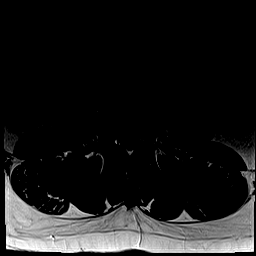
[im 25/37]
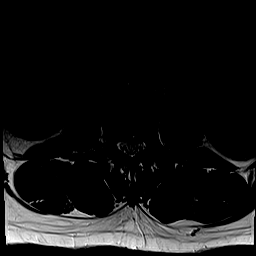
[im 31/37]
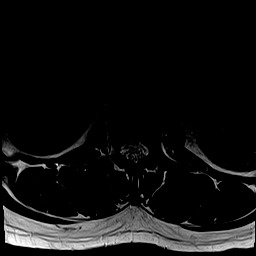
[im 37/37]
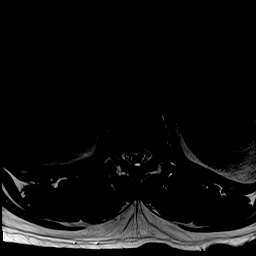

[Series 13: T1 · axial · 4.0mm · 0.39mm/px · z∈[-28,+195]mm · 8 of 37 slices shown (2 of 2)]
[im 1/37]
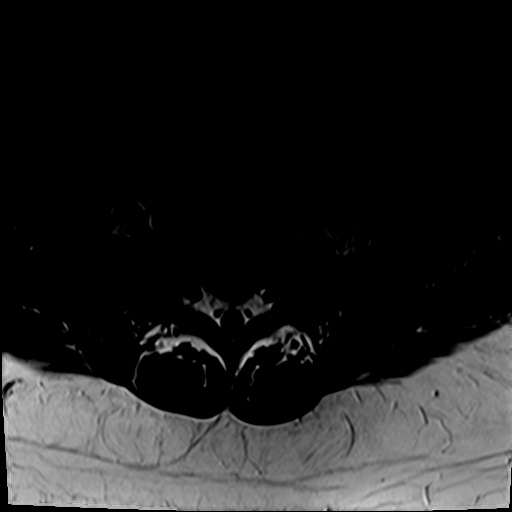
[im 6/37]
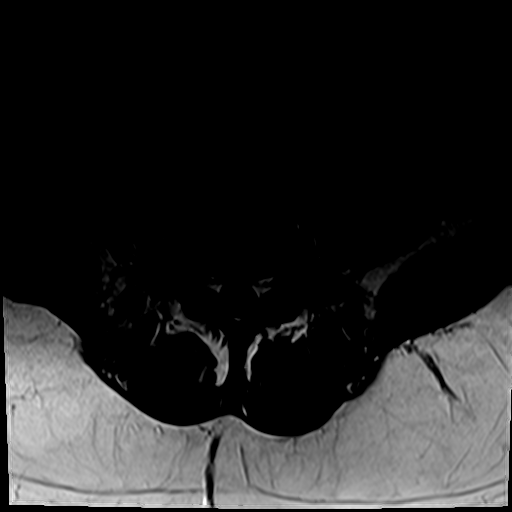
[im 12/37]
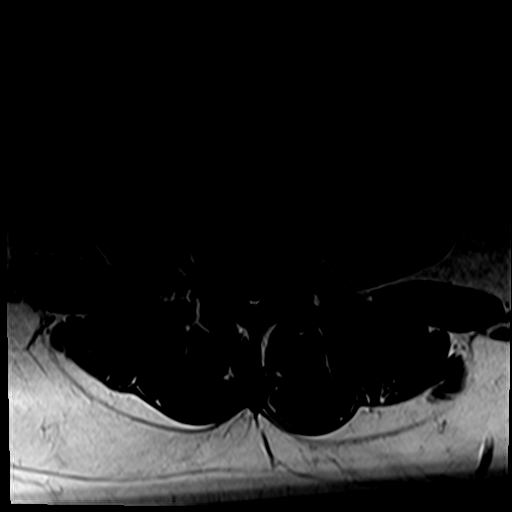
[im 17/37]
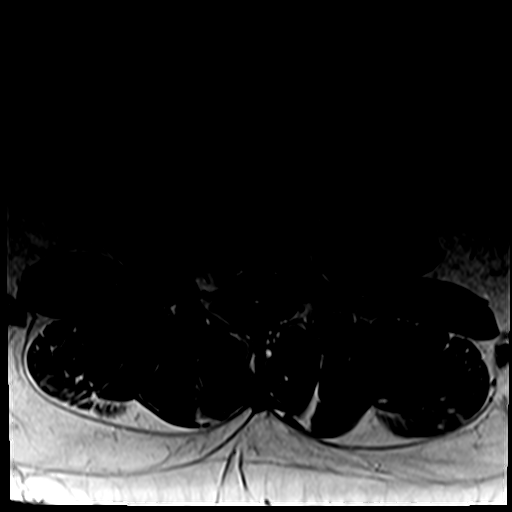
[im 20/37]
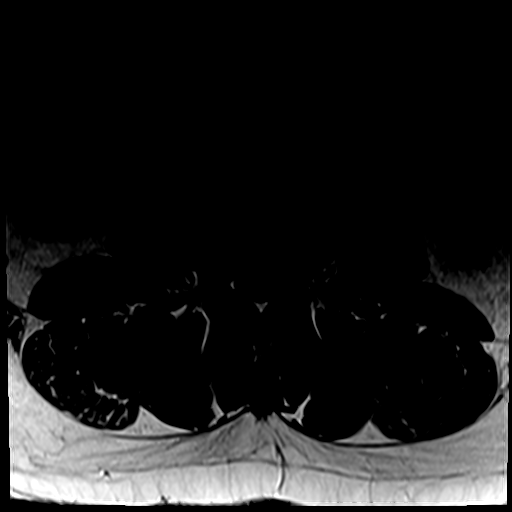
[im 25/37]
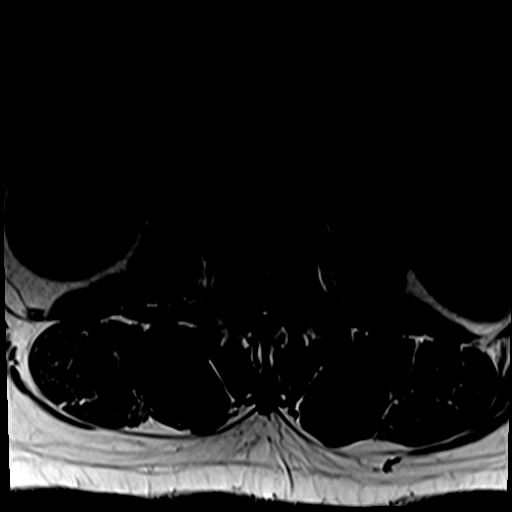
[im 31/37]
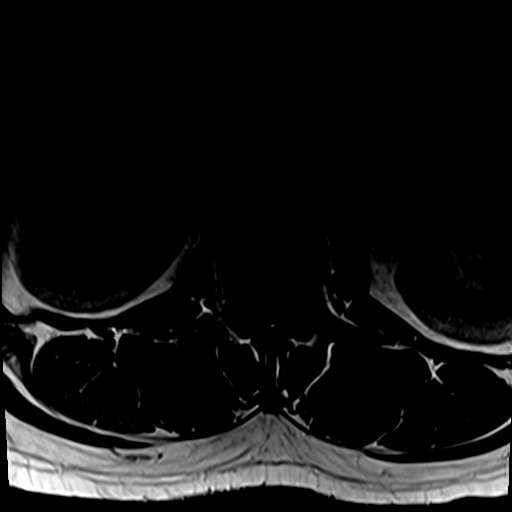
[im 37/37]
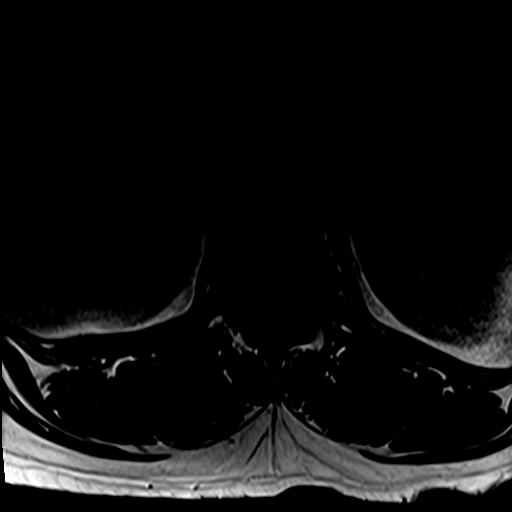

[31 of 48 positions shown; findings below may reference images not displayed]

FINDINGS: Segmentation:  Standard.

Alignment:  Physiologic.

Vertebrae:  No fracture, evidence of discitis, or bone lesion.

Conus medullaris and cauda equina: Conus extends to the L1 level.
Conus and cauda equina appear normal.

Paraspinal and other soft tissues: Negative

Disc levels:

At L5-S1, there is a small left subarticular disc extrusion with
minimal inferior migration. This contacts the left S1 nerve root in
the lateral recess. There is no central spinal canal stenosis. No
neural foraminal stenosis.
IMPRESSION: Small left subarticular disc extrusion at L5-S1 contacting the left
S1 nerve root in the lateral recess.

## 2023-06-29 ENCOUNTER — Other Ambulatory Visit: Payer: Self-pay | Admitting: Oncology

## 2023-06-29 DIAGNOSIS — C9000 Multiple myeloma not having achieved remission: Secondary | ICD-10-CM

## 2023-06-30 ENCOUNTER — Encounter: Payer: Self-pay | Admitting: Oncology

## 2023-06-30 ENCOUNTER — Other Ambulatory Visit: Payer: Self-pay

## 2023-07-02 ENCOUNTER — Other Ambulatory Visit: Payer: Self-pay

## 2023-07-05 ENCOUNTER — Inpatient Hospital Stay

## 2023-07-05 ENCOUNTER — Inpatient Hospital Stay (HOSPITAL_BASED_OUTPATIENT_CLINIC_OR_DEPARTMENT_OTHER): Admitting: Oncology

## 2023-07-05 ENCOUNTER — Encounter: Payer: Self-pay | Admitting: Oncology

## 2023-07-05 VITALS — BP 141/87 | HR 90 | Temp 98.8°F | Resp 18 | Wt 260.1 lb

## 2023-07-05 VITALS — BP 130/87 | HR 86 | Resp 18

## 2023-07-05 DIAGNOSIS — C9001 Multiple myeloma in remission: Secondary | ICD-10-CM

## 2023-07-05 DIAGNOSIS — E876 Hypokalemia: Secondary | ICD-10-CM

## 2023-07-05 DIAGNOSIS — C9000 Multiple myeloma not having achieved remission: Secondary | ICD-10-CM | POA: Diagnosis not present

## 2023-07-05 DIAGNOSIS — C7951 Secondary malignant neoplasm of bone: Secondary | ICD-10-CM

## 2023-07-05 DIAGNOSIS — Z5111 Encounter for antineoplastic chemotherapy: Secondary | ICD-10-CM

## 2023-07-05 DIAGNOSIS — E538 Deficiency of other specified B group vitamins: Secondary | ICD-10-CM

## 2023-07-05 LAB — CBC WITH DIFFERENTIAL (CANCER CENTER ONLY)
Abs Immature Granulocytes: 0.05 10*3/uL (ref 0.00–0.07)
Basophils Absolute: 0 10*3/uL (ref 0.0–0.1)
Basophils Relative: 1 %
Eosinophils Absolute: 0.2 10*3/uL (ref 0.0–0.5)
Eosinophils Relative: 4 %
HCT: 37.9 % — ABNORMAL LOW (ref 39.0–52.0)
Hemoglobin: 12.8 g/dL — ABNORMAL LOW (ref 13.0–17.0)
Immature Granulocytes: 1 %
Lymphocytes Relative: 24 %
Lymphs Abs: 1.3 10*3/uL (ref 0.7–4.0)
MCH: 27.6 pg (ref 26.0–34.0)
MCHC: 33.8 g/dL (ref 30.0–36.0)
MCV: 81.7 fL (ref 80.0–100.0)
Monocytes Absolute: 0.5 10*3/uL (ref 0.1–1.0)
Monocytes Relative: 9 %
Neutro Abs: 3.2 10*3/uL (ref 1.7–7.7)
Neutrophils Relative %: 61 %
Platelet Count: 215 10*3/uL (ref 150–400)
RBC: 4.64 MIL/uL (ref 4.22–5.81)
RDW: 13.9 % (ref 11.5–15.5)
WBC Count: 5.2 10*3/uL (ref 4.0–10.5)
nRBC: 0 % (ref 0.0–0.2)

## 2023-07-05 LAB — CMP (CANCER CENTER ONLY)
ALT: 35 U/L (ref 0–44)
AST: 39 U/L (ref 15–41)
Albumin: 4.1 g/dL (ref 3.5–5.0)
Alkaline Phosphatase: 68 U/L (ref 38–126)
Anion gap: 9 (ref 5–15)
BUN: 15 mg/dL (ref 6–20)
CO2: 23 mmol/L (ref 22–32)
Calcium: 9.6 mg/dL (ref 8.9–10.3)
Chloride: 103 mmol/L (ref 98–111)
Creatinine: 1.05 mg/dL (ref 0.61–1.24)
GFR, Estimated: >60 mL/min (ref >60)
Glucose, Bld: 122 mg/dL — ABNORMAL HIGH (ref 70–99)
Potassium: 3.8 mmol/L (ref 3.5–5.1)
Sodium: 135 mmol/L (ref 135–145)
Total Bilirubin: 0.6 mg/dL (ref 0.0–1.2)
Total Protein: 7.2 g/dL (ref 6.5–8.1)

## 2023-07-05 LAB — MAGNESIUM: Magnesium: 1.8 mg/dL (ref 1.7–2.4)

## 2023-07-05 MED ORDER — ZOLEDRONIC ACID 4 MG/100ML IV SOLN
4.0000 mg | Freq: Once | INTRAVENOUS | Status: AC
  Administered 2023-07-05: 4 mg via INTRAVENOUS
  Filled 2023-07-05: qty 100

## 2023-07-05 NOTE — Patient Instructions (Signed)

## 2023-07-05 NOTE — Assessment & Plan Note (Addendum)
 Normal magnesium  level .  He has been off Slow-Mag.  Monitor magnesium  level monthly.

## 2023-07-05 NOTE — Progress Notes (Signed)
 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 - in remission.  Post transplant PET scan showed no hypermetabolic activity Labs are reviewed and discussed with patient. Counts are stable. Tolerate treatment well.  Continue Revlimid  10mg  daily  Recommend Aspirin  81mg  daily  He has finished Acyclovir  daily for 1 year post transplant, advise pt to stop acyclovir   Hypomagnesemia Normal magnesium  level .  He has been off Slow-Mag.  Monitor magnesium  level monthly.   Metastatic cancer to bone (HCC) 08/21/22 PET showed no active disease.  dental clearance obtained.  Recommend Zometa  Q3 months. . Recommend calcium  and vitamin D supplementation      Encounter for antineoplastic chemotherapy Chemotherapy plan as listed above.   Hypokalemia Potassium has been stable and he has been taking potassium 20 mEq on and off.   Recommend to potassium 20meq 2-3 times per week..   Follow-up in 4 weeks All questions were answered. The patient knows to call the clinic with any problems, questions or concerns. No barriers to learning was detected.  Call Babara, MD 07/05/2023    HISTORY OF PRESENTING ILLNESS:  Tanner [name] 38 y.o. male presents  for follow up of Multiple myeloma I have reviewed his chart and materials related to his cancer extensively  and collaborated history with the patient. Summary of oncologic history is as follows: Oncology History  Multiple myeloma in remission (HCC)  08/14/2021 Imaging   CT chest abdomen pelvis without contrast Showed numerous lucencies throughout the bone structures, most pronounced throughout the spine and left left iliac bone.  Otherwise no acute findings in the chest abdomen or pelvis.  Sigmoid diverticulosis.   08/19/2021 Initial Diagnosis   Multiple myeloma not having achieved remission, stage II  -08/15/2021, bone marrow biopsy showed plasma cell myeloma involving greater than 80% of the marrow. Cytogenetics Myeloma  FISH   08/20/2021 Cancer Staging   Staging form: Plasma Cell Myeloma and Plasma Cell Disorders, AJCC 8th Edition - Clinical stage from 08/20/2021: RISS Stage II (Beta-2 -microglobulin (mg/L): 3.6, Albumin (g/dL): 2.2, ISS: Stage II, High-risk cytogenetics: Absent, LDH: Normal) - Signed by Babara Call, MD on 10/14/2021 Beta 2 microglobulin range (mg/L): 3.5 to 5.49 Albumin range (g/dL): Less than 3.5 Cytogenetics: No abnormalities   08/20/2021 -  Chemotherapy   MYELOMA NEWLY DIAGNOSED TRANSPLANT CANDIDATE DaraVRd (Daratumumab  SUBQ) q21d x 6 Cycles (Induction/Consolidation)      09/11/2021 Imaging   PET scan showed  1. Extensive multifocal hypermetabolic disease throughout the axial and appendicular skeleton, with numerous lucent osseous lesions,consistent with patient's known diagnosis of multiple myeloma. 2. No tracer avid lymph node or mass identified to suggest soft tissue plasmacytoma   12/18/2021 Imaging   MRI lumbar 1. Multiple scattered enhancing marrow replacing bone lesions within the imaged spine and pelvis consistent with known history of multiple myeloma. No pathologic fracture or extraosseous soft tissue component. 2. Unchanged mild multilevel degenerative changes of the lumbar spine resulting in mild canal stenosis at  L2-L3 and L3-L4.  3. Left greater than right  subarticular recess stenosis and mild left foraminal stenosis at L5-S1.      02/25/2022 Imaging   PET  1. Decreased FDG avidity associated with the multifocal hypermetabolic osseous lesions, consistent with treatment response. 2. Unchanged pathologic fracture of the T8 superior endplate. 3. Prominent bilateral cervical lymph nodes are similar in size to prior but now demonstrate mildly metabolic activity, nonspecific but favored to be reactive. Attention on follow-up imaging suggested 4. Hypermetabolic hyperplasia of the tonsils, also favored reactive.   05/22/2022 Bone Marrow Transplant   Patient underwent autologous stem cell transplant. 05/30/2022, neutropenic fever.   06/03/2022 engraftment 06/04/2022, G-CSF stopped.    08/21/2022 Imaging   PET  No evidence of hypermetabolic myeloma.      INTERVAL HISTORY Tanner [name] is a 38 y.o. male who has above history reviewed by me today presents for follow up visit for management of multiple myeloma Patient is status post autologous stem cell transplant.   He is doing well.he takes Revlimid  10mg  daily. He tolerates well. He denies fever, chills, nausea vomiting diarrhea. He gets immunization [facility].    MEDICAL HISTORY:  Past Medical History:  Diagnosis Date   Alcohol use    Sciatica    Smoking     SURGICAL HISTORY: Past Surgical History:  Procedure Laterality Date   INGUINAL HERNIA REPAIR      SOCIAL HISTORY: Social History   Socioeconomic History   Marital status: Single    Spouse name: Not on file   Number of children: Not on file   Years of education: Not on file   Highest education level: Not on file  Occupational History   Not on file  Tobacco Use   Smoking status: Former    Current packs/day: 0.00    Average packs/day: 0.5 packs/day for 10.0 years (5.0 ttl pk-yrs)    Types: E-cigarettes, Cigarettes    Start date: 08/12/2011    Quit date: 08/11/2021    Years since quitting: 1.8   Smokeless tobacco:  Never  Vaping Use   Vaping status: Every Day  Substance and Sexual Activity   Alcohol use: Not Currently    Alcohol/week: 24.0 standard drinks of alcohol    Types: 24 Cans of beer per week    Comment: quit drinking approx 08/11/2021   Drug use: Never   Sexual activity: Not on file  Other Topics Concern   Not on file  Social History Narrative   Not on file   Social Drivers of Health   Financial Resource Strain: Low Risk  (05/30/2022)   Received from [facility]   Overall Financial Resource Strain (CARDIA)    Difficulty of Paying Living Expenses: Not hard at all  Food Insecurity: No Food Insecurity (05/30/2022)   Received from [facility]   Hunger Vital Sign    Within the past 12 months, you worried that your food would run out before you got the money to buy more.: Never true    Within the past 12 months, the food you bought just didn't last and you didn't have money to get more.: Never true  Transportation Needs: No Transportation Needs (06/01/2022)   Received from [facility] - Transportation    In the past 12 months, has lack of transportation kept you from medical appointments or from getting medications?: No    Lack of Transportation (Non-Medical): No  Physical Activity: Not on  file  Stress: Stress Concern Present (01/13/2022)   Received from [facility] - Occupational Stress Questionnaire    Feeling of Stress : To some extent  Social Connections: Not on file  Intimate Partner Violence: Not on file    FAMILY HISTORY: Family History  Problem Relation Age of Onset   Ovarian cancer Mother    Asthma Mother    Alcoholism Father    Throat cancer Maternal Uncle    Heart disease Maternal Uncle    Lung cancer Maternal Uncle    Lymphoma Maternal Uncle    Lung cancer Paternal Grandmother     ALLERGIES:  has no known allergies.  MEDICATIONS:  Current  Outpatient Medications  Medication Sig Dispense Refill   aspirin  EC 81 MG tablet Take 81 mg by mouth daily. Swallow whole.     CALCIUM /VITAMIN D3 GUMMIES 200-5 MG-MCG CHEW TAKE 2 TABLETS BY MOUTH EVERY DAY 60 tablet 5   Cyanocobalamin  1000 MCG SUBL Take 1,000 mcg by mouth.     lenalidomide  (REVLIMID ) 10 MG capsule TAKE 1 CAPSULE BY MOUTH 1 TIME A DAY 28 capsule 0   ondansetron  (ZOFRAN ) 8 MG tablet Take 1 tablet (8 mg total) by mouth 2 (two) times daily as needed for nausea or vomiting. 20 tablet 2   potassium chloride  SA (KLOR-CON  M20) 20 MEQ tablet Take 1 tablet (20 mEq total) by mouth daily. 90 tablet 0   senna-docusate (SENOKOT-S) 8.6-50 MG tablet Take 1 tablet by mouth daily. 60 tablet 2   No current facility-administered medications for this visit.    Review of Systems  Constitutional:  Negative for appetite change, chills, fatigue, fever and unexpected weight change.  HENT:   Negative for hearing loss, sore throat and voice change.   Eyes:  Negative for eye problems and icterus.  Respiratory:  Negative for chest tightness, cough and shortness of breath.   Cardiovascular:  Negative for chest pain and leg swelling.  Gastrointestinal:  Negative for abdominal distention and abdominal pain.  Endocrine: Negative for hot flashes.  Genitourinary:  Negative for difficulty urinating, dysuria and frequency.   Musculoskeletal:  Negative for arthralgias and back pain.  Skin:  Negative for itching and rash.  Neurological:  Negative for light-headedness and numbness.  Hematological:  Negative for adenopathy. Does not bruise/bleed easily.  Psychiatric/Behavioral:  Negative for confusion.      PHYSICAL EXAMINATION: ECOG PERFORMANCE STATUS: 1 - Symptomatic but completely ambulatory  Vitals:   07/05/23 0951  BP: (!) 141/87  Pulse: 90  Resp: 18  Temp: 98.8 F (37.1 C)  SpO2: 100%   Filed Weights   07/05/23 0951  Weight: 260 lb 1.6 oz (118 kg)     Physical Exam Constitutional:       General: He is not in acute distress.    Appearance: He is obese. He is not diaphoretic.  HENT:     Head: Normocephalic and atraumatic.     Mouth/Throat:     Pharynx: No oropharyngeal exudate.   Eyes:     General: No scleral icterus.   Cardiovascular:     Rate and Rhythm: Normal rate.     Heart sounds: No murmur heard. Pulmonary:     Effort: Pulmonary effort is normal. No respiratory distress.  Abdominal:     General: There is no distension.     Palpations: Abdomen is soft.   Musculoskeletal:        General: Normal range of motion.  Cervical back: Normal range of motion.   Skin:    General: Skin is warm and dry.     Findings: No erythema.   Neurological:     Mental Status: He is alert and oriented to person, place, and time. Mental status is at baseline.     Cranial Nerves: No cranial nerve deficit.     Motor: No abnormal muscle tone.   Psychiatric:        Mood and Affect: Mood and affect normal.      LABORATORY DATA:  I have reviewed the data as listed    Latest Ref Rng & Units 07/05/2023    9:38 AM 06/07/2023    9:31 AM 05/03/2023    9:41 AM  CBC  WBC 4.0 - 10.5 K/uL 5.2  4.4  6.1   Hemoglobin 13.0 - 17.0 g/dL 87.1  87.9  87.5   Hematocrit 39.0 - 52.0 % 37.9  35.9  36.4   Platelets 150 - 400 K/uL 215  224  228       Latest Ref Rng & Units 07/05/2023    9:38 AM 06/07/2023    9:31 AM 05/03/2023    9:42 AM  CMP  Glucose 70 - 99 mg/dL 877  880  92   BUN 6 - 20 mg/dL 15  15  16    Creatinine 0.61 - 1.24 mg/dL 8.94  8.85  8.95   Sodium 135 - 145 mmol/L 135  135  134   Potassium 3.5 - 5.1 mmol/L 3.8  3.8  3.5   Chloride 98 - 111 mmol/L 103  104  100   CO2 22 - 32 mmol/L 23  20  23    Calcium  8.9 - 10.3 mg/dL 9.6  9.0  9.3   Total Protein 6.5 - 8.1 g/dL 7.2  7.3  7.6   Total Bilirubin 0.0 - 1.2 mg/dL 0.6  0.7  0.8   Alkaline Phos 38 - 126 U/L 68  67  69   AST 15 - 41 U/L 39  23  29   ALT 0 - 44 U/L 35  26  29        RADIOGRAPHIC STUDIES: I have  personally reviewed the radiological images as listed and agreed with the findings in the report. No results found.

## 2023-07-05 NOTE — Assessment & Plan Note (Signed)
 Chemotherapy plan as listed above

## 2023-07-05 NOTE — Assessment & Plan Note (Signed)
 08/21/22 PET showed no active disease.  dental clearance obtained.  Recommend Zometa Q3 months. . Recommend calcium and vitamin D supplementation

## 2023-07-05 NOTE — Assessment & Plan Note (Signed)
 Potassium has been stable and he has been taking potassium 20 mEq on and off.   Recommend to potassium 20meq 2-3 times per week.SABRA

## 2023-07-05 NOTE — Assessment & Plan Note (Addendum)
#   IgG lamda multiple myeloma, status post autologous marrow transplant on 05/22/2022 08/04/2022 Bone marrow biopsy done at [facility] showed no plasma cell. - in remission.  Post transplant PET scan showed no hypermetabolic activity Labs are reviewed and discussed with patient. Counts are stable. Tolerate treatment well.  Continue Revlimid  10mg  daily  Recommend Aspirin  81mg  daily  He has finished Acyclovir  daily for 1 year post transplant, advise pt to stop acyclovir

## 2023-07-06 ENCOUNTER — Other Ambulatory Visit: Payer: Self-pay

## 2023-07-06 LAB — KAPPA/LAMBDA LIGHT CHAINS
Kappa free light chain: 10.8 mg/L (ref 3.3–19.4)
Kappa, lambda light chain ratio: 0.87 (ref 0.26–1.65)
Lambda free light chains: 12.4 mg/L (ref 5.7–26.3)

## 2023-07-07 LAB — MULTIPLE MYELOMA PANEL, SERUM
Albumin SerPl Elph-Mcnc: 4.1 g/dL (ref 2.9–4.4)
Albumin/Glob SerPl: 1.5 (ref 0.7–1.7)
Alpha 1: 0.1 g/dL (ref 0.0–0.4)
Alpha2 Glob SerPl Elph-Mcnc: 0.6 g/dL (ref 0.4–1.0)
B-Globulin SerPl Elph-Mcnc: 1.3 g/dL (ref 0.7–1.3)
Gamma Glob SerPl Elph-Mcnc: 0.9 g/dL (ref 0.4–1.8)
Globulin, Total: 2.9 g/dL (ref 2.2–3.9)
IgA: 113 mg/dL (ref 90–386)
IgG (Immunoglobin G), Serum: 1093 mg/dL (ref 603–1613)
IgM (Immunoglobulin M), Srm: 37 mg/dL (ref 20–172)
Total Protein ELP: 7 g/dL (ref 6.0–8.5)

## 2023-07-29 ENCOUNTER — Other Ambulatory Visit: Payer: Self-pay | Admitting: Oncology

## 2023-07-29 DIAGNOSIS — C9000 Multiple myeloma not having achieved remission: Secondary | ICD-10-CM

## 2023-08-04 ENCOUNTER — Inpatient Hospital Stay

## 2023-08-04 ENCOUNTER — Inpatient Hospital Stay: Admitting: Oncology

## 2023-08-04 ENCOUNTER — Other Ambulatory Visit: Payer: Self-pay

## 2023-08-05 ENCOUNTER — Encounter: Payer: Self-pay | Admitting: Oncology

## 2023-08-06 ENCOUNTER — Inpatient Hospital Stay: Attending: Oncology

## 2023-08-06 ENCOUNTER — Encounter: Payer: Self-pay | Admitting: Oncology

## 2023-08-06 ENCOUNTER — Ambulatory Visit: Admitting: Oncology

## 2023-08-06 VITALS — BP 119/81 | HR 84 | Temp 97.0°F | Resp 18 | Wt 260.4 lb

## 2023-08-06 DIAGNOSIS — Z8041 Family history of malignant neoplasm of ovary: Secondary | ICD-10-CM | POA: Insufficient documentation

## 2023-08-06 DIAGNOSIS — C9001 Multiple myeloma in remission: Secondary | ICD-10-CM | POA: Diagnosis present

## 2023-08-06 DIAGNOSIS — Z5111 Encounter for antineoplastic chemotherapy: Secondary | ICD-10-CM

## 2023-08-06 DIAGNOSIS — Z801 Family history of malignant neoplasm of trachea, bronchus and lung: Secondary | ICD-10-CM | POA: Insufficient documentation

## 2023-08-06 DIAGNOSIS — Z807 Family history of other malignant neoplasms of lymphoid, hematopoietic and related tissues: Secondary | ICD-10-CM | POA: Diagnosis not present

## 2023-08-06 DIAGNOSIS — Z79899 Other long term (current) drug therapy: Secondary | ICD-10-CM | POA: Diagnosis not present

## 2023-08-06 DIAGNOSIS — F17290 Nicotine dependence, other tobacco product, uncomplicated: Secondary | ICD-10-CM | POA: Insufficient documentation

## 2023-08-06 DIAGNOSIS — C7951 Secondary malignant neoplasm of bone: Secondary | ICD-10-CM | POA: Diagnosis present

## 2023-08-06 DIAGNOSIS — E876 Hypokalemia: Secondary | ICD-10-CM | POA: Insufficient documentation

## 2023-08-06 DIAGNOSIS — D649 Anemia, unspecified: Secondary | ICD-10-CM | POA: Diagnosis not present

## 2023-08-06 DIAGNOSIS — Z808 Family history of malignant neoplasm of other organs or systems: Secondary | ICD-10-CM | POA: Diagnosis not present

## 2023-08-06 DIAGNOSIS — E538 Deficiency of other specified B group vitamins: Secondary | ICD-10-CM

## 2023-08-06 LAB — CBC WITH DIFFERENTIAL (CANCER CENTER ONLY)
Abs Immature Granulocytes: 0.02 K/uL (ref 0.00–0.07)
Basophils Absolute: 0 K/uL (ref 0.0–0.1)
Basophils Relative: 0 %
Eosinophils Absolute: 0.2 K/uL (ref 0.0–0.5)
Eosinophils Relative: 4 %
HCT: 34.6 % — ABNORMAL LOW (ref 39.0–52.0)
Hemoglobin: 11.8 g/dL — ABNORMAL LOW (ref 13.0–17.0)
Immature Granulocytes: 0 %
Lymphocytes Relative: 25 %
Lymphs Abs: 1.3 K/uL (ref 0.7–4.0)
MCH: 27.8 pg (ref 26.0–34.0)
MCHC: 34.1 g/dL (ref 30.0–36.0)
MCV: 81.6 fL (ref 80.0–100.0)
Monocytes Absolute: 0.7 K/uL (ref 0.1–1.0)
Monocytes Relative: 13 %
Neutro Abs: 2.9 K/uL (ref 1.7–7.7)
Neutrophils Relative %: 58 %
Platelet Count: 222 K/uL (ref 150–400)
RBC: 4.24 MIL/uL (ref 4.22–5.81)
RDW: 14 % (ref 11.5–15.5)
WBC Count: 5.1 K/uL (ref 4.0–10.5)
nRBC: 0 % (ref 0.0–0.2)

## 2023-08-06 LAB — CMP (CANCER CENTER ONLY)
ALT: 42 U/L (ref 0–44)
AST: 36 U/L (ref 15–41)
Albumin: 4.1 g/dL (ref 3.5–5.0)
Alkaline Phosphatase: 61 U/L (ref 38–126)
Anion gap: 9 (ref 5–15)
BUN: 11 mg/dL (ref 6–20)
CO2: 22 mmol/L (ref 22–32)
Calcium: 8.9 mg/dL (ref 8.9–10.3)
Chloride: 103 mmol/L (ref 98–111)
Creatinine: 1.07 mg/dL (ref 0.61–1.24)
GFR, Estimated: >60 mL/min (ref >60)
Glucose, Bld: 105 mg/dL — ABNORMAL HIGH (ref 70–99)
Potassium: 3.7 mmol/L (ref 3.5–5.1)
Sodium: 134 mmol/L — ABNORMAL LOW (ref 135–145)
Total Bilirubin: 0.8 mg/dL (ref 0.0–1.2)
Total Protein: 7.1 g/dL (ref 6.5–8.1)

## 2023-08-06 LAB — VITAMIN B12: Vitamin B-12: 217 pg/mL (ref 180–914)

## 2023-08-06 LAB — MAGNESIUM: Magnesium: 2.1 mg/dL (ref 1.7–2.4)

## 2023-08-06 NOTE — Progress Notes (Signed)
 Hematology/Oncology Progress note Telephone:([phone] Fax:([phone]     CHIEF COMPLAINTS/PURPOSE OF CONSULTATION:  Multiple myeloma  ASSESSMENT & PLAN:   Cancer Staging  Multiple myeloma in remission [facility]) Staging form: Plasma Cell Myeloma and Plasma Cell Disorders, AJCC 8th Edition - Clinical stage from 08/20/2021: RISS Stage II (Beta-2 -microglobulin (mg/L): 3.6, Albumin (g/dL): 2.2, ISS: Stage II, High-risk cytogenetics: Absent, LDH: Normal) - Signed by Babara Call, MD on 10/14/2021   Multiple myeloma in remission (HCC) # IgG lamda multiple myeloma, status post autologous marrow transplant on 05/22/2022 08/04/2022 Bone marrow biopsy done at [facility] showed no plasma cell. - in remission.  Post transplant PET scan showed no hypermetabolic activity Labs are reviewed and discussed with patient. Counts are stable. Tolerate treatment well.  Continue Revlimid  10mg  daily  Recommend Aspirin  81mg  daily  finished Acyclovir  daily for 1 year post transplant, off acyclovir   Encounter for antineoplastic chemotherapy Chemotherapy plan as listed above.   Hypokalemia Potassium has been stable and he has been taking potassium 20 mEq on and off.   Recommend to potassium 20meq 2-3 times per week.SABRA   Hypomagnesemia Normal magnesium  level .  He has been off Slow-Mag.  Monitor magnesium  level monthly.   Metastatic cancer to bone (HCC) 08/21/22 PET showed no active disease.  dental clearance obtained.  Recommend Zometa  Q3 months. . Recommend calcium  and vitamin D supplementation      Normocytic anemia Stable monitor.    Follow-up in 4 weeks All questions were answered. The patient knows to call the clinic with any problems, questions or concerns. No barriers to learning was detected.  Call Babara, MD 08/06/2023    HISTORY OF PRESENTING ILLNESS:  Tanner [name] 38 y.o. male presents  for follow up of Multiple myeloma I have reviewed his chart and materials related to his  cancer extensively and collaborated history with the patient. Summary of oncologic history is as follows: Oncology History  Multiple myeloma in remission (HCC)  08/14/2021 Imaging   CT chest abdomen pelvis without contrast Showed numerous lucencies throughout the bone structures, most pronounced throughout the spine and left left iliac bone.  Otherwise no acute findings in the chest abdomen or pelvis.  Sigmoid diverticulosis.   08/19/2021 Initial Diagnosis   Multiple myeloma not having achieved remission, stage II  -08/15/2021, bone marrow biopsy showed plasma cell myeloma involving greater than 80% of the marrow. Cytogenetics Myeloma  FISH   08/20/2021 Cancer Staging   Staging form: Plasma Cell Myeloma and Plasma Cell Disorders, AJCC 8th Edition - Clinical stage from 08/20/2021: RISS Stage II (Beta-2 -microglobulin (mg/L): 3.6, Albumin (g/dL): 2.2, ISS: Stage II, High-risk cytogenetics: Absent, LDH: Normal) - Signed by Babara Call, MD on 10/14/2021 Beta 2 microglobulin range (mg/L): 3.5 to 5.49 Albumin range (g/dL): Less than 3.5 Cytogenetics: No abnormalities   08/20/2021 -  Chemotherapy   MYELOMA NEWLY DIAGNOSED TRANSPLANT CANDIDATE DaraVRd (Daratumumab  SUBQ) q21d x 6 Cycles (Induction/Consolidation)      09/11/2021 Imaging   PET scan showed  1. Extensive multifocal hypermetabolic disease throughout the axial and appendicular skeleton, with numerous lucent osseous lesions,consistent with patient's known diagnosis of multiple myeloma. 2. No tracer avid lymph node or mass identified to suggest soft tissue plasmacytoma   12/18/2021 Imaging   MRI lumbar 1. Multiple scattered enhancing marrow replacing bone lesions within the imaged spine and pelvis consistent with known history of multiple myeloma. No pathologic fracture or extraosseous soft tissue component. 2. Unchanged mild multilevel degenerative changes of the lumbar spine resulting in mild canal  stenosis at L2-L3 and L3-L4.  3. Left  greater than right subarticular recess stenosis and mild left foraminal stenosis at L5-S1.      02/25/2022 Imaging   PET  1. Decreased FDG avidity associated with the multifocal hypermetabolic osseous lesions, consistent with treatment response. 2. Unchanged pathologic fracture of the T8 superior endplate. 3. Prominent bilateral cervical lymph nodes are similar in size to prior but now demonstrate mildly metabolic activity, nonspecific but favored to be reactive. Attention on follow-up imaging suggested 4. Hypermetabolic hyperplasia of the tonsils, also favored reactive.   05/22/2022 Bone Marrow Transplant   Patient underwent autologous stem cell transplant. 05/30/2022, neutropenic fever.   06/03/2022 engraftment 06/04/2022, G-CSF stopped.    08/21/2022 Imaging   PET  No evidence of hypermetabolic myeloma.      INTERVAL HISTORY Tanner [name] is a 38 y.o. male who has above history reviewed by me today presents for follow up visit for management of multiple myeloma Patient is status post autologous stem cell transplant.   He is doing well.he takes Revlimid  10mg  daily. He tolerates well. He denies fever, chills, nausea vomiting diarrhea. He [facility] immunization [facility].    MEDICAL HISTORY:  Past Medical History:  Diagnosis Date   Alcohol use    Sciatica    Smoking     SURGICAL HISTORY: Past Surgical History:  Procedure Laterality Date   INGUINAL HERNIA REPAIR      SOCIAL HISTORY: Social History   Socioeconomic History   Marital status: Single    Spouse name: Not on file   Number of children: Not on file   Years of education: Not on file   Highest education level: Not on file  Occupational History   Not on file  Tobacco Use   Smoking status: Former    Current packs/day: 0.00    Average packs/day: 0.5 packs/day for 10.0 years (5.0 ttl pk-yrs)    Types: E-cigarettes, Cigarettes    Start date: 08/12/2011    Quit date: 08/11/2021    Years since quitting: 1.9    Smokeless tobacco: Never  Vaping Use   Vaping status: Every Day  Substance and Sexual Activity   Alcohol use: Not Currently    Alcohol/week: 24.0 standard drinks of alcohol    Types: 24 Cans of beer per week    Comment: quit drinking approx 08/11/2021   Drug use: Never   Sexual activity: Not on file  Other Topics Concern   Not on file  Social History Narrative   Not on file   Social Drivers of Health   Financial Resource Strain: Low Risk  (05/30/2022)   Received from [facility]   Overall Financial Resource Strain (CARDIA)    Difficulty of Paying Living Expenses: Not hard at all  Food Insecurity: No Food Insecurity (05/30/2022)   Received from [facility]   Hunger Vital Sign    Within the past 12 months, you worried that your food would run out before you got the money to buy more.: Never true    Within the past 12 months, the food you bought just didn't last and you didn't have money to get more.: Never true  Transportation Needs: No Transportation Needs (06/01/2022)   Received from [facility] - Transportation    In the past 12 months, has lack of transportation kept you from medical appointments or from getting medications?: No    Lack of Transportation (Non-Medical): No  Physical Activity:  Not on file  Stress: Stress Concern Present (01/13/2022)   Received from [facility] - Occupational Stress Questionnaire    Feeling of Stress : To some extent  Social Connections: Not on file  Intimate Partner Violence: Not on file    FAMILY HISTORY: Family History  Problem Relation Age of Onset   Ovarian cancer Mother    Asthma Mother    Alcoholism Father    Throat cancer Maternal Uncle    Heart disease Maternal Uncle    Lung cancer Maternal Uncle    Lymphoma Maternal Uncle    Lung cancer Paternal Grandmother     ALLERGIES:  has no known  allergies.  MEDICATIONS:  Current Outpatient Medications  Medication Sig Dispense Refill   aspirin  EC 81 MG tablet Take 81 mg by mouth daily. Swallow whole.     CALCIUM /VITAMIN D3 GUMMIES 200-5 MG-MCG CHEW TAKE 2 TABLETS BY MOUTH EVERY DAY 60 tablet 5   Cyanocobalamin  1000 MCG SUBL Take 1,000 mcg by mouth.     lenalidomide  (REVLIMID ) 10 MG capsule TAKE 1 CAPSULE BY MOUTH 1 TIME A DAY 28 capsule 0   ondansetron  (ZOFRAN ) 8 MG tablet Take 1 tablet (8 mg total) by mouth 2 (two) times daily as needed for nausea or vomiting. 20 tablet 2   potassium chloride  SA (KLOR-CON  M20) 20 MEQ tablet Take 1 tablet (20 mEq total) by mouth daily. 90 tablet 0   senna-docusate (SENOKOT-S) 8.6-50 MG tablet Take 1 tablet by mouth daily. 60 tablet 2   No current facility-administered medications for this visit.    Review of Systems  Constitutional:  Negative for appetite change, chills, fatigue, fever and unexpected weight change.  HENT:   Negative for hearing loss, sore throat and voice change.   Eyes:  Negative for eye problems and icterus.  Respiratory:  Negative for chest tightness, cough and shortness of breath.   Cardiovascular:  Negative for chest pain and leg swelling.  Gastrointestinal:  Negative for abdominal distention and abdominal pain.  Endocrine: Negative for hot flashes.  Genitourinary:  Negative for difficulty urinating, dysuria and frequency.   Musculoskeletal:  Negative for arthralgias and back pain.  Skin:  Negative for itching and rash.  Neurological:  Negative for light-headedness and numbness.  Hematological:  Negative for adenopathy. Does not bruise/bleed easily.  Psychiatric/Behavioral:  Negative for confusion.      PHYSICAL EXAMINATION: ECOG PERFORMANCE STATUS: 1 - Symptomatic but completely ambulatory  Vitals:   08/06/23 0917  BP: 119/81  Pulse: 84  Resp: 18  Temp: (!) 97 F (36.1 C)  SpO2: 100%   Filed Weights   08/06/23 0917  Weight: 260 lb 6.4 oz (118.1 kg)      Physical Exam Constitutional:      General: He is not in acute distress.    Appearance: He is obese. He is not diaphoretic.  HENT:     Head: Normocephalic and atraumatic.     Mouth/Throat:     Pharynx: No oropharyngeal exudate.  Eyes:     General: No scleral icterus. Cardiovascular:     Rate and Rhythm: Normal rate.     Heart sounds: No murmur heard. Pulmonary:     Effort: Pulmonary effort is normal. No respiratory distress.  Abdominal:     General: There is no distension.     Palpations: Abdomen is soft.  Musculoskeletal:        General: Normal range of motion.  Cervical back: Normal range of motion.  Skin:    General: Skin is warm and dry.     Findings: No erythema.  Neurological:     Mental Status: He is alert and oriented to person, place, and time. Mental status is at baseline.     Cranial Nerves: No cranial nerve deficit.     Motor: No abnormal muscle tone.  Psychiatric:        Mood and Affect: Mood and affect normal.      LABORATORY DATA:  I have reviewed the data as listed    Latest Ref Rng & Units 08/06/2023    9:01 AM 07/05/2023    9:38 AM 06/07/2023    9:31 AM  CBC  WBC 4.0 - 10.5 K/uL 5.1  5.2  4.4   Hemoglobin 13.0 - 17.0 g/dL 88.1  87.1  87.9   Hematocrit 39.0 - 52.0 % 34.6  37.9  35.9   Platelets 150 - 400 K/uL 222  215  224       Latest Ref Rng & Units 07/05/2023    9:38 AM 06/07/2023    9:31 AM 05/03/2023    9:42 AM  CMP  Glucose 70 - 99 mg/dL 877  880  92   BUN 6 - 20 mg/dL 15  15  16    Creatinine 0.61 - 1.24 mg/dL 8.94  8.85  8.95   Sodium 135 - 145 mmol/L 135  135  134   Potassium 3.5 - 5.1 mmol/L 3.8  3.8  3.5   Chloride 98 - 111 mmol/L 103  104  100   CO2 22 - 32 mmol/L 23  20  23    Calcium  8.9 - 10.3 mg/dL 9.6  9.0  9.3   Total Protein 6.5 - 8.1 g/dL 7.2  7.3  7.6   Total Bilirubin 0.0 - 1.2 mg/dL 0.6  0.7  0.8   Alkaline Phos 38 - 126 U/L 68  67  69   AST 15 - 41 U/L 39  23  29   ALT 0 - 44 U/L 35  26  29         RADIOGRAPHIC STUDIES: I have personally reviewed the radiological images as listed and agreed with the findings in the report. No results found.

## 2023-08-06 NOTE — Assessment & Plan Note (Signed)
#   IgG lamda multiple myeloma, status post autologous marrow transplant on 05/22/2022 08/04/2022 Bone marrow biopsy done at [facility] showed no plasma cell. - in remission.  Post transplant PET scan showed no hypermetabolic activity Labs are reviewed and discussed with patient. Counts are stable. Tolerate treatment well.  Continue Revlimid  10mg  daily  Recommend Aspirin  81mg  daily  finished Acyclovir  daily for 1 year post transplant, off acyclovir

## 2023-08-06 NOTE — Assessment & Plan Note (Signed)
 Normal magnesium  level .  He has been off Slow-Mag.  Monitor magnesium  level monthly.

## 2023-08-06 NOTE — Assessment & Plan Note (Signed)
 Potassium has been stable and he has been taking potassium 20 mEq on and off.   Recommend to potassium 20meq 2-3 times per week.SABRA

## 2023-08-06 NOTE — Assessment & Plan Note (Signed)
 08/21/22 PET showed no active disease.  dental clearance obtained.  Recommend Zometa Q3 months. . Recommend calcium and vitamin D supplementation

## 2023-08-06 NOTE — Assessment & Plan Note (Signed)
 Stable  monitor

## 2023-08-06 NOTE — Assessment & Plan Note (Signed)
 Chemotherapy plan as listed above

## 2023-08-07 ENCOUNTER — Other Ambulatory Visit: Payer: Self-pay

## 2023-08-09 LAB — MULTIPLE MYELOMA PANEL, SERUM
Albumin SerPl Elph-Mcnc: 3.5 g/dL (ref 2.9–4.4)
Albumin/Glob SerPl: 1.2 (ref 0.7–1.7)
Alpha 1: 0.2 g/dL (ref 0.0–0.4)
Alpha2 Glob SerPl Elph-Mcnc: 0.6 g/dL (ref 0.4–1.0)
B-Globulin SerPl Elph-Mcnc: 1.3 g/dL (ref 0.7–1.3)
Gamma Glob SerPl Elph-Mcnc: 1 g/dL (ref 0.4–1.8)
Globulin, Total: 3.1 g/dL (ref 2.2–3.9)
IgA: 109 mg/dL (ref 90–386)
IgG (Immunoglobin G), Serum: 1071 mg/dL (ref 603–1613)
IgM (Immunoglobulin M), Srm: 30 mg/dL (ref 20–172)
Total Protein ELP: 6.6 g/dL (ref 6.0–8.5)

## 2023-08-09 LAB — KAPPA/LAMBDA LIGHT CHAINS
Kappa free light chain: 9.5 mg/L (ref 3.3–19.4)
Kappa, lambda light chain ratio: 0.94 (ref 0.26–1.65)
Lambda free light chains: 10.1 mg/L (ref 5.7–26.3)

## 2023-08-12 ENCOUNTER — Other Ambulatory Visit: Payer: Self-pay

## 2023-08-27 ENCOUNTER — Other Ambulatory Visit: Payer: Self-pay | Admitting: Oncology

## 2023-08-27 DIAGNOSIS — C9000 Multiple myeloma not having achieved remission: Secondary | ICD-10-CM

## 2023-08-31 ENCOUNTER — Encounter: Payer: Self-pay | Admitting: Oncology

## 2023-09-07 ENCOUNTER — Encounter: Payer: Self-pay | Admitting: Oncology

## 2023-09-07 ENCOUNTER — Inpatient Hospital Stay (HOSPITAL_BASED_OUTPATIENT_CLINIC_OR_DEPARTMENT_OTHER): Admitting: Oncology

## 2023-09-07 ENCOUNTER — Inpatient Hospital Stay: Attending: Oncology

## 2023-09-07 VITALS — BP 120/77 | HR 83 | Temp 97.4°F | Resp 18 | Wt 258.6 lb

## 2023-09-07 DIAGNOSIS — Z808 Family history of malignant neoplasm of other organs or systems: Secondary | ICD-10-CM | POA: Diagnosis not present

## 2023-09-07 DIAGNOSIS — F17290 Nicotine dependence, other tobacco product, uncomplicated: Secondary | ICD-10-CM | POA: Diagnosis not present

## 2023-09-07 DIAGNOSIS — D649 Anemia, unspecified: Secondary | ICD-10-CM | POA: Diagnosis not present

## 2023-09-07 DIAGNOSIS — C7951 Secondary malignant neoplasm of bone: Secondary | ICD-10-CM | POA: Insufficient documentation

## 2023-09-07 DIAGNOSIS — E876 Hypokalemia: Secondary | ICD-10-CM

## 2023-09-07 DIAGNOSIS — Z79899 Other long term (current) drug therapy: Secondary | ICD-10-CM | POA: Diagnosis not present

## 2023-09-07 DIAGNOSIS — Z801 Family history of malignant neoplasm of trachea, bronchus and lung: Secondary | ICD-10-CM | POA: Insufficient documentation

## 2023-09-07 DIAGNOSIS — C9001 Multiple myeloma in remission: Secondary | ICD-10-CM | POA: Insufficient documentation

## 2023-09-07 DIAGNOSIS — Z807 Family history of other malignant neoplasms of lymphoid, hematopoietic and related tissues: Secondary | ICD-10-CM | POA: Insufficient documentation

## 2023-09-07 DIAGNOSIS — Z5111 Encounter for antineoplastic chemotherapy: Secondary | ICD-10-CM | POA: Diagnosis not present

## 2023-09-07 LAB — CMP (CANCER CENTER ONLY)
ALT: 47 U/L — ABNORMAL HIGH (ref 0–44)
AST: 45 U/L — ABNORMAL HIGH (ref 15–41)
Albumin: 4.2 g/dL (ref 3.5–5.0)
Alkaline Phosphatase: 59 U/L (ref 38–126)
Anion gap: 9 (ref 5–15)
BUN: 13 mg/dL (ref 6–20)
CO2: 21 mmol/L — ABNORMAL LOW (ref 22–32)
Calcium: 9.3 mg/dL (ref 8.9–10.3)
Chloride: 103 mmol/L (ref 98–111)
Creatinine: 0.93 mg/dL (ref 0.61–1.24)
GFR, Estimated: >60 mL/min (ref >60)
Glucose, Bld: 104 mg/dL — ABNORMAL HIGH (ref 70–99)
Potassium: 3.8 mmol/L (ref 3.5–5.1)
Sodium: 133 mmol/L — ABNORMAL LOW (ref 135–145)
Total Bilirubin: 0.9 mg/dL (ref 0.0–1.2)
Total Protein: 7.7 g/dL (ref 6.5–8.1)

## 2023-09-07 LAB — CBC WITH DIFFERENTIAL (CANCER CENTER ONLY)
Abs Immature Granulocytes: 0.02 K/uL (ref 0.00–0.07)
Basophils Absolute: 0 K/uL (ref 0.0–0.1)
Basophils Relative: 0 %
Eosinophils Absolute: 0.2 K/uL (ref 0.0–0.5)
Eosinophils Relative: 4 %
HCT: 35.8 % — ABNORMAL LOW (ref 39.0–52.0)
Hemoglobin: 12.1 g/dL — ABNORMAL LOW (ref 13.0–17.0)
Immature Granulocytes: 0 %
Lymphocytes Relative: 22 %
Lymphs Abs: 1.1 K/uL (ref 0.7–4.0)
MCH: 27.7 pg (ref 26.0–34.0)
MCHC: 33.8 g/dL (ref 30.0–36.0)
MCV: 81.9 fL (ref 80.0–100.0)
Monocytes Absolute: 0.6 K/uL (ref 0.1–1.0)
Monocytes Relative: 12 %
Neutro Abs: 3.1 K/uL (ref 1.7–7.7)
Neutrophils Relative %: 62 %
Platelet Count: 270 K/uL (ref 150–400)
RBC: 4.37 MIL/uL (ref 4.22–5.81)
RDW: 14 % (ref 11.5–15.5)
WBC Count: 5.1 K/uL (ref 4.0–10.5)
nRBC: 0 % (ref 0.0–0.2)

## 2023-09-07 MED ORDER — POTASSIUM CHLORIDE CRYS ER 20 MEQ PO TBCR: 20.0000 meq | EXTENDED_RELEASE_TABLET | Freq: Every day | ORAL | 0 refills | Status: AC

## 2023-09-07 NOTE — Assessment & Plan Note (Signed)
 Potassium has been stable and he has been taking potassium 20 mEq on and off.   Recommend to potassium 20meq 2-3 times per week.SABRA

## 2023-09-07 NOTE — Assessment & Plan Note (Signed)
 Stable  monitor

## 2023-09-07 NOTE — Progress Notes (Signed)
 Hematology/Oncology Progress note [phone] [phone]     CHIEF COMPLAINTS/PURPOSE OF CONSULTATION:  Multiple myeloma  ASSESSMENT & PLAN:   Cancer Staging  Multiple myeloma in remission [facility]) Staging form: Plasma Cell Myeloma and Plasma Cell Disorders, AJCC 8th Edition - Clinical stage from 08/20/2021: RISS Stage II (Beta-2 -microglobulin (mg/L): 3.6, Albumin (g/dL): 2.2, ISS: Stage II, High-risk cytogenetics: Absent, LDH: Normal) - Signed by Babara Call, MD on 10/14/2021   Multiple myeloma in remission (HCC) # IgG lamda multiple myeloma, status post autologous marrow transplant on 05/22/2022 08/04/2022 Bone marrow biopsy done at [facility] showed no plasma cell. - in remission.  Post transplant PET scan showed no hypermetabolic activity Labs are reviewed and discussed with patient. Counts are stable. Tolerate treatment well.  Continue Revlimid  10mg  daily  Recommend Aspirin  81mg  daily  finished Acyclovir  daily for 1 year post transplant, currently off acyclovir   Encounter for antineoplastic chemotherapy Chemotherapy plan as listed above.   Hypokalemia Potassium has been stable and he has been taking potassium 20 mEq on and off.   Recommend to potassium 20meq 2-3 times per week..   Metastatic cancer to bone (HCC) 08/21/22 PET showed no active disease.  dental clearance obtained.  Recommend Zometa  Q3 months. . Recommend calcium  and vitamin D supplementation      Normocytic anemia Stable monitor.    Hypercalcemia Possibly secondary to recent alcohol use.  Recommend patient to decrease alcohol consumption   Follow-up in 4 weeks All questions were answered. The patient knows to call the clinic with any problems, questions or concerns. No barriers to learning was detected.  Call Babara, MD 09/07/2023    HISTORY OF PRESENTING ILLNESS:  Tanner [name] 38 y.o. male presents  for follow up of Multiple myeloma I have reviewed his chart and materials  related to his cancer extensively and collaborated history with the patient. Summary of oncologic history is as follows: Oncology History  Multiple myeloma in remission (HCC)  08/14/2021 Imaging   CT chest abdomen pelvis without contrast Showed numerous lucencies throughout the bone structures, most pronounced throughout the spine and left left iliac bone.  Otherwise no acute findings in the chest abdomen or pelvis.  Sigmoid diverticulosis.   08/19/2021 Initial Diagnosis   Multiple myeloma not having achieved remission, stage II  -08/15/2021, bone marrow biopsy showed plasma cell myeloma involving greater than 80% of the marrow. Cytogenetics Myeloma  FISH   08/20/2021 Cancer Staging   Staging form: Plasma Cell Myeloma and Plasma Cell Disorders, AJCC 8th Edition - Clinical stage from 08/20/2021: RISS Stage II (Beta-2 -microglobulin (mg/L): 3.6, Albumin (g/dL): 2.2, ISS: Stage II, High-risk cytogenetics: Absent, LDH: Normal) - Signed by Babara Call, MD on 10/14/2021 Beta 2 microglobulin range (mg/L): 3.5 to 5.49 Albumin range (g/dL): Less than 3.5 Cytogenetics: No abnormalities   08/20/2021 -  Chemotherapy   MYELOMA NEWLY DIAGNOSED TRANSPLANT CANDIDATE DaraVRd (Daratumumab  SUBQ) q21d x 6 Cycles (Induction/Consolidation)      09/11/2021 Imaging   PET scan showed  1. Extensive multifocal hypermetabolic disease throughout the axial and appendicular skeleton, with numerous lucent osseous lesions,consistent with patient's known diagnosis of multiple myeloma. 2. No tracer avid lymph node or mass identified to suggest soft tissue plasmacytoma   12/18/2021 Imaging   MRI lumbar 1. Multiple scattered enhancing marrow replacing bone lesions within the imaged spine and pelvis consistent with known history of multiple myeloma. No pathologic fracture or extraosseous soft tissue component. 2. Unchanged mild multilevel degenerative changes of the lumbar spine resulting in mild canal stenosis  at L2-L3 and L3-L4.   3. Left greater than right subarticular recess stenosis and mild left foraminal stenosis at L5-S1.      02/25/2022 Imaging   PET  1. Decreased FDG avidity associated with the multifocal hypermetabolic osseous lesions, consistent with treatment response. 2. Unchanged pathologic fracture of the T8 superior endplate. 3. Prominent bilateral cervical lymph nodes are similar in size to prior but now demonstrate mildly metabolic activity, nonspecific but favored to be reactive. Attention on follow-up imaging suggested 4. Hypermetabolic hyperplasia of the tonsils, also favored reactive.   05/22/2022 Bone Marrow Transplant   Patient underwent autologous stem cell transplant. 05/30/2022, neutropenic fever.   06/03/2022 engraftment 06/04/2022, G-CSF stopped.    08/21/2022 Imaging   PET  No evidence of hypermetabolic myeloma.      INTERVAL HISTORY Tanner [name] is a 38 y.o. male who has above history reviewed by me today presents for follow up visit for management of multiple myeloma Patient is status post autologous stem cell transplant.   He is doing well.he takes Revlimid  10mg  daily. He tolerates well. He denies fever, chills, nausea vomiting diarrhea. He [facility] immunization [facility].    MEDICAL HISTORY:  Past Medical History:  Diagnosis Date   Alcohol use    Sciatica    Smoking     SURGICAL HISTORY: Past Surgical History:  Procedure Laterality Date   INGUINAL HERNIA REPAIR      SOCIAL HISTORY: Social History   Socioeconomic History   Marital status: Single    Spouse name: Not on file   Number of children: Not on file   Years of education: Not on file   Highest education level: Not on file  Occupational History   Not on file  Tobacco Use   Smoking status: Former    Current packs/day: 0.00    Average packs/day: 0.5 packs/day for 10.0 years (5.0 ttl pk-yrs)    Types: E-cigarettes, Cigarettes    Start date: 08/12/2011    Quit date: 08/11/2021    Years since  quitting: 2.0   Smokeless tobacco: Never  Vaping Use   Vaping status: Every Day  Substance and Sexual Activity   Alcohol use: Not Currently    Alcohol/week: 24.0 standard drinks of alcohol    Types: 24 Cans of beer per week    Comment: quit drinking approx 08/11/2021   Drug use: Never   Sexual activity: Not on file  Other Topics Concern   Not on file  Social History Narrative   Not on file   Social Drivers of Health   Financial Resource Strain: Low Risk  (05/30/2022)   Received from [facility]   Overall Financial Resource Strain (CARDIA)    Difficulty of Paying Living Expenses: Not hard at all  Food Insecurity: No Food Insecurity (05/30/2022)   Received from [facility]   Hunger Vital Sign    Within the past 12 months, you worried that your food would run out before you got the money to buy more.: Never true    Within the past 12 months, the food you bought just didn't last and you didn't have money to get more.: Never true  Transportation Needs: No Transportation Needs (06/01/2022)   Received from [facility] - Transportation    In the past 12 months, has lack of transportation kept you from medical appointments or from getting medications?: No    Lack of Transportation (Non-Medical): No  Physical Activity: Not  on file  Stress: Stress Concern Present (01/13/2022)   Received from [facility] - Occupational Stress Questionnaire    Feeling of Stress : To some extent  Social Connections: Not on file  Intimate Partner Violence: Not on file    FAMILY HISTORY: Family History  Problem Relation Age of Onset   Ovarian cancer Mother    Asthma Mother    Alcoholism Father    Throat cancer Maternal Uncle    Heart disease Maternal Uncle    Lung cancer Maternal Uncle    Lymphoma Maternal Uncle    Lung cancer Paternal Grandmother     ALLERGIES:  has no known  allergies.  MEDICATIONS:  Current Outpatient Medications  Medication Sig Dispense Refill   acyclovir  (ZOVIRAX ) 400 MG tablet Take 400 mg by mouth 2 (two) times daily.     aspirin  EC 81 MG tablet Take 81 mg by mouth daily. Swallow whole.     CALCIUM /VITAMIN D3 GUMMIES 200-5 MG-MCG CHEW TAKE 2 TABLETS BY MOUTH EVERY DAY 60 tablet 5   Cyanocobalamin  1000 MCG SUBL Take 1,000 mcg by mouth.     lenalidomide  (REVLIMID ) 10 MG capsule TAKE 1 CAPSULE BY MOUTH 1 TIME A DAY 28 capsule 0   ondansetron  (ZOFRAN ) 8 MG tablet Take 1 tablet (8 mg total) by mouth 2 (two) times daily as needed for nausea or vomiting. 20 tablet 2   senna-docusate (SENOKOT-S) 8.6-50 MG tablet Take 1 tablet by mouth daily. 60 tablet 2   potassium chloride  SA (KLOR-CON  M20) 20 MEQ tablet Take 1 tablet (20 mEq total) by mouth daily. 90 tablet 0   No current facility-administered medications for this visit.    Review of Systems  Constitutional:  Negative for appetite change, chills, fatigue, fever and unexpected weight change.  HENT:   Negative for hearing loss, sore throat and voice change.   Eyes:  Negative for eye problems and icterus.  Respiratory:  Negative for chest tightness, cough and shortness of breath.   Cardiovascular:  Negative for chest pain and leg swelling.  Gastrointestinal:  Negative for abdominal distention and abdominal pain.  Endocrine: Negative for hot flashes.  Genitourinary:  Negative for difficulty urinating, dysuria and frequency.   Musculoskeletal:  Negative for arthralgias and back pain.  Skin:  Negative for itching and rash.  Neurological:  Negative for light-headedness and numbness.  Hematological:  Negative for adenopathy. Does not bruise/bleed easily.  Psychiatric/Behavioral:  Negative for confusion.      PHYSICAL EXAMINATION: ECOG PERFORMANCE STATUS: 1 - Symptomatic but completely ambulatory  Vitals:   09/07/23 1048  BP: 120/77  Pulse: 83  Resp: 18  Temp: (!) 97.4 F (36.3 C)  SpO2:  100%   Filed Weights   09/07/23 1048  Weight: 258 lb 9.6 oz (117.3 kg)     Physical Exam Constitutional:      General: He is not in acute distress.    Appearance: He is obese. He is not diaphoretic.  HENT:     Head: Normocephalic and atraumatic.     Mouth/Throat:     Pharynx: No oropharyngeal exudate.  Eyes:     General: No scleral icterus. Cardiovascular:     Rate and Rhythm: Normal rate.     Heart sounds: No murmur heard. Pulmonary:     Effort: Pulmonary effort is normal. No respiratory distress.  Abdominal:     General: There is no distension.     Palpations: Abdomen is soft.  Musculoskeletal:        General: Normal range of motion.     Cervical back: Normal range of motion.  Skin:    General: Skin is warm and dry.     Findings: No erythema.  Neurological:     Mental Status: He is alert and oriented to person, place, and time. Mental status is at baseline.     Cranial Nerves: No cranial nerve deficit.     Motor: No abnormal muscle tone.  Psychiatric:        Mood and Affect: Mood and affect normal.      LABORATORY DATA:  I have reviewed the data as listed    Latest Ref Rng & Units 09/07/2023   10:32 AM 08/06/2023    9:01 AM 07/05/2023    9:38 AM  CBC  WBC 4.0 - 10.5 K/uL 5.1  5.1  5.2   Hemoglobin 13.0 - 17.0 g/dL 87.8  88.1  87.1   Hematocrit 39.0 - 52.0 % 35.8  34.6  37.9   Platelets 150 - 400 K/uL 270  222  215       Latest Ref Rng & Units 09/07/2023   10:32 AM 08/06/2023    9:01 AM 07/05/2023    9:38 AM  CMP  Glucose 70 - 99 mg/dL 895  894  877   BUN 6 - 20 mg/dL 13  11  15    Creatinine 0.61 - 1.24 mg/dL 9.06  8.92  8.94   Sodium 135 - 145 mmol/L 133  134  135   Potassium 3.5 - 5.1 mmol/L 3.8  3.7  3.8   Chloride 98 - 111 mmol/L 103  103  103   CO2 22 - 32 mmol/L 21  22  23    Calcium  8.9 - 10.3 mg/dL 9.3  8.9  9.6   Total Protein 6.5 - 8.1 g/dL 7.7  7.1  7.2   Total Bilirubin 0.0 - 1.2 mg/dL 0.9  0.8  0.6   Alkaline Phos 38 - 126 U/L 59  61  68   AST  15 - 41 U/L 45  36  39   ALT 0 - 44 U/L 47  42  35        RADIOGRAPHIC STUDIES: I have personally reviewed the radiological images as listed and agreed with the findings in the report. No results found.

## 2023-09-07 NOTE — Assessment & Plan Note (Signed)
 Possibly secondary to recent alcohol use.  Recommend patient to decrease alcohol consumption

## 2023-09-07 NOTE — Assessment & Plan Note (Addendum)
#   IgG lamda multiple myeloma, status post autologous marrow transplant on 05/22/2022 08/04/2022 Bone marrow biopsy done at [facility] showed no plasma cell. - in remission.  Post transplant PET scan showed no hypermetabolic activity Labs are reviewed and discussed with patient. Counts are stable. Tolerate treatment well.  Continue Revlimid  10mg  daily  Recommend Aspirin  81mg  daily  finished Acyclovir  daily for 1 year post transplant, currently off acyclovir

## 2023-09-07 NOTE — Assessment & Plan Note (Signed)
 08/21/22 PET showed no active disease.  dental clearance obtained.  Recommend Zometa Q3 months. . Recommend calcium and vitamin D supplementation

## 2023-09-07 NOTE — Assessment & Plan Note (Signed)
 Chemotherapy plan as listed above

## 2023-09-08 ENCOUNTER — Other Ambulatory Visit: Payer: Self-pay

## 2023-09-08 LAB — KAPPA/LAMBDA LIGHT CHAINS
Kappa free light chain: 11.3 mg/L (ref 3.3–19.4)
Kappa, lambda light chain ratio: 0.91 (ref 0.26–1.65)
Lambda free light chains: 12.4 mg/L (ref 5.7–26.3)

## 2023-09-09 ENCOUNTER — Other Ambulatory Visit: Payer: Self-pay

## 2023-09-10 LAB — MULTIPLE MYELOMA PANEL, SERUM
Albumin SerPl Elph-Mcnc: 3.9 g/dL (ref 2.9–4.4)
Albumin/Glob SerPl: 1.2 (ref 0.7–1.7)
Alpha 1: 0.2 g/dL (ref 0.0–0.4)
Alpha2 Glob SerPl Elph-Mcnc: 0.7 g/dL (ref 0.4–1.0)
B-Globulin SerPl Elph-Mcnc: 1.3 g/dL (ref 0.7–1.3)
Gamma Glob SerPl Elph-Mcnc: 1.1 g/dL (ref 0.4–1.8)
Globulin, Total: 3.3 g/dL (ref 2.2–3.9)
IgA: 123 mg/dL (ref 90–386)
IgG (Immunoglobin G), Serum: 1132 mg/dL (ref 603–1613)
IgM (Immunoglobulin M), Srm: 34 mg/dL (ref 20–172)
Total Protein ELP: 7.2 g/dL (ref 6.0–8.5)

## 2023-09-21 ENCOUNTER — Other Ambulatory Visit: Payer: Self-pay

## 2023-10-05 ENCOUNTER — Encounter: Payer: Self-pay | Admitting: Oncology

## 2023-10-11 ENCOUNTER — Inpatient Hospital Stay: Attending: Oncology

## 2023-10-11 ENCOUNTER — Inpatient Hospital Stay

## 2023-10-11 ENCOUNTER — Encounter: Payer: Self-pay | Admitting: Oncology

## 2023-10-11 ENCOUNTER — Inpatient Hospital Stay (HOSPITAL_BASED_OUTPATIENT_CLINIC_OR_DEPARTMENT_OTHER): Admitting: Oncology

## 2023-10-11 VITALS — BP 115/80 | HR 88 | Temp 97.9°F | Resp 91 | Ht 72.0 in | Wt 259.0 lb

## 2023-10-11 DIAGNOSIS — C9001 Multiple myeloma in remission: Secondary | ICD-10-CM | POA: Insufficient documentation

## 2023-10-11 DIAGNOSIS — D649 Anemia, unspecified: Secondary | ICD-10-CM

## 2023-10-11 DIAGNOSIS — E876 Hypokalemia: Secondary | ICD-10-CM

## 2023-10-11 DIAGNOSIS — Z23 Encounter for immunization: Secondary | ICD-10-CM | POA: Diagnosis not present

## 2023-10-11 DIAGNOSIS — C7951 Secondary malignant neoplasm of bone: Secondary | ICD-10-CM | POA: Insufficient documentation

## 2023-10-11 DIAGNOSIS — Z79899 Other long term (current) drug therapy: Secondary | ICD-10-CM | POA: Insufficient documentation

## 2023-10-11 DIAGNOSIS — Z5111 Encounter for antineoplastic chemotherapy: Secondary | ICD-10-CM | POA: Diagnosis not present

## 2023-10-11 LAB — CBC WITH DIFFERENTIAL (CANCER CENTER ONLY)
Abs Immature Granulocytes: 0.03 K/uL (ref 0.00–0.07)
Basophils Absolute: 0 K/uL (ref 0.0–0.1)
Basophils Relative: 1 %
Eosinophils Absolute: 0.1 K/uL (ref 0.0–0.5)
Eosinophils Relative: 2 %
HCT: 36.5 % — ABNORMAL LOW (ref 39.0–52.0)
Hemoglobin: 12.4 g/dL — ABNORMAL LOW (ref 13.0–17.0)
Immature Granulocytes: 1 %
Lymphocytes Relative: 24 %
Lymphs Abs: 1.4 K/uL (ref 0.7–4.0)
MCH: 27.3 pg (ref 26.0–34.0)
MCHC: 34 g/dL (ref 30.0–36.0)
MCV: 80.4 fL (ref 80.0–100.0)
Monocytes Absolute: 0.5 K/uL (ref 0.1–1.0)
Monocytes Relative: 9 %
Neutro Abs: 3.7 K/uL (ref 1.7–7.7)
Neutrophils Relative %: 63 %
Platelet Count: 269 K/uL (ref 150–400)
RBC: 4.54 MIL/uL (ref 4.22–5.81)
RDW: 14.1 % (ref 11.5–15.5)
WBC Count: 5.8 K/uL (ref 4.0–10.5)
nRBC: 0 % (ref 0.0–0.2)

## 2023-10-11 LAB — CMP (CANCER CENTER ONLY)
ALT: 39 U/L (ref 0–44)
AST: 33 U/L (ref 15–41)
Albumin: 4.2 g/dL (ref 3.5–5.0)
Alkaline Phosphatase: 72 U/L (ref 38–126)
Anion gap: 8 (ref 5–15)
BUN: 15 mg/dL (ref 6–20)
CO2: 24 mmol/L (ref 22–32)
Calcium: 9.1 mg/dL (ref 8.9–10.3)
Chloride: 101 mmol/L (ref 98–111)
Creatinine: 1.13 mg/dL (ref 0.61–1.24)
GFR, Estimated: >60 mL/min (ref >60)
Glucose, Bld: 97 mg/dL (ref 70–99)
Potassium: 3.8 mmol/L (ref 3.5–5.1)
Sodium: 133 mmol/L — ABNORMAL LOW (ref 135–145)
Total Bilirubin: 1 mg/dL (ref 0.0–1.2)
Total Protein: 7.6 g/dL (ref 6.5–8.1)

## 2023-10-11 MED ORDER — SODIUM CHLORIDE 0.9 % IV SOLN
INTRAVENOUS | Status: DC
  Filled 2023-10-11: qty 250

## 2023-10-11 MED ORDER — ZOLEDRONIC ACID 4 MG/100ML IV SOLN
4.0000 mg | Freq: Once | INTRAVENOUS | Status: AC
  Administered 2023-10-11: 4 mg via INTRAVENOUS
  Filled 2023-10-11: qty 100

## 2023-10-11 MED ORDER — INFLUENZA VIRUS VACC SPLIT PF (FLUZONE) 0.5 ML IM SUSY
0.5000 mL | PREFILLED_SYRINGE | Freq: Once | INTRAMUSCULAR | Status: AC
  Administered 2023-10-11: 0.5 mL via INTRAMUSCULAR
  Filled 2023-10-11: qty 0.5

## 2023-10-11 NOTE — Assessment & Plan Note (Signed)
 08/21/22 PET showed no active disease.  dental clearance obtained.  Recommend Zometa Q3 months. . Recommend calcium and vitamin D supplementation

## 2023-10-11 NOTE — Assessment & Plan Note (Signed)
 Potassium has been stable and he has been taking potassium 20 mEq on and off.   Recommend to potassium 20meq 2-3 times per week.SABRA

## 2023-10-11 NOTE — Assessment & Plan Note (Signed)
 Chemotherapy plan as listed above

## 2023-10-11 NOTE — Assessment & Plan Note (Signed)
 Stable  monitor

## 2023-10-11 NOTE — Assessment & Plan Note (Signed)
#   IgG lamda multiple myeloma, status post autologous marrow transplant on 05/22/2022 08/04/2022 Bone marrow biopsy done at [facility] showed no plasma cell. - in remission.  Post transplant PET scan showed no hypermetabolic activity Labs are reviewed and discussed with patient. Counts are stable. Tolerate treatment well.  Continue Revlimid  10mg  daily  Recommend Aspirin  81mg  daily  finished Acyclovir  daily for 1 year post transplant, currently off acyclovir

## 2023-10-11 NOTE — Assessment & Plan Note (Signed)
 Normal magnesium  level .  He has been off Slow-Mag.  Monitor magnesium  level monthly.

## 2023-10-11 NOTE — Progress Notes (Signed)
 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 - in remission.  Post transplant PET scan showed no hypermetabolic activity Labs are reviewed and discussed with patient. Counts are stable. Tolerate treatment well.  Continue Revlimid  10mg  daily  Recommend Aspirin  81mg  daily  finished Acyclovir  daily for 1 year post transplant, currently off acyclovir   Encounter for antineoplastic chemotherapy Chemotherapy plan as listed above.   Hypokalemia Potassium has been stable and he has been taking potassium 20 mEq on and off.   Recommend to potassium 20meq 2-3 times per week.SABRA   Hypomagnesemia Normal magnesium  level .  He has been off Slow-Mag.  Monitor magnesium  level monthly.   Metastatic cancer to bone (HCC) 08/21/22 PET showed no active disease.  dental clearance obtained.  Recommend Zometa  Q3 months. . Recommend calcium  and vitamin D supplementation      Normocytic anemia Stable monitor.    Need for prophylactic vaccination and inoculation against influenza Influenza vaccination today    Follow-up in 4 weeks All questions were answered. The patient knows to call the clinic with any problems, questions or concerns. No barriers to learning was detected.  Call Babara, MD 10/11/2023    HISTORY OF PRESENTING ILLNESS:  Tanner [name]  38 y.o. male presents  for follow up of Multiple myeloma I have reviewed his chart and materials related to his cancer extensively and collaborated history with the patient. Summary of oncologic history is as follows: Oncology History  Multiple myeloma in remission (HCC)  08/14/2021 Imaging   CT chest abdomen pelvis without contrast Showed numerous lucencies throughout the bone structures, most pronounced throughout the spine and left left iliac bone.  Otherwise no acute findings in the chest abdomen or pelvis.  Sigmoid diverticulosis.   08/19/2021 Initial Diagnosis   Multiple myeloma not having achieved remission, stage II  -08/15/2021, bone marrow biopsy showed plasma cell myeloma involving greater than 80% of the marrow. Cytogenetics Myeloma  FISH   08/20/2021 Cancer Staging   Staging form: Plasma Cell Myeloma and Plasma Cell Disorders, AJCC 8th Edition - Clinical stage from 08/20/2021: RISS Stage II (Beta-2 -microglobulin (mg/L): 3.6, Albumin (g/dL): 2.2, ISS: Stage II, High-risk cytogenetics: Absent, LDH: Normal) - Signed by Babara Call, MD on 10/14/2021 Beta 2 microglobulin range (mg/L): 3.5 to 5.49 Albumin range (g/dL): Less than 3.5 Cytogenetics: No abnormalities   08/20/2021 -  Chemotherapy   MYELOMA NEWLY DIAGNOSED TRANSPLANT CANDIDATE DaraVRd (Daratumumab  SUBQ) q21d x 6 Cycles (Induction/Consolidation)      09/11/2021 Imaging   PET scan showed  1. Extensive multifocal hypermetabolic disease throughout the axial and appendicular skeleton, with numerous lucent osseous lesions,consistent with patient's known diagnosis of multiple myeloma. 2. No tracer avid lymph node or mass identified to suggest soft tissue plasmacytoma   12/18/2021 Imaging   MRI lumbar 1. Multiple scattered enhancing marrow replacing bone lesions within the imaged spine and pelvis consistent with known history of multiple myeloma. No pathologic fracture or extraosseous soft tissue  component. 2. Unchanged mild  multilevel degenerative changes of the lumbar spine resulting in mild canal stenosis at L2-L3 and L3-L4.  3. Left greater than right subarticular recess stenosis and mild left foraminal stenosis at L5-S1.      02/25/2022 Imaging   PET  1. Decreased FDG avidity associated with the multifocal hypermetabolic osseous lesions, consistent with treatment response. 2. Unchanged pathologic fracture of the T8 superior endplate. 3. Prominent bilateral cervical lymph nodes are similar in size to prior but now demonstrate mildly metabolic activity, nonspecific but favored to be reactive. Attention on follow-up imaging suggested 4. Hypermetabolic hyperplasia of the tonsils, also favored reactive.   05/22/2022 Bone Marrow Transplant   Patient underwent autologous stem cell transplant. 05/30/2022, neutropenic fever.   06/03/2022 engraftment 06/04/2022, G-CSF stopped.    08/21/2022 Imaging   PET  No evidence of hypermetabolic myeloma.      INTERVAL HISTORY Tanner [name] is a 38 y.o. male who has above history reviewed by me today presents for follow up visit for management of multiple myeloma Patient is status post autologous stem cell transplant.   He is doing well.he takes Revlimid  10mg  daily. He tolerates well. He denies fever, chills, nausea vomiting diarrhea. He gets immunization at [facility]    MEDICAL HISTORY:  Past Medical History:  Diagnosis Date   Alcohol use    Sciatica    Smoking     SURGICAL HISTORY: Past Surgical History:  Procedure Laterality Date   INGUINAL HERNIA REPAIR      SOCIAL HISTORY: Social History   Socioeconomic History   Marital status: Single    Spouse name: Not on file   Number of children: Not on file   Years of education: Not on file   Highest education level: Not on file  Occupational History   Not on file  Tobacco Use   Smoking status: Former    Current packs/day: 0.00    Average packs/day: 0.5 packs/day for 10.0 years (5.0 ttl pk-yrs)     Types: E-cigarettes, Cigarettes    Start date: 08/12/2011    Quit date: 08/11/2021    Years since quitting: 2.1   Smokeless tobacco: Never  Vaping Use   Vaping status: Every Day  Substance and Sexual Activity   Alcohol use: Not Currently    Alcohol/week: 24.0 standard drinks of alcohol    Types: 24 Cans of beer per week    Comment: quit drinking approx 08/11/2021   Drug use: Never   Sexual activity: Not on file  Other Topics Concern   Not on file  Social History Narrative   Not on file   Social Drivers of Health   Financial Resource Strain: Low Risk  (05/30/2022)   Received from [facility]   Overall Financial Resource Strain (CARDIA)    Difficulty of Paying Living Expenses: Not hard at all  Food Insecurity: No Food Insecurity (05/30/2022)   Received from [facility]   Hunger Vital Sign    Within the past 12 months, you worried that your food would run out before you got the money to buy more.: Never true    Within the past 12 months, the food you bought just didn't last and you didn't have money to get more.: Never true  Transportation Needs: No Transportation Needs (06/01/2022)   Received from [facility] - Transportation    In the past 12 months, has lack of transportation kept you from medical appointments or  from getting medications?: No    Lack of Transportation (Non-Medical): No  Physical Activity: Not on file  Stress: Stress Concern Present (01/13/2022)   Received from [facility] - Occupational Stress Questionnaire    Feeling of Stress : To some extent  Social Connections: Not on file  Intimate Partner Violence: Not on file    FAMILY HISTORY: Family History  Problem Relation Age of Onset   Ovarian cancer Mother    Asthma Mother    Alcoholism Father    Throat cancer Maternal Uncle    Heart disease Maternal Uncle    Lung cancer Maternal Uncle     Lymphoma Maternal Uncle    Lung cancer Paternal Grandmother     ALLERGIES:  has no known allergies.  MEDICATIONS:  Current Outpatient Medications  Medication Sig Dispense Refill   acyclovir  (ZOVIRAX ) 400 MG tablet Take 400 mg by mouth 2 (two) times daily.     aspirin  EC 81 MG tablet Take 81 mg by mouth daily. Swallow whole.     CALCIUM /VITAMIN D3 GUMMIES 200-5 MG-MCG CHEW TAKE 2 TABLETS BY MOUTH EVERY DAY 60 tablet 5   Cyanocobalamin  1000 MCG SUBL Take 1,000 mcg by mouth.     lenalidomide  (REVLIMID ) 10 MG capsule TAKE 1 CAPSULE BY MOUTH 1 TIME A DAY 28 capsule 0   ondansetron  (ZOFRAN ) 8 MG tablet Take 1 tablet (8 mg total) by mouth 2 (two) times daily as needed for nausea or vomiting. 20 tablet 2   potassium chloride  SA (KLOR-CON  M20) 20 MEQ tablet Take 1 tablet (20 mEq total) by mouth daily. 90 tablet 0   senna-docusate (SENOKOT-S) 8.6-50 MG tablet Take 1 tablet by mouth daily. 60 tablet 2   No current facility-administered medications for this visit.    Review of Systems  Constitutional:  Negative for appetite change, chills, fatigue, fever and unexpected weight change.  HENT:   Negative for hearing loss, sore throat and voice change.   Eyes:  Negative for eye problems and icterus.  Respiratory:  Negative for chest tightness, cough and shortness of breath.   Cardiovascular:  Negative for chest pain and leg swelling.  Gastrointestinal:  Negative for abdominal distention and abdominal pain.  Endocrine: Negative for hot flashes.  Genitourinary:  Negative for difficulty urinating, dysuria and frequency.   Musculoskeletal:  Negative for arthralgias and back pain.  Skin:  Negative for itching and rash.  Neurological:  Negative for light-headedness and numbness.  Hematological:  Negative for adenopathy. Does not bruise/bleed easily.  Psychiatric/Behavioral:  Negative for confusion.      PHYSICAL EXAMINATION: ECOG PERFORMANCE STATUS: 1 - Symptomatic but completely  ambulatory  Vitals:   10/11/23 1012  BP: 115/80  Pulse: 88  Resp: (!) 91  Temp: 97.9 F (36.6 C)  SpO2: 100%   Filed Weights   10/11/23 1012  Weight: 259 lb (117.5 kg)     Physical Exam Constitutional:      General: He is not in acute distress.    Appearance: He is obese. He is not diaphoretic.  HENT:     Head: Normocephalic and atraumatic.     Mouth/Throat:     Pharynx: No oropharyngeal exudate.  Eyes:     General: No scleral icterus. Cardiovascular:     Rate and Rhythm: Normal rate.     Heart sounds: No murmur heard. Pulmonary:     Effort: Pulmonary effort is normal. No respiratory distress.  Abdominal:  General: There is no distension.     Palpations: Abdomen is soft.  Musculoskeletal:        General: Normal range of motion.     Cervical back: Normal range of motion.  Skin:    General: Skin is warm and dry.     Findings: No erythema.  Neurological:     Mental Status: He is alert and oriented to person, place, and time. Mental status is at baseline.     Cranial Nerves: No cranial nerve deficit.     Motor: No abnormal muscle tone.  Psychiatric:        Mood and Affect: Mood and affect normal.      LABORATORY DATA:  I have reviewed the data as listed    Latest Ref Rng & Units 10/11/2023    9:51 AM 09/07/2023   10:32 AM 08/06/2023    9:01 AM  CBC  WBC 4.0 - 10.5 K/uL 5.8  5.1  5.1   Hemoglobin 13.0 - 17.0 g/dL 87.5  87.8  88.1   Hematocrit 39.0 - 52.0 % 36.5  35.8  34.6   Platelets 150 - 400 K/uL 269  270  222       Latest Ref Rng & Units 10/11/2023    9:51 AM 09/07/2023   10:32 AM 08/06/2023    9:01 AM  CMP  Glucose 70 - 99 mg/dL 97  895  894   BUN 6 - 20 mg/dL 15  13  11    Creatinine 0.61 - 1.24 mg/dL 8.86  9.06  8.92   Sodium 135 - 145 mmol/L 133  133  134   Potassium 3.5 - 5.1 mmol/L 3.8  3.8  3.7   Chloride 98 - 111 mmol/L 101  103  103   CO2 22 - 32 mmol/L 24  21  22    Calcium  8.9 - 10.3 mg/dL 9.1  9.3  8.9   Total Protein 6.5 - 8.1 g/dL 7.6   7.7  7.1   Total Bilirubin 0.0 - 1.2 mg/dL 1.0  0.9  0.8   Alkaline Phos 38 - 126 U/L 72  59  61   AST 15 - 41 U/L 33  45  36   ALT 0 - 44 U/L 39  47  42        RADIOGRAPHIC STUDIES: I have personally reviewed the radiological images as listed and agreed with the findings in the report. No results found.

## 2023-10-11 NOTE — Assessment & Plan Note (Signed)
Influenza vaccination today 

## 2023-10-12 ENCOUNTER — Other Ambulatory Visit: Payer: Self-pay

## 2023-10-12 LAB — KAPPA/LAMBDA LIGHT CHAINS
Kappa free light chain: 13.4 mg/L (ref 3.3–19.4)
Kappa, lambda light chain ratio: 1.01 (ref 0.26–1.65)
Lambda free light chains: 13.3 mg/L (ref 5.7–26.3)

## 2023-10-14 LAB — MULTIPLE MYELOMA PANEL, SERUM
Albumin SerPl Elph-Mcnc: 3.9 g/dL (ref 2.9–4.4)
Albumin/Glob SerPl: 1.2 (ref 0.7–1.7)
Alpha 1: 0.2 g/dL (ref 0.0–0.4)
Alpha2 Glob SerPl Elph-Mcnc: 0.7 g/dL (ref 0.4–1.0)
B-Globulin SerPl Elph-Mcnc: 1.4 g/dL — ABNORMAL HIGH (ref 0.7–1.3)
Gamma Glob SerPl Elph-Mcnc: 1.1 g/dL (ref 0.4–1.8)
Globulin, Total: 3.3 g/dL (ref 2.2–3.9)
IgA: 145 mg/dL (ref 90–386)
IgG (Immunoglobin G), Serum: 1253 mg/dL (ref 603–1613)
IgM (Immunoglobulin M), Srm: 50 mg/dL (ref 20–172)
Total Protein ELP: 7.2 g/dL (ref 6.0–8.5)

## 2023-11-05 ENCOUNTER — Other Ambulatory Visit: Payer: Self-pay

## 2023-11-05 ENCOUNTER — Encounter: Payer: Self-pay | Admitting: Oncology

## 2023-11-05 DIAGNOSIS — C9000 Multiple myeloma not having achieved remission: Secondary | ICD-10-CM

## 2023-11-05 MED ORDER — LENALIDOMIDE 10 MG PO CAPS: 10.0000 mg | ORAL_CAPSULE | Freq: Every day | ORAL | 0 refills | Status: DC

## 2023-11-08 ENCOUNTER — Encounter: Payer: Self-pay | Admitting: Oncology

## 2023-11-08 ENCOUNTER — Inpatient Hospital Stay (HOSPITAL_BASED_OUTPATIENT_CLINIC_OR_DEPARTMENT_OTHER): Admitting: Oncology

## 2023-11-08 ENCOUNTER — Inpatient Hospital Stay: Attending: Oncology

## 2023-11-08 ENCOUNTER — Ambulatory Visit: Payer: Self-pay | Admitting: Oncology

## 2023-11-08 VITALS — BP 132/90 | HR 96 | Temp 98.8°F | Resp 18 | Wt 269.5 lb

## 2023-11-08 DIAGNOSIS — Z79899 Other long term (current) drug therapy: Secondary | ICD-10-CM | POA: Insufficient documentation

## 2023-11-08 DIAGNOSIS — E876 Hypokalemia: Secondary | ICD-10-CM | POA: Insufficient documentation

## 2023-11-08 DIAGNOSIS — D649 Anemia, unspecified: Secondary | ICD-10-CM | POA: Diagnosis not present

## 2023-11-08 DIAGNOSIS — Z9484 Stem cells transplant status: Secondary | ICD-10-CM | POA: Insufficient documentation

## 2023-11-08 DIAGNOSIS — Z9481 Bone marrow transplant status: Secondary | ICD-10-CM | POA: Insufficient documentation

## 2023-11-08 DIAGNOSIS — Z87891 Personal history of nicotine dependence: Secondary | ICD-10-CM | POA: Diagnosis not present

## 2023-11-08 DIAGNOSIS — Z807 Family history of other malignant neoplasms of lymphoid, hematopoietic and related tissues: Secondary | ICD-10-CM | POA: Insufficient documentation

## 2023-11-08 DIAGNOSIS — Z808 Family history of malignant neoplasm of other organs or systems: Secondary | ICD-10-CM | POA: Insufficient documentation

## 2023-11-08 DIAGNOSIS — C9001 Multiple myeloma in remission: Secondary | ICD-10-CM | POA: Insufficient documentation

## 2023-11-08 DIAGNOSIS — Z801 Family history of malignant neoplasm of trachea, bronchus and lung: Secondary | ICD-10-CM | POA: Diagnosis not present

## 2023-11-08 DIAGNOSIS — Z5111 Encounter for antineoplastic chemotherapy: Secondary | ICD-10-CM | POA: Diagnosis not present

## 2023-11-08 LAB — CBC WITH DIFFERENTIAL (CANCER CENTER ONLY)
Abs Immature Granulocytes: 0.03 K/uL (ref 0.00–0.07)
Basophils Absolute: 0 K/uL (ref 0.0–0.1)
Basophils Relative: 0 %
Eosinophils Absolute: 0.3 K/uL (ref 0.0–0.5)
Eosinophils Relative: 4 %
HCT: 35.3 % — ABNORMAL LOW (ref 39.0–52.0)
Hemoglobin: 11.9 g/dL — ABNORMAL LOW (ref 13.0–17.0)
Immature Granulocytes: 0 %
Lymphocytes Relative: 22 %
Lymphs Abs: 1.5 K/uL (ref 0.7–4.0)
MCH: 27.4 pg (ref 26.0–34.0)
MCHC: 33.7 g/dL (ref 30.0–36.0)
MCV: 81.1 fL (ref 80.0–100.0)
Monocytes Absolute: 0.6 K/uL (ref 0.1–1.0)
Monocytes Relative: 9 %
Neutro Abs: 4.3 K/uL (ref 1.7–7.7)
Neutrophils Relative %: 65 %
Platelet Count: 229 K/uL (ref 150–400)
RBC: 4.35 MIL/uL (ref 4.22–5.81)
RDW: 14.7 % (ref 11.5–15.5)
WBC Count: 6.7 K/uL (ref 4.0–10.5)
nRBC: 0 % (ref 0.0–0.2)

## 2023-11-08 LAB — CMP (CANCER CENTER ONLY)
ALT: 68 U/L — ABNORMAL HIGH (ref 0–44)
AST: 62 U/L — ABNORMAL HIGH (ref 15–41)
Albumin: 3.9 g/dL (ref 3.5–5.0)
Alkaline Phosphatase: 60 U/L (ref 38–126)
Anion gap: 11 (ref 5–15)
BUN: 13 mg/dL (ref 6–20)
CO2: 21 mmol/L — ABNORMAL LOW (ref 22–32)
Calcium: 8.9 mg/dL (ref 8.9–10.3)
Chloride: 101 mmol/L (ref 98–111)
Creatinine: 1.03 mg/dL (ref 0.61–1.24)
GFR, Estimated: >60 mL/min (ref >60)
Glucose, Bld: 104 mg/dL — ABNORMAL HIGH (ref 70–99)
Potassium: 3.4 mmol/L — ABNORMAL LOW (ref 3.5–5.1)
Sodium: 133 mmol/L — ABNORMAL LOW (ref 135–145)
Total Bilirubin: 0.8 mg/dL (ref 0.0–1.2)
Total Protein: 7.3 g/dL (ref 6.5–8.1)

## 2023-11-08 LAB — MAGNESIUM: Magnesium: 1.8 mg/dL (ref 1.7–2.4)

## 2023-11-08 NOTE — Assessment & Plan Note (Signed)
 Chemotherapy plan as listed above

## 2023-11-08 NOTE — Assessment & Plan Note (Signed)
 Stable  monitor

## 2023-11-08 NOTE — Assessment & Plan Note (Signed)
 Potassium has been stable and he has been taking potassium 20 mEq on and off.   Recommend to potassium 20meq daily.

## 2023-11-08 NOTE — Assessment & Plan Note (Signed)
#   IgG lamda multiple myeloma, status post autologous marrow transplant on 05/22/2022 08/04/2022 Bone marrow biopsy done at [facility] showed no plasma cell. - in remission.  Post transplant PET scan showed no hypermetabolic activity Labs are reviewed and discussed with patient. Counts are stable. Tolerate treatment well.  Continue Revlimid  10mg  daily  Recommend Aspirin  81mg  daily  finished Acyclovir  daily for 1 year post transplant, currently off acyclovir

## 2023-11-08 NOTE — Progress Notes (Signed)
 Hematology/Oncology Progress note Telephone:([phone] Fax:(336) [phone]     CHIEF COMPLAINTS/PURPOSE OF CONSULTATION:  Multiple myeloma  ASSESSMENT & PLAN:   Cancer Staging  Multiple myeloma in remission [facility]) Staging form: Plasma Cell Myeloma and Plasma Cell Disorders, AJCC 8th Edition - Clinical stage from 08/20/2021: RISS Stage II (Beta-2 -microglobulin (mg/L): 3.6, Albumin (g/dL): 2.2, ISS: Stage II, High-risk cytogenetics: Absent, LDH: Normal) - Signed by Babara Call, MD on 10/14/2021   Multiple myeloma in remission (HCC) # IgG lamda multiple myeloma, status post autologous marrow transplant on 05/22/2022 08/04/2022 Bone marrow biopsy done at [facility] showed no plasma cell. - in remission.  Post transplant PET scan showed no hypermetabolic activity Labs are reviewed and discussed with patient. Counts are stable. Tolerate treatment well.  Continue Revlimid  10mg  daily  Recommend Aspirin  81mg  daily  finished Acyclovir  daily for 1 year post transplant, currently off acyclovir   Encounter for antineoplastic chemotherapy Chemotherapy plan as listed above.   Hypokalemia Potassium has been stable and he has been taking potassium 20 mEq on and off.   Recommend to potassium 20meq daily.    Normocytic anemia Stable monitor.      Follow-up in 4 weeks All questions were answered. The patient knows to call the clinic with any problems, questions or concerns. No barriers to learning was detected.  Call Babara, MD 11/08/2023    HISTORY OF PRESENTING ILLNESS:  Tanner [name] 38 y.o. male presents  for follow up of Multiple myeloma I have reviewed his chart and materials related to his cancer extensively and collaborated history with the patient. Summary of oncologic history is as follows: Oncology History  Multiple myeloma in remission (HCC)  08/14/2021 Imaging   CT chest abdomen pelvis without contrast Showed numerous lucencies throughout the bone structures, most  pronounced throughout the spine and left left iliac bone.  Otherwise no acute findings in the chest abdomen or pelvis.  Sigmoid diverticulosis.   08/19/2021 Initial Diagnosis   Multiple myeloma not having achieved remission, stage II  -08/15/2021, bone marrow biopsy showed plasma cell myeloma involving greater than 80% of the marrow. Cytogenetics Myeloma  FISH   08/20/2021 Cancer Staging   Staging form: Plasma Cell Myeloma and Plasma Cell Disorders, AJCC 8th Edition - Clinical stage from 08/20/2021: RISS Stage II (Beta-2 -microglobulin (mg/L): 3.6, Albumin (g/dL): 2.2, ISS: Stage II, High-risk cytogenetics: Absent, LDH: Normal) - Signed by Babara Call, MD on 10/14/2021 Beta 2 microglobulin range (mg/L): 3.5 to 5.49 Albumin range (g/dL): Less than 3.5 Cytogenetics: No abnormalities   08/20/2021 -  Chemotherapy   MYELOMA NEWLY DIAGNOSED TRANSPLANT CANDIDATE DaraVRd (Daratumumab  SUBQ) q21d x 6 Cycles (Induction/Consolidation)      09/11/2021 Imaging   PET scan showed  1. Extensive multifocal hypermetabolic disease throughout the axial and appendicular skeleton, with numerous lucent osseous lesions,consistent with patient's known diagnosis of multiple myeloma. 2. No tracer avid lymph node or mass identified to suggest soft tissue plasmacytoma   12/18/2021 Imaging   MRI lumbar 1. Multiple scattered enhancing marrow replacing bone lesions within the imaged spine and pelvis consistent with known history of multiple myeloma. No pathologic fracture or extraosseous soft tissue component. 2. Unchanged mild multilevel degenerative changes of the lumbar spine resulting in mild canal stenosis at L2-L3 and L3-L4.  3. Left greater than right subarticular recess stenosis and mild left foraminal stenosis at L5-S1.      02/25/2022 Imaging   PET  1. Decreased FDG avidity associated with the multifocal hypermetabolic osseous lesions, consistent with treatment response. 2. Unchanged  pathologic fracture of the T8  superior endplate. 3. Prominent bilateral cervical lymph nodes are similar in size to prior but now demonstrate mildly metabolic activity, nonspecific but favored to be reactive. Attention on follow-up imaging suggested 4. Hypermetabolic hyperplasia of the tonsils, also favored reactive.   05/22/2022 Bone Marrow Transplant   Patient underwent autologous stem cell transplant. 05/30/2022, neutropenic fever.   06/03/2022 engraftment 06/04/2022, G-CSF stopped.    08/21/2022 Imaging   PET  No evidence of hypermetabolic myeloma.      INTERVAL HISTORY Tanner [name] is a 38 y.o. male who has above history reviewed by me today presents for follow up visit for management of multiple myeloma Patient is status post autologous stem cell transplant.   He is doing well.he takes Revlimid  10mg  daily. He tolerates well. He denies fever, chills, nausea vomiting diarrhea. He gets immunization at [facility].    MEDICAL HISTORY:  Past Medical History:  Diagnosis Date   Alcohol use    Sciatica    Smoking     SURGICAL HISTORY: Past Surgical History:  Procedure Laterality Date   INGUINAL HERNIA REPAIR      SOCIAL HISTORY: Social History   Socioeconomic History   Marital status: Single    Spouse name: Not on file   Number of children: Not on file   Years of education: Not on file   Highest education level: Not on file  Occupational History   Not on file  Tobacco Use   Smoking status: Former    Current packs/day: 0.00    Average packs/day: 0.5 packs/day for 10.0 years (5.0 ttl pk-yrs)    Types: E-cigarettes, Cigarettes    Start date: 08/12/2011    Quit date: 08/11/2021    Years since quitting: 2.2   Smokeless tobacco: Never  Vaping Use   Vaping status: Every Day  Substance and Sexual Activity   Alcohol use: Not Currently    Alcohol/week: 24.0 standard drinks of alcohol    Types: 24 Cans of beer per week    Comment: quit drinking approx 08/11/2021   Drug use: Never   Sexual activity:  Not on file  Other Topics Concern   Not on file  Social History Narrative   Not on file   Social Drivers of Health   Financial Resource Strain: Low Risk  (05/30/2022)   Received from [facility]   Overall Financial Resource Strain (CARDIA)    Difficulty of Paying Living Expenses: Not hard at all  Food Insecurity: No Food Insecurity (05/30/2022)   Received from [facility]   Hunger Vital Sign    Within the past 12 months, you worried that your food would run out before you got the money to buy more.: Never true    Within the past 12 months, the food you bought just didn't last and you didn't have money to get more.: Never true  Transportation Needs: No Transportation Needs (06/01/2022)   Received from [facility] - Transportation    In the past 12 months, has lack of transportation kept you from medical appointments or from getting medications?: No    Lack of Transportation (Non-Medical): No  Physical Activity: Not on file  Stress: Stress Concern Present (01/13/2022)   Received from [facility] - Occupational Stress Questionnaire    Feeling of Stress : To some extent  Social Connections: Not on file  Intimate Partner Violence: Not  on file    FAMILY HISTORY: Family History  Problem Relation Age of Onset   Ovarian cancer Mother    Asthma Mother    Alcoholism Father    Throat cancer Maternal Uncle    Heart disease Maternal Uncle    Lung cancer Maternal Uncle    Lymphoma Maternal Uncle    Lung cancer Paternal Grandmother     ALLERGIES:  has no known allergies.  MEDICATIONS:  Current Outpatient Medications  Medication Sig Dispense Refill   acyclovir  (ZOVIRAX ) 400 MG tablet Take 400 mg by mouth 2 (two) times daily.     aspirin  EC 81 MG tablet Take 81 mg by mouth daily. Swallow whole.     CALCIUM /VITAMIN D3 GUMMIES 200-5 MG-MCG CHEW TAKE 2 TABLETS BY MOUTH  EVERY DAY 60 tablet 5   Cyanocobalamin  1000 MCG SUBL Take 1,000 mcg by mouth.     lenalidomide  (REVLIMID ) 10 MG capsule Take 1 capsule (10 mg total) by mouth daily. [employer] Auth # [id number]   Date Obtained 11/05/23 28 capsule 0   ondansetron  (ZOFRAN ) 8 MG tablet Take 1 tablet (8 mg total) by mouth 2 (two) times daily as needed for nausea or vomiting. 20 tablet 2   potassium chloride  SA (KLOR-CON  M20) 20 MEQ tablet Take 1 tablet (20 mEq total) by mouth daily. 90 tablet 0   senna-docusate (SENOKOT-S) 8.6-50 MG tablet Take 1 tablet by mouth daily. 60 tablet 2   No current facility-administered medications for this visit.    Review of Systems  Constitutional:  Negative for appetite change, chills, fatigue, fever and unexpected weight change.  HENT:   Negative for hearing loss, sore throat and voice change.   Eyes:  Negative for eye problems and icterus.  Respiratory:  Negative for chest tightness, cough and shortness of breath.   Cardiovascular:  Negative for chest pain and leg swelling.  Gastrointestinal:  Negative for abdominal distention and abdominal pain.  Endocrine: Negative for hot flashes.  Genitourinary:  Negative for difficulty urinating, dysuria and frequency.   Musculoskeletal:  Negative for arthralgias and back pain.  Skin:  Negative for itching and rash.  Neurological:  Negative for light-headedness and numbness.  Hematological:  Negative for adenopathy. Does not bruise/bleed easily.  Psychiatric/Behavioral:  Negative for confusion.      PHYSICAL EXAMINATION: ECOG PERFORMANCE STATUS: 1 - Symptomatic but completely ambulatory  Vitals:   11/08/23 1059  BP: (!) 132/90  Pulse: 96  Resp: 18  Temp: 98.8 F (37.1 C)   Filed Weights   11/08/23 1059  Weight: 269 lb 8 oz (122.2 kg)     Physical Exam Constitutional:      General: He is not in acute distress.    Appearance: He is obese. He is not diaphoretic.  HENT:     Head: Normocephalic and atraumatic.      Mouth/Throat:     Pharynx: No oropharyngeal exudate.  Eyes:     General: No scleral icterus. Cardiovascular:     Rate and Rhythm: Normal rate.     Heart sounds: No murmur heard. Pulmonary:     Effort: Pulmonary effort is normal. No respiratory distress.  Abdominal:     General: There is no distension.     Palpations: Abdomen is soft.  Musculoskeletal:        General: Normal range of motion.     Cervical back: Normal range of motion.  Skin:    General: Skin is warm and dry.     Findings: No erythema.  Neurological:     Mental Status: He is alert and oriented to person, place, and time. Mental status is at baseline.     Cranial Nerves: No cranial nerve deficit.     Motor: No abnormal muscle tone.  Psychiatric:        Mood and Affect: Mood and affect normal.      LABORATORY DATA:  I have reviewed the data as listed    Latest Ref Rng & Units 11/08/2023   10:43 AM 10/11/2023    9:51 AM 09/07/2023   10:32 AM  CBC  WBC 4.0 - 10.5 K/uL 6.7  5.8  5.1   Hemoglobin 13.0 - 17.0 g/dL 88.0  87.5  87.8   Hematocrit 39.0 - 52.0 % 35.3  36.5  35.8   Platelets 150 - 400 K/uL 229  269  270       Latest Ref Rng & Units 11/08/2023   10:43 AM 10/11/2023    9:51 AM 09/07/2023   10:32 AM  CMP  Glucose 70 - 99 mg/dL 895  97  895   BUN 6 - 20 mg/dL 13  15  13    Creatinine 0.61 - 1.24 mg/dL 8.96  8.86  9.06   Sodium 135 - 145 mmol/L 133  133  133   Potassium 3.5 - 5.1 mmol/L 3.4  3.8  3.8   Chloride 98 - 111 mmol/L 101  101  103   CO2 22 - 32 mmol/L 21  24  21    Calcium  8.9 - 10.3 mg/dL 8.9  9.1  9.3   Total Protein 6.5 - 8.1 g/dL 7.3  7.6  7.7   Total Bilirubin 0.0 - 1.2 mg/dL 0.8  1.0  0.9   Alkaline Phos 38 - 126 U/L 60  72  59   AST 15 - 41 U/L 62  33  45   ALT 0 - 44 U/L 68  39  47        RADIOGRAPHIC STUDIES: I have personally reviewed the radiological images as listed and agreed with the findings in the report. No results found.

## 2023-11-09 ENCOUNTER — Other Ambulatory Visit (HOSPITAL_COMMUNITY): Payer: Self-pay

## 2023-11-09 ENCOUNTER — Other Ambulatory Visit: Payer: Self-pay

## 2023-11-09 LAB — KAPPA/LAMBDA LIGHT CHAINS
Kappa free light chain: 11.7 mg/L (ref 3.3–19.4)
Kappa, lambda light chain ratio: 1.15 (ref 0.26–1.65)
Lambda free light chains: 10.2 mg/L (ref 5.7–26.3)

## 2023-11-12 LAB — MULTIPLE MYELOMA PANEL, SERUM
Albumin SerPl Elph-Mcnc: 3.5 g/dL (ref 2.9–4.4)
Albumin/Glob SerPl: 1.1 (ref 0.7–1.7)
Alpha 1: 0.2 g/dL (ref 0.0–0.4)
Alpha2 Glob SerPl Elph-Mcnc: 0.6 g/dL (ref 0.4–1.0)
B-Globulin SerPl Elph-Mcnc: 1.3 g/dL (ref 0.7–1.3)
Gamma Glob SerPl Elph-Mcnc: 1.1 g/dL (ref 0.4–1.8)
Globulin, Total: 3.3 g/dL (ref 2.2–3.9)
IgA: 162 mg/dL (ref 90–386)
IgG (Immunoglobin G), Serum: 1235 mg/dL (ref 603–1613)
IgM (Immunoglobulin M), Srm: 44 mg/dL (ref 20–172)
Total Protein ELP: 6.8 g/dL (ref 6.0–8.5)

## 2023-11-30 ENCOUNTER — Other Ambulatory Visit: Payer: Self-pay | Admitting: Oncology

## 2023-11-30 DIAGNOSIS — C9000 Multiple myeloma not having achieved remission: Secondary | ICD-10-CM

## 2023-12-01 ENCOUNTER — Other Ambulatory Visit: Payer: Self-pay

## 2023-12-13 ENCOUNTER — Inpatient Hospital Stay: Attending: Oncology

## 2023-12-13 ENCOUNTER — Encounter: Payer: Self-pay | Admitting: Oncology

## 2023-12-13 ENCOUNTER — Inpatient Hospital Stay: Admitting: Oncology

## 2023-12-13 VITALS — BP 132/82 | HR 106 | Temp 97.8°F | Resp 18 | Wt 267.5 lb

## 2023-12-13 DIAGNOSIS — Z5111 Encounter for antineoplastic chemotherapy: Secondary | ICD-10-CM | POA: Diagnosis not present

## 2023-12-13 DIAGNOSIS — E876 Hypokalemia: Secondary | ICD-10-CM | POA: Diagnosis not present

## 2023-12-13 DIAGNOSIS — C9001 Multiple myeloma in remission: Secondary | ICD-10-CM

## 2023-12-13 DIAGNOSIS — C7951 Secondary malignant neoplasm of bone: Secondary | ICD-10-CM

## 2023-12-13 DIAGNOSIS — F17290 Nicotine dependence, other tobacco product, uncomplicated: Secondary | ICD-10-CM | POA: Insufficient documentation

## 2023-12-13 DIAGNOSIS — Z9484 Stem cells transplant status: Secondary | ICD-10-CM | POA: Insufficient documentation

## 2023-12-13 DIAGNOSIS — Z808 Family history of malignant neoplasm of other organs or systems: Secondary | ICD-10-CM | POA: Diagnosis not present

## 2023-12-13 DIAGNOSIS — C9000 Multiple myeloma not having achieved remission: Secondary | ICD-10-CM

## 2023-12-13 DIAGNOSIS — Z79899 Other long term (current) drug therapy: Secondary | ICD-10-CM | POA: Insufficient documentation

## 2023-12-13 DIAGNOSIS — Z807 Family history of other malignant neoplasms of lymphoid, hematopoietic and related tissues: Secondary | ICD-10-CM | POA: Diagnosis not present

## 2023-12-13 DIAGNOSIS — R7989 Other specified abnormal findings of blood chemistry: Secondary | ICD-10-CM

## 2023-12-13 DIAGNOSIS — Z801 Family history of malignant neoplasm of trachea, bronchus and lung: Secondary | ICD-10-CM | POA: Diagnosis not present

## 2023-12-13 DIAGNOSIS — Z8041 Family history of malignant neoplasm of ovary: Secondary | ICD-10-CM | POA: Insufficient documentation

## 2023-12-13 LAB — CBC WITH DIFFERENTIAL (CANCER CENTER ONLY)
Abs Immature Granulocytes: 0.04 K/uL (ref 0.00–0.07)
Basophils Absolute: 0 K/uL (ref 0.0–0.1)
Basophils Relative: 1 %
Eosinophils Absolute: 0.2 K/uL (ref 0.0–0.5)
Eosinophils Relative: 3 %
HCT: 41.6 % (ref 39.0–52.0)
Hemoglobin: 13.9 g/dL (ref 13.0–17.0)
Immature Granulocytes: 1 %
Lymphocytes Relative: 26 %
Lymphs Abs: 1.5 K/uL (ref 0.7–4.0)
MCH: 27.2 pg (ref 26.0–34.0)
MCHC: 33.4 g/dL (ref 30.0–36.0)
MCV: 81.4 fL (ref 80.0–100.0)
Monocytes Absolute: 0.6 K/uL (ref 0.1–1.0)
Monocytes Relative: 10 %
Neutro Abs: 3.5 K/uL (ref 1.7–7.7)
Neutrophils Relative %: 59 %
Platelet Count: 271 K/uL (ref 150–400)
RBC: 5.11 MIL/uL (ref 4.22–5.81)
RDW: 14.2 % (ref 11.5–15.5)
WBC Count: 5.8 K/uL (ref 4.0–10.5)
nRBC: 0 % (ref 0.0–0.2)

## 2023-12-13 LAB — CMP (CANCER CENTER ONLY)
ALT: 69 U/L — ABNORMAL HIGH (ref 0–44)
AST: 44 U/L — ABNORMAL HIGH (ref 15–41)
Albumin: 4.6 g/dL (ref 3.5–5.0)
Alkaline Phosphatase: 67 U/L (ref 38–126)
Anion gap: 16 — ABNORMAL HIGH (ref 5–15)
BUN: 10 mg/dL (ref 6–20)
CO2: 21 mmol/L — ABNORMAL LOW (ref 22–32)
Calcium: 10.2 mg/dL (ref 8.9–10.3)
Chloride: 100 mmol/L (ref 98–111)
Creatinine: 1.07 mg/dL (ref 0.61–1.24)
GFR, Estimated: >60 mL/min (ref >60)
Glucose, Bld: 98 mg/dL (ref 70–99)
Potassium: 4.1 mmol/L (ref 3.5–5.1)
Sodium: 137 mmol/L (ref 135–145)
Total Bilirubin: 0.4 mg/dL (ref 0.0–1.2)
Total Protein: 7.7 g/dL (ref 6.5–8.1)

## 2023-12-13 NOTE — Assessment & Plan Note (Signed)
 Chemotherapy plan as listed above

## 2023-12-13 NOTE — Assessment & Plan Note (Signed)
 Potassium has been stable and he has been taking potassium 20 mEq on and off.   Recommend to potassium 20meq daily.

## 2023-12-13 NOTE — Assessment & Plan Note (Addendum)
 Recommendations avoid alcohol use.  Levels are slightly improved.  Consider right upper quadrant ultrasound if progressively worse.

## 2023-12-13 NOTE — Assessment & Plan Note (Signed)
 08/21/22 PET showed no active disease.  dental clearance obtained.  Recommend Zometa Q3 months. . Recommend calcium and vitamin D supplementation

## 2023-12-13 NOTE — Assessment & Plan Note (Signed)
 Normal magnesium  level .  He has been off Slow-Mag.  Monitor magnesium  level monthly.

## 2023-12-13 NOTE — Assessment & Plan Note (Signed)
#   IgG lamda multiple myeloma, status post autologous marrow transplant on 05/22/2022 08/04/2022 Bone marrow biopsy done at [facility] showed no plasma cell. - in remission.  Post transplant PET scan showed no hypermetabolic activity Labs are reviewed and discussed with patient. Counts are stable. Tolerate treatment well.  Continue Revlimid  10mg  daily  Recommend Aspirin  81mg  daily  finished Acyclovir  daily for 1 year post transplant, currently off acyclovir

## 2023-12-13 NOTE — Progress Notes (Signed)
 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 - in remission.  Post transplant PET scan showed no hypermetabolic activity Labs are reviewed and discussed with patient. Counts are stable. Tolerate treatment well.  Continue Revlimid  10mg  daily  Recommend Aspirin  81mg  daily  finished Acyclovir  daily for 1 year post transplant, currently off acyclovir   Encounter for antineoplastic chemotherapy Chemotherapy plan as listed above.   Hypokalemia Potassium has been stable and he has been taking potassium 20 mEq on and off.   Recommend to potassium 20meq daily.    Metastatic cancer to bone (HCC) 08/21/22 PET showed no active disease.  dental clearance obtained.  Recommend Zometa  Q3 months. . Recommend calcium  and vitamin D supplementation      Hypomagnesemia Normal magnesium  level .  He has been off Slow-Mag.  Monitor magnesium  level monthly.   Abnormal LFTs Recommendations avoid alcohol use.  Levels are slightly improved.  Consider right upper quadrant ultrasound if progressively worse.      Follow-up in 4 weeks All questions were answered. The patient knows to call the clinic with any problems, questions or concerns. No barriers to learning was detected.  Call Babara, MD 12/13/2023    HISTORY OF PRESENTING ILLNESS:  Tanner [name]  38 y.o. male presents  for follow up of Multiple myeloma I have reviewed his chart and materials related to his cancer extensively and collaborated history with the patient. Summary of oncologic history is as follows: Oncology History  Multiple myeloma in remission (HCC)  08/14/2021 Imaging   CT chest abdomen pelvis without contrast Showed numerous lucencies throughout the bone structures, most pronounced throughout the spine and left left iliac bone.  Otherwise no acute findings in the chest abdomen or pelvis.  Sigmoid diverticulosis.   08/19/2021 Initial Diagnosis   Multiple myeloma not having achieved remission, stage II  -08/15/2021, bone marrow biopsy showed plasma cell myeloma involving greater than 80% of the marrow. Cytogenetics Myeloma  FISH   08/20/2021 Cancer Staging   Staging form: Plasma Cell Myeloma and Plasma Cell Disorders, AJCC 8th Edition - Clinical stage from 08/20/2021: RISS Stage II (Beta-2 -microglobulin (mg/L): 3.6, Albumin (g/dL): 2.2, ISS: Stage II, High-risk cytogenetics: Absent, LDH: Normal) - Signed by Babara Call, MD on 10/14/2021 Beta 2 microglobulin range (mg/L): 3.5 to 5.49 Albumin range (g/dL): Less than 3.5 Cytogenetics: No abnormalities   08/20/2021 -  Chemotherapy   MYELOMA NEWLY DIAGNOSED TRANSPLANT CANDIDATE DaraVRd (Daratumumab  SUBQ) q21d x 6 Cycles (Induction/Consolidation)      09/11/2021 Imaging   PET scan showed  1. Extensive multifocal hypermetabolic disease throughout the axial and appendicular skeleton, with numerous lucent osseous lesions,consistent with patient's known diagnosis of multiple myeloma. 2. No tracer avid lymph node or mass identified to suggest soft tissue plasmacytoma   12/18/2021 Imaging   MRI lumbar 1. Multiple scattered enhancing marrow replacing bone lesions within the imaged spine and pelvis consistent with known history of multiple myeloma. No pathologic fracture or extraosseous  soft tissue component. 2. Unchanged mild  multilevel degenerative changes of the lumbar spine resulting in mild canal stenosis at L2-L3 and L3-L4.  3. Left greater than right subarticular recess stenosis and mild left foraminal stenosis at L5-S1.      02/25/2022 Imaging   PET  1. Decreased FDG avidity associated with the multifocal hypermetabolic osseous lesions, consistent with treatment response. 2. Unchanged pathologic fracture of the T8 superior endplate. 3. Prominent bilateral cervical lymph nodes are similar in size to prior but now demonstrate mildly metabolic activity, nonspecific but favored to be reactive. Attention on follow-up imaging suggested 4. Hypermetabolic hyperplasia of the tonsils, also favored reactive.   05/22/2022 Bone Marrow Transplant   Patient underwent autologous stem cell transplant. 05/30/2022, neutropenic fever.   06/03/2022 engraftment 06/04/2022, G-CSF stopped.    08/21/2022 Imaging   PET  No evidence of hypermetabolic myeloma.      INTERVAL HISTORY Tanner [name] is a 38 y.o. male who has above history reviewed by me today presents for follow up visit for management of multiple myeloma Patient is status post autologous stem cell transplant.   He is doing well.he takes Revlimid  10mg  daily. He tolerates well. He denies fever, chills, nausea vomiting diarrhea. He gets immunization [facility].    MEDICAL HISTORY:  Past Medical History:  Diagnosis Date   Alcohol use    Sciatica    Smoking     SURGICAL HISTORY: Past Surgical History:  Procedure Laterality Date   INGUINAL HERNIA REPAIR      SOCIAL HISTORY: Social History   Socioeconomic History   Marital status: Single    Spouse name: Not on file   Number of children: Not on file   Years of education: Not on file   Highest education level: Not on file  Occupational History   Not on file  Tobacco Use   Smoking status: Former    Current packs/day: 0.00    Average packs/day: 0.5 packs/day for 10.0 years (5.0 ttl pk-yrs)     Types: E-cigarettes, Cigarettes    Start date: 08/12/2011    Quit date: 08/11/2021    Years since quitting: 2.3   Smokeless tobacco: Never  Vaping Use   Vaping status: Every Day  Substance and Sexual Activity   Alcohol use: Not Currently    Alcohol/week: 24.0 standard drinks of alcohol    Types: 24 Cans of beer per week    Comment: quit drinking approx 08/11/2021   Drug use: Never   Sexual activity: Not on file  Other Topics Concern   Not on file  Social History Narrative   Not on file   Social Drivers of Health   Financial Resource Strain: Low Risk  (05/30/2022)   Received from [facility]   Overall Financial Resource Strain (CARDIA)    Difficulty of Paying Living Expenses: Not hard at all  Food Insecurity: No Food Insecurity (05/30/2022)   Received from [facility]   Hunger Vital Sign    Within the past 12 months, you worried that your food would run out before you got the money to buy more.: Never true    Within the past 12 months, the food you bought just didn't last and you didn't have money to get more.: Never true  Transportation Needs: No Transportation Needs (06/01/2022)   Received from [facility] - Transportation    In the past 12 months, has lack of transportation kept you from medical  appointments or from getting medications?: No    Lack of Transportation (Non-Medical): No  Physical Activity: Not on file  Stress: Stress Concern Present (01/13/2022)   Received from [facility] - Occupational Stress Questionnaire    Feeling of Stress : To some extent  Social Connections: Not on file  Intimate Partner Violence: Not on file    FAMILY HISTORY: Family History  Problem Relation Age of Onset   Ovarian cancer Mother    Asthma Mother    Alcoholism Father    Throat cancer Maternal Uncle    Heart disease Maternal Uncle    Lung cancer Maternal Uncle     Lymphoma Maternal Uncle    Lung cancer Paternal Grandmother     ALLERGIES:  has no known allergies.  MEDICATIONS:  Current Outpatient Medications  Medication Sig Dispense Refill   acyclovir  (ZOVIRAX ) 400 MG tablet Take 400 mg by mouth 2 (two) times daily.     aspirin  EC 81 MG tablet Take 81 mg by mouth daily. Swallow whole.     CALCIUM /VITAMIN D3 GUMMIES 200-5 MG-MCG CHEW TAKE 2 TABLETS BY MOUTH EVERY DAY 60 tablet 5   Cyanocobalamin  1000 MCG SUBL Take 1,000 mcg by mouth.     lenalidomide  (REVLIMID ) 10 MG capsule TAKE 1 CAPSULE BY MOUTH 1 TIME A DAY 28 capsule 0   ondansetron  (ZOFRAN ) 8 MG tablet Take 1 tablet (8 mg total) by mouth 2 (two) times daily as needed for nausea or vomiting. 20 tablet 2   potassium chloride  SA (KLOR-CON  M20) 20 MEQ tablet Take 1 tablet (20 mEq total) by mouth daily. 90 tablet 0   senna-docusate (SENOKOT-S) 8.6-50 MG tablet Take 1 tablet by mouth daily. 60 tablet 2   No current facility-administered medications for this visit.    Review of Systems  Constitutional:  Negative for appetite change, chills, fatigue, fever and unexpected weight change.  HENT:   Negative for hearing loss, sore throat and voice change.   Eyes:  Negative for eye problems and icterus.  Respiratory:  Negative for chest tightness, cough and shortness of breath.   Cardiovascular:  Negative for chest pain and leg swelling.  Gastrointestinal:  Negative for abdominal distention and abdominal pain.  Endocrine: Negative for hot flashes.  Genitourinary:  Negative for difficulty urinating, dysuria and frequency.   Musculoskeletal:  Negative for arthralgias and back pain.  Skin:  Negative for itching and rash.  Neurological:  Negative for light-headedness and numbness.  Hematological:  Negative for adenopathy. Does not bruise/bleed easily.  Psychiatric/Behavioral:  Negative for confusion.      PHYSICAL EXAMINATION: ECOG PERFORMANCE STATUS: 1 - Symptomatic but completely  ambulatory  Vitals:   12/13/23 0953  BP: 132/82  Pulse: (!) 106  Resp: 18  Temp: 97.8 F (36.6 C)  SpO2: 100%   Filed Weights   12/13/23 0953  Weight: 267 lb 8 oz (121.3 kg)     Physical Exam Constitutional:      General: He is not in acute distress.    Appearance: He is obese. He is not diaphoretic.  HENT:     Head: Normocephalic and atraumatic.     Mouth/Throat:     Pharynx: No oropharyngeal exudate.  Eyes:     General: No scleral icterus. Cardiovascular:     Rate and Rhythm: Normal rate.     Heart sounds: No murmur heard. Pulmonary:     Effort: Pulmonary effort is normal. No respiratory distress.  Abdominal:     General: There is no distension.     Palpations: Abdomen is soft.  Musculoskeletal:        General: Normal range of motion.     Cervical back: Normal range of motion.  Skin:    General: Skin is warm and dry.     Findings: No erythema.  Neurological:     Mental Status: He is alert and oriented to person, place, and time. Mental status is at baseline.     Cranial Nerves: No cranial nerve deficit.     Motor: No abnormal muscle tone.  Psychiatric:        Mood and Affect: Mood and affect normal.      LABORATORY DATA:  I have reviewed the data as listed    Latest Ref Rng & Units 12/13/2023    9:42 AM 11/08/2023   10:43 AM 10/11/2023    9:51 AM  CBC  WBC 4.0 - 10.5 K/uL 5.8  6.7  5.8   Hemoglobin 13.0 - 17.0 g/dL 86.0  88.0  87.5   Hematocrit 39.0 - 52.0 % 41.6  35.3  36.5   Platelets 150 - 400 K/uL 271  229  269       Latest Ref Rng & Units 12/13/2023    9:42 AM 11/08/2023   10:43 AM 10/11/2023    9:51 AM  CMP  Glucose 70 - 99 mg/dL 98  895  97   BUN 6 - 20 mg/dL 10  13  15    Creatinine 0.61 - 1.24 mg/dL 8.92  8.96  8.86   Sodium 135 - 145 mmol/L 137  133  133   Potassium 3.5 - 5.1 mmol/L 4.1  3.4  3.8   Chloride 98 - 111 mmol/L 100  101  101   CO2 22 - 32 mmol/L 21  21  24    Calcium  8.9 - 10.3 mg/dL 89.7  8.9  9.1   Total Protein 6.5 - 8.1  g/dL 7.7  7.3  7.6   Total Bilirubin 0.0 - 1.2 mg/dL 0.4  0.8  1.0   Alkaline Phos 38 - 126 U/L 67  60  72   AST 15 - 41 U/L 44  62  33   ALT 0 - 44 U/L 69  68  39        RADIOGRAPHIC STUDIES: I have personally reviewed the radiological images as listed and agreed with the findings in the report. No results found.

## 2023-12-14 LAB — KAPPA/LAMBDA LIGHT CHAINS
Kappa free light chain: 12.5 mg/L (ref 3.3–19.4)
Kappa, lambda light chain ratio: 1 (ref 0.26–1.65)
Lambda free light chains: 12.5 mg/L (ref 5.7–26.3)

## 2023-12-15 LAB — MULTIPLE MYELOMA PANEL, SERUM
Albumin SerPl Elph-Mcnc: 4.1 g/dL (ref 2.9–4.4)
Albumin/Glob SerPl: 1.3 (ref 0.7–1.7)
Alpha 1: 0.2 g/dL (ref 0.0–0.4)
Alpha2 Glob SerPl Elph-Mcnc: 0.6 g/dL (ref 0.4–1.0)
B-Globulin SerPl Elph-Mcnc: 1.3 g/dL (ref 0.7–1.3)
Gamma Glob SerPl Elph-Mcnc: 1.2 g/dL (ref 0.4–1.8)
Globulin, Total: 3.3 g/dL (ref 2.2–3.9)
IgA: 172 mg/dL (ref 90–386)
IgG (Immunoglobin G), Serum: 1333 mg/dL (ref 603–1613)
IgM (Immunoglobulin M), Srm: 40 mg/dL (ref 20–172)
Total Protein ELP: 7.4 g/dL (ref 6.0–8.5)

## 2024-01-25 ENCOUNTER — Encounter: Payer: Self-pay | Admitting: Oncology

## 2024-01-25 ENCOUNTER — Other Ambulatory Visit: Payer: Self-pay

## 2024-01-25 ENCOUNTER — Ambulatory Visit: Payer: Self-pay | Admitting: Oncology

## 2024-01-25 ENCOUNTER — Inpatient Hospital Stay: Attending: Oncology

## 2024-01-25 ENCOUNTER — Inpatient Hospital Stay

## 2024-01-25 ENCOUNTER — Inpatient Hospital Stay: Admitting: Oncology

## 2024-01-25 VITALS — BP 106/78 | HR 95 | Temp 97.6°F | Resp 20 | Wt 274.0 lb

## 2024-01-25 DIAGNOSIS — R7989 Other specified abnormal findings of blood chemistry: Secondary | ICD-10-CM

## 2024-01-25 DIAGNOSIS — C9001 Multiple myeloma in remission: Secondary | ICD-10-CM | POA: Diagnosis not present

## 2024-01-25 DIAGNOSIS — E876 Hypokalemia: Secondary | ICD-10-CM | POA: Diagnosis not present

## 2024-01-25 DIAGNOSIS — Z5111 Encounter for antineoplastic chemotherapy: Secondary | ICD-10-CM | POA: Diagnosis not present

## 2024-01-25 LAB — CBC WITH DIFFERENTIAL (CANCER CENTER ONLY)
Abs Immature Granulocytes: 0.04 K/uL (ref 0.00–0.07)
Basophils Absolute: 0 K/uL (ref 0.0–0.1)
Basophils Relative: 1 %
Eosinophils Absolute: 0.3 K/uL (ref 0.0–0.5)
Eosinophils Relative: 4 %
HCT: 37.7 % — ABNORMAL LOW (ref 39.0–52.0)
Hemoglobin: 12.7 g/dL — ABNORMAL LOW (ref 13.0–17.0)
Immature Granulocytes: 1 %
Lymphocytes Relative: 25 %
Lymphs Abs: 1.6 K/uL (ref 0.7–4.0)
MCH: 27.4 pg (ref 26.0–34.0)
MCHC: 33.7 g/dL (ref 30.0–36.0)
MCV: 81.4 fL (ref 80.0–100.0)
Monocytes Absolute: 0.6 K/uL (ref 0.1–1.0)
Monocytes Relative: 9 %
Neutro Abs: 4 K/uL (ref 1.7–7.7)
Neutrophils Relative %: 60 %
Platelet Count: 250 K/uL (ref 150–400)
RBC: 4.63 MIL/uL (ref 4.22–5.81)
RDW: 14.4 % (ref 11.5–15.5)
WBC Count: 6.5 K/uL (ref 4.0–10.5)
nRBC: 0 % (ref 0.0–0.2)

## 2024-01-25 LAB — CMP (CANCER CENTER ONLY)
ALT: 113 U/L — ABNORMAL HIGH (ref 0–44)
AST: 76 U/L — ABNORMAL HIGH (ref 15–41)
Albumin: 4.5 g/dL (ref 3.5–5.0)
Alkaline Phosphatase: 80 U/L (ref 38–126)
Anion gap: 12 (ref 5–15)
BUN: 13 mg/dL (ref 6–20)
CO2: 25 mmol/L (ref 22–32)
Calcium: 9.6 mg/dL (ref 8.9–10.3)
Chloride: 99 mmol/L (ref 98–111)
Creatinine: 1.05 mg/dL (ref 0.61–1.24)
GFR, Estimated: >60 mL/min
Glucose, Bld: 103 mg/dL — ABNORMAL HIGH (ref 70–99)
Potassium: 4.2 mmol/L (ref 3.5–5.1)
Sodium: 136 mmol/L (ref 135–145)
Total Bilirubin: 0.8 mg/dL (ref 0.0–1.2)
Total Protein: 7.7 g/dL (ref 6.5–8.1)

## 2024-01-25 LAB — MAGNESIUM: Magnesium: 2 mg/dL (ref 1.7–2.4)

## 2024-01-25 MED ORDER — ZOLEDRONIC ACID 4 MG/100ML IV SOLN
4.0000 mg | Freq: Once | INTRAVENOUS | Status: AC
  Administered 2024-01-25: 4 mg via INTRAVENOUS
  Filled 2024-01-25: qty 100

## 2024-01-25 MED ORDER — SODIUM CHLORIDE 0.9 % IV SOLN
INTRAVENOUS | Status: DC
  Filled 2024-01-25: qty 250

## 2024-01-25 NOTE — Assessment & Plan Note (Signed)
#   IgG lamda multiple myeloma, status post autologous marrow transplant on 05/22/2022 08/04/2022 Bone marrow biopsy done at [facility] showed no plasma cell. - in remission.  Post transplant PET scan showed no hypermetabolic activity Labs are reviewed and discussed with patient. Counts are stable. Tolerate treatment well.  Continue Revlimid  10mg  daily  Recommend Aspirin  81mg  daily  finished Acyclovir  daily for 1 year post transplant, currently off acyclovir

## 2024-01-25 NOTE — Assessment & Plan Note (Signed)
 Potassium has been stable  Recommend  potassium 20meq daily.

## 2024-01-25 NOTE — Assessment & Plan Note (Signed)
 Recommendations avoid alcohol use.  Levels are worse.  Check right upper quadrant ultrasound

## 2024-01-25 NOTE — Assessment & Plan Note (Signed)
 Chemotherapy plan as listed above

## 2024-01-25 NOTE — Progress Notes (Signed)
 " Hematology/Oncology Progress note [phone] [phone]     CHIEF COMPLAINTS/PURPOSE OF CONSULTATION:  Multiple myeloma  ASSESSMENT & PLAN:   Cancer Staging  Multiple myeloma in remission [facility]) Staging form: Plasma Cell Myeloma and Plasma Cell Disorders, AJCC 8th Edition - Clinical stage from 08/20/2021: RISS Stage II (Beta-2 -microglobulin (mg/L): 3.6, Albumin (g/dL): 2.2, ISS: Stage II, High-risk cytogenetics: Absent, LDH: Normal) - Signed by Babara Call, MD on 10/14/2021   Multiple myeloma in remission (HCC) # IgG lamda multiple myeloma, 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 - in remission.  Post transplant PET scan showed no hypermetabolic activity Labs are reviewed and discussed with patient. Counts are stable. Tolerate treatment well.  Continue Revlimid  10mg  daily  Recommend Aspirin  81mg  daily  finished Acyclovir  daily for 1 year post transplant, currently off acyclovir   Abnormal LFTs Recommendations avoid alcohol use.  Levels are worse.  Check right upper quadrant ultrasound   Encounter for antineoplastic chemotherapy Chemotherapy plan as listed above.   Hypokalemia Potassium has been stable  Recommend  potassium 20meq daily.     Follow-up in 4 weeks All questions were answered. The patient knows to call the clinic with any problems, questions or concerns. No barriers to learning was detected.  Call Babara, MD 01/25/2024    HISTORY OF PRESENTING ILLNESS:  Tanner [name] 39 y.o. male presents  for follow up of Multiple myeloma I have reviewed his chart and materials related to his cancer extensively and collaborated history with the patient. Summary of oncologic history is as follows: Oncology History  Multiple myeloma in remission (HCC)  08/14/2021 Imaging   CT chest abdomen pelvis without contrast Showed numerous lucencies throughout the bone  structures, most pronounced throughout the spine and left left iliac bone.  Otherwise no acute findings in the chest abdomen or pelvis.  Sigmoid diverticulosis.   08/19/2021 Initial Diagnosis   Multiple myeloma not having achieved remission, stage II  -08/15/2021, bone marrow biopsy showed plasma cell myeloma involving greater than 80% of the marrow. Cytogenetics Myeloma  FISH   08/20/2021 Cancer Staging   Staging form: Plasma Cell Myeloma and Plasma Cell Disorders, AJCC 8th Edition - Clinical stage from 08/20/2021: RISS Stage II (Beta-2 -microglobulin (mg/L): 3.6, Albumin (g/dL): 2.2, ISS: Stage II, High-risk cytogenetics: Absent, LDH: Normal) - Signed by Babara Call, MD on 10/14/2021 Beta 2 microglobulin range (mg/L): 3.5 to 5.49 Albumin range (g/dL): Less than 3.5 Cytogenetics: No abnormalities   08/20/2021 -  Chemotherapy   MYELOMA NEWLY DIAGNOSED TRANSPLANT CANDIDATE DaraVRd (Daratumumab  SUBQ) q21d x 6 Cycles (Induction/Consolidation)      09/11/2021 Imaging   PET scan showed  1. Extensive multifocal hypermetabolic disease throughout the axial and appendicular skeleton, with numerous lucent osseous lesions,consistent with patient's known diagnosis of multiple myeloma. 2. No tracer avid lymph node or mass identified to suggest soft tissue plasmacytoma   12/18/2021 Imaging   MRI lumbar 1. Multiple scattered enhancing marrow replacing bone lesions within the imaged spine and pelvis consistent with known history of multiple myeloma. No pathologic fracture or extraosseous soft tissue component. 2. Unchanged mild multilevel degenerative changes of the lumbar spine resulting in mild canal stenosis at L2-L3 and L3-L4.  3. Left greater than right subarticular recess stenosis and mild left foraminal stenosis at L5-S1.      02/25/2022 Imaging   PET  1. Decreased FDG avidity associated with the multifocal hypermetabolic osseous lesions, consistent with treatment response. 2. Unchanged pathologic  fracture of the T8 superior endplate. 3. Prominent bilateral cervical lymph nodes are similar in size to prior but now demonstrate mildly metabolic activity, nonspecific but favored to be reactive. Attention on follow-up imaging suggested 4. Hypermetabolic hyperplasia of the tonsils, also favored reactive.   05/22/2022 Bone Marrow Transplant   Patient underwent autologous stem cell transplant. 05/30/2022, neutropenic fever.   06/03/2022 engraftment 06/04/2022, G-CSF stopped.    08/21/2022 Imaging   PET  No evidence of hypermetabolic myeloma.      INTERVAL HISTORY [name] is a 39 y.o. male who has above history reviewed by me today presents for follow up visit for management of multiple myeloma Patient is status post autologous stem cell transplant.   He is doing well.he takes Revlimid  10mg  daily. He tolerates well. He endorses recent increase of alcohol intake during holiday time.  He denies fever, chills, nausea vomiting diarrhea. He gets immunization at [facility].    MEDICAL HISTORY:  Past Medical History:  Diagnosis Date   Alcohol use    Sciatica    Smoking     SURGICAL HISTORY: Past Surgical History:  Procedure Laterality Date   INGUINAL HERNIA REPAIR      SOCIAL HISTORY: Social History   Socioeconomic History   Marital status: Single    Spouse name: Not on file   Number of children: Not on file   Years of education: Not on file   Highest education level: Not on file  Occupational History   Not on file  Tobacco Use   Smoking status: Former    Current packs/day: 0.00    Average packs/day: 0.5 packs/day for 10.0 years (5.0 ttl pk-yrs)    Types: E-cigarettes, Cigarettes    Start date: 08/12/2011    Quit date: 08/11/2021    Years since quitting: 2.4   Smokeless tobacco: Never  Vaping Use   Vaping status: Every Day  Substance and Sexual Activity   Alcohol use: Not Currently    Alcohol/week: 24.0 standard drinks of alcohol    Types: 24 Cans of beer per  week    Comment: quit drinking approx 08/11/2021   Drug use: Never   Sexual activity: Not on file  Other Topics Concern   Not on file  Social History Narrative   Not on file   Social Drivers of Health   Tobacco Use: Medium Risk (01/25/2024)   Patient History    Smoking Tobacco Use: Former    Smokeless Tobacco Use: Never    Passive Exposure: Not on Actuary Strain: Low Risk  (05/30/2022)   Received from [facility]   Overall Financial Resource Strain (CARDIA)    Difficulty of Paying Living Expenses: Not hard at all  Food Insecurity: No Food Insecurity (05/30/2022)   [facility]   Epic    Within the past 12 months, you worried that your food would run out before you got the money to buy more.: Never true    Within the past 12 months, the food you bought just didn't last and you didn't have money to get more.: Never true  Transportation Needs: No Transportation Needs (06/01/2022)   Received from [facility] - Transportation    In the past 12 months, has lack of transportation kept you from medical appointments or from getting medications?: No    Lack of Transportation (Non-Medical): No  Physical Activity: Not on file  Stress: Stress Concern Present (01/13/2022)  Received from [facility]   [employer] of [employer] - Occupational Stress Questionnaire    Feeling of Stress : To some extent  Social Connections: Not on file  Intimate Partner Violence: Not on file  Depression [phone]): Low Risk (10/11/2023)   Depression (PHQ2-9)    PHQ-2 Score: 0  Alcohol Screen: Not on file  Housing: Low Risk  (02/22/2023)   Received from [facility]   Epic    In the last 12 months, was there a time when you were not able to pay the mortgage or rent on time?: No    In the past 12 months, how many times have you moved where you were living?: 1    At any time in  the past 12 months, were you homeless or living in a shelter (including now)?: No  Utilities: Not At Risk (05/30/2022)   Received from [facility] Utilities    Threatened with loss of utilities: No  Health Literacy: Not on file    FAMILY HISTORY: Family History  Problem Relation Age of Onset   Ovarian cancer Mother    Asthma Mother    Alcoholism Father    Throat cancer Maternal Uncle    Heart disease Maternal Uncle    Lung cancer Maternal Uncle    Lymphoma Maternal Uncle    Lung cancer Paternal Grandmother     ALLERGIES:  has no known allergies.  MEDICATIONS:  Current Outpatient Medications  Medication Sig Dispense Refill   acyclovir  (ZOVIRAX ) 400 MG tablet Take 400 mg by mouth 2 (two) times daily.     aspirin  EC 81 MG tablet Take 81 mg by mouth daily. Swallow whole.     CALCIUM /VITAMIN D3 GUMMIES 200-5 MG-MCG CHEW TAKE 2 TABLETS BY MOUTH EVERY DAY 60 tablet 5   Cyanocobalamin  1000 MCG SUBL Take 1,000 mcg by mouth.     lenalidomide  (REVLIMID ) 10 MG capsule TAKE 1 CAPSULE BY MOUTH 1 TIME A DAY 28 capsule 0   ondansetron  (ZOFRAN ) 8 MG tablet Take 1 tablet (8 mg total) by mouth 2 (two) times daily as needed for nausea or vomiting. 20 tablet 2   potassium chloride  SA (KLOR-CON  M20) 20 MEQ tablet Take 1 tablet (20 mEq total) by mouth daily. 90 tablet 0   senna-docusate (SENOKOT-S) 8.6-50 MG tablet Take 1 tablet by mouth daily. 60 tablet 2   No current facility-administered medications for this visit.    Review of Systems  Constitutional:  Negative for appetite change, chills, fatigue, fever and unexpected weight change.  HENT:   Negative for hearing loss, sore throat and voice change.   Eyes:  Negative for eye problems and icterus.  Respiratory:  Negative for chest tightness, cough and shortness of breath.   Cardiovascular:  Negative for chest pain and leg swelling.  Gastrointestinal:  Negative for abdominal distention and abdominal pain.  Endocrine:  Negative for hot flashes.  Genitourinary:  Negative for difficulty urinating, dysuria and frequency.   Musculoskeletal:  Negative for arthralgias and back pain.  Skin:  Negative for itching and rash.  Neurological:  Negative for light-headedness and numbness.  Hematological:  Negative for adenopathy. Does not bruise/bleed easily.  Psychiatric/Behavioral:  Negative for confusion.      PHYSICAL EXAMINATION: ECOG PERFORMANCE STATUS: 1 - Symptomatic but completely ambulatory  Vitals:   01/25/24 1425  BP: 106/78  Pulse: 95  Resp: 20  Temp: 97.6 F (36.4 C)  SpO2: 100%  Filed Weights   01/25/24 1425  Weight: 274 lb (124.3 kg)     Physical Exam Constitutional:      General: He is not in acute distress.    Appearance: He is obese. He is not diaphoretic.  HENT:     Head: Normocephalic and atraumatic.     Mouth/Throat:     Pharynx: No oropharyngeal exudate.  Eyes:     General: No scleral icterus. Cardiovascular:     Rate and Rhythm: Normal rate.     Heart sounds: No murmur heard. Pulmonary:     Effort: Pulmonary effort is normal. No respiratory distress.  Abdominal:     General: There is no distension.     Palpations: Abdomen is soft.  Musculoskeletal:        General: Normal range of motion.     Cervical back: Normal range of motion.  Skin:    General: Skin is warm and dry.     Findings: No erythema.  Neurological:     Mental Status: He is alert and oriented to person, place, and time. Mental status is at baseline.     Cranial Nerves: No cranial nerve deficit.     Motor: No abnormal muscle tone.  Psychiatric:        Mood and Affect: Mood and affect normal.      LABORATORY DATA:  I have reviewed the data as listed    Latest Ref Rng & Units 01/25/2024    2:10 PM 12/13/2023    9:42 AM 11/08/2023   10:43 AM  CBC  WBC 4.0 - 10.5 K/uL 6.5  5.8  6.7   Hemoglobin 13.0 - 17.0 g/dL 87.2  86.0  88.0   Hematocrit 39.0 - 52.0 % 37.7  41.6  35.3   Platelets 150 - 400  K/uL 250  271  229       Latest Ref Rng & Units 01/25/2024    2:12 PM 12/13/2023    9:42 AM 11/08/2023   10:43 AM  CMP  Glucose 70 - 99 mg/dL 896  98  895   BUN 6 - 20 mg/dL 13  10  13    Creatinine 0.61 - 1.24 mg/dL 8.94  8.92  8.96   Sodium 135 - 145 mmol/L 136  137  133   Potassium 3.5 - 5.1 mmol/L 4.2  4.1  3.4   Chloride 98 - 111 mmol/L 99  100  101   CO2 22 - 32 mmol/L 25  21  21    Calcium  8.9 - 10.3 mg/dL 9.6  89.7  8.9   Total Protein 6.5 - 8.1 g/dL 7.7  7.7  7.3   Total Bilirubin 0.0 - 1.2 mg/dL 0.8  0.4  0.8   Alkaline Phos 38 - 126 U/L 80  67  60   AST 15 - 41 U/L 76  44  62   ALT 0 - 44 U/L 113  69  68        RADIOGRAPHIC STUDIES: I have personally reviewed the radiological images as listed and agreed with the findings in the report. No results found.   "

## 2024-01-26 ENCOUNTER — Encounter: Payer: Self-pay | Admitting: Oncology

## 2024-01-26 ENCOUNTER — Other Ambulatory Visit: Payer: Self-pay

## 2024-01-26 DIAGNOSIS — R748 Abnormal levels of other serum enzymes: Secondary | ICD-10-CM

## 2024-01-26 LAB — KAPPA/LAMBDA LIGHT CHAINS
Kappa free light chain: 15.2 mg/L (ref 3.3–19.4)
Kappa, lambda light chain ratio: 0.67 (ref 0.26–1.65)
Lambda free light chains: 22.7 mg/L (ref 5.7–26.3)

## 2024-01-26 NOTE — Telephone Encounter (Signed)
-----   Message from Fonda KANDICE Sax, [place] sent at 01/26/2024  9:08 AM EST -----   ----- Message ----- From: Babara Call, MD Sent: 01/25/2024   9:42 PM EST To: Almarie JINNY Nett, RN; Fonda KANDICE Sax, CMA  Patient has increased liver enzymes.  Please arrange patient to get ultrasound right upper quadrant for further evaluation.  Thank you

## 2024-01-26 NOTE — Telephone Encounter (Signed)
 Called and left message with patient that  liver enzymes are increased and that Dr. Babara would like to get ultrasound of right upper quadrant. I have entered order for US  and will have scheduling to schedule and contact patient with appointment date and time.

## 2024-01-26 NOTE — Telephone Encounter (Signed)
 Scheduling will you please schedule US  RUQ and notify patient of appointment date and time.

## 2024-01-26 NOTE — Telephone Encounter (Signed)
-----   Message from Zelphia Cap, MD sent at 01/25/2024  9:42 PM EST ----- Patient has increased liver enzymes.  Please arrange patient to get ultrasound right upper quadrant for further evaluation.  Thank you

## 2024-01-28 LAB — MULTIPLE MYELOMA PANEL, SERUM
Albumin SerPl Elph-Mcnc: 4.1 g/dL (ref 2.9–4.4)
Albumin/Glob SerPl: 1.3 (ref 0.7–1.7)
Alpha 1: 0.2 g/dL (ref 0.0–0.4)
Alpha2 Glob SerPl Elph-Mcnc: 0.6 g/dL (ref 0.4–1.0)
B-Globulin SerPl Elph-Mcnc: 1.4 g/dL — ABNORMAL HIGH (ref 0.7–1.3)
Gamma Glob SerPl Elph-Mcnc: 1.1 g/dL (ref 0.4–1.8)
Globulin, Total: 3.2 g/dL (ref 2.2–3.9)
IgA: 173 mg/dL (ref 90–386)
IgG (Immunoglobin G), Serum: 1287 mg/dL (ref 603–1613)
IgM (Immunoglobulin M), Srm: 42 mg/dL (ref 20–172)
Total Protein ELP: 7.3 g/dL (ref 6.0–8.5)

## 2024-02-07 ENCOUNTER — Ambulatory Visit: Admission: RE | Admit: 2024-02-07 | Source: Ambulatory Visit

## 2024-02-07 ENCOUNTER — Ambulatory Visit: Admission: RE | Admit: 2024-02-07 | Discharge: 2024-02-07 | Attending: Oncology

## 2024-02-07 DIAGNOSIS — R748 Abnormal levels of other serum enzymes: Secondary | ICD-10-CM

## 2024-02-22 ENCOUNTER — Inpatient Hospital Stay

## 2024-02-22 ENCOUNTER — Inpatient Hospital Stay: Admitting: Oncology
